# Patient Record
Sex: Female | Born: 1940
Health system: Southern US, Community
[De-identification: ages and names within clinical notes are randomized; demographics above are authoritative.]

## PROBLEM LIST (undated history)

## (undated) DIAGNOSIS — C3490 Malignant neoplasm of unspecified part of unspecified bronchus or lung: Secondary | ICD-10-CM

## (undated) DIAGNOSIS — K219 Gastro-esophageal reflux disease without esophagitis: Secondary | ICD-10-CM

## (undated) DIAGNOSIS — Z7901 Long term (current) use of anticoagulants: Secondary | ICD-10-CM

## (undated) DIAGNOSIS — M1612 Unilateral primary osteoarthritis, left hip: Secondary | ICD-10-CM

## (undated) DIAGNOSIS — G4700 Insomnia, unspecified: Secondary | ICD-10-CM

## (undated) DIAGNOSIS — M5030 Other cervical disc degeneration, unspecified cervical region: Secondary | ICD-10-CM

## (undated) DIAGNOSIS — E785 Hyperlipidemia, unspecified: Secondary | ICD-10-CM

## (undated) DIAGNOSIS — Z9221 Personal history of antineoplastic chemotherapy: Secondary | ICD-10-CM

## (undated) DIAGNOSIS — C189 Malignant neoplasm of colon, unspecified: Secondary | ICD-10-CM

## (undated) DIAGNOSIS — I4891 Unspecified atrial fibrillation: Secondary | ICD-10-CM

## (undated) DIAGNOSIS — E119 Type 2 diabetes mellitus without complications: Secondary | ICD-10-CM

## (undated) DIAGNOSIS — I471 Supraventricular tachycardia, unspecified: Secondary | ICD-10-CM

## (undated) DIAGNOSIS — F331 Major depressive disorder, recurrent, moderate: Secondary | ICD-10-CM

## (undated) DIAGNOSIS — I499 Cardiac arrhythmia, unspecified: Secondary | ICD-10-CM

## (undated) DIAGNOSIS — Z923 Personal history of irradiation: Secondary | ICD-10-CM

## (undated) DIAGNOSIS — C159 Malignant neoplasm of esophagus, unspecified: Secondary | ICD-10-CM

## (undated) DIAGNOSIS — T7840XA Allergy, unspecified, initial encounter: Secondary | ICD-10-CM

## (undated) DIAGNOSIS — Z9289 Personal history of other medical treatment: Secondary | ICD-10-CM

## (undated) DIAGNOSIS — I10 Essential (primary) hypertension: Secondary | ICD-10-CM

## (undated) DIAGNOSIS — C801 Malignant (primary) neoplasm, unspecified: Secondary | ICD-10-CM

## (undated) HISTORY — PX: ABDOMINAL HYSTERECTOMY: SHX81

## (undated) HISTORY — DX: Gastro-esophageal reflux disease without esophagitis: K21.9

## (undated) HISTORY — DX: Hyperlipidemia, unspecified: E78.5

## (undated) HISTORY — PX: LOBECTOMY: SHX5089

## (undated) HISTORY — DX: Essential (primary) hypertension: I10

## (undated) HISTORY — DX: Allergy, unspecified, initial encounter: T78.40XA

## (undated) HISTORY — PX: COLECTOMY: SHX59

## (undated) HISTORY — PX: SINUSOTOMY: SHX291

## (undated) HISTORY — DX: Malignant (primary) neoplasm, unspecified: C80.1

## (undated) HISTORY — PX: ATRIAL FIBRILLATION ABLATION: EP1191

## (undated) HISTORY — PX: PARTIAL HYSTERECTOMY: SHX80

## (undated) HISTORY — PX: OTHER SURGICAL HISTORY: SHX169

## (undated) HISTORY — DX: Major depressive disorder, recurrent, moderate: F33.1

## (undated) HISTORY — DX: Insomnia, unspecified: G47.00

## (undated) HISTORY — PX: BUNIONECTOMY: SHX129

---

## 2003-11-25 ENCOUNTER — Other Ambulatory Visit: Payer: Self-pay

## 2004-06-02 ENCOUNTER — Other Ambulatory Visit: Payer: Self-pay

## 2004-06-03 ENCOUNTER — Other Ambulatory Visit: Payer: Self-pay

## 2005-03-30 ENCOUNTER — Emergency Department: Payer: Self-pay | Admitting: Emergency Medicine

## 2005-03-30 ENCOUNTER — Other Ambulatory Visit: Payer: Self-pay

## 2005-09-20 ENCOUNTER — Ambulatory Visit: Payer: Self-pay | Admitting: Internal Medicine

## 2005-10-18 ENCOUNTER — Other Ambulatory Visit: Payer: Self-pay

## 2005-10-18 ENCOUNTER — Inpatient Hospital Stay: Payer: Self-pay | Admitting: Internal Medicine

## 2006-03-07 ENCOUNTER — Emergency Department: Payer: Self-pay | Admitting: Unknown Physician Specialty

## 2006-04-25 ENCOUNTER — Ambulatory Visit: Payer: Self-pay | Admitting: Gastroenterology

## 2007-02-28 ENCOUNTER — Ambulatory Visit: Payer: Self-pay | Admitting: Family Medicine

## 2007-05-11 ENCOUNTER — Ambulatory Visit: Payer: Self-pay | Admitting: Emergency Medicine

## 2007-05-19 ENCOUNTER — Emergency Department: Payer: Self-pay | Admitting: Emergency Medicine

## 2008-04-24 ENCOUNTER — Ambulatory Visit: Payer: Self-pay | Admitting: Family Medicine

## 2009-07-06 ENCOUNTER — Ambulatory Visit: Payer: Self-pay | Admitting: Family Medicine

## 2009-08-12 ENCOUNTER — Ambulatory Visit: Payer: Self-pay | Admitting: Gastroenterology

## 2010-01-05 ENCOUNTER — Ambulatory Visit: Payer: Self-pay | Admitting: Family Medicine

## 2010-07-15 ENCOUNTER — Ambulatory Visit: Payer: Self-pay | Admitting: Family Medicine

## 2010-07-22 ENCOUNTER — Ambulatory Visit: Payer: Self-pay | Admitting: Family Medicine

## 2011-01-17 ENCOUNTER — Ambulatory Visit: Payer: Self-pay

## 2011-01-19 ENCOUNTER — Ambulatory Visit: Payer: Self-pay | Admitting: Podiatry

## 2011-01-22 ENCOUNTER — Observation Stay: Payer: Self-pay | Admitting: Internal Medicine

## 2011-09-20 ENCOUNTER — Ambulatory Visit: Payer: Self-pay | Admitting: Family Medicine

## 2012-01-11 ENCOUNTER — Inpatient Hospital Stay: Payer: Self-pay | Admitting: Student

## 2012-01-11 ENCOUNTER — Ambulatory Visit: Payer: Self-pay | Admitting: Family Medicine

## 2012-01-11 LAB — CBC
HCT: 41.8 % (ref 35.0–47.0)
HGB: 14.1 g/dL (ref 12.0–16.0)
MCV: 96 fL (ref 80–100)
Platelet: 248 10*3/uL (ref 150–440)
RDW: 13.4 % (ref 11.5–14.5)
WBC: 7.2 10*3/uL (ref 3.6–11.0)

## 2012-01-11 LAB — COMPREHENSIVE METABOLIC PANEL
Albumin: 4 g/dL (ref 3.4–5.0)
Alkaline Phosphatase: 64 U/L (ref 50–136)
Anion Gap: 12 (ref 7–16)
BUN: 20 mg/dL — ABNORMAL HIGH (ref 7–18)
Bilirubin,Total: 0.3 mg/dL (ref 0.2–1.0)
Calcium, Total: 8.7 mg/dL (ref 8.5–10.1)
Creatinine: 0.7 mg/dL (ref 0.60–1.30)
Glucose: 110 mg/dL — ABNORMAL HIGH (ref 65–99)
Osmolality: 286 (ref 275–301)
Potassium: 3.6 mmol/L (ref 3.5–5.1)
SGPT (ALT): 26 U/L
Sodium: 142 mmol/L (ref 136–145)
Total Protein: 8 g/dL (ref 6.4–8.2)

## 2012-01-11 LAB — TROPONIN I: Troponin-I: <0.02 ng/mL

## 2012-01-11 LAB — APTT: Activated PTT: 34.6 secs (ref 23.6–35.9)

## 2012-01-11 LAB — PROTIME-INR: INR: 1

## 2012-01-12 LAB — CBC WITH DIFFERENTIAL/PLATELET
Basophil #: 0 10*3/uL (ref 0.0–0.1)
Basophil %: 0.6 %
Eosinophil #: 0.5 10*3/uL (ref 0.0–0.7)
Lymphocyte %: 36.1 %
MCH: 32.4 pg (ref 26.0–34.0)
MCHC: 33.9 g/dL (ref 32.0–36.0)
Monocyte #: 0.6 10*3/uL (ref 0.0–0.7)
Monocyte %: 10.2 %
Neutrophil %: 44 %
Platelet: 227 10*3/uL (ref 150–440)
RDW: 13.1 % (ref 11.5–14.5)
WBC: 5.9 10*3/uL (ref 3.6–11.0)

## 2012-01-12 LAB — TROPONIN I
Troponin-I: <0.02 ng/mL
Troponin-I: <0.02 ng/mL

## 2012-01-12 LAB — BASIC METABOLIC PANEL
Anion Gap: 11 (ref 7–16)
BUN: 21 mg/dL — ABNORMAL HIGH (ref 7–18)
Chloride: 102 mmol/L (ref 98–107)
Co2: 30 mmol/L (ref 21–32)
Creatinine: 0.82 mg/dL (ref 0.60–1.30)
Potassium: 3.6 mmol/L (ref 3.5–5.1)

## 2012-01-12 LAB — LIPID PANEL: HDL Cholesterol: 63 mg/dL — ABNORMAL HIGH (ref 40–60)

## 2012-09-26 ENCOUNTER — Ambulatory Visit: Payer: Self-pay | Admitting: Family Medicine

## 2012-09-28 ENCOUNTER — Ambulatory Visit: Payer: Self-pay | Admitting: Family Medicine

## 2013-08-06 ENCOUNTER — Ambulatory Visit: Payer: Self-pay | Admitting: Family Medicine

## 2013-08-16 ENCOUNTER — Ambulatory Visit: Payer: Self-pay | Admitting: Family Medicine

## 2013-10-23 ENCOUNTER — Ambulatory Visit: Payer: Self-pay | Admitting: Family Medicine

## 2014-02-25 ENCOUNTER — Ambulatory Visit: Payer: Self-pay | Admitting: Gastroenterology

## 2014-03-04 ENCOUNTER — Ambulatory Visit: Payer: Self-pay | Admitting: Gastroenterology

## 2014-03-05 LAB — PATHOLOGY REPORT

## 2014-03-06 ENCOUNTER — Ambulatory Visit: Payer: Self-pay | Admitting: Oncology

## 2014-03-07 ENCOUNTER — Ambulatory Visit: Payer: Self-pay | Admitting: Oncology

## 2014-03-10 LAB — COMPREHENSIVE METABOLIC PANEL
ALK PHOS: 70 U/L
ALT: 25 U/L (ref 12–78)
Albumin: 4 g/dL (ref 3.4–5.0)
Anion Gap: 3 — ABNORMAL LOW (ref 7–16)
BUN: 16 mg/dL (ref 7–18)
Bilirubin,Total: 0.3 mg/dL (ref 0.2–1.0)
CALCIUM: 9.1 mg/dL (ref 8.5–10.1)
CHLORIDE: 102 mmol/L (ref 98–107)
CREATININE: 0.86 mg/dL (ref 0.60–1.30)
Co2: 32 mmol/L (ref 21–32)
EGFR (African American): >60
EGFR (Non-African Amer.): >60
Glucose: 96 mg/dL (ref 65–99)
Osmolality: 275 (ref 275–301)
Potassium: 3.8 mmol/L (ref 3.5–5.1)
SGOT(AST): 28 U/L (ref 15–37)
SODIUM: 137 mmol/L (ref 136–145)
Total Protein: 8 g/dL (ref 6.4–8.2)

## 2014-03-10 LAB — CBC CANCER CENTER
BASOS ABS: 0.1 x10 3/mm (ref 0.0–0.1)
Basophil %: 1.2 %
Eosinophil #: 0.3 x10 3/mm (ref 0.0–0.7)
Eosinophil %: 4.6 %
HCT: 41.8 % (ref 35.0–47.0)
HGB: 14.1 g/dL (ref 12.0–16.0)
LYMPHS ABS: 2.4 x10 3/mm (ref 1.0–3.6)
Lymphocyte %: 32.1 %
MCH: 32.1 pg (ref 26.0–34.0)
MCHC: 33.7 g/dL (ref 32.0–36.0)
MCV: 95 fL (ref 80–100)
MONO ABS: 0.6 x10 3/mm (ref 0.2–0.9)
MONOS PCT: 7.5 %
NEUTROS ABS: 4.2 x10 3/mm (ref 1.4–6.5)
Neutrophil %: 54.6 %
PLATELETS: 252 x10 3/mm (ref 150–440)
RBC: 4.38 10*6/uL (ref 3.80–5.20)
RDW: 13.2 % (ref 11.5–14.5)
WBC: 7.6 x10 3/mm (ref 3.6–11.0)

## 2014-03-21 ENCOUNTER — Ambulatory Visit: Payer: Self-pay | Admitting: Oncology

## 2014-03-24 ENCOUNTER — Ambulatory Visit: Payer: Self-pay | Admitting: Oncology

## 2014-03-31 ENCOUNTER — Ambulatory Visit: Payer: Self-pay | Admitting: Vascular Surgery

## 2014-04-02 LAB — CBC CANCER CENTER
Basophil #: 0.1 x10 3/mm (ref 0.0–0.1)
Basophil %: 1.3 %
EOS PCT: 8.2 %
Eosinophil #: 0.5 x10 3/mm (ref 0.0–0.7)
HCT: 37.2 % (ref 35.0–47.0)
HGB: 12.5 g/dL (ref 12.0–16.0)
LYMPHS PCT: 27.7 %
Lymphocyte #: 1.7 x10 3/mm (ref 1.0–3.6)
MCH: 31.8 pg (ref 26.0–34.0)
MCHC: 33.6 g/dL (ref 32.0–36.0)
MCV: 95 fL (ref 80–100)
MONOS PCT: 9.5 %
Monocyte #: 0.6 x10 3/mm (ref 0.2–0.9)
Neutrophil #: 3.2 x10 3/mm (ref 1.4–6.5)
Neutrophil %: 53.3 %
Platelet: 241 x10 3/mm (ref 150–440)
RBC: 3.94 10*6/uL (ref 3.80–5.20)
RDW: 13 % (ref 11.5–14.5)
WBC: 6 x10 3/mm (ref 3.6–11.0)

## 2014-04-02 LAB — COMPREHENSIVE METABOLIC PANEL
ALBUMIN: 3.5 g/dL (ref 3.4–5.0)
ALT: 21 U/L (ref 12–78)
ANION GAP: 8 (ref 7–16)
Alkaline Phosphatase: 67 U/L
BILIRUBIN TOTAL: 0.3 mg/dL (ref 0.2–1.0)
BUN: 16 mg/dL (ref 7–18)
CHLORIDE: 104 mmol/L (ref 98–107)
CREATININE: 0.81 mg/dL (ref 0.60–1.30)
Calcium, Total: 8.3 mg/dL — ABNORMAL LOW (ref 8.5–10.1)
Co2: 30 mmol/L (ref 21–32)
EGFR (African American): >60
GLUCOSE: 111 mg/dL — AB (ref 65–99)
Osmolality: 285 (ref 275–301)
POTASSIUM: 3.5 mmol/L (ref 3.5–5.1)
SGOT(AST): 17 U/L (ref 15–37)
SODIUM: 142 mmol/L (ref 136–145)
Total Protein: 7 g/dL (ref 6.4–8.2)

## 2014-04-03 ENCOUNTER — Ambulatory Visit: Payer: Self-pay | Admitting: Internal Medicine

## 2014-04-09 LAB — COMPREHENSIVE METABOLIC PANEL
ALT: 23 U/L (ref 12–78)
ANION GAP: 5 — AB (ref 7–16)
Albumin: 3.4 g/dL (ref 3.4–5.0)
Alkaline Phosphatase: 63 U/L
BUN: 18 mg/dL (ref 7–18)
Bilirubin,Total: 0.4 mg/dL (ref 0.2–1.0)
CREATININE: 0.73 mg/dL (ref 0.60–1.30)
Calcium, Total: 8.8 mg/dL (ref 8.5–10.1)
Chloride: 102 mmol/L (ref 98–107)
Co2: 32 mmol/L (ref 21–32)
EGFR (African American): >60
EGFR (Non-African Amer.): >60
GLUCOSE: 103 mg/dL — AB (ref 65–99)
OSMOLALITY: 280 (ref 275–301)
POTASSIUM: 4 mmol/L (ref 3.5–5.1)
SGOT(AST): 14 U/L — ABNORMAL LOW (ref 15–37)
SODIUM: 139 mmol/L (ref 136–145)
Total Protein: 7 g/dL (ref 6.4–8.2)

## 2014-04-09 LAB — CBC CANCER CENTER
BASOS ABS: 0.1 x10 3/mm (ref 0.0–0.1)
Basophil %: 1.3 %
EOS ABS: 0.5 x10 3/mm (ref 0.0–0.7)
Eosinophil %: 10.7 %
HCT: 36.2 % (ref 35.0–47.0)
HGB: 12.4 g/dL (ref 12.0–16.0)
LYMPHS ABS: 1.5 x10 3/mm (ref 1.0–3.6)
Lymphocyte %: 35.3 %
MCH: 31.9 pg (ref 26.0–34.0)
MCHC: 34.1 g/dL (ref 32.0–36.0)
MCV: 94 fL (ref 80–100)
MONO ABS: 0.2 x10 3/mm (ref 0.2–0.9)
MONOS PCT: 5.8 %
NEUTROS ABS: 2 x10 3/mm (ref 1.4–6.5)
NEUTROS PCT: 46.9 %
PLATELETS: 227 x10 3/mm (ref 150–440)
RBC: 3.88 10*6/uL (ref 3.80–5.20)
RDW: 12.9 % (ref 11.5–14.5)
WBC: 4.3 x10 3/mm (ref 3.6–11.0)

## 2014-04-16 LAB — BASIC METABOLIC PANEL
ANION GAP: 8 (ref 7–16)
BUN: 24 mg/dL — ABNORMAL HIGH (ref 7–18)
Calcium, Total: 9 mg/dL (ref 8.5–10.1)
Chloride: 104 mmol/L (ref 98–107)
Co2: 28 mmol/L (ref 21–32)
Creatinine: 0.75 mg/dL (ref 0.60–1.30)
EGFR (African American): >60
EGFR (Non-African Amer.): >60
GLUCOSE: 110 mg/dL — AB (ref 65–99)
OSMOLALITY: 284 (ref 275–301)
POTASSIUM: 3.4 mmol/L — AB (ref 3.5–5.1)
Sodium: 140 mmol/L (ref 136–145)

## 2014-04-16 LAB — CBC WITH DIFFERENTIAL/PLATELET
BASOS ABS: 0 10*3/uL (ref 0.0–0.1)
Basophil %: 1.4 %
Eosinophil #: 0.3 10*3/uL (ref 0.0–0.7)
Eosinophil %: 9.9 %
HCT: 38 % (ref 35.0–47.0)
HGB: 12.6 g/dL (ref 12.0–16.0)
LYMPHS ABS: 1.2 10*3/uL (ref 1.0–3.6)
Lymphocyte %: 35.4 %
MCH: 31.7 pg (ref 26.0–34.0)
MCHC: 33.2 g/dL (ref 32.0–36.0)
MCV: 96 fL (ref 80–100)
MONOS PCT: 8.5 %
Monocyte #: 0.3 x10 3/mm (ref 0.2–0.9)
NEUTROS ABS: 1.5 10*3/uL (ref 1.4–6.5)
Neutrophil %: 44.8 %
PLATELETS: 229 10*3/uL (ref 150–440)
RBC: 3.98 10*6/uL (ref 3.80–5.20)
RDW: 12.8 % (ref 11.5–14.5)
WBC: 3.4 10*3/uL — ABNORMAL LOW (ref 3.6–11.0)

## 2014-04-21 ENCOUNTER — Ambulatory Visit: Payer: Self-pay | Admitting: Oncology

## 2014-04-23 LAB — CBC CANCER CENTER
Basophil #: 0 x10 3/mm (ref 0.0–0.1)
Basophil %: 0.4 %
Eosinophil #: 0 x10 3/mm (ref 0.0–0.7)
Eosinophil %: 0 %
HCT: 34.9 % — ABNORMAL LOW (ref 35.0–47.0)
HGB: 11.9 g/dL — ABNORMAL LOW (ref 12.0–16.0)
LYMPHS ABS: 0.6 x10 3/mm — AB (ref 1.0–3.6)
Lymphocyte %: 14.9 %
MCH: 32.4 pg (ref 26.0–34.0)
MCHC: 34.1 g/dL (ref 32.0–36.0)
MCV: 95 fL (ref 80–100)
MONO ABS: 0.3 x10 3/mm (ref 0.2–0.9)
Monocyte %: 6.1 %
NEUTROS ABS: 3.4 x10 3/mm (ref 1.4–6.5)
Neutrophil %: 78.6 %
PLATELETS: 221 x10 3/mm (ref 150–440)
RBC: 3.67 10*6/uL — ABNORMAL LOW (ref 3.80–5.20)
RDW: 12.7 % (ref 11.5–14.5)
WBC: 4.3 x10 3/mm (ref 3.6–11.0)

## 2014-04-23 LAB — COMPREHENSIVE METABOLIC PANEL
ALBUMIN: 3.6 g/dL (ref 3.4–5.0)
ANION GAP: 6 — AB (ref 7–16)
Alkaline Phosphatase: 69 U/L
BUN: 20 mg/dL — AB (ref 7–18)
Bilirubin,Total: 0.4 mg/dL (ref 0.2–1.0)
CREATININE: 0.75 mg/dL (ref 0.60–1.30)
Calcium, Total: 9 mg/dL (ref 8.5–10.1)
Chloride: 102 mmol/L (ref 98–107)
Co2: 32 mmol/L (ref 21–32)
EGFR (African American): >60
Glucose: 129 mg/dL — ABNORMAL HIGH (ref 65–99)
Osmolality: 284 (ref 275–301)
POTASSIUM: 3.8 mmol/L (ref 3.5–5.1)
SGOT(AST): 12 U/L — ABNORMAL LOW (ref 15–37)
SGPT (ALT): 22 U/L (ref 12–78)
SODIUM: 140 mmol/L (ref 136–145)
Total Protein: 7.2 g/dL (ref 6.4–8.2)

## 2014-04-30 LAB — COMPREHENSIVE METABOLIC PANEL
ALK PHOS: 59 U/L
AST: 13 U/L — AB (ref 15–37)
Albumin: 3.2 g/dL — ABNORMAL LOW (ref 3.4–5.0)
Anion Gap: 5 — ABNORMAL LOW (ref 7–16)
BILIRUBIN TOTAL: 0.3 mg/dL (ref 0.2–1.0)
BUN: 29 mg/dL — ABNORMAL HIGH (ref 7–18)
Calcium, Total: 8.7 mg/dL (ref 8.5–10.1)
Chloride: 103 mmol/L (ref 98–107)
Co2: 31 mmol/L (ref 21–32)
Creatinine: 0.89 mg/dL (ref 0.60–1.30)
EGFR (African American): >60
EGFR (Non-African Amer.): >60
Glucose: 95 mg/dL (ref 65–99)
OSMOLALITY: 283 (ref 275–301)
Potassium: 3.9 mmol/L (ref 3.5–5.1)
SGPT (ALT): 33 U/L (ref 12–78)
Sodium: 139 mmol/L (ref 136–145)
TOTAL PROTEIN: 6.5 g/dL (ref 6.4–8.2)

## 2014-04-30 LAB — CBC CANCER CENTER
Basophil #: 0 x10 3/mm (ref 0.0–0.1)
Basophil %: 0.1 %
EOS ABS: 0 x10 3/mm (ref 0.0–0.7)
EOS PCT: 1 %
HCT: 35.6 % (ref 35.0–47.0)
HGB: 11.8 g/dL — AB (ref 12.0–16.0)
LYMPHS PCT: 26.6 %
Lymphocyte #: 1.1 x10 3/mm (ref 1.0–3.6)
MCH: 31.9 pg (ref 26.0–34.0)
MCHC: 33.2 g/dL (ref 32.0–36.0)
MCV: 96 fL (ref 80–100)
MONO ABS: 0.9 x10 3/mm (ref 0.2–0.9)
MONOS PCT: 22.4 %
NEUTROS ABS: 2 x10 3/mm (ref 1.4–6.5)
Neutrophil %: 49.9 %
Platelet: 158 x10 3/mm (ref 150–440)
RBC: 3.7 10*6/uL — ABNORMAL LOW (ref 3.80–5.20)
RDW: 13.9 % (ref 11.5–14.5)
WBC: 4.1 x10 3/mm (ref 3.6–11.0)

## 2014-05-07 LAB — COMPREHENSIVE METABOLIC PANEL
ALBUMIN: 3.2 g/dL — AB (ref 3.4–5.0)
ALK PHOS: 63 U/L
Anion Gap: 5 — ABNORMAL LOW (ref 7–16)
BUN: 21 mg/dL — ABNORMAL HIGH (ref 7–18)
Bilirubin,Total: 0.3 mg/dL (ref 0.2–1.0)
Calcium, Total: 8.7 mg/dL (ref 8.5–10.1)
Chloride: 105 mmol/L (ref 98–107)
Co2: 32 mmol/L (ref 21–32)
Creatinine: 0.76 mg/dL (ref 0.60–1.30)
EGFR (African American): >60
EGFR (Non-African Amer.): >60
GLUCOSE: 119 mg/dL — AB (ref 65–99)
Osmolality: 287 (ref 275–301)
POTASSIUM: 3.9 mmol/L (ref 3.5–5.1)
SGOT(AST): 12 U/L — ABNORMAL LOW (ref 15–37)
SGPT (ALT): 25 U/L (ref 12–78)
SODIUM: 142 mmol/L (ref 136–145)
TOTAL PROTEIN: 6.6 g/dL (ref 6.4–8.2)

## 2014-05-07 LAB — CBC CANCER CENTER
BASOS PCT: 1 %
Basophil #: 0 x10 3/mm (ref 0.0–0.1)
EOS ABS: 0.1 x10 3/mm (ref 0.0–0.7)
Eosinophil %: 2.2 %
HCT: 34.7 % — ABNORMAL LOW (ref 35.0–47.0)
HGB: 11.8 g/dL — AB (ref 12.0–16.0)
Lymphocyte #: 0.5 x10 3/mm — ABNORMAL LOW (ref 1.0–3.6)
Lymphocyte %: 16.8 %
MCH: 32.9 pg (ref 26.0–34.0)
MCHC: 34.1 g/dL (ref 32.0–36.0)
MCV: 97 fL (ref 80–100)
Monocyte #: 0.3 x10 3/mm (ref 0.2–0.9)
Monocyte %: 10.4 %
Neutrophil #: 2 x10 3/mm (ref 1.4–6.5)
Neutrophil %: 69.6 %
Platelet: 115 x10 3/mm — ABNORMAL LOW (ref 150–440)
RBC: 3.6 10*6/uL — AB (ref 3.80–5.20)
RDW: 13.9 % (ref 11.5–14.5)
WBC: 2.8 x10 3/mm — AB (ref 3.6–11.0)

## 2014-05-14 LAB — CBC CANCER CENTER
Basophil #: 0 x10 3/mm (ref 0.0–0.1)
Basophil %: 0.6 %
EOS ABS: 0.1 x10 3/mm (ref 0.0–0.7)
Eosinophil %: 2.4 %
HCT: 34.7 % — AB (ref 35.0–47.0)
HGB: 11.9 g/dL — AB (ref 12.0–16.0)
Lymphocyte #: 0.8 x10 3/mm — ABNORMAL LOW (ref 1.0–3.6)
Lymphocyte %: 24.3 %
MCH: 32.6 pg (ref 26.0–34.0)
MCHC: 34.3 g/dL (ref 32.0–36.0)
MCV: 95 fL (ref 80–100)
MONOS PCT: 8.3 %
Monocyte #: 0.3 x10 3/mm (ref 0.2–0.9)
NEUTROS ABS: 2.1 x10 3/mm (ref 1.4–6.5)
NEUTROS PCT: 64.4 %
PLATELETS: 161 x10 3/mm (ref 150–440)
RBC: 3.65 10*6/uL — ABNORMAL LOW (ref 3.80–5.20)
RDW: 14.3 % (ref 11.5–14.5)
WBC: 3.2 x10 3/mm — AB (ref 3.6–11.0)

## 2014-05-14 LAB — COMPREHENSIVE METABOLIC PANEL
ANION GAP: 7 (ref 7–16)
Albumin: 3.4 g/dL (ref 3.4–5.0)
Alkaline Phosphatase: 58 U/L
BILIRUBIN TOTAL: 0.4 mg/dL (ref 0.2–1.0)
BUN: 20 mg/dL — ABNORMAL HIGH (ref 7–18)
CHLORIDE: 101 mmol/L (ref 98–107)
Calcium, Total: 9.1 mg/dL (ref 8.5–10.1)
Co2: 29 mmol/L (ref 21–32)
Creatinine: 0.94 mg/dL (ref 0.60–1.30)
EGFR (African American): >60
EGFR (Non-African Amer.): >60
Glucose: 119 mg/dL — ABNORMAL HIGH (ref 65–99)
Osmolality: 278 (ref 275–301)
POTASSIUM: 3.3 mmol/L — AB (ref 3.5–5.1)
SGOT(AST): 18 U/L (ref 15–37)
SGPT (ALT): 24 U/L (ref 12–78)
Sodium: 137 mmol/L (ref 136–145)
Total Protein: 6.8 g/dL (ref 6.4–8.2)

## 2014-05-21 ENCOUNTER — Ambulatory Visit: Payer: Self-pay | Admitting: Oncology

## 2014-05-21 LAB — COMPREHENSIVE METABOLIC PANEL
ALT: 26 U/L (ref 12–78)
Albumin: 3.5 g/dL (ref 3.4–5.0)
Alkaline Phosphatase: 57 U/L
Anion Gap: 8 (ref 7–16)
BUN: 34 mg/dL — ABNORMAL HIGH (ref 7–18)
Bilirubin,Total: 0.3 mg/dL (ref 0.2–1.0)
Calcium, Total: 9 mg/dL (ref 8.5–10.1)
Chloride: 100 mmol/L (ref 98–107)
Co2: 29 mmol/L (ref 21–32)
Creatinine: 1.18 mg/dL (ref 0.60–1.30)
EGFR (African American): 53 — ABNORMAL LOW
EGFR (Non-African Amer.): 46 — ABNORMAL LOW
Glucose: 108 mg/dL — ABNORMAL HIGH (ref 65–99)
Osmolality: 282 (ref 275–301)
Potassium: 4.1 mmol/L (ref 3.5–5.1)
SGOT(AST): 13 U/L — ABNORMAL LOW (ref 15–37)
Sodium: 137 mmol/L (ref 136–145)
Total Protein: 6.9 g/dL (ref 6.4–8.2)

## 2014-05-21 LAB — CBC CANCER CENTER
Basophil #: 0 x10 3/mm (ref 0.0–0.1)
Basophil %: 0.1 %
Eosinophil #: 0 x10 3/mm (ref 0.0–0.7)
Eosinophil %: 0 %
HCT: 33.5 % — AB (ref 35.0–47.0)
HGB: 11.5 g/dL — ABNORMAL LOW (ref 12.0–16.0)
Lymphocyte #: 0.3 x10 3/mm — ABNORMAL LOW (ref 1.0–3.6)
Lymphocyte %: 6.8 %
MCH: 32.9 pg (ref 26.0–34.0)
MCHC: 34.2 g/dL (ref 32.0–36.0)
MCV: 96 fL (ref 80–100)
Monocyte #: 0.5 x10 3/mm (ref 0.2–0.9)
Monocyte %: 14.4 %
Neutrophil #: 3 x10 3/mm (ref 1.4–6.5)
Neutrophil %: 78.7 %
Platelet: 223 x10 3/mm (ref 150–440)
RBC: 3.48 10*6/uL — ABNORMAL LOW (ref 3.80–5.20)
RDW: 14.8 % — AB (ref 11.5–14.5)
WBC: 3.8 x10 3/mm (ref 3.6–11.0)

## 2014-05-24 ENCOUNTER — Emergency Department: Payer: Self-pay | Admitting: Emergency Medicine

## 2014-05-24 LAB — COMPREHENSIVE METABOLIC PANEL
ALK PHOS: 66 U/L
ANION GAP: 9 (ref 7–16)
Albumin: 3.4 g/dL (ref 3.4–5.0)
BILIRUBIN TOTAL: 0.5 mg/dL (ref 0.2–1.0)
BUN: 30 mg/dL — ABNORMAL HIGH (ref 7–18)
Calcium, Total: 8.5 mg/dL (ref 8.5–10.1)
Chloride: 100 mmol/L (ref 98–107)
Co2: 25 mmol/L (ref 21–32)
Creatinine: 1.1 mg/dL (ref 0.60–1.30)
EGFR (African American): 58 — ABNORMAL LOW
GFR CALC NON AF AMER: 50 — AB
Glucose: 115 mg/dL — ABNORMAL HIGH (ref 65–99)
Osmolality: 275 (ref 275–301)
Potassium: 3.7 mmol/L (ref 3.5–5.1)
SGOT(AST): 26 U/L (ref 15–37)
SGPT (ALT): 30 U/L (ref 12–78)
Sodium: 134 mmol/L — ABNORMAL LOW (ref 136–145)
Total Protein: 7 g/dL (ref 6.4–8.2)

## 2014-05-24 LAB — CBC
HCT: 33.4 % — AB (ref 35.0–47.0)
HGB: 11.4 g/dL — ABNORMAL LOW (ref 12.0–16.0)
MCH: 33.7 pg (ref 26.0–34.0)
MCHC: 34.2 g/dL (ref 32.0–36.0)
MCV: 98 fL (ref 80–100)
PLATELETS: 129 10*3/uL — AB (ref 150–440)
RBC: 3.39 10*6/uL — AB (ref 3.80–5.20)
RDW: 14.8 % — ABNORMAL HIGH (ref 11.5–14.5)
WBC: 4.3 10*3/uL (ref 3.6–11.0)

## 2014-05-24 LAB — TROPONIN I: Troponin-I: <0.02 ng/mL

## 2014-05-28 LAB — COMPREHENSIVE METABOLIC PANEL
Albumin: 3.4 g/dL (ref 3.4–5.0)
Alkaline Phosphatase: 65 U/L
Anion Gap: 7 (ref 7–16)
BILIRUBIN TOTAL: 0.4 mg/dL (ref 0.2–1.0)
BUN: 28 mg/dL — ABNORMAL HIGH (ref 7–18)
CALCIUM: 8.7 mg/dL (ref 8.5–10.1)
CREATININE: 1.29 mg/dL (ref 0.60–1.30)
Chloride: 100 mmol/L (ref 98–107)
Co2: 32 mmol/L (ref 21–32)
EGFR (Non-African Amer.): 41 — ABNORMAL LOW
GFR CALC AF AMER: 47 — AB
Glucose: 119 mg/dL — ABNORMAL HIGH (ref 65–99)
OSMOLALITY: 284 (ref 275–301)
Potassium: 3.5 mmol/L (ref 3.5–5.1)
SGOT(AST): 15 U/L (ref 15–37)
SGPT (ALT): 30 U/L (ref 12–78)
Sodium: 139 mmol/L (ref 136–145)
TOTAL PROTEIN: 6.9 g/dL (ref 6.4–8.2)

## 2014-05-28 LAB — CBC CANCER CENTER
BASOS PCT: 0.1 %
Basophil #: 0 x10 3/mm (ref 0.0–0.1)
EOS ABS: 0 x10 3/mm (ref 0.0–0.7)
EOS PCT: 0.1 %
HCT: 34.3 % — ABNORMAL LOW (ref 35.0–47.0)
HGB: 11.8 g/dL — ABNORMAL LOW (ref 12.0–16.0)
LYMPHS ABS: 0.4 x10 3/mm — AB (ref 1.0–3.6)
Lymphocyte %: 8.4 %
MCH: 33.3 pg (ref 26.0–34.0)
MCHC: 34.3 g/dL (ref 32.0–36.0)
MCV: 97 fL (ref 80–100)
MONOS PCT: 20 %
Monocyte #: 1 x10 3/mm — ABNORMAL HIGH (ref 0.2–0.9)
NEUTROS PCT: 71.4 %
Neutrophil #: 3.6 x10 3/mm (ref 1.4–6.5)
Platelet: 169 x10 3/mm (ref 150–440)
RBC: 3.54 10*6/uL — ABNORMAL LOW (ref 3.80–5.20)
RDW: 15.8 % — ABNORMAL HIGH (ref 11.5–14.5)
WBC: 5.1 x10 3/mm (ref 3.6–11.0)

## 2014-06-04 LAB — CBC CANCER CENTER
Basophil #: 0 x10 3/mm (ref 0.0–0.1)
Basophil %: 0.7 %
EOS ABS: 0 x10 3/mm (ref 0.0–0.7)
Eosinophil %: 0.6 %
HCT: 33.7 % — ABNORMAL LOW (ref 35.0–47.0)
HGB: 11.4 g/dL — AB (ref 12.0–16.0)
LYMPHS ABS: 0.6 x10 3/mm — AB (ref 1.0–3.6)
Lymphocyte %: 9.5 %
MCH: 33.3 pg (ref 26.0–34.0)
MCHC: 33.8 g/dL (ref 32.0–36.0)
MCV: 99 fL (ref 80–100)
MONO ABS: 0.8 x10 3/mm (ref 0.2–0.9)
MONOS PCT: 13.9 %
NEUTROS ABS: 4.6 x10 3/mm (ref 1.4–6.5)
NEUTROS PCT: 75.3 %
PLATELETS: 169 x10 3/mm (ref 150–440)
RBC: 3.42 10*6/uL — ABNORMAL LOW (ref 3.80–5.20)
RDW: 17 % — AB (ref 11.5–14.5)
WBC: 6.1 x10 3/mm (ref 3.6–11.0)

## 2014-06-04 LAB — COMPREHENSIVE METABOLIC PANEL
ANION GAP: 9 (ref 7–16)
Albumin: 2.9 g/dL — ABNORMAL LOW (ref 3.4–5.0)
Alkaline Phosphatase: 63 U/L
BUN: 13 mg/dL (ref 7–18)
Bilirubin,Total: 0.4 mg/dL (ref 0.2–1.0)
CO2: 28 mmol/L (ref 21–32)
Calcium, Total: 8.3 mg/dL — ABNORMAL LOW (ref 8.5–10.1)
Chloride: 107 mmol/L (ref 98–107)
Creatinine: 0.73 mg/dL (ref 0.60–1.30)
EGFR (African American): >60
GLUCOSE: 112 mg/dL — AB (ref 65–99)
Osmolality: 288 (ref 275–301)
POTASSIUM: 3.2 mmol/L — AB (ref 3.5–5.1)
SGOT(AST): 15 U/L (ref 15–37)
SGPT (ALT): 22 U/L (ref 12–78)
Sodium: 144 mmol/L (ref 136–145)
Total Protein: 6.2 g/dL — ABNORMAL LOW (ref 6.4–8.2)

## 2014-06-09 LAB — CBC CANCER CENTER
BASOS ABS: 0 x10 3/mm (ref 0.0–0.1)
BASOS PCT: 0.5 %
EOS PCT: 0.8 %
Eosinophil #: 0 x10 3/mm (ref 0.0–0.7)
HCT: 32.4 % — ABNORMAL LOW (ref 35.0–47.0)
HGB: 10.8 g/dL — ABNORMAL LOW (ref 12.0–16.0)
LYMPHS ABS: 0.7 x10 3/mm — AB (ref 1.0–3.6)
Lymphocyte %: 13 %
MCH: 32.7 pg (ref 26.0–34.0)
MCHC: 33.3 g/dL (ref 32.0–36.0)
MCV: 98 fL (ref 80–100)
MONO ABS: 0.7 x10 3/mm (ref 0.2–0.9)
Monocyte %: 11.8 %
NEUTROS ABS: 4.1 x10 3/mm (ref 1.4–6.5)
Neutrophil %: 73.9 %
PLATELETS: 252 x10 3/mm (ref 150–440)
RBC: 3.3 10*6/uL — ABNORMAL LOW (ref 3.80–5.20)
RDW: 18.2 % — AB (ref 11.5–14.5)
WBC: 5.5 x10 3/mm (ref 3.6–11.0)

## 2014-06-09 LAB — COMPREHENSIVE METABOLIC PANEL
Albumin: 2.9 g/dL — ABNORMAL LOW (ref 3.4–5.0)
Alkaline Phosphatase: 63 U/L
Anion Gap: 5 — ABNORMAL LOW (ref 7–16)
BUN: 9 mg/dL (ref 7–18)
Bilirubin,Total: 0.4 mg/dL (ref 0.2–1.0)
CALCIUM: 8.5 mg/dL (ref 8.5–10.1)
CO2: 32 mmol/L (ref 21–32)
Chloride: 104 mmol/L (ref 98–107)
Creatinine: 0.66 mg/dL (ref 0.60–1.30)
EGFR (African American): >60
Glucose: 112 mg/dL — ABNORMAL HIGH (ref 65–99)
Osmolality: 281 (ref 275–301)
POTASSIUM: 3.4 mmol/L — AB (ref 3.5–5.1)
SGOT(AST): 15 U/L (ref 15–37)
SGPT (ALT): 20 U/L (ref 12–78)
SODIUM: 141 mmol/L (ref 136–145)
TOTAL PROTEIN: 6.3 g/dL — AB (ref 6.4–8.2)

## 2014-06-21 ENCOUNTER — Ambulatory Visit: Payer: Self-pay | Admitting: Oncology

## 2014-06-23 LAB — COMPREHENSIVE METABOLIC PANEL
ALBUMIN: 3.5 g/dL (ref 3.4–5.0)
ANION GAP: 7 (ref 7–16)
AST: 17 U/L (ref 15–37)
Alkaline Phosphatase: 63 U/L
BUN: 15 mg/dL (ref 7–18)
Bilirubin,Total: 0.4 mg/dL (ref 0.2–1.0)
CREATININE: 0.92 mg/dL (ref 0.60–1.30)
Calcium, Total: 9 mg/dL (ref 8.5–10.1)
Chloride: 102 mmol/L (ref 98–107)
Co2: 31 mmol/L (ref 21–32)
EGFR (African American): >60
Glucose: 104 mg/dL — ABNORMAL HIGH (ref 65–99)
OSMOLALITY: 281 (ref 275–301)
POTASSIUM: 3.9 mmol/L (ref 3.5–5.1)
SGPT (ALT): 21 U/L
Sodium: 140 mmol/L (ref 136–145)
TOTAL PROTEIN: 7.1 g/dL (ref 6.4–8.2)

## 2014-06-23 LAB — CBC CANCER CENTER
BASOS ABS: 0 x10 3/mm (ref 0.0–0.1)
BASOS PCT: 1.1 %
EOS ABS: 0.1 x10 3/mm (ref 0.0–0.7)
Eosinophil %: 3 %
HCT: 35.9 % (ref 35.0–47.0)
HGB: 12 g/dL (ref 12.0–16.0)
Lymphocyte #: 1 x10 3/mm (ref 1.0–3.6)
Lymphocyte %: 24 %
MCH: 33.6 pg (ref 26.0–34.0)
MCHC: 33.4 g/dL (ref 32.0–36.0)
MCV: 101 fL — ABNORMAL HIGH (ref 80–100)
MONOS PCT: 13.1 %
Monocyte #: 0.5 x10 3/mm (ref 0.2–0.9)
NEUTROS ABS: 2.4 x10 3/mm (ref 1.4–6.5)
NEUTROS PCT: 58.8 %
Platelet: 256 x10 3/mm (ref 150–440)
RBC: 3.57 10*6/uL — AB (ref 3.80–5.20)
RDW: 17.4 % — ABNORMAL HIGH (ref 11.5–14.5)
WBC: 4.1 x10 3/mm (ref 3.6–11.0)

## 2014-06-25 DIAGNOSIS — C159 Malignant neoplasm of esophagus, unspecified: Secondary | ICD-10-CM | POA: Insufficient documentation

## 2014-07-08 ENCOUNTER — Ambulatory Visit: Payer: Self-pay | Admitting: Gastroenterology

## 2014-07-10 LAB — PATHOLOGY REPORT

## 2014-07-15 LAB — COMPREHENSIVE METABOLIC PANEL
ALK PHOS: 88 U/L
AST: 19 U/L (ref 15–37)
Albumin: 3.3 g/dL — ABNORMAL LOW (ref 3.4–5.0)
Anion Gap: 10 (ref 7–16)
BUN: 9 mg/dL (ref 7–18)
Bilirubin,Total: 0.4 mg/dL (ref 0.2–1.0)
Calcium, Total: 8.7 mg/dL (ref 8.5–10.1)
Chloride: 100 mmol/L (ref 98–107)
Co2: 28 mmol/L (ref 21–32)
Creatinine: 0.84 mg/dL (ref 0.60–1.30)
EGFR (African American): >60
EGFR (Non-African Amer.): >60
GLUCOSE: 94 mg/dL (ref 65–99)
Osmolality: 274 (ref 275–301)
POTASSIUM: 3.6 mmol/L (ref 3.5–5.1)
SGPT (ALT): 21 U/L
SODIUM: 138 mmol/L (ref 136–145)
Total Protein: 7.2 g/dL (ref 6.4–8.2)

## 2014-07-15 LAB — CBC CANCER CENTER
BASOS PCT: 0.8 %
Basophil #: 0 x10 3/mm (ref 0.0–0.1)
EOS PCT: 8.2 %
Eosinophil #: 0.5 x10 3/mm (ref 0.0–0.7)
HCT: 35.6 % (ref 35.0–47.0)
HGB: 11.8 g/dL — ABNORMAL LOW (ref 12.0–16.0)
LYMPHS ABS: 1.7 x10 3/mm (ref 1.0–3.6)
LYMPHS PCT: 26.7 %
MCH: 33.1 pg (ref 26.0–34.0)
MCHC: 33.1 g/dL (ref 32.0–36.0)
MCV: 100 fL (ref 80–100)
MONO ABS: 0.8 x10 3/mm (ref 0.2–0.9)
Monocyte %: 12 %
NEUTROS PCT: 52.3 %
Neutrophil #: 3.3 x10 3/mm (ref 1.4–6.5)
PLATELETS: 329 x10 3/mm (ref 150–440)
RBC: 3.56 10*6/uL — ABNORMAL LOW (ref 3.80–5.20)
RDW: 15.1 % — ABNORMAL HIGH (ref 11.5–14.5)
WBC: 6.3 x10 3/mm (ref 3.6–11.0)

## 2014-07-22 ENCOUNTER — Ambulatory Visit: Payer: Self-pay | Admitting: Oncology

## 2014-08-15 LAB — COMPREHENSIVE METABOLIC PANEL
ALT: 24 U/L
Albumin: 3.6 g/dL (ref 3.4–5.0)
Alkaline Phosphatase: 81 U/L
Anion Gap: 5 — ABNORMAL LOW (ref 7–16)
BILIRUBIN TOTAL: 0.3 mg/dL (ref 0.2–1.0)
BUN: 13 mg/dL (ref 7–18)
CALCIUM: 9 mg/dL (ref 8.5–10.1)
CHLORIDE: 100 mmol/L (ref 98–107)
Co2: 31 mmol/L (ref 21–32)
Creatinine: 0.9 mg/dL (ref 0.60–1.30)
EGFR (Non-African Amer.): >60
Glucose: 92 mg/dL (ref 65–99)
OSMOLALITY: 272 (ref 275–301)
Potassium: 3.9 mmol/L (ref 3.5–5.1)
SGOT(AST): 20 U/L (ref 15–37)
SODIUM: 136 mmol/L (ref 136–145)
TOTAL PROTEIN: 6.9 g/dL (ref 6.4–8.2)

## 2014-08-15 LAB — CBC CANCER CENTER
BASOS ABS: 0 x10 3/mm (ref 0.0–0.1)
Basophil %: 0.8 %
EOS PCT: 7.5 %
Eosinophil #: 0.4 x10 3/mm (ref 0.0–0.7)
HCT: 38.1 % (ref 35.0–47.0)
HGB: 12.5 g/dL (ref 12.0–16.0)
Lymphocyte #: 2.2 x10 3/mm (ref 1.0–3.6)
Lymphocyte %: 39 %
MCH: 32.3 pg (ref 26.0–34.0)
MCHC: 32.8 g/dL (ref 32.0–36.0)
MCV: 99 fL (ref 80–100)
Monocyte #: 0.6 x10 3/mm (ref 0.2–0.9)
Monocyte %: 10.6 %
Neutrophil #: 2.4 x10 3/mm (ref 1.4–6.5)
Neutrophil %: 42.1 %
Platelet: 280 x10 3/mm (ref 150–440)
RBC: 3.87 10*6/uL (ref 3.80–5.20)
RDW: 13.3 % (ref 11.5–14.5)
WBC: 5.6 x10 3/mm (ref 3.6–11.0)

## 2014-08-21 ENCOUNTER — Ambulatory Visit: Payer: Self-pay | Admitting: Oncology

## 2014-09-18 DIAGNOSIS — I4891 Unspecified atrial fibrillation: Secondary | ICD-10-CM | POA: Insufficient documentation

## 2014-09-21 ENCOUNTER — Ambulatory Visit: Payer: Self-pay | Admitting: Oncology

## 2014-09-25 ENCOUNTER — Ambulatory Visit: Payer: Self-pay | Admitting: Family Medicine

## 2014-10-14 ENCOUNTER — Ambulatory Visit: Payer: Self-pay | Admitting: Gastroenterology

## 2014-10-20 ENCOUNTER — Ambulatory Visit: Payer: Self-pay | Admitting: Family Medicine

## 2014-10-21 ENCOUNTER — Ambulatory Visit: Payer: Self-pay | Admitting: Oncology

## 2014-10-30 LAB — CBC CANCER CENTER
BASOS PCT: 1.8 %
Basophil #: 0.1 x10 3/mm (ref 0.0–0.1)
Eosinophil #: 0.3 x10 3/mm (ref 0.0–0.7)
Eosinophil %: 8.2 %
HCT: 38.8 % (ref 35.0–47.0)
HGB: 12.8 g/dL (ref 12.0–16.0)
Lymphocyte #: 1.5 x10 3/mm (ref 1.0–3.6)
Lymphocyte %: 37.2 %
MCH: 30.9 pg (ref 26.0–34.0)
MCHC: 33 g/dL (ref 32.0–36.0)
MCV: 94 fL (ref 80–100)
Monocyte #: 0.5 x10 3/mm (ref 0.2–0.9)
Monocyte %: 11.5 %
NEUTROS ABS: 1.6 x10 3/mm (ref 1.4–6.5)
Neutrophil %: 41.3 %
PLATELETS: 220 x10 3/mm (ref 150–440)
RBC: 4.15 10*6/uL (ref 3.80–5.20)
RDW: 14 % (ref 11.5–14.5)
WBC: 4 x10 3/mm (ref 3.6–11.0)

## 2014-10-30 LAB — COMPREHENSIVE METABOLIC PANEL
ANION GAP: 8 (ref 7–16)
AST: 17 U/L (ref 15–37)
Albumin: 3.6 g/dL (ref 3.4–5.0)
Alkaline Phosphatase: 70 U/L
BUN: 16 mg/dL (ref 7–18)
Bilirubin,Total: 0.4 mg/dL (ref 0.2–1.0)
CHLORIDE: 103 mmol/L (ref 98–107)
CREATININE: 0.95 mg/dL (ref 0.60–1.30)
Calcium, Total: 8.5 mg/dL (ref 8.5–10.1)
Co2: 31 mmol/L (ref 21–32)
Glucose: 100 mg/dL — ABNORMAL HIGH (ref 65–99)
Osmolality: 284 (ref 275–301)
POTASSIUM: 3.4 mmol/L — AB (ref 3.5–5.1)
SGPT (ALT): 22 U/L
Sodium: 142 mmol/L (ref 136–145)
Total Protein: 6.7 g/dL (ref 6.4–8.2)

## 2014-11-03 DIAGNOSIS — M47817 Spondylosis without myelopathy or radiculopathy, lumbosacral region: Secondary | ICD-10-CM | POA: Insufficient documentation

## 2014-11-04 ENCOUNTER — Ambulatory Visit: Payer: Self-pay | Admitting: Family Medicine

## 2014-11-21 ENCOUNTER — Ambulatory Visit: Payer: Self-pay | Admitting: Oncology

## 2015-02-18 ENCOUNTER — Ambulatory Visit
Admit: 2015-02-18 | Disposition: A | Discharge status: Home / Self Care | Payer: Self-pay | Attending: Oncology | Admitting: Oncology

## 2015-02-18 LAB — COMPREHENSIVE METABOLIC PANEL
Albumin: 4.1 g/dL
Alkaline Phosphatase: 70 U/L
Anion Gap: 5 — ABNORMAL LOW (ref 7–16)
BUN: 19 mg/dL
Bilirubin,Total: 0.5 mg/dL
CALCIUM: 9 mg/dL
CHLORIDE: 101 mmol/L
CO2: 31 mmol/L
Creatinine: 0.95 mg/dL
EGFR (African American): >60
GFR CALC NON AF AMER: 59 — AB
Glucose: 91 mg/dL
POTASSIUM: 3.8 mmol/L
SGOT(AST): 23 U/L
SGPT (ALT): 18 U/L
SODIUM: 137 mmol/L
TOTAL PROTEIN: 7 g/dL

## 2015-02-18 LAB — CBC CANCER CENTER
BASOS PCT: 1.2 %
Basophil #: 0.1 x10 3/mm (ref 0.0–0.1)
EOS PCT: 9.4 %
Eosinophil #: 0.4 x10 3/mm (ref 0.0–0.7)
HCT: 38.3 % (ref 35.0–47.0)
HGB: 13 g/dL (ref 12.0–16.0)
Lymphocyte #: 1.4 x10 3/mm (ref 1.0–3.6)
Lymphocyte %: 30.3 %
MCH: 32.7 pg (ref 26.0–34.0)
MCHC: 34.1 g/dL (ref 32.0–36.0)
MCV: 96 fL (ref 80–100)
MONOS PCT: 9.4 %
Monocyte #: 0.4 x10 3/mm (ref 0.2–0.9)
Neutrophil #: 2.3 x10 3/mm (ref 1.4–6.5)
Neutrophil %: 49.7 %
PLATELETS: 231 x10 3/mm (ref 150–440)
RBC: 3.99 10*6/uL (ref 3.80–5.20)
RDW: 13.1 % (ref 11.5–14.5)
WBC: 4.6 x10 3/mm (ref 3.6–11.0)

## 2015-02-20 ENCOUNTER — Ambulatory Visit
Admit: 2015-02-20 | Disposition: A | Discharge status: Home / Self Care | Payer: Self-pay | Attending: Oncology | Admitting: Oncology

## 2015-02-25 ENCOUNTER — Ambulatory Visit
Admit: 2015-02-25 | Disposition: A | Discharge status: Home / Self Care | Payer: Self-pay | Attending: Oncology | Admitting: Oncology
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
## 2015-03-14 NOTE — Op Note (Signed)
PATIENT NAME:  Kelly Krause, Kelly Krause MR#:  [id number] DATE OF BIRTH:  [birth year]  DATE OF PROCEDURE:  03/31/2014  PREOPERATIVE DIAGNOSIS:  Esophageal cancer.  POSTOPERATIVE DIAGNOSIS:  Esophageal cancer.   PROCEDURES:  1.  Ultrasound guidance for vascular access, right internal jugular vein.  2.  Fluoroscopic guidance for placement of catheter.  3.  Placement of CT compatible Port-A-Cath, right internal jugular vein.   SURGEON:  Leotis Pain, MD   ANESTHESIA:  Local with moderate conscious sedation.   FLUOROSCOPY TIME:  Less than 1 minute.   CONTRAST:  Zero.   ESTIMATED BLOOD LOSS:  25 mL.   INDICATION FOR PROCEDURE:  INDICATION FOR PROCEDURE: This is an individual with esophageal cancer needs a Port-A-Cath for chemotherapy and durable venous access.  Risks and benefits were discussed. Informed consent was obtained.   DESCRIPTION OF THE PROCEDURE:  The patient was brought to the vascular and interventional radiology suite. The right neck and chest were sterilely prepped and draped, and a sterile surgical field was created. Ultrasound was used to help visualize a patent right internal jugular vein. This was then accessed under direct ultrasound guidance without difficulty with a Seldinger needle and a permanent image was recorded. A J-wire was placed. After skin nick and dilatation, the peel-away sheath was then placed over the wire. I then anesthetized an area under the clavicle approximately 2 fingerbreadths. A transverse incision was created and an inferior pocket was created with electrocautery and blunt dissection. The port was then brought onto the field, placed into the pocket and secured to the chest wall with 2 Prolene sutures. The catheter was connected to the port and tunneled from the subclavicular incision to the access site. Fluoroscopic guidance was used to cut the catheter to an appropriate length. The catheter was then placed through the peel-away sheath and the peel-away sheath was  removed. The catheter tip was parked in excellent location in the distal superior vena cava. The pocket was then irrigated with antibiotic-impregnated saline and the wound was closed with a running 3-0 Vicryl and a 4-0 Monocryl. The access incision was closed with a single 4-0 Monocryl. The Huber needle was used to withdraw blood and flush the port with heparinized saline. Dermabond was then placed as a dressing. The patient tolerated the procedure well and was taken to the recovery room in stable condition.    ____________________________ Algernon Huxley, MD jsd:ce D: 03/31/2014 13:28:37 ET T: 03/31/2014 14:11:12 ET JOB#: [id number]  cc: Algernon Huxley, MD, <Dictator> Algernon Huxley MD ELECTRONICALLY SIGNED 04/03/2014 15:33

## 2015-03-15 NOTE — H&P (Signed)
PATIENT NAME:  Kelly Krause, Kelly Krause MR#:  [id number] DATE OF BIRTH:  1941/05/08  DATE OF ADMISSION:  01/11/2012  PRIMARY CARE PHYSICIAN: Dr. Otilio Miu   CHIEF COMPLAINT: "My whole left side feels numb."  HISTORY OF PRESENT ILLNESS: 74 year old female with history of hypertension, hyperlipidemia, previous history of transient ischemic attack, she had a history of cardiac arrhythmia status post ablation at [facility] in 2005 on sotalol, history of colon cancer with right lung metastasis status post surgery 98. Today she presented to [facility] because she said she started having numbness in the left side after lunch, around 2:00 p.m. She said the whole left side feels numb. She describes this as the skin feels thick as if she has gone to a dentist and everything is numb. The numbness is more pronounced in the facial and the scalp area and then in the legs and the foot and in the upper extremity. She denies any weakness of the left side. She denies any change in speech. She said she has slight blurred vision on the left side. No difficulty swallowing. She was slightly dizzy but she denies any stumbling or off balance. Her blood pressure when she reached the urgent care was around 207/93. She received 325 mg of aspirin at urgent care and she was referred to ER. Initial CT of the head has been negative for acute bleed or ischemia. She said she had previous transient ischemic attacks multiple times and she had numbness during her previous transient ischemic attacks also it looks like a mini stroke to her. She also has some cervical disk disease and she has some sciatica on the right side. She denies any headache and she does not take any antiplatelet agent, aspirin, Plavix or Aggrenox, at home. She is being admitted for left-sided numbness, suspect transient ischemic attack. She had some right rib pain or right upper quadrant pain for last four days but that has improved.   REVIEW OF SYSTEMS: She denies any  fever, fatigue or weak. She is complaining of some blurred vision on the left side. She denies any hearing loss. No cough. No dyspnea. No chest pain. No dyspnea on exertion. She was slightly nauseous before. No abdominal pain. No GI bleed. No dysuria. No frequency. No thyroid problems. No anemia. No rash. She has some sciatica problem on the right side, right hip area. She is complaining of numbness now. She had history of transient ischemic attack in the past. No anxiety or depression.   PAST MEDICAL HISTORY:  1. Hypertension. 2. Hyperlipidemia. 3. Previous history of transient ischemic attack.  4. History of cardiac arrhythmia. She had cardiac ablation surgery at [facility] because of supraventricular tachycardia currently on sotalol.  5. History of colon cancer with right lung metastasis. She underwent colon resection and right lung segmental resection and she underwent chemotherapy and radiation therapy with no recurrence; this was in 1998.   PAST SURGICAL HISTORY:  1. Colon and lung resection. 2. Hysterectomy.   ALLERGIES: Acetaminophen, codeine, Demerol, tramadol, strawberry, latex.   HOME MEDICATIONS:  1. Ramipril 10 mg daily.  2. Amlodipine 5 mg daily.  3. Hydrochlorothiazide 12.5 mg daily.  4. Simvastatin 40 mg at bedtime.  5. Loratadine 10 mg daily.  6. Sotalol 80 mg b.i.d.  7. Omeprazole 20 mg daily.  8. Bupropion 150 mg daily.   SOCIAL HISTORY: She lives with her husband. No smoking. Occasional alcohol use. No drug use.   FAMILY HISTORY: Father died of stroke at the age of  36. Mother died of stroke at age 15. Also history of hypertension in the family.   PHYSICAL EXAMINATION:  VITAL SIGNS: Temperature 96.7, heart rate 53, respiratory rate 20, blood pressure 157/67, saturating 98% on room air. Her vitals at [facility] include blood pressure 207/93.   GENERAL: This is an elderly Caucasian female, well built, complaining of numbness on the left side.   HEENT: Bilateral  pupils are equal and reactive to light. Extraocular muscles intact. No scleral icterus. No conjunctivitis. Oral mucosa is moist. No pallor.   NECK: No thyroid tenderness, enlargement or nodules. Neck is supple. No masses, nontender. No adenopathy. No JVD. No carotid bruit.   CHEST: Bilateral breath sounds are clear. No wheeze. Normal effort. No respiratory distress.   HEART: Heart sounds are regular. No murmur. Good peripheral pulses. No lower extremity edema.   ABDOMEN: Soft, nontender. Normal bowel sounds. No hepatosplenomegaly. No bruits. No masses.   RECTAL: Deferred.   NEUROLOGIC: She is awake, alert. She is oriented to time, place, and person. Speech is fluent. Comprehension is intact. Cranial nerves III IV, VI, VII and XII are normal. No pronator drift. Power greater than 4/5 in bilateral upper and lower extremities. She has decreased sensation to touch and pressure on the left side involving the left face, left upper extremity and left lower extremity.   EXTREMITIES: No cyanosis. No clubbing.   SKIN: No rash. No lesions.   LABORATORY, DIAGNOSTIC AND RADIOLOGICAL DATA: White count 7.2, hemoglobin 14.1, platelet count 248,000. BMP: Sodium 142, potassium 3.6, BUN 20, creatinine 0.7. LFTs are normal. CK 108, magnesium 1.8. Troponin negative. INR 1. CT of the head is negative. Her EKG shows that she has sinus bradycardia at 51, no acute ischemic changes, unchanged from EKG at [facility].   IMPRESSION: Left-sided numbness, suspect transient ischemic attack, hypertension, hyperlipidemia, history of arrhythmia status post ablation, history of transient ischemic attack and history of colon cancer with right lung metastasis status post resection.   PLAN: A 74 year old female with history of hypertension, hyperlipidemia and transient ischemic attack in the past not on any antiplatelet agent. She presented with left-sided numbness at around 2:00. She says the numbness has been worsening  since then. She denies any motor involvement, any speech involvement. Her initial CT of the head is negative. She received 325 mg of aspirin in the Emergency Room. I will continue her on aspirin because she is aspirin naive. Will get an MRI of the brain, echo and ultrasound carotids. She is already on statin which I will continue. Will check a lipid profile on her. Her EKG sinus bradycardia at this time. Will monitor on telemetry. Will continue ramipril and amlodipine. Will hold her hydrochlorothiazide at this time because I don't want to drop her blood pressure too much, allow some permissive hypertension because of ischemia. Will also place her on neuro checks. Will get a physical therapy consult on her for evaluation.   Plan was discussed with the patient and family in detail.  TIME SPENT WITH ADMISSION AND COORDINATION: Around 45 minutes.   ____________________________ Mena Pauls, MD ag:cms D: 01/11/2012 19:19:14 ET T: 01/12/2012 06:08:32 ET JOB#: [id number]  cc: Mena Pauls, MD, <Dictator> Juline Patch, MD Mena Pauls MD ELECTRONICALLY SIGNED 01/24/2012 12:20

## 2015-03-15 NOTE — Consult Note (Signed)
PATIENT NAME:  Kelly Krause, Kelly Krause MR#:  [id number] DATE OF BIRTH:  20-Sep-1941  DATE OF CONSULTATION:  01/13/2012  REFERRING PHYSICIAN:  Vivien Presto, MD CONSULTING PHYSICIAN:  Rudell Cobb. Loletta Specter, MD  HISTORY: Kelly Krause is a 74 year old right-handed married white patient of Dr. Otilio Miu, a retired Web designer with history of hypertension, hyperlipidemia, colon cancer with metastasis to lung status post right lower lobe and colon resections and chemotherapy, and history of cardiac dysrhythmia, status post 2005 at [facility] ablation procedure. She was admitted 01/11/2012 and is referred for evaluation of numbness of left arm and leg. History comes from the patient and from her hospital chart.   The patient was brought to the emergency room in late afternoon of 01/11/2012 by EMTs because of the patient's complaint of sudden onset of numbness of her left side. She reports being in her usual state of health on 01/11/2012 including no problems with a routine workout which included 30 minutes of treadmill. Approximately 30 minutes after the workout, in mid afternoon, when at the table for lunch, she reports sudden onset of feeling queasy, and then numbness and "thick" quality of left side of mouth, and then left side of the face, and then left upper extremity and left posterior scalp, then left leg. She reports with this she also felt lightheaded. Her blood pressure on arrival to the emergency room 170/49 with heart rate 51. On admission there was subjective decrease of sensation of left arm and leg. Her symptoms cleared while in the emergency room. She reports she had a similar bout of symptoms on 01/09/2012 while with her husband and son, at approximately 10:00 a.m., while seated, with symptoms clearing before suppertime. She reports very frequent bouts of similar symptoms between 2000 and 2003, during a period of great stress. With those episodes she reports an increase of  heart rate and blood pressure. She reports thereafter spells every year or every other year including episode in November 2006 precipitating admission to the hospital. Brain MRI scan showed no acute findings. She reports left more than right posterior neck pain with turning of her head in the past couple of years. She reports frequent bouts of neck discomfort between 2000 and 2003 of 10 to 20 second sharp left side neck pain.   PHYSICAL EXAMINATION: The patient is a well-developed, well-nourished white woman in no apparent distress, normocephalic without evidence of trauma, and with supple neck with mild decrease of cervical range of motion to bilateral rotations and moderate decrease of rotation with bend to each side, these movements associated with left greater than right posterior neck pain. Mental status was normal, although cognitive testing was not performed in detail. She was alert and oriented with clear speech, normal expression, and was lucid and a good historian with normal affect. Cranial nerve examination was symmetric and normal including facial sensation, facial appearance at rest and with conversation, normal eye movements, and full visual field to finger count for each eye; visual acuity not tested. Motor examination of the extremities showed normal tone and bulk throughout, no extremity drift, and symmetric full power throughout. Extremity sensation testing was normal and symmetric including hand and foot tapping and finger-to-nose movements. Extremity sensation was symmetric and normal. Reflexes were 1+ throughout. Her gait was not tested.   IMPRESSION: Bouts of left side numbness associated with ipsilateral cervical muscle tension in the setting of history consistent with cervical arthritis.   RECOMMENDATIONS:  1. Refer to physical therapy for cervical muscle  tension and discomfort.  2. Trial of bolster cervical pillow.  3. She is advised to try discontinuing use of bifocal glasses to  avoid leaning her head back while reading, and get a separate set of reading glasses.   I appreciate being asked to see this pleasant and interesting lady.  ____________________________ Rudell Cobb. Loletta Specter, MD prc:slb D: 02/13/2012 13:34:12 ET T: 02/13/2012 14:13:12 ET JOB#: [id number]  cc: Rudell Cobb. Loletta Specter, MD, <Dictator> Linton Flemings MD ELECTRONICALLY SIGNED 02/15/2012 18:34

## 2015-03-15 NOTE — Discharge Summary (Signed)
PATIENT NAME:  Kelly Krause, Kelly Krause MR#:  [id number] DATE OF BIRTH:  02-28-1941  DATE OF ADMISSION:  01/11/2012 DATE OF DISCHARGE:  01/13/2012  CONSULTANT: Michaela Corner, MD - Neurology.   CHIEF COMPLAINT: Left-sided body numbness, elevated blood pressure.   DISCHARGE DIAGNOSES:  1. Left-sided numbness, likely from neck arthritis/spasms.  2. Malignant hypertension.   DISCHARGE MEDICATIONS:  1. Ramipril 10 mg daily.  2. Simvastatin 40 mg daily.  3. Bupropion 150 mg extended-release 1 tab every 24 hours.  4. Omeprazole 20 mg daily.  5. HCTZ 12.5 mg daily.  6. Loratadine 10 mg daily.  7. Sotalol 80 mg twice a day.  8. Amlodipine 10 mg daily.  9. Aspirin 81 mg daily.   DIET: Low sodium, ADA diet.   ACTIVITY: As tolerated.  DISCHARGE FOLLOWUP:  Please followup with outpatient physical therapy and follow up with your primary care physician within one week for blood pressure check and further evaluation. Please follow up with neurologist, Dr. Loletta Specter, if having worsening or recurrent symptoms.   DISPOSITION: Home.   HISTORY OF PRESENT ILLNESS: Please see the full History and Physical dictated on 01/11/2012, but briefly this is a 74 year old female with history of hypertension, hyperlipidemia, history of transient ischemic attacks, and cardiac arrhythmia status post ablation at [facility] in 2005, on sotalol, who presented with left-sided numbness. On arrival to an urgent care center, on the day of her admission, she was noted to have a blood pressure of 207/93. She was given aspirin 325 mg. She was referred to the emergency department. On arrival here, CT of the head was negative for acute bleed or ischemia and initial thought was perhaps she had a transient ischemic attack or stroke. She was not on antiplatelets and she was started on aspirin. Numbness was on the left side of the body including the scalp and neck.  SIGNIFICANT LABS/IMAGING: Creatinine was normal this hospitalization. LFTs were within  normal limits. Troponins were negative x3. WBC and hemoglobin and hematocrit normal. INR normal.   Echocardiogram showed normal ejection fraction. No thrombus. Right ventricular systolic pressure was slightly elevated at 30 to 40 mmHg.   Ultrasound of the carotids: No significant stenosis.   MRI of the brain: No acute cerebrovascular accident or infarct. Mild chronic microangiopathy. Mild paranasal sinus disease.   HOSPITAL COURSE: The patient was admitted to telemetry. She was started on aspirin and initially permissive hypertension was allowed given the thought that she perhaps has a transient ischemic attack. Neurology was consulted. It appeared that numbness came and went. There was no radiculopathy. She has history of neck arthritis and neck pain and has limited motion in her neck secondary to pain. Per neurology this likely is not a transient ischemic attack or stroke and likely is neck arthritis and spasming causing her symptoms. Of note, the previous episode was very similar. At that time, she also was noted to have high blood pressure. The thought at that time was perhaps she had a transient ischemic attack. Given the negative work-up studies here, including the fact that she has a negative MRI with symptoms ongoing for multiple days, and which would have been positive on the MRI causing ischemia if this truly was a CVA, the patient will be discharged with outpatient follow-up, neck physical therapy, and a baby aspirin. On arrival her blood pressure was elevated and here amlodipine was increased from 5 to 10 mg daily with resumption of her other outpatient medications. Blood pressure has been better controlled. Her LDL was  within goal. She is to be discharged at this point with a cervical pillow and outpatient PT for neck exercises and range of motion.      CODE STATUS: THE PATIENT IS FULL CODE.  TOTAL TIME SPENT: 35 minutes.  ____________________________ Vivien Presto,  MD sa:slb D: 01/13/2012 14:33:00 ET T: 01/14/2012 10:59:52 ET JOB#: [id number]  cc: Vivien Presto, MD, <Dictator> Juline Patch, MD Rudell Cobb. Loletta Specter, MD Vivien Presto MD ELECTRONICALLY SIGNED 01/21/2012 8:57

## 2015-03-16 LAB — SURGICAL PATHOLOGY

## 2015-03-27 DIAGNOSIS — IMO0001 Reserved for inherently not codable concepts without codable children: Secondary | ICD-10-CM | POA: Insufficient documentation

## 2015-03-27 DIAGNOSIS — R519 Headache, unspecified: Secondary | ICD-10-CM | POA: Insufficient documentation

## 2015-03-27 DIAGNOSIS — S8010XA Contusion of unspecified lower leg, initial encounter: Secondary | ICD-10-CM

## 2015-03-27 DIAGNOSIS — R319 Hematuria, unspecified: Secondary | ICD-10-CM | POA: Insufficient documentation

## 2015-03-27 DIAGNOSIS — I10 Essential (primary) hypertension: Secondary | ICD-10-CM | POA: Insufficient documentation

## 2015-03-27 DIAGNOSIS — J309 Allergic rhinitis, unspecified: Secondary | ICD-10-CM | POA: Insufficient documentation

## 2015-03-27 DIAGNOSIS — Z8659 Personal history of other mental and behavioral disorders: Secondary | ICD-10-CM | POA: Insufficient documentation

## 2015-03-27 DIAGNOSIS — K219 Gastro-esophageal reflux disease without esophagitis: Secondary | ICD-10-CM | POA: Insufficient documentation

## 2015-03-27 DIAGNOSIS — M9979 Connective tissue and disc stenosis of intervertebral foramina of abdomen and other regions: Secondary | ICD-10-CM | POA: Insufficient documentation

## 2015-03-27 DIAGNOSIS — Z1231 Encounter for screening mammogram for malignant neoplasm of breast: Secondary | ICD-10-CM | POA: Insufficient documentation

## 2015-03-27 DIAGNOSIS — Z0000 Encounter for general adult medical examination without abnormal findings: Secondary | ICD-10-CM | POA: Insufficient documentation

## 2015-03-27 DIAGNOSIS — M4646 Discitis, unspecified, lumbar region: Secondary | ICD-10-CM | POA: Insufficient documentation

## 2015-03-27 DIAGNOSIS — M5136 Other intervertebral disc degeneration, lumbar region: Secondary | ICD-10-CM | POA: Insufficient documentation

## 2015-03-27 DIAGNOSIS — N3010 Interstitial cystitis (chronic) without hematuria: Secondary | ICD-10-CM | POA: Insufficient documentation

## 2015-03-27 DIAGNOSIS — F329 Major depressive disorder, single episode, unspecified: Secondary | ICD-10-CM | POA: Insufficient documentation

## 2015-03-27 DIAGNOSIS — R350 Frequency of micturition: Secondary | ICD-10-CM | POA: Insufficient documentation

## 2015-03-27 DIAGNOSIS — I499 Cardiac arrhythmia, unspecified: Secondary | ICD-10-CM | POA: Insufficient documentation

## 2015-03-27 DIAGNOSIS — Z8679 Personal history of other diseases of the circulatory system: Secondary | ICD-10-CM | POA: Insufficient documentation

## 2015-03-27 DIAGNOSIS — Z859 Personal history of malignant neoplasm, unspecified: Secondary | ICD-10-CM | POA: Insufficient documentation

## 2015-03-27 DIAGNOSIS — J329 Chronic sinusitis, unspecified: Secondary | ICD-10-CM | POA: Insufficient documentation

## 2015-03-27 DIAGNOSIS — E7849 Other hyperlipidemia: Secondary | ICD-10-CM | POA: Insufficient documentation

## 2015-03-27 DIAGNOSIS — I471 Supraventricular tachycardia: Secondary | ICD-10-CM | POA: Insufficient documentation

## 2015-03-27 DIAGNOSIS — Z780 Asymptomatic menopausal state: Secondary | ICD-10-CM | POA: Insufficient documentation

## 2015-03-27 DIAGNOSIS — R51 Headache: Secondary | ICD-10-CM

## 2015-03-27 DIAGNOSIS — S8000XA Contusion of unspecified knee, initial encounter: Secondary | ICD-10-CM | POA: Insufficient documentation

## 2015-03-27 DIAGNOSIS — F32A Depression, unspecified: Secondary | ICD-10-CM | POA: Insufficient documentation

## 2015-03-27 DIAGNOSIS — Z1331 Encounter for screening for depression: Secondary | ICD-10-CM | POA: Insufficient documentation

## 2015-03-27 DIAGNOSIS — K5900 Constipation, unspecified: Secondary | ICD-10-CM | POA: Insufficient documentation

## 2015-03-27 DIAGNOSIS — M5030 Other cervical disc degeneration, unspecified cervical region: Secondary | ICD-10-CM | POA: Insufficient documentation

## 2015-04-13 ENCOUNTER — Inpatient Hospital Stay: Payer: Medicare Other | Attending: Oncology

## 2015-04-13 DIAGNOSIS — Z452 Encounter for adjustment and management of vascular access device: Secondary | ICD-10-CM | POA: Insufficient documentation

## 2015-04-13 DIAGNOSIS — Z8501 Personal history of malignant neoplasm of esophagus: Secondary | ICD-10-CM | POA: Diagnosis present

## 2015-04-13 DIAGNOSIS — C159 Malignant neoplasm of esophagus, unspecified: Secondary | ICD-10-CM

## 2015-04-13 MED ORDER — SODIUM CHLORIDE 0.9 % IJ SOLN
10.0000 mL | INTRAMUSCULAR | Status: DC | PRN
  Administered 2015-04-13: 10 mL via INTRAVENOUS
  Filled 2015-04-13: qty 10

## 2015-04-13 MED ORDER — HEPARIN SOD (PORK) LOCK FLUSH 100 UNIT/ML IV SOLN
500.0000 [IU] | Freq: Once | INTRAVENOUS | Status: AC
  Administered 2015-04-13: 500 [IU] via INTRAVENOUS
  Filled 2015-04-13: qty 5

## 2015-04-21 ENCOUNTER — Ambulatory Visit: Payer: Medicare Other | Admitting: Anesthesiology

## 2015-04-21 ENCOUNTER — Ambulatory Visit
Admission: RE | Admit: 2015-04-21 | Discharge: 2015-04-21 | Disposition: A | Discharge status: Home / Self Care | Payer: Medicare Other | Source: Ambulatory Visit | Attending: Gastroenterology | Admitting: Gastroenterology

## 2015-04-21 ENCOUNTER — Encounter: Payer: Self-pay | Admitting: Anesthesiology

## 2015-04-21 ENCOUNTER — Encounter
Admission: RE | Disposition: A | Discharge status: Home / Self Care | Payer: Self-pay | Source: Ambulatory Visit | Attending: Gastroenterology

## 2015-04-21 DIAGNOSIS — I4891 Unspecified atrial fibrillation: Secondary | ICD-10-CM | POA: Diagnosis not present

## 2015-04-21 DIAGNOSIS — Z85118 Personal history of other malignant neoplasm of bronchus and lung: Secondary | ICD-10-CM | POA: Insufficient documentation

## 2015-04-21 DIAGNOSIS — Z90710 Acquired absence of both cervix and uterus: Secondary | ICD-10-CM | POA: Insufficient documentation

## 2015-04-21 DIAGNOSIS — Z8501 Personal history of malignant neoplasm of esophagus: Secondary | ICD-10-CM | POA: Diagnosis not present

## 2015-04-21 DIAGNOSIS — R1310 Dysphagia, unspecified: Secondary | ICD-10-CM | POA: Diagnosis present

## 2015-04-21 DIAGNOSIS — K2210 Ulcer of esophagus without bleeding: Secondary | ICD-10-CM | POA: Insufficient documentation

## 2015-04-21 DIAGNOSIS — Z923 Personal history of irradiation: Secondary | ICD-10-CM | POA: Diagnosis not present

## 2015-04-21 DIAGNOSIS — Z91040 Latex allergy status: Secondary | ICD-10-CM | POA: Diagnosis not present

## 2015-04-21 DIAGNOSIS — Z7982 Long term (current) use of aspirin: Secondary | ICD-10-CM | POA: Insufficient documentation

## 2015-04-21 DIAGNOSIS — F329 Major depressive disorder, single episode, unspecified: Secondary | ICD-10-CM | POA: Insufficient documentation

## 2015-04-21 DIAGNOSIS — Z79899 Other long term (current) drug therapy: Secondary | ICD-10-CM | POA: Diagnosis not present

## 2015-04-21 DIAGNOSIS — Z823 Family history of stroke: Secondary | ICD-10-CM | POA: Insufficient documentation

## 2015-04-21 DIAGNOSIS — Z8249 Family history of ischemic heart disease and other diseases of the circulatory system: Secondary | ICD-10-CM | POA: Diagnosis not present

## 2015-04-21 DIAGNOSIS — K317 Polyp of stomach and duodenum: Secondary | ICD-10-CM | POA: Diagnosis not present

## 2015-04-21 DIAGNOSIS — Z881 Allergy status to other antibiotic agents status: Secondary | ICD-10-CM | POA: Insufficient documentation

## 2015-04-21 DIAGNOSIS — Z885 Allergy status to narcotic agent status: Secondary | ICD-10-CM | POA: Insufficient documentation

## 2015-04-21 DIAGNOSIS — Z803 Family history of malignant neoplasm of breast: Secondary | ICD-10-CM | POA: Diagnosis not present

## 2015-04-21 DIAGNOSIS — Z85038 Personal history of other malignant neoplasm of large intestine: Secondary | ICD-10-CM | POA: Diagnosis not present

## 2015-04-21 DIAGNOSIS — Z888 Allergy status to other drugs, medicaments and biological substances status: Secondary | ICD-10-CM | POA: Insufficient documentation

## 2015-04-21 DIAGNOSIS — Z8673 Personal history of transient ischemic attack (TIA), and cerebral infarction without residual deficits: Secondary | ICD-10-CM | POA: Diagnosis not present

## 2015-04-21 DIAGNOSIS — K2970 Gastritis, unspecified, without bleeding: Secondary | ICD-10-CM | POA: Diagnosis not present

## 2015-04-21 HISTORY — PX: ESOPHAGOGASTRODUODENOSCOPY: SHX5428

## 2015-04-21 SURGERY — EGD (ESOPHAGOGASTRODUODENOSCOPY)
Anesthesia: General

## 2015-04-21 MED ORDER — PROPOFOL INFUSION 10 MG/ML OPTIME
INTRAVENOUS | Status: DC | PRN
  Administered 2015-04-21: 100 ug/kg/min via INTRAVENOUS

## 2015-04-21 MED ORDER — SODIUM CHLORIDE 0.9 % IV SOLN: INTRAVENOUS | Status: DC

## 2015-04-21 MED ORDER — PROPOFOL 10 MG/ML IV BOLUS
INTRAVENOUS | Status: DC | PRN
  Administered 2015-04-21: 50 mg via INTRAVENOUS
  Administered 2015-04-21: 20 mg via INTRAVENOUS
  Administered 2015-04-21: 50 mg via INTRAVENOUS

## 2015-04-21 MED ORDER — SODIUM CHLORIDE 0.9 % IV SOLN
INTRAVENOUS | Status: DC
  Administered 2015-04-21: 1000 mL via INTRAVENOUS
  Administered 2015-04-21: 11:00:00 via INTRAVENOUS

## 2015-04-21 NOTE — Anesthesia Postprocedure Evaluation (Signed)
  Anesthesia Post-op Note  Patient: Kelly Krause  Procedure(s) Performed: Procedure(s): ESOPHAGOGASTRODUODENOSCOPY (EGD) (N/A)  Anesthesia type:General  Patient location: PACU  Post pain: Pain level controlled  Post assessment: Post-op Vital signs reviewed, Patient's Cardiovascular Status Stable, Respiratory Function Stable, Patent Airway and No signs of Nausea or vomiting  Post vital signs: Reviewed and stable  Last Vitals:  Filed Vitals:   04/21/15 1158  BP:   Pulse: 57  Temp:   Resp: 16    Level of consciousness: awake, alert  and patient cooperative  Complications: No apparent anesthesia complications

## 2015-04-21 NOTE — Op Note (Signed)
[facility] Patient Name: Kelly Krause Procedure Date: 04/21/2015 10:38 AM MRN: [id number] Account #: [id number] Date of Birth: 12/02/1940 Admit Type: Outpatient Age: 74 Room: [facility] ENDO ROOM 3 Gender: Female Note Status: Finalized Procedure:         Upper GI endoscopy Indications:       Dysphagia, Odynophagia Providers:         Lollie Sails, MD Referring MD:      Juline Patch, MD (Referring MD) Medicines:         Monitored Anesthesia Care Complications:     No immediate complications. Procedure:         Pre-Anesthesia Assessment:                    - ASA Grade Assessment: III - A patient with severe                     systemic disease.                    After obtaining informed consent, the endoscope was passed                     under direct vision. Throughout the procedure, the                     patient's blood pressure, pulse, and oxygen saturations                     were monitored continuously. The Endoscope was introduced                     through the mouth, and advanced to the third part of                     duodenum. The upper GI endoscopy was accomplished without                     difficulty. The patient tolerated the procedure well. Findings:      Post- treatment (XRT) some scarring with surface telangiectasias is       noted in the region of 29-30 cm from the incisors, multiple biopsies       taken with a cold forcep. No evidence of mucosal lesion otherwise, no       evidence of stricture or stenosis, the scope passing easily. biopsies       also taken from 35 cm for histology. The GE junction is about 38 cm,       slight variable z-line, biopsy taken with cold forcep. An area of white       plaque, possible leukoplakia is found at about 23 cm, extending up and       down about a cm. Biopsies taken with a cold forcep.      Patchy minimal inflammation characterized by congestion (edema) and       erythema was  found in the gastric body. Biopsies were taken with a cold       forceps for histology.      A single 7 mm semi-sessile polyp with no bleeding and no stigmata of       recent bleeding was found on the greater curvature of the gastric body.       Biopsies were taken with a cold forceps for histology.      The cardia and  gastric fundus were normal on retroflexion.      The examined duodenum was normal. Impression:        - Gastritis. Biopsied.                    - A single gastric polyp. Biopsied.                    - Normal examined duodenum.                    - evidence of previous XRT in esophagus without stenosis                     or narrowing. Recommendation:    - Await pathology results.                    - Continue present medications.                    - Clear liquid diet today.                    - Full liquid diet for 1 day.                    - Soft diet for 5 days. Procedure Code(s): --- Professional ---                    [phone], Esophagogastroduodenoscopy, flexible, transoral;                     with biopsy, single or multiple Diagnosis Code(s): --- Professional ---                    535.50, Unspecified gastritis and gastroduodenitis,                     without mention of hemorrhage                    211.1, Benign neoplasm of stomach                    787.20, Dysphagia, unspecified CPT copyright 2014 American Medical Association. All rights reserved. The codes documented in this report are preliminary and upon coder review may  be revised to meet current compliance requirements. Lollie Sails, MD 04/21/2015 11:38:44 AM This report has been signed electronically. Number of Addenda: 0 Note Initiated On: 04/21/2015 10:38 AM      [facility]

## 2015-04-21 NOTE — Transfer of Care (Signed)
Immediate Anesthesia Transfer of Care Note  Patient: Kelly Krause  Procedure(s) Performed: Procedure(s): ESOPHAGOGASTRODUODENOSCOPY (EGD) (N/A)  Patient Location: PACU and Endoscopy Unit  Anesthesia Type:General  Level of Consciousness: awake and alert   Airway & Oxygen Therapy: Patient Spontanous Breathing and Patient connected to nasal cannula oxygen  Post-op Assessment: Report given to RN and Post -op Vital signs reviewed and stable  Post vital signs: Reviewed and stable  Last Vitals:  Filed Vitals:   04/21/15 0930  BP: 161/69  Pulse: 52  Temp: 35.6 C  Resp: 16    Complications: No apparent anesthesia complications

## 2015-04-21 NOTE — Anesthesia Preprocedure Evaluation (Addendum)
Anesthesia Evaluation  Patient identified by MRN, date of birth, ID band Patient awake    Reviewed: Allergy & Precautions, NPO status , Patient's Chart, lab work & pertinent test results, reviewed documented beta blocker date and time   Airway Mallampati: II  TM Distance: >3 FB     Dental  (+) Chipped   Pulmonary          Cardiovascular hypertension,     Neuro/Psych  Headaches, PSYCHIATRIC DISORDERS Depression    GI/Hepatic   Endo/Other    Renal/GU      Musculoskeletal  (+) Arthritis -,   Abdominal   Peds  Hematology   Anesthesia Other Findings Had lot of arrhythmias in the past, ok now.Had cardiac ablation 10 yrs ago.  Reproductive/Obstetrics                            Anesthesia Physical Anesthesia Plan  ASA: III  Anesthesia Plan: General   Post-op Pain Management:    Induction: Intravenous  Airway Management Planned: Nasal Cannula  Additional Equipment:   Intra-op Plan:   Post-operative Plan:   Informed Consent: I have reviewed the patients History and Physical, chart, labs and discussed the procedure including the risks, benefits and alternatives for the proposed anesthesia with the patient or authorized representative who has indicated his/her understanding and acceptance.     Plan Discussed with: CRNA  Anesthesia Plan Comments:         Anesthesia Quick Evaluation

## 2015-04-21 NOTE — H&P (Signed)
Outpatient short stay form Pre-procedure 04/21/2015 10:34 AM Lollie Sails MD  Primary Physician: Dr. Oliva Bustard,  Reason for visit:  Esophageal ulcer, dysphagia, or stool history of esophageal cancer.  History of present illness:  Patient is a 74 year old female who presents for follow-up in regards to history of dysphagia in the setting of previous history of esophageal cancer. He states that it has been months since she underwent any kind of treatment. She has had a recent PET scan which was negative. He has a some amount of odynophagia. She does not hang or regurgitate foods.    Current facility-administered medications:  .  0.9 %  sodium chloride infusion, , Intravenous, Continuous, Lollie Sails, MD, Last Rate: 10 mL/hr at 04/21/15 0943, 1,000 mL at 04/21/15 0943 .  0.9 %  sodium chloride infusion, , Intravenous, Continuous, Lollie Sails, MD  Prescriptions prior to admission  Medication Sig Dispense Refill Last Dose  . aspirin 81 MG tablet Take 1 tablet by mouth daily.   04/07/2015  . Biotin 300 MCG TABS Take 1 tablet by mouth daily.   04/20/2015  . buPROPion (WELLBUTRIN SR) 150 MG 12 hr tablet Take 1 tablet by mouth 2 (two) times daily.   04/20/2015  . cetirizine (ZYRTEC) 10 MG tablet Take 1 tablet by mouth 2 (two) times daily.    04/20/2015  . docusate sodium (COLACE) 100 MG capsule Take 200 mg by mouth daily.   04/20/2015  . hydrochlorothiazide (HYDRODIURIL) 25 MG tablet Take 25 mg by mouth daily.   04/20/2015  . Nutritional Supplements (JUICE PLUS FIBRE PO) Take 2 tablets by mouth daily.   04/20/2015  . pantoprazole (PROTONIX) 40 MG tablet Take 1 tablet by mouth 2 (two) times daily.   04/20/2015  . polyethylene glycol powder (GLYCOLAX/MIRALAX) powder Take 17 g by mouth at bedtime as needed for moderate constipation.   04/20/2015  . ramipril (ALTACE) 10 MG capsule Take 1 capsule by mouth daily.   04/20/2015  . sertraline (ZOLOFT) 25 MG tablet Take 25 mg by mouth daily.   04/21/2015  at 06:30   . simvastatin (ZOCOR) 40 MG tablet Take 1 tablet by mouth daily.   04/13/2015  . sotalol (BETAPACE) 80 MG tablet Take 80 mg by mouth 2 (two) times daily.   04/21/2015 at 06:30     Allergies  Allergen Reactions  . Acetaminophen     Pt said she has been taking and does not bother her  . Amoxicillin-Pot Clavulanate Itching and Nausea Only  . Codeine Nausea Only and Other (See Comments)    GI Upset headaches  . Demerol [Meperidine] Itching, Nausea Only and Other (See Comments)    GI Upset, Headaches   . Latex Hives and Itching  . Morphine And Related Itching, Nausea Only and Other (See Comments)    headaches  . Oxycodone-Acetaminophen Other (See Comments)  . Penicillins Diarrhea  . Tramadol Other (See Comments)     GI Upset  . Tapentadol Rash     Past Medical History  Diagnosis Date  . Major depressive disorder, recurrent episode, moderate   . Persistent disorder of initiating or maintaining sleep     Review of systems:      Physical Exam    Heart and lungs: Regular rate and rhythm without rub or gallop, lungs are bilaterally clear    HEENT: Normocephalic atraumatic eyes are anicteric    Other:     Pertinant exam for procedure: Soft mild discomfort to palpation in the epigastric  region bowel sounds are positive normoactive is no apparent organomegaly    Planned proceedures: EGD and indicated procedures. I have discussed the risks benefits and complications of procedures to include not limited to bleeding, infection, perforation and the risk of sedation and the patient wishes to proceed.    Lollie Sails, MD Gastroenterology 04/21/2015  10:34 AM

## 2015-04-23 ENCOUNTER — Encounter: Payer: Self-pay | Admitting: Gastroenterology

## 2015-04-23 LAB — SURGICAL PATHOLOGY

## 2015-05-01 ENCOUNTER — Ambulatory Visit: Payer: Self-pay | Admitting: Radiation Oncology

## 2015-05-12 NOTE — Addendum Note (Signed)
Addendum  created 05/12/15 1552 by Gunnar Bulla, MD   Modules edited: Anesthesia Attestations

## 2015-05-24 DIAGNOSIS — T827XXA Infection and inflammatory reaction due to other cardiac and vascular devices, implants and grafts, initial encounter: Secondary | ICD-10-CM | POA: Insufficient documentation

## 2015-05-24 DIAGNOSIS — L03818 Cellulitis of other sites: Secondary | ICD-10-CM | POA: Insufficient documentation

## 2015-05-24 DIAGNOSIS — Z79899 Other long term (current) drug therapy: Secondary | ICD-10-CM | POA: Diagnosis not present

## 2015-05-24 DIAGNOSIS — R51 Headache: Secondary | ICD-10-CM | POA: Diagnosis not present

## 2015-05-24 DIAGNOSIS — I10 Essential (primary) hypertension: Secondary | ICD-10-CM | POA: Diagnosis not present

## 2015-05-24 DIAGNOSIS — Y838 Other surgical procedures as the cause of abnormal reaction of the patient, or of later complication, without mention of misadventure at the time of the procedure: Secondary | ICD-10-CM | POA: Diagnosis not present

## 2015-05-24 DIAGNOSIS — Z880 Allergy status to penicillin: Secondary | ICD-10-CM | POA: Diagnosis not present

## 2015-05-24 DIAGNOSIS — Z7982 Long term (current) use of aspirin: Secondary | ICD-10-CM | POA: Insufficient documentation

## 2015-05-24 NOTE — ED Notes (Signed)
Pt ambulatory to triage with no difficulty. Pt reports early on Sunday morning she noticed an area over her portacath that felt tender. As the day went on she noticed redness qand the area was getting bigger and more sore. Pt reports last had her port flushed 5 weeks ago.

## 2015-05-25 ENCOUNTER — Emergency Department
Admission: EM | Admit: 2015-05-25 | Discharge: 2015-05-25 | Disposition: A | Discharge status: Home / Self Care | Payer: Medicare Other | Attending: Emergency Medicine | Admitting: Emergency Medicine

## 2015-05-25 DIAGNOSIS — T80219A Unspecified infection due to central venous catheter, initial encounter: Secondary | ICD-10-CM

## 2015-05-25 DIAGNOSIS — L03818 Cellulitis of other sites: Secondary | ICD-10-CM

## 2015-05-25 LAB — CBC
HCT: 39 % (ref 35.0–47.0)
Hemoglobin: 13 g/dL (ref 12.0–16.0)
MCH: 31.8 pg (ref 26.0–34.0)
MCHC: 33.3 g/dL (ref 32.0–36.0)
MCV: 95.7 fL (ref 80.0–100.0)
PLATELETS: 236 10*3/uL (ref 150–440)
RBC: 4.07 MIL/uL (ref 3.80–5.20)
RDW: 13.4 % (ref 11.5–14.5)
WBC: 6 10*3/uL (ref 3.6–11.0)

## 2015-05-25 LAB — BASIC METABOLIC PANEL
Anion gap: 10 (ref 5–15)
BUN: 19 mg/dL (ref 6–20)
CHLORIDE: 101 mmol/L (ref 101–111)
CO2: 30 mmol/L (ref 22–32)
Calcium: 9.6 mg/dL (ref 8.9–10.3)
Creatinine, Ser: 0.89 mg/dL (ref 0.44–1.00)
GFR calc non Af Amer: >60 mL/min (ref >60)
GLUCOSE: 110 mg/dL — AB (ref 65–99)
Potassium: 3.4 mmol/L — ABNORMAL LOW (ref 3.5–5.1)
SODIUM: 141 mmol/L (ref 135–145)

## 2015-05-25 MED ORDER — VANCOMYCIN HCL IN DEXTROSE 1-5 GM/200ML-% IV SOLN
INTRAVENOUS | Status: AC
  Administered 2015-05-25: 1000 mg via INTRAVENOUS
  Filled 2015-05-25: qty 200

## 2015-05-25 MED ORDER — ACETAMINOPHEN 325 MG PO TABS
ORAL_TABLET | ORAL | Status: AC
  Administered 2015-05-25: 650 mg via ORAL
  Filled 2015-05-25: qty 2

## 2015-05-25 MED ORDER — CLINDAMYCIN HCL 300 MG PO CAPS: 300.0000 mg | ORAL_CAPSULE | Freq: Three times a day (TID) | ORAL | Status: AC

## 2015-05-25 MED ORDER — DIPHENHYDRAMINE HCL 50 MG/ML IJ SOLN
25.0000 mg | Freq: Once | INTRAMUSCULAR | Status: AC
  Administered 2015-05-25: 25 mg via INTRAVENOUS

## 2015-05-25 MED ORDER — DIPHENHYDRAMINE HCL 50 MG/ML IJ SOLN
INTRAMUSCULAR | Status: AC
  Administered 2015-05-25: 25 mg via INTRAVENOUS
  Filled 2015-05-25: qty 1

## 2015-05-25 MED ORDER — VANCOMYCIN HCL IN DEXTROSE 1-5 GM/200ML-% IV SOLN
1000.0000 mg | Freq: Once | INTRAVENOUS | Status: AC
  Administered 2015-05-25: 1000 mg via INTRAVENOUS

## 2015-05-25 MED ORDER — ACETAMINOPHEN 325 MG PO TABS
650.0000 mg | ORAL_TABLET | Freq: Once | ORAL | Status: AC
  Administered 2015-05-25: 650 mg via ORAL

## 2015-05-25 NOTE — ED Notes (Addendum)
IV site unremarkable; antibiotic infusing without difficulty; pt c/o headache; MD informed and verbal order for tylenol given as pt requested

## 2015-05-25 NOTE — ED Provider Notes (Signed)
[facility] Emergency Department Provider Note  ____________________________________________  Time seen: Approximately 319 AM  I have reviewed the triage vital signs and the nursing notes.   HISTORY  Chief Complaint Cellulitis    HPI Kelly Krause is a 74 y.o. female with a history of esophageal cancer who comes in with pain in her Port-A-Cath. The patient reports that she's had pain stinging redness and throbbing all day. She reports that she has never had this before. Her Port-A-Cath was placed 2 years ago and last flushed approximately 5 weeks ago. The patient reports that has not been used for chemotherapy for over 10 months. She reports that the redness is above the Port-A-Cath site and is very tender to palpation. She reports that the pain became worse as the day continued on. She's been taking Tylenol for pain but reports that her pain is a 6 out of 10 in intensity and it does come and go. The patient denies any fevers denies feeling lightheaded or dizzy. She has had some headache as well. The patient was unable able to tolerate the pain so she decided to come in for evaluation.   Past Medical History  Diagnosis Date  . Major depressive disorder, recurrent episode, moderate   . Persistent disorder of initiating or maintaining sleep     Patient Active Problem List   Diagnosis Date Noted  . Familial multiple lipoprotein-type hyperlipidemia 03/27/2015  . Clinical depression 03/27/2015  . Acid reflux 03/27/2015  . CN (constipation) 03/27/2015  . Ulcerative cystitis 03/27/2015  . Routine general medical examination at a health care facility 03/27/2015  . Allergic rhinitis 03/27/2015  . H/O malignant neoplasm 03/27/2015  . Contusion of knee and lower leg 03/27/2015  . Narrowing of intervertebral disc space 03/27/2015  . DDD (degenerative disc disease), cervical 03/27/2015  . DDD (degenerative disc disease), lumbar 03/27/2015  . H/O: depression  03/27/2015  . Can't get food down 03/27/2015  . Essential (primary) hypertension 03/27/2015  . Cephalalgia 03/27/2015  . Discitis of lumbar region 03/27/2015  . Blood in the urine 03/27/2015  . H/O: HTN (hypertension) 03/27/2015  . Menopause 03/27/2015  . Screening for depression 03/27/2015  . Sinus infection 03/27/2015  . FOM (frequency of micturition) 03/27/2015  . Arrhythmia, ventricular 03/27/2015  . Encounter for mammogram to establish baseline mammogram 03/27/2015  . Atrial tachycardia 03/27/2015    Past Surgical History  Procedure Laterality Date  . Sinusotomy    . Bunionectomy    . Lobectomy Right   . Partial hysterectomy    . Abdominal hysterectomy    . Esophgeal cancer    . Esophagogastroduodenoscopy N/A 04/21/2015    Procedure: ESOPHAGOGASTRODUODENOSCOPY (EGD);  Surgeon: Lollie Sails, MD;  Location: [facility] ENDOSCOPY;  Service: Endoscopy;  Laterality: N/A;    Current Outpatient Rx  Name  Route  Sig  Dispense  Refill  . aspirin 81 MG tablet   Oral   Take 1 tablet by mouth daily.         . Biotin 300 MCG TABS   Oral   Take 1 tablet by mouth daily.         Marland Kitchen buPROPion (WELLBUTRIN SR) 150 MG 12 hr tablet   Oral   Take 1 tablet by mouth 2 (two) times daily.         . cetirizine (ZYRTEC) 10 MG tablet   Oral   Take 1 tablet by mouth 2 (two) times daily.          Marland Kitchen  clindamycin (CLEOCIN) 300 MG capsule   Oral   Take 1 capsule (300 mg total) by mouth 3 (three) times daily.   30 capsule   0   . docusate sodium (COLACE) 100 MG capsule   Oral   Take 200 mg by mouth daily.         . hydrochlorothiazide (HYDRODIURIL) 25 MG tablet   Oral   Take 25 mg by mouth daily.         . Nutritional Supplements (JUICE PLUS FIBRE PO)   Oral   Take 2 tablets by mouth daily.         . pantoprazole (PROTONIX) 40 MG tablet   Oral   Take 1 tablet by mouth 2 (two) times daily.         . polyethylene glycol powder (GLYCOLAX/MIRALAX) powder   Oral   Take  17 g by mouth at bedtime as needed for moderate constipation.         . ramipril (ALTACE) 10 MG capsule   Oral   Take 1 capsule by mouth daily.         . sertraline (ZOLOFT) 25 MG tablet   Oral   Take 25 mg by mouth daily.         . simvastatin (ZOCOR) 40 MG tablet   Oral   Take 1 tablet by mouth daily.         . sotalol (BETAPACE) 80 MG tablet   Oral   Take 80 mg by mouth 2 (two) times daily.           Allergies Amoxicillin-pot clavulanate; Codeine; Demerol; Latex; Morphine and related; Oxycodone-acetaminophen; Penicillins; Tramadol; and Tapentadol  Family History  Problem Relation Age of Onset  . Stroke Mother   . Depression Mother   . Alcohol abuse Father   . Stroke Father   . Hypertension Father     Social History History  Substance Use Topics  . Smoking status: Not on file  . Smokeless tobacco: Not on file  . Alcohol Use: Not on file    Review of Systems Constitutional: No fever/chills Eyes: No visual changes. ENT: No sore throat. Cardiovascular: chest pain. Respiratory: Denies shortness of breath. Gastrointestinal: No abdominal pain.  No nausea, no vomiting.  No diarrhea.  No constipation. Genitourinary: Negative for dysuria. Musculoskeletal: Negative for back pain. Skin: Erythema above Port-A-Cath site Neurological: Negative for headaches, focal weakness or numbness.  10-point ROS otherwise negative.  ____________________________________________   PHYSICAL EXAM:  VITAL SIGNS: ED Triage Vitals  Enc Vitals Group     BP 05/24/15 2340 186/73 mmHg     Pulse Rate 05/24/15 2340 59     Resp 05/24/15 2340 18     Temp 05/24/15 2340 97.6 F (36.4 C)     Temp Source 05/24/15 2340 Oral     SpO2 05/24/15 2340 97 %     Weight 05/24/15 2340 165 lb (74.844 kg)     Height 05/24/15 2340 5' 7.5" (1.715 m)     Head Cir --      Peak Flow --      Pain Score 05/24/15 2349 7     Pain Loc --      Pain Edu? --      Excl. in [place]? --      Constitutional: Alert and oriented. Well appearing and in mild distress. Eyes: Conjunctivae are normal. PERRL. EOMI. Head: Atraumatic. Nose: No congestion/rhinnorhea. Mouth/Throat: Mucous membranes are moist.  Oropharynx non-erythematous. Cardiovascular: Normal rate, regular  rhythm. Grossly normal heart sounds.  Good peripheral circulation. Respiratory: Normal respiratory effort.  No retractions. Lungs CTAB. Chest: Tenderness to palpation around Port-A-Cath site especially in area of erythema above this site 2.5cm x 3.5cm area of erythema with no fluctuance tender to palpation on right chest wall above Port-A-Cath site Gastrointestinal: Soft and nontender. No distention. Active bowel sounds Genitourinary: Deferred Musculoskeletal: No lower extremity tenderness nor edema.  No joint effusions. Neurologic:  Normal speech and language.  Skin:  Skin is warm, dry and intact. No rash noted. Psychiatric: Mood and affect are normal.   ____________________________________________   LABS (all labs ordered are listed, but only abnormal results are displayed)  Labs Reviewed  BASIC METABOLIC PANEL - Abnormal; Notable for the following:    Potassium 3.4 (*)    Glucose, Bld 110 (*)    All other components within normal limits  CULTURE, BLOOD (ROUTINE X 2)  CULTURE, BLOOD (ROUTINE X 2)  CBC   ____________________________________________  EKG  None ____________________________________________  RADIOLOGY  None ____________________________________________   PROCEDURES  Procedure(s) performed: None  Critical Care performed: No  ____________________________________________   INITIAL IMPRESSION / ASSESSMENT AND PLAN / ED COURSE  Pertinent labs & imaging results that were available during my care of the patient were reviewed by me and considered in my medical decision making (see chart for details).  This is a 74 year old female who comes in with pain around her Port-A-Cath  site. The patient's blood work is unremarkable with no significantly elevated white blood cell count but I am concerned there may be some infection around the Port-A-Cath site. I will give the patient some vancomycin IV and have the patient follow-up outpatient should she have no further reaction.  ----------------------------------------- 7:30 AM on 05/25/2015 -----------------------------------------  The patient received the dose of vancomycin but towards the end developed some itching at the top of her head so the vancomycin was stopped. The patient still has some mild erythema above her Port-A-Cath site and continued discomfort but no worsening symptoms no spreading of the cellulitis. The patient will be discharged to follow-up with her physician as well as the previous surgeon as she may need to have her Port-A-Cath removed due to infection. The erythema does not spread over the injection site it is above the injection site. I explained this to the patient and she understands the plan. At this time she does not want any medication for pain and she will follow-up. ____________________________________________   FINAL CLINICAL IMPRESSION(S) / ED DIAGNOSES  Final diagnoses:  Cellulitis of other specified site  Infection due to port-a-cath, initial encounter      Loney Hering, MD 05/25/15 [phone]

## 2015-05-25 NOTE — ED Notes (Signed)
In to administer tylenol and pt reports now itching on her head; concerned about reaction to vancomycin; medication stopped; MD notified; order to stop vancomycin and give iv benadryl

## 2015-05-25 NOTE — Discharge Instructions (Signed)

## 2015-05-29 ENCOUNTER — Other Ambulatory Visit: Payer: Self-pay | Admitting: *Deleted

## 2015-05-29 DIAGNOSIS — C159 Malignant neoplasm of esophagus, unspecified: Secondary | ICD-10-CM

## 2015-05-30 LAB — CULTURE, BLOOD (ROUTINE X 2)
CULTURE: NO GROWTH
Culture: NO GROWTH

## 2015-06-02 ENCOUNTER — Encounter: Payer: Self-pay | Admitting: Oncology

## 2015-06-02 ENCOUNTER — Inpatient Hospital Stay: Payer: Medicare Other

## 2015-06-02 ENCOUNTER — Other Ambulatory Visit: Payer: Self-pay | Admitting: Family Medicine

## 2015-06-02 ENCOUNTER — Inpatient Hospital Stay: Payer: Medicare Other | Attending: Oncology | Admitting: Oncology

## 2015-06-02 VITALS — BP 154/67 | HR 51 | Temp 96.9°F | Wt 174.6 lb

## 2015-06-02 DIAGNOSIS — Z923 Personal history of irradiation: Secondary | ICD-10-CM | POA: Insufficient documentation

## 2015-06-02 DIAGNOSIS — R5383 Other fatigue: Secondary | ICD-10-CM | POA: Diagnosis not present

## 2015-06-02 DIAGNOSIS — Z87891 Personal history of nicotine dependence: Secondary | ICD-10-CM | POA: Insufficient documentation

## 2015-06-02 DIAGNOSIS — Z7982 Long term (current) use of aspirin: Secondary | ICD-10-CM | POA: Insufficient documentation

## 2015-06-02 DIAGNOSIS — Z8501 Personal history of malignant neoplasm of esophagus: Secondary | ICD-10-CM

## 2015-06-02 DIAGNOSIS — F329 Major depressive disorder, single episode, unspecified: Secondary | ICD-10-CM | POA: Insufficient documentation

## 2015-06-02 DIAGNOSIS — Z85038 Personal history of other malignant neoplasm of large intestine: Secondary | ICD-10-CM | POA: Diagnosis present

## 2015-06-02 DIAGNOSIS — G4700 Insomnia, unspecified: Secondary | ICD-10-CM | POA: Insufficient documentation

## 2015-06-02 DIAGNOSIS — C159 Malignant neoplasm of esophagus, unspecified: Secondary | ICD-10-CM

## 2015-06-02 DIAGNOSIS — Z9221 Personal history of antineoplastic chemotherapy: Secondary | ICD-10-CM | POA: Diagnosis not present

## 2015-06-02 DIAGNOSIS — Z79899 Other long term (current) drug therapy: Secondary | ICD-10-CM

## 2015-06-02 DIAGNOSIS — R531 Weakness: Secondary | ICD-10-CM

## 2015-06-02 DIAGNOSIS — R1310 Dysphagia, unspecified: Secondary | ICD-10-CM

## 2015-06-02 DIAGNOSIS — F32A Depression, unspecified: Secondary | ICD-10-CM

## 2015-06-02 LAB — COMPREHENSIVE METABOLIC PANEL
ALBUMIN: 4.5 g/dL (ref 3.5–5.0)
ALT: 17 U/L (ref 14–54)
AST: 22 U/L (ref 15–41)
Alkaline Phosphatase: 72 U/L (ref 38–126)
Anion gap: 5 (ref 5–15)
BILIRUBIN TOTAL: 0.8 mg/dL (ref 0.3–1.2)
BUN: 24 mg/dL — AB (ref 6–20)
CALCIUM: 8.7 mg/dL — AB (ref 8.9–10.3)
CHLORIDE: 98 mmol/L — AB (ref 101–111)
CO2: 30 mmol/L (ref 22–32)
Creatinine, Ser: 0.91 mg/dL (ref 0.44–1.00)
GFR calc non Af Amer: >60 mL/min (ref >60)
Glucose, Bld: 91 mg/dL (ref 65–99)
Potassium: 4.1 mmol/L (ref 3.5–5.1)
SODIUM: 133 mmol/L — AB (ref 135–145)
Total Protein: 7.7 g/dL (ref 6.5–8.1)

## 2015-06-02 LAB — CBC WITH DIFFERENTIAL/PLATELET
BASOS ABS: 0.1 10*3/uL (ref 0–0.1)
Basophils Relative: 1 %
EOS PCT: 8 %
Eosinophils Absolute: 0.5 10*3/uL (ref 0–0.7)
HCT: 39.8 % (ref 35.0–47.0)
Hemoglobin: 13.2 g/dL (ref 12.0–16.0)
Lymphocytes Relative: 34 %
Lymphs Abs: 2.1 10*3/uL (ref 1.0–3.6)
MCH: 31.7 pg (ref 26.0–34.0)
MCHC: 33.1 g/dL (ref 32.0–36.0)
MCV: 95.8 fL (ref 80.0–100.0)
MONO ABS: 0.6 10*3/uL (ref 0.2–0.9)
MONOS PCT: 9 %
Neutro Abs: 2.9 10*3/uL (ref 1.4–6.5)
Neutrophils Relative %: 48 %
Platelets: 262 10*3/uL (ref 150–440)
RBC: 4.15 MIL/uL (ref 3.80–5.20)
RDW: 13.4 % (ref 11.5–14.5)
WBC: 6.1 10*3/uL (ref 3.6–11.0)

## 2015-06-02 NOTE — Progress Notes (Signed)
Patient does have living will.  Smoked in college.

## 2015-06-02 NOTE — Progress Notes (Signed)
[place] @ [facility] [phone]  [phone]     Kelly Krause OB: 06/24/1941  MR#: [id number]  KJZ#:[id number]  Patient Care Team: Juline Patch, MD as PCP - General (Family Medicine)  CHIEF COMPLAINT:  Chief Complaint  Patient presents with  . 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 PET scan was positive for hypermetabolic area in esophagus  T1bN49m  stage IB tumor.  Started on radiation and chemotherapy(may of 2015) Possibility of surgical evaluation had been discssed and patient has declined patient has finished radiation and chemotherapy in July of 2015 2.  July 2 016 Upper endoscopy reveals high-grade dysplastic changes in esophagus at 23 cm  And mild dysplastic changes at 35 and 30 cm in esophagus area. No evidence of invasive malignancy     Cancer of esophagus   06/25/2014 Initial Diagnosis Cancer of esophagus    On 74year old lady with a previous history of carcinoma of esophagus, squamous cell type patient underwent radiation chemotherapy. Patient continues to have problems with dysphagia INTERVAL HISTORY:  Recently patient had upper endoscopy done which revealed multiple lady of dysplastic changes. I had discussion with Dr. SAnastasio Champion gastroenterologist and patient is now being referred for possibility of [facility]frequency or [facility]  Patient's problem of dysphagia continues but has not difficult and weight. Had a recent PET scanning done which was negative. Here  for further follow-up and treatment consideration  REVIEW OF SYSTEMS:    general status: Patient is feeling weak and tired.  No change in a performance status.  No chills.  No fever. HEENT: .  No evidence of stomatitis Lungs: No cough or shortness of breath Cardiac: No chest pain or paroxysmal nocturnal dyspnea GI: No nausea no vomiting no diarrhea no  abdominal pain Dysphagia.  Multiple lady of dysplastic changes in the esophagus and recent upper endoscopy.  Negative PET scan Skin: No rash Lower extremity no swelling Neurological system: No tingling.  No numbness.  No other focal signs Musculoskeletal system no bony pains  As per HPI. Otherwise, a complete review of systems is negatve.  PAST MEDICAL HISTORY: Past Medical History  Diagnosis Date  . Major depressive disorder, recurrent episode, moderate   . Persistent disorder of initiating or maintaining sleep     PAST SURGICAL HISTORY: Past Surgical History  Procedure Laterality Date  . Sinusotomy    . Bunionectomy    . Lobectomy Right   . Partial hysterectomy    . Abdominal hysterectomy    . Esophgeal cancer    . Esophagogastroduodenoscopy N/A 04/21/2015    Procedure: ESOPHAGOGASTRODUODENOSCOPY (EGD);  Surgeon: MLollie Sails MD;  [facility];  Service: Endoscopy;  Laterality: N/A;    FAMILY HISTORY Family History  Problem Relation Age of Onset  . Stroke Mother   . Depression Mother   . Alcohol abuse Father   . Stroke Father   . Hypertension Father     ADVANCED DIRECTIVES:  No flowsheet data found.  HEALTH MAINTENANCE: History  Substance Use Topics  . Smoking status: Former SResearch scientist (life sciences) . Smokeless tobacco: Not on file  . Alcohol Use: Not on file      Allergies  Allergen Reactions  . Amoxicillin-Pot Clavulanate Itching and Nausea Only  . Codeine Nausea Only and Other (See Comments)    GI Upset headaches  . Demerol [Meperidine] Itching, Nausea Only and Other (See Comments)  GI Upset, Headaches   . Latex Hives and Itching  . Morphine And Related Itching, Nausea Only and Other (See Comments)    headaches  . Oxycodone-Acetaminophen Other (See Comments)  . Penicillins Diarrhea  . Strawberry   . Tramadol Other (See Comments)     GI Upset  . Tapentadol Rash    Current Outpatient Prescriptions  Medication Sig Dispense Refill  . aspirin  81 MG tablet Take 1 tablet by mouth daily.    . Biotin 300 MCG TABS Take 1 tablet by mouth daily.    Marland Kitchen buPROPion (WELLBUTRIN SR) 150 MG 12 hr tablet Take 1 tablet by mouth 2 (two) times daily.    . cetirizine (ZYRTEC) 10 MG tablet Take 1 tablet by mouth 2 (two) times daily.     . clindamycin (CLEOCIN) 300 MG capsule Take 1 capsule (300 mg total) by mouth 3 (three) times daily. 30 capsule 0  . docusate sodium (COLACE) 100 MG capsule Take 200 mg by mouth daily.    . hydrochlorothiazide (HYDRODIURIL) 25 MG tablet Take 25 mg by mouth daily.    . Nutritional Supplements (JUICE PLUS FIBRE PO) Take 2 tablets by mouth daily.    . pantoprazole (PROTONIX) 40 MG tablet Take 1 tablet by mouth 2 (two) times daily.    . polyethylene glycol powder (GLYCOLAX/MIRALAX) powder Take 17 g by mouth at bedtime as needed for moderate constipation.    . ramipril (ALTACE) 10 MG capsule Take 1 capsule by mouth daily.    . simvastatin (ZOCOR) 40 MG tablet Take 1 tablet by mouth daily.    . sotalol (BETAPACE) 80 MG tablet Take 80 mg by mouth 2 (two) times daily.    . sertraline (ZOLOFT) 25 MG tablet TAKE ONE TABLET BY MOUTH ONCE DAILY 90 tablet 0   No current facility-administered medications for this visit.    OBJECTIVE:  Filed Vitals:   06/02/15 1507  BP: 154/67  Pulse: 51  Temp: 96.9 F (36.1 C)     Body mass index is 26.93 kg/(m^2).    ECOG FS:1 - Symptomatic but completely ambulatory  PHYSICAL EXAM: GENERAL:  Well developed, well nourished, sitting comfortably in the exam room in no acute distress. MENTAL STATUS:  Alert and oriented to person, place and time. HEAD:  Normocephalic, atraumatic, face symmetric, no Cushingoid features.  RESPIRATORY:  Clear to auscultation without rales, wheezes or rhonchi. CARDIOVASCULAR:  Regular rate and rhythm without murmur, rub or gallop.  ABDOMEN:  Soft, non-tender, with active bowel sounds, and no hepatosplenomegaly.  No masses. BACK:  No CVA tenderness.  No  tenderness on percussion of the back or rib cage. SKIN:  No rashes, ulcers or lesions. EXTREMITIES: No edema, no skin discoloration or tenderness.  No palpable cords. LYMPH NODES: No palpable cervical, supraclavicular, axillary or inguinal adenopathy  NEUROLOGICAL: Unremarkable. PSYCH:  Appropriate.   LAB RESULTS:  Appointment on 06/02/2015  Component Date Value Ref Range Status  . WBC 06/02/2015 6.1  3.6 - 11.0 K/uL Final  . RBC 06/02/2015 4.15  3.80 - 5.20 MIL/uL Final  . Hemoglobin 06/02/2015 13.2  12.0 - 16.0 g/dL Final  . HCT 06/02/2015 39.8  35.0 - 47.0 % Final  . MCV 06/02/2015 95.8  80.0 - 100.0 fL Final  . MCH 06/02/2015 31.7  26.0 - 34.0 pg Final  . MCHC 06/02/2015 33.1  32.0 - 36.0 g/dL Final  . RDW 06/02/2015 13.4  11.5 - 14.5 % Final  . Platelets 06/02/2015 262  150 -  440 K/uL Final  . Neutrophils Relative % 06/02/2015 48   Final  . Neutro Abs 06/02/2015 2.9  1.4 - 6.5 K/uL Final  . Lymphocytes Relative 06/02/2015 34   Final  . Lymphs Abs 06/02/2015 2.1  1.0 - 3.6 K/uL Final  . Monocytes Relative 06/02/2015 9   Final  . Monocytes Absolute 06/02/2015 0.6  0.2 - 0.9 K/uL Final  . Eosinophils Relative 06/02/2015 8   Final  . Eosinophils Absolute 06/02/2015 0.5  0 - 0.7 K/uL Final  . Basophils Relative 06/02/2015 1   Final  . Basophils Absolute 06/02/2015 0.1  0 - 0.1 K/uL Final  . Sodium 06/02/2015 133* 135 - 145 mmol/L Final  . Potassium 06/02/2015 4.1  3.5 - 5.1 mmol/L Final  . Chloride 06/02/2015 98* 101 - 111 mmol/L Final  . CO2 06/02/2015 30  22 - 32 mmol/L Final  . Glucose, Bld 06/02/2015 91  65 - 99 mg/dL Final  . BUN 06/02/2015 24* 6 - 20 mg/dL Final  . Creatinine, Ser 06/02/2015 0.91  0.44 - 1.00 mg/dL Final  . Calcium 06/02/2015 8.7* 8.9 - 10.3 mg/dL Final  . Total Protein 06/02/2015 7.7  6.5 - 8.1 g/dL Final  . Albumin 06/02/2015 4.5  3.5 - 5.0 g/dL Final  . AST 06/02/2015 22  15 - 41 U/L Final  . ALT 06/02/2015 17  14 - 54 U/L Final  . Alkaline  Phosphatase 06/02/2015 72  38 - 126 U/L Final  . Total Bilirubin 06/02/2015 0.8  0.3 - 1.2 mg/dL Final  . GFR calc non Af Amer 06/02/2015 >60  >60 mL/min Final  . GFR calc Af Amer 06/02/2015 >60  >60 mL/min Final   Comment: (NOTE) The eGFR has been calculated using the CKD EPI equation. This calculation has not been validated in all clinical situations. eGFR's persistently <60 mL/min signify possible Chronic Kidney Disease.   . Anion gap 06/02/2015 5  5 - 15 Final     Surgical Pathology  CASE: [id number]  PATIENT: Moshe Cipro  Surgical Pathology Report      SPECIMEN SUBMITTED:  A. Stomach, antrum and body, cbx  B. GEJ at 38 cm, cbx  C. Stomach polyp, greater curvature, cbx  D. Esophagus at 35 cm, cbx  E. Esophagus at 29 to 30 cm, cbx  F. Esophagus at 23 cm, white plaque material   CLINICAL HISTORY:  None provided   PRE-OPERATIVE DIAGNOSIS:  Dysphagia   POST-OPERATIVE DIAGNOSIS:  None provided      DIAGNOSIS:  A. STOMACH, ANTRUM AND BODY; COLD BIOPSY:  - MILD CHRONIC INACTIVE GASTRITIS.  - NO ATROPHY, INTESTINAL METAPLASIA, DYSPLASIA, OR MALIGNANCY.  - NO HELICOBACTER PYLORI SEEN IN HEMATOXYLIN AND EOSIN SECTIONS.   B. GASTROESOPHAGEAL JUNCTION AT 38 CM; COLD BIOPSY:  - SQUAMOCOLUMNAR MUCOSA WITH MILD CHRONIC ACTIVE INFLAMMATION.  - NEGATIVE FOR GOBLET CELLS, DYSPLASIA, AND MALIGNANCY.   C. STOMACH POLYP, GREATER CURVATURE; COLD BIOPSY:  - FUNDIC GLAND POLYP.  - NEGATIVE FOR DYSPLASIA AND MALIGNANCY.   D. ESOPHAGUS AT 35 CM; COLDBIOPSY:  - STRATIFIED SQUAMOUS EPITHELIUM WITH MILD BASAL ATYPIA SUSPICIOUS FOR  LOW-GRADE DYSPLASIA.   E. ESOPHAGUS AT 29-30 CM; COLD BIOPSY:  - HIGH-GRADE SQUAMOUS DYSPLASIA IN 3 OF 4 SAMPLES.   F. ESOPHAGUS AT 23 CM, WHITE PLAQUE; COLD BIOPSY:  - HIGH-GRADE SQUAMOUS DYSPLASIA, KERATINIZING.             ASSESSMENT: Carcinoma of esophagus squamous cell carcinoma stage TIb N0 M0 tumor status post chemoradiation  therapy Patient has now multiple lady of low-grade and high-grade dysplastic changes at 23, 3035 cm in esophagus.  MEDICAL DECISION MAKING:  I had detailed discussion with these findings with the patient.  PET scan has been reviewed which was done in April of 2016.  Which was negative for any increased metabolic uptake. I discussed situation with gastroenterologist Patient is being referred at [facility] for possibility of radiofrequency or cryoablation Patient will have to get upper endoscopy done very frequently  Patient expressed understanding and was in agreement with this plan. She also understands that She can call clinic at any time with any questions, concerns, or complaints.    No matching staging information was found for the patient.  Forest Gleason, MD   06/02/2015 9:08 PM

## 2015-06-03 ENCOUNTER — Inpatient Hospital Stay: Payer: Medicare Other | Attending: Oncology

## 2015-06-04 ENCOUNTER — Inpatient Hospital Stay: Payer: Medicare Other

## 2015-06-04 DIAGNOSIS — Z85038 Personal history of other malignant neoplasm of large intestine: Secondary | ICD-10-CM | POA: Diagnosis not present

## 2015-06-04 DIAGNOSIS — Z95828 Presence of other vascular implants and grafts: Secondary | ICD-10-CM

## 2015-06-04 MED ORDER — SODIUM CHLORIDE 0.9 % IJ SOLN
10.0000 mL | INTRAMUSCULAR | Status: DC | PRN
  Administered 2015-06-04: 10 mL via INTRAVENOUS
  Filled 2015-06-04: qty 10

## 2015-06-04 MED ORDER — HEPARIN SOD (PORK) LOCK FLUSH 100 UNIT/ML IV SOLN
500.0000 [IU] | Freq: Once | INTRAVENOUS | Status: AC
  Administered 2015-06-04: 500 [IU] via INTRAVENOUS
  Filled 2015-06-04: qty 5

## 2015-06-09 ENCOUNTER — Other Ambulatory Visit: Payer: Self-pay

## 2015-06-10 ENCOUNTER — Other Ambulatory Visit: Payer: Self-pay | Admitting: Family Medicine

## 2015-06-10 DIAGNOSIS — F329 Major depressive disorder, single episode, unspecified: Secondary | ICD-10-CM

## 2015-06-10 DIAGNOSIS — F32A Depression, unspecified: Secondary | ICD-10-CM

## 2015-06-11 ENCOUNTER — Other Ambulatory Visit: Payer: Self-pay

## 2015-06-11 ENCOUNTER — Ambulatory Visit (INDEPENDENT_AMBULATORY_CARE_PROVIDER_SITE_OTHER): Payer: Medicare Other | Admitting: Family Medicine

## 2015-06-11 ENCOUNTER — Encounter: Payer: Self-pay | Admitting: Family Medicine

## 2015-06-11 VITALS — BP 150/92 | HR 61 | Temp 97.5°F | Resp 16 | Wt 175.0 lb

## 2015-06-11 DIAGNOSIS — F329 Major depressive disorder, single episode, unspecified: Secondary | ICD-10-CM

## 2015-06-11 DIAGNOSIS — I10 Essential (primary) hypertension: Secondary | ICD-10-CM

## 2015-06-11 DIAGNOSIS — E784 Other hyperlipidemia: Secondary | ICD-10-CM

## 2015-06-11 DIAGNOSIS — F32A Depression, unspecified: Secondary | ICD-10-CM

## 2015-06-11 DIAGNOSIS — K219 Gastro-esophageal reflux disease without esophagitis: Secondary | ICD-10-CM | POA: Diagnosis not present

## 2015-06-11 DIAGNOSIS — E7849 Other hyperlipidemia: Secondary | ICD-10-CM

## 2015-06-11 MED ORDER — BUPROPION HCL ER (SR) 150 MG PO TB12: 150.0000 mg | ORAL_TABLET | Freq: Two times a day (BID) | ORAL | Status: DC

## 2015-06-11 MED ORDER — SERTRALINE HCL 25 MG PO TABS: 25.0000 mg | ORAL_TABLET | Freq: Every day | ORAL | Status: DC

## 2015-06-11 MED ORDER — HYDROCHLOROTHIAZIDE 25 MG PO TABS: 25.0000 mg | ORAL_TABLET | Freq: Every day | ORAL | Status: DC

## 2015-06-11 MED ORDER — SIMVASTATIN 40 MG PO TABS: 40.0000 mg | ORAL_TABLET | Freq: Every day | ORAL | Status: DC

## 2015-06-11 MED ORDER — RAMIPRIL 10 MG PO CAPS: 10.0000 mg | ORAL_CAPSULE | Freq: Every day | ORAL | Status: DC

## 2015-06-11 NOTE — Progress Notes (Signed)
Name: Kelly Krause   MRN: [id number]    DOB: 1941-08-25   Date:06/11/2015       Progress Note  Subjective  Chief Complaint  Chief Complaint  Patient presents with  . Medication Refill    Hypertension This is a chronic problem. The current episode started more than 1 year ago. The problem has been gradually improving since onset. Pertinent negatives include no anxiety, blurred vision, chest pain, headaches, malaise/fatigue, neck pain, orthopnea, palpitations, peripheral edema, PND, shortness of breath or sweats. There are no associated agents to hypertension. There are no known risk factors for coronary artery disease. Past treatments include nothing. The current treatment provides mild improvement. There is no history of angina, kidney disease, CAD/MI, CVA, heart failure, left ventricular hypertrophy, PVD or retinopathy. There is no history of chronic renal disease.  Hyperlipidemia This is a recurrent problem. The problem is uncontrolled. Recent lipid tests were reviewed and are normal. She has no history of chronic renal disease. There are no known factors aggravating her hyperlipidemia. Pertinent negatives include no chest pain or shortness of breath. She is currently on no antihyperlipidemic treatment. The current treatment provides moderate improvement of lipids. There are no compliance problems.  Risk factors for coronary artery disease include dyslipidemia.  Mental Health Problem The primary symptoms include dysphoric mood. The primary symptoms do not include delusions, hallucinations, bizarre behavior, disorganized speech, negative symptoms or somatic symptoms. The current episode started more than 1 month ago.  The dysphoric mood began more than 2 weeks ago. The mood has been unchanged since its onset. She characterizes the problem as mild. The mood includes feelings of emptiness. Her change in mood was precipitated by a stressful event.  The degree of incapacity that she is experiencing  as a consequence of her illness is mild. Additional symptoms of the illness include anhedonia. Additional symptoms of the illness do not include no insomnia, no hypersomnia, no appetite change, no unexpected weight change, no fatigue, no agitation, no psychomotor retardation, no feelings of worthlessness, no attention impairment, no euphoric mood, no increased goal-directed activity, no flight of ideas, no inflated self-esteem, no decreased need for sleep, not distractible, no poor judgment, no visual change, no headaches, no abdominal pain or no seizures. She does not admit to suicidal ideas. She does not have a plan to commit suicide. She does not contemplate harming herself. She has not already injured self. She does not contemplate injuring another person. She has not already  injured another person.  Anxiety Presents for follow-up visit. Symptoms include feeling of choking. Patient reports no chest pain, compulsions, confusion, decreased concentration, depressed mood, dizziness, dry mouth, excessive worry, hyperventilation, impotence, insomnia, irritability, malaise, muscle tension, nausea, nervous/anxious behavior, obsessions, palpitations, panic, restlessness, shortness of breath or suicidal ideas. The severity of symptoms is mild. Nothing aggravates the symptoms.   There are no known risk factors. There is no history of anemia, anxiety/panic attacks, arrhythmia, asthma, bipolar disorder, CAD, CHF, chronic lung disease, depression, fibromyalgia, hyperthyroidism or suicide attempts. Past treatments include SSRIs and non-SSRI antidepressants.    No problem-specific assessment & plan notes found for this encounter.   Past Medical History  Diagnosis Date  . Major depressive disorder, recurrent episode, moderate   . Persistent disorder of initiating or maintaining sleep     Past Surgical History  Procedure Laterality Date  . Sinusotomy    . Bunionectomy    . Lobectomy Right   . Partial  hysterectomy    . Abdominal hysterectomy    .  Esophgeal cancer    . Esophagogastroduodenoscopy N/A 04/21/2015    Procedure: ESOPHAGOGASTRODUODENOSCOPY (EGD);  Surgeon: Lollie Sails, MD;  Location: [facility];  Service: Endoscopy;  Laterality: N/A;    Family History  Problem Relation Age of Onset  . Stroke Mother   . Depression Mother   . Alcohol abuse Father   . Stroke Father   . Hypertension Father     History   Social History  . Marital Status: Married    Spouse Name: N/A  . Number of Children: N/A  . Years of Education: N/A   Occupational History  . Not on file.   Social History Main Topics  . Smoking status: Former Research scientist (life sciences)  . Smokeless tobacco: Not on file  . Alcohol Use: Not on file  . Drug Use: Not on file  . Sexual Activity: Not on file   Other Topics Concern  . Not on file   Social History Narrative    Allergies  Allergen Reactions  . Amoxicillin-Pot Clavulanate Itching and Nausea Only  . Codeine Nausea Only and Other (See Comments)    GI Upset headaches  . Demerol [Meperidine] Itching, Nausea Only and Other (See Comments)    GI Upset, Headaches   . Latex Hives and Itching  . Morphine And Related Itching, Nausea Only and Other (See Comments)    headaches  . Oxycodone-Acetaminophen Other (See Comments)  . Penicillins Diarrhea  . Strawberry   . Tramadol Other (See Comments)     GI Upset  . Tapentadol Rash     Review of Systems  Constitutional: Negative for fever, chills, weight loss, malaise/fatigue, diaphoresis, appetite change, irritability, fatigue and unexpected weight change.  HENT: Negative for congestion, ear discharge, ear pain, hearing loss, nosebleeds, sore throat and tinnitus.   Eyes: Negative for blurred vision, double vision, photophobia and pain.  Respiratory: Negative for shortness of breath and stridor.   Cardiovascular: Negative for chest pain, palpitations, orthopnea and PND.  Gastrointestinal: Negative for heartburn,  nausea, vomiting and abdominal pain.  Genitourinary: Negative for dysuria, urgency, frequency and impotence.  Musculoskeletal: Negative for neck pain.  Skin: Negative for itching and rash.  Neurological: Negative for dizziness, seizures, weakness and headaches.  Psychiatric/Behavioral: Positive for dysphoric mood. Negative for suicidal ideas, hallucinations, confusion, decreased concentration and agitation. The patient is not nervous/anxious and does not have insomnia.      Objective  Filed Vitals:   06/11/15 1048  BP: 150/92  Pulse: 61  Temp: 97.5 F (36.4 C)  Resp: 16  Weight: 175 lb (79.379 kg)  SpO2: 99%    Physical Exam  Constitutional: She is well-developed, well-nourished, and in no distress. No distress.  HENT:  Head: Normocephalic and atraumatic.  Right Ear: External ear normal.  Left Ear: External ear normal.  Nose: Nose normal.  Mouth/Throat: Oropharynx is clear and moist.  Eyes: Conjunctivae and EOM are normal. Pupils are equal, round, and reactive to light. Right eye exhibits no discharge. Left eye exhibits no discharge.  Neck: Normal range of motion. Neck supple. No JVD present. No thyromegaly present.  Cardiovascular: Normal rate, regular rhythm, normal heart sounds and intact distal pulses.  Exam reveals no gallop and no friction rub.   No murmur heard. Pulmonary/Chest: Effort normal and breath sounds normal. No respiratory distress. She has no wheezes. She has no rales.  Abdominal: Soft. Bowel sounds are normal. She exhibits no mass. There is no tenderness. There is no guarding.  Musculoskeletal: Normal range of motion. She exhibits no  edema.  Lymphadenopathy:    She has no cervical adenopathy.  Neurological: She is alert. She has normal reflexes.  Skin: Skin is warm and dry. She is not diaphoretic. No erythema.  Psychiatric: Mood and affect normal.      Assessment & Plan  Problem List Items Addressed This Visit      Cardiovascular and Mediastinum    Essential (primary) hypertension - Primary   Relevant Medications   hydrochlorothiazide (HYDRODIURIL) 25 MG tablet   ramipril (ALTACE) 10 MG capsule   simvastatin (ZOCOR) 40 MG tablet   Other Relevant Orders   Renal Function Panel     Digestive   Acid reflux     Other   Familial multiple lipoprotein-type hyperlipidemia   Relevant Medications   hydrochlorothiazide (HYDRODIURIL) 25 MG tablet   ramipril (ALTACE) 10 MG capsule   simvastatin (ZOCOR) 40 MG tablet   Other Relevant Orders   Lipid Profile   Clinical depression   Relevant Medications   buPROPion (WELLBUTRIN SR) 150 MG 12 hr tablet   sertraline (ZOLOFT) 25 MG tablet    Other Visit Diagnoses    Depression        Relevant Medications    buPROPion (WELLBUTRIN SR) 150 MG 12 hr tablet    hydrochlorothiazide (HYDRODIURIL) 25 MG tablet    sertraline (ZOLOFT) 25 MG tablet         Dr. Preslyn Warr [facility]  06/11/2015

## 2015-06-12 ENCOUNTER — Other Ambulatory Visit: Payer: Self-pay

## 2015-06-12 LAB — RENAL FUNCTION PANEL
ALBUMIN: 4.4 g/dL (ref 3.5–4.8)
BUN/Creatinine Ratio: 25 (ref 11–26)
BUN: 21 mg/dL (ref 8–27)
CO2: 26 mmol/L (ref 18–29)
Calcium: 9.1 mg/dL (ref 8.7–10.3)
Chloride: 97 mmol/L (ref 97–108)
Creatinine, Ser: 0.84 mg/dL (ref 0.57–1.00)
GFR calc Af Amer: 79 mL/min/{1.73_m2} (ref >59)
GFR, EST NON AFRICAN AMERICAN: 68 mL/min/{1.73_m2} (ref >59)
Glucose: 89 mg/dL (ref 65–99)
POTASSIUM: 4.6 mmol/L (ref 3.5–5.2)
Phosphorus: 3.9 mg/dL (ref 2.5–4.5)
SODIUM: 141 mmol/L (ref 134–144)

## 2015-06-12 LAB — LIPID PANEL
Chol/HDL Ratio: 3 ratio units (ref 0.0–4.4)
Cholesterol, Total: 215 mg/dL — ABNORMAL HIGH (ref 100–199)
HDL: 72 mg/dL (ref >39)
LDL Calculated: 123 mg/dL — ABNORMAL HIGH (ref 0–99)
TRIGLYCERIDES: 100 mg/dL (ref 0–149)
VLDL Cholesterol Cal: 20 mg/dL (ref 5–40)

## 2015-06-16 ENCOUNTER — Other Ambulatory Visit: Payer: Self-pay

## 2015-06-16 DIAGNOSIS — I471 Supraventricular tachycardia: Secondary | ICD-10-CM | POA: Insufficient documentation

## 2015-06-16 DIAGNOSIS — I6782 Cerebral ischemia: Secondary | ICD-10-CM | POA: Insufficient documentation

## 2015-06-16 DIAGNOSIS — G939 Disorder of brain, unspecified: Secondary | ICD-10-CM | POA: Insufficient documentation

## 2015-06-16 DIAGNOSIS — M479 Spondylosis, unspecified: Secondary | ICD-10-CM | POA: Insufficient documentation

## 2015-06-16 DIAGNOSIS — I4891 Unspecified atrial fibrillation: Secondary | ICD-10-CM | POA: Insufficient documentation

## 2015-06-16 DIAGNOSIS — M2040 Other hammer toe(s) (acquired), unspecified foot: Secondary | ICD-10-CM | POA: Insufficient documentation

## 2015-06-16 DIAGNOSIS — C189 Malignant neoplasm of colon, unspecified: Secondary | ICD-10-CM | POA: Insufficient documentation

## 2015-06-16 DIAGNOSIS — C159 Malignant neoplasm of esophagus, unspecified: Secondary | ICD-10-CM | POA: Insufficient documentation

## 2015-06-16 DIAGNOSIS — K5790 Diverticulosis of intestine, part unspecified, without perforation or abscess without bleeding: Secondary | ICD-10-CM | POA: Insufficient documentation

## 2015-06-16 DIAGNOSIS — I10 Essential (primary) hypertension: Secondary | ICD-10-CM | POA: Insufficient documentation

## 2015-06-16 DIAGNOSIS — L859 Epidermal thickening, unspecified: Secondary | ICD-10-CM | POA: Insufficient documentation

## 2015-06-16 DIAGNOSIS — M5116 Intervertebral disc disorders with radiculopathy, lumbar region: Secondary | ICD-10-CM | POA: Insufficient documentation

## 2015-06-16 DIAGNOSIS — C3490 Malignant neoplasm of unspecified part of unspecified bronchus or lung: Secondary | ICD-10-CM | POA: Insufficient documentation

## 2015-06-16 DIAGNOSIS — M2010 Hallux valgus (acquired), unspecified foot: Secondary | ICD-10-CM | POA: Insufficient documentation

## 2015-06-16 DIAGNOSIS — K2210 Ulcer of esophagus without bleeding: Secondary | ICD-10-CM | POA: Insufficient documentation

## 2015-06-16 DIAGNOSIS — Z8619 Personal history of other infectious and parasitic diseases: Secondary | ICD-10-CM | POA: Insufficient documentation

## 2015-07-01 ENCOUNTER — Other Ambulatory Visit: Payer: Self-pay | Admitting: Family Medicine

## 2015-07-01 DIAGNOSIS — I10 Essential (primary) hypertension: Secondary | ICD-10-CM

## 2015-07-15 ENCOUNTER — Inpatient Hospital Stay: Payer: Medicare Other | Attending: Oncology

## 2015-07-15 VITALS — BP 133/67 | HR 49 | Temp 98.3°F | Resp 20

## 2015-07-15 DIAGNOSIS — C159 Malignant neoplasm of esophagus, unspecified: Secondary | ICD-10-CM | POA: Diagnosis present

## 2015-07-15 DIAGNOSIS — E860 Dehydration: Secondary | ICD-10-CM

## 2015-07-15 LAB — COMPREHENSIVE METABOLIC PANEL
ALT: 14 U/L (ref 14–54)
ANION GAP: 10 (ref 5–15)
AST: 18 U/L (ref 15–41)
Albumin: 3.8 g/dL (ref 3.5–5.0)
Alkaline Phosphatase: 65 U/L (ref 38–126)
BUN: 17 mg/dL (ref 6–20)
CALCIUM: 9 mg/dL (ref 8.9–10.3)
CO2: 31 mmol/L (ref 22–32)
Chloride: 99 mmol/L — ABNORMAL LOW (ref 101–111)
Creatinine, Ser: 0.94 mg/dL (ref 0.44–1.00)
GFR calc Af Amer: >60 mL/min (ref >60)
GFR, EST NON AFRICAN AMERICAN: 58 mL/min — AB (ref >60)
GLUCOSE: 116 mg/dL — AB (ref 65–99)
Potassium: 3.4 mmol/L — ABNORMAL LOW (ref 3.5–5.1)
SODIUM: 140 mmol/L (ref 135–145)
TOTAL PROTEIN: 6.9 g/dL (ref 6.5–8.1)
Total Bilirubin: 0.5 mg/dL (ref 0.3–1.2)

## 2015-07-15 MED ORDER — HEPARIN SOD (PORK) LOCK FLUSH 100 UNIT/ML IV SOLN
500.0000 [IU] | Freq: Once | INTRAVENOUS | Status: AC
  Administered 2015-07-15: 500 [IU] via INTRAVENOUS
  Filled 2015-07-15: qty 5

## 2015-07-15 MED ORDER — SODIUM CHLORIDE 0.9 % IV SOLN
Freq: Once | INTRAVENOUS | Status: AC
  Administered 2015-07-15: 12:00:00 via INTRAVENOUS
  Filled 2015-07-15: qty 1000

## 2015-07-15 MED ORDER — SODIUM CHLORIDE 0.9 % IV SOLN
Freq: Once | INTRAVENOUS | Status: AC
  Administered 2015-07-15: 11:00:00 via INTRAVENOUS
  Filled 2015-07-15: qty 1000

## 2015-07-15 MED ORDER — SODIUM CHLORIDE 0.9 % IJ SOLN
10.0000 mL | INTRAMUSCULAR | Status: DC | PRN
  Administered 2015-07-15: 10 mL via INTRAVENOUS
  Filled 2015-07-15: qty 10

## 2015-07-20 ENCOUNTER — Inpatient Hospital Stay: Payer: Medicare Other

## 2015-07-20 ENCOUNTER — Other Ambulatory Visit: Payer: Self-pay | Admitting: Family Medicine

## 2015-07-20 ENCOUNTER — Telehealth: Payer: Self-pay | Admitting: Oncology

## 2015-07-20 VITALS — BP 132/66 | HR 55 | Temp 96.9°F | Resp 18

## 2015-07-20 DIAGNOSIS — C159 Malignant neoplasm of esophagus, unspecified: Secondary | ICD-10-CM | POA: Diagnosis not present

## 2015-07-20 MED ORDER — SODIUM CHLORIDE 0.9 % IV SOLN
8.0000 mg | Freq: Once | INTRAVENOUS | Status: AC
  Administered 2015-07-20: 8 mg via INTRAVENOUS
  Filled 2015-07-20: qty 4

## 2015-07-20 MED ORDER — SODIUM CHLORIDE 0.9 % IV SOLN: Freq: Once | INTRAVENOUS | Status: DC

## 2015-07-20 MED ORDER — DEXAMETHASONE SODIUM PHOSPHATE 10 MG/ML IJ SOLN
10.0000 mg | Freq: Once | INTRAMUSCULAR | Status: AC
  Administered 2015-07-20: 10 mg via INTRAVENOUS
  Filled 2015-07-20: qty 1

## 2015-07-20 MED ORDER — HEPARIN SOD (PORK) LOCK FLUSH 100 UNIT/ML IV SOLN
500.0000 [IU] | Freq: Once | INTRAVENOUS | Status: AC
  Filled 2015-07-20: qty 5

## 2015-07-20 MED ORDER — SODIUM CHLORIDE 0.9 % IV SOLN
INTRAVENOUS | Status: AC
  Administered 2015-07-20: 12:00:00 via INTRAVENOUS
  Filled 2015-07-20: qty 1000

## 2015-07-20 MED ORDER — SODIUM CHLORIDE 0.9 % IJ SOLN
10.0000 mL | INTRAMUSCULAR | Status: AC | PRN
  Administered 2015-07-20: 10 mL via INTRAVENOUS
  Filled 2015-07-20: qty 10

## 2015-07-20 NOTE — Telephone Encounter (Signed)
She wants to come in to [facility] office today for fluids. Please advise. Thanks.

## 2015-07-20 NOTE — Telephone Encounter (Signed)
MD would like for NP to see pt before receives IVF.

## 2015-07-22 ENCOUNTER — Inpatient Hospital Stay: Payer: Medicare Other

## 2015-07-22 ENCOUNTER — Telehealth: Payer: Self-pay | Admitting: Oncology

## 2015-07-22 ENCOUNTER — Other Ambulatory Visit: Payer: Self-pay | Admitting: *Deleted

## 2015-07-22 VITALS — BP 169/61 | HR 50 | Temp 98.7°F | Resp 18

## 2015-07-22 DIAGNOSIS — C159 Malignant neoplasm of esophagus, unspecified: Secondary | ICD-10-CM

## 2015-07-22 MED ORDER — HEPARIN SOD (PORK) LOCK FLUSH 100 UNIT/ML IV SOLN
INTRAVENOUS | Status: AC
  Filled 2015-07-22: qty 5

## 2015-07-22 MED ORDER — HEPARIN SOD (PORK) LOCK FLUSH 100 UNIT/ML IV SOLN
500.0000 [IU] | Freq: Once | INTRAVENOUS | Status: AC
  Administered 2015-07-22: 500 [IU] via INTRAVENOUS

## 2015-07-22 MED ORDER — SODIUM CHLORIDE 0.9 % IJ SOLN
10.0000 mL | INTRAMUSCULAR | Status: DC | PRN
  Administered 2015-07-22: 10 mL via INTRAVENOUS
  Filled 2015-07-22: qty 10

## 2015-07-22 MED ORDER — SODIUM CHLORIDE 0.9 % IV SOLN
Freq: Once | INTRAVENOUS | Status: AC
  Administered 2015-07-22: 14:00:00 via INTRAVENOUS
  Filled 2015-07-22: qty 1000

## 2015-07-22 NOTE — Telephone Encounter (Signed)
She wants fluids today and also hoping for some sort of liquid pain medicine that can be administered w/fluids because she is in terrible agony and did not sleep at all last night. Also had acid reflux really bad last night.

## 2015-07-22 NOTE — Telephone Encounter (Signed)
IVF only today in [place]. Pt can only take liquid tylenol for pain at this time due to multiple allergies. appt to be scheduled next week to see md with labs.

## 2015-07-24 ENCOUNTER — Inpatient Hospital Stay: Payer: Medicare Other | Attending: Oncology

## 2015-07-24 VITALS — BP 139/63 | HR 54 | Temp 97.3°F | Resp 20

## 2015-07-24 DIAGNOSIS — F329 Major depressive disorder, single episode, unspecified: Secondary | ICD-10-CM | POA: Insufficient documentation

## 2015-07-24 DIAGNOSIS — C159 Malignant neoplasm of esophagus, unspecified: Secondary | ICD-10-CM | POA: Insufficient documentation

## 2015-07-24 DIAGNOSIS — Z79899 Other long term (current) drug therapy: Secondary | ICD-10-CM | POA: Diagnosis not present

## 2015-07-24 DIAGNOSIS — R531 Weakness: Secondary | ICD-10-CM | POA: Diagnosis not present

## 2015-07-24 DIAGNOSIS — Z923 Personal history of irradiation: Secondary | ICD-10-CM | POA: Diagnosis not present

## 2015-07-24 DIAGNOSIS — G4700 Insomnia, unspecified: Secondary | ICD-10-CM | POA: Diagnosis not present

## 2015-07-24 DIAGNOSIS — Z87891 Personal history of nicotine dependence: Secondary | ICD-10-CM | POA: Diagnosis not present

## 2015-07-24 DIAGNOSIS — R1310 Dysphagia, unspecified: Secondary | ICD-10-CM | POA: Diagnosis not present

## 2015-07-24 DIAGNOSIS — Z85038 Personal history of other malignant neoplasm of large intestine: Secondary | ICD-10-CM | POA: Insufficient documentation

## 2015-07-24 DIAGNOSIS — Z9221 Personal history of antineoplastic chemotherapy: Secondary | ICD-10-CM | POA: Diagnosis not present

## 2015-07-24 DIAGNOSIS — R5383 Other fatigue: Secondary | ICD-10-CM | POA: Insufficient documentation

## 2015-07-24 DIAGNOSIS — Z7982 Long term (current) use of aspirin: Secondary | ICD-10-CM | POA: Diagnosis not present

## 2015-07-24 MED ORDER — SODIUM CHLORIDE 0.9 % IV SOLN: 10.0000 mg | Freq: Once | INTRAVENOUS | Status: DC

## 2015-07-24 MED ORDER — SODIUM CHLORIDE 0.9 % IV SOLN
Freq: Once | INTRAVENOUS | Status: AC
  Administered 2015-07-24: 11:00:00 via INTRAVENOUS
  Filled 2015-07-24: qty 1000

## 2015-07-24 MED ORDER — HEPARIN SOD (PORK) LOCK FLUSH 100 UNIT/ML IV SOLN
500.0000 [IU] | Freq: Once | INTRAVENOUS | Status: AC
  Administered 2015-07-24: 500 [IU] via INTRAVENOUS

## 2015-07-24 MED ORDER — SODIUM CHLORIDE 0.9 % IV SOLN
Freq: Once | INTRAVENOUS | Status: AC
  Administered 2015-07-24: 11:00:00 via INTRAVENOUS
  Filled 2015-07-24: qty 4

## 2015-07-24 MED ORDER — SODIUM CHLORIDE 0.9 % IJ SOLN
10.0000 mL | INTRAMUSCULAR | Status: DC | PRN
  Administered 2015-07-24: 10 mL via INTRAVENOUS
  Filled 2015-07-24: qty 10

## 2015-07-24 MED ORDER — SODIUM CHLORIDE 0.9 % IV SOLN: Freq: Once | INTRAVENOUS | Status: DC

## 2015-07-24 MED ORDER — HEPARIN SOD (PORK) LOCK FLUSH 100 UNIT/ML IV SOLN
INTRAVENOUS | Status: AC
  Filled 2015-07-24: qty 5

## 2015-07-28 ENCOUNTER — Inpatient Hospital Stay (HOSPITAL_BASED_OUTPATIENT_CLINIC_OR_DEPARTMENT_OTHER): Payer: Medicare Other

## 2015-07-28 ENCOUNTER — Inpatient Hospital Stay (HOSPITAL_BASED_OUTPATIENT_CLINIC_OR_DEPARTMENT_OTHER): Payer: Medicare Other | Admitting: Oncology

## 2015-07-28 VITALS — BP 143/68 | HR 64 | Temp 95.7°F | Wt 168.1 lb

## 2015-07-28 DIAGNOSIS — R5383 Other fatigue: Secondary | ICD-10-CM

## 2015-07-28 DIAGNOSIS — F329 Major depressive disorder, single episode, unspecified: Secondary | ICD-10-CM

## 2015-07-28 DIAGNOSIS — Z923 Personal history of irradiation: Secondary | ICD-10-CM

## 2015-07-28 DIAGNOSIS — Z85038 Personal history of other malignant neoplasm of large intestine: Secondary | ICD-10-CM

## 2015-07-28 DIAGNOSIS — Z87891 Personal history of nicotine dependence: Secondary | ICD-10-CM

## 2015-07-28 DIAGNOSIS — Z7982 Long term (current) use of aspirin: Secondary | ICD-10-CM

## 2015-07-28 DIAGNOSIS — R531 Weakness: Secondary | ICD-10-CM

## 2015-07-28 DIAGNOSIS — C159 Malignant neoplasm of esophagus, unspecified: Secondary | ICD-10-CM | POA: Diagnosis not present

## 2015-07-28 DIAGNOSIS — R1310 Dysphagia, unspecified: Secondary | ICD-10-CM | POA: Diagnosis not present

## 2015-07-28 DIAGNOSIS — G4700 Insomnia, unspecified: Secondary | ICD-10-CM

## 2015-07-28 DIAGNOSIS — Z79899 Other long term (current) drug therapy: Secondary | ICD-10-CM

## 2015-07-28 DIAGNOSIS — Z9221 Personal history of antineoplastic chemotherapy: Secondary | ICD-10-CM

## 2015-07-28 LAB — COMPREHENSIVE METABOLIC PANEL
ALBUMIN: 4.1 g/dL (ref 3.5–5.0)
ALK PHOS: 69 U/L (ref 38–126)
ALT: 16 U/L (ref 14–54)
ANION GAP: 7 (ref 5–15)
AST: 19 U/L (ref 15–41)
BUN: 15 mg/dL (ref 6–20)
CHLORIDE: 100 mmol/L — AB (ref 101–111)
CO2: 31 mmol/L (ref 22–32)
Calcium: 8.7 mg/dL — ABNORMAL LOW (ref 8.9–10.3)
Creatinine, Ser: 0.94 mg/dL (ref 0.44–1.00)
GFR calc Af Amer: >60 mL/min (ref >60)
GFR calc non Af Amer: 58 mL/min — ABNORMAL LOW (ref >60)
GLUCOSE: 115 mg/dL — AB (ref 65–99)
POTASSIUM: 3.6 mmol/L (ref 3.5–5.1)
SODIUM: 138 mmol/L (ref 135–145)
Total Bilirubin: 0.7 mg/dL (ref 0.3–1.2)
Total Protein: 7.1 g/dL (ref 6.5–8.1)

## 2015-07-28 LAB — CBC WITH DIFFERENTIAL/PLATELET
BASOS PCT: 1 %
Basophils Absolute: 0 10*3/uL (ref 0–0.1)
EOS ABS: 0.2 10*3/uL (ref 0–0.7)
EOS PCT: 4 %
HCT: 40.9 % (ref 35.0–47.0)
HEMOGLOBIN: 13.7 g/dL (ref 12.0–16.0)
Lymphocytes Relative: 26 %
Lymphs Abs: 1.4 10*3/uL (ref 1.0–3.6)
MCH: 31.7 pg (ref 26.0–34.0)
MCHC: 33.4 g/dL (ref 32.0–36.0)
MCV: 95 fL (ref 80.0–100.0)
MONOS PCT: 10 %
Monocytes Absolute: 0.5 10*3/uL (ref 0.2–0.9)
NEUTROS PCT: 59 %
Neutro Abs: 3.2 10*3/uL (ref 1.4–6.5)
PLATELETS: 255 10*3/uL (ref 150–440)
RBC: 4.31 MIL/uL (ref 3.80–5.20)
RDW: 13.5 % (ref 11.5–14.5)
WBC: 5.4 10*3/uL (ref 3.6–11.0)

## 2015-07-28 LAB — MAGNESIUM: Magnesium: 1.9 mg/dL (ref 1.7–2.4)

## 2015-07-28 NOTE — Progress Notes (Signed)
Patient does not have living will.  Former smoker.  Patient had varices cauterized 3 weeks ago.  Has had a lot of pain since then.  Was able today to swallow liquid yogurt.

## 2015-07-28 NOTE — Progress Notes (Signed)
If patient requires IVF, per MD order 1075m NS with '8mg'$  decadron.

## 2015-07-31 ENCOUNTER — Telehealth: Payer: Self-pay | Admitting: *Deleted

## 2015-07-31 NOTE — Telephone Encounter (Signed)
Vivien Rota at [facility] returned phone call and stated that need to fax last MD notes, labs, and demographics. Vivien Rota is aware that patient may call to set up appt for IVF.

## 2015-07-31 NOTE — Telephone Encounter (Signed)
Called Fraser Din that works with Dr. Elberta Leatherwood at the [facility]. Left message to call me back about how to coordinate IVF for this patient if needs fluids while out of town. Awaiting callback.

## 2015-08-02 ENCOUNTER — Encounter: Payer: Self-pay | Admitting: Oncology

## 2015-08-02 NOTE — Progress Notes (Signed)
[facility] @ [facility] [phone]  [phone]     Niketa Turner OB: 10/12/1941  MR#: [id number]  BTD#:[id number]  Patient Care Team: Juline Patch, MD as PCP - General (Family Medicine)  CHIEF COMPLAINT:  Chief Complaint  Patient presents with  . 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 PET scan was positive for hypermetabolic area in esophagus  T1bN17m  stage IB tumor.  Started on radiation and chemotherapy(may of 2015) Possibility of surgical evaluation had been discssed and patient has declined patient has finished radiation and chemotherapy in July of 2015 2.  July 2 016 Upper endoscopy reveals high-grade dysplastic changes in esophagus at 23 cm  And mild dysplastic changes at 35 and 30 cm in esophagus area. No evidence of invasive malignancy     Cancer of esophagus   06/25/2014 Initial Diagnosis Cancer of esophagus    On 74year old lady with a previous history of carcinoma of esophagus, squamous cell type patient underwent radiation chemotherapy. Patient continues to have problems with dysphagia INTERVAL HISTORY:  Recently patient had upper endoscopy done which revealed multiple lady of dysplastic changes. I had discussion with Dr. SAnastasio Champion gastroenterologist and patient is now [facility]  Patient's problem of dysphagia continues but has not difficult and weight. Had a recent PET scanning done which was negative. Here  for further follow-up and treatment consideration. September, 2016 Patient had dysplasia of intermediate esophagus for which patient is undergoing radiofrequency ablation.  Following first treatment patient had significant pain and difficulty swallowing requiring intravenous fluid. Patient's pain is better today.  Patient also is allergic to number of  medication particularly pain medications restricting use of any pain pill Here for further follow-up.  Started swallowing little bit better.  Patient is undergoing radiofrequency ablation every 2 months apart for several times  REVIEW OF SYSTEMS:    general status: Patient is feeling weak and tired.  No change in a performance status.  No chills.  No fever. HEENT: .  No evidence of stomatitis Patient had increasing difficulty swallowing after last radiofrequency ablation procedure.  Requiring IV fluid. Lungs: No cough or shortness of breath Cardiac: No chest pain or paroxysmal nocturnal dyspnea GI: No nausea no vomiting no diarrhea no abdominal pain Dysphagia.  Multiple lady of dysplastic changes in the esophagus and recent upper endoscopy.  Negative PET scan Skin: No rash Lower extremity no swelling Neurological system: No tingling.  No numbness.  No other focal signs Musculoskeletal system no bony pains  As per HPI. Otherwise, a complete review of systems is negatve.  PAST MEDICAL HISTORY: Past Medical History  Diagnosis Date  . Major depressive disorder, recurrent episode, moderate   . Persistent disorder of initiating or maintaining sleep     PAST SURGICAL HISTORY: Past Surgical History  Procedure Laterality Date  . Sinusotomy    . Bunionectomy    . Lobectomy Right   . Partial hysterectomy    . Abdominal hysterectomy    . Esophgeal cancer    . Esophagogastroduodenoscopy N/A 04/21/2015    Procedure: ESOPHAGOGASTRODUODENOSCOPY (EGD);  Surgeon: MLollie Sails MD;  [facility];  Service: Endoscopy;  Laterality: N/A;    FAMILY HISTORY Family History  Problem Relation Age of Onset  . Stroke Mother   . Depression Mother   . Alcohol abuse Father   .  Stroke Father   . Hypertension Father     ADVANCED DIRECTIVES:  No flowsheet data found.  HEALTH MAINTENANCE: Social History  Substance Use Topics  . Smoking status: Former Research scientist (life sciences)  . Smokeless tobacco:  None  . Alcohol Use: None      Allergies  Allergen Reactions  . Amoxicillin-Pot Clavulanate Itching and Nausea Only  . Codeine Nausea Only and Other (See Comments)    GI Upset headaches  . Demerol [Meperidine] Itching, Nausea Only and Other (See Comments)    GI Upset, Headaches   . Latex Hives and Itching  . Morphine And Related Itching, Nausea Only and Other (See Comments)    headaches  . Oxycodone-Acetaminophen Other (See Comments)  . Penicillins Diarrhea  . Strawberry   . Tramadol Other (See Comments)     GI Upset  . Buprenorphine Hcl Itching and Nausea Only    Other reaction(s): Other (See Comments) headaches  . Tapentadol Rash    Current Outpatient Prescriptions  Medication Sig Dispense Refill  . amLODipine (NORVASC) 10 MG tablet TAKE ONE TABLET BY MOUTH ONCE DAILY-NEED  APPOINTMENT 90 tablet 0  . aspirin 81 MG tablet Take 1 tablet by mouth daily.    . Biotin 300 MCG TABS Take 1 tablet by mouth daily.    Marland Kitchen buPROPion (WELLBUTRIN SR) 150 MG 12 hr tablet TAKE ONE TABLET BY MOUTH TWICE DAILY 180 tablet 0  . buPROPion (WELLBUTRIN SR) 150 MG 12 hr tablet Take 1 tablet (150 mg total) by mouth 2 (two) times daily. 180 tablet 2  . cetirizine (ZYRTEC) 10 MG tablet Take 1 tablet by mouth 2 (two) times daily.     Marland Kitchen docusate sodium (COLACE) 100 MG capsule Take 200 mg by mouth daily.    Marland Kitchen gabapentin (NEURONTIN) 100 MG capsule Take 2 capsules by mouth 2 (two) times daily. As needed    . hydrochlorothiazide (HYDRODIURIL) 25 MG tablet Take 1 tablet (25 mg total) by mouth daily. 90 tablet 2  . Multiple Vitamin tablet Take 1 tablet by mouth daily.    . Nutritional Supplements (JUICE PLUS FIBRE PO) Take 2 tablets by mouth daily.    . pantoprazole (PROTONIX) 40 MG tablet Take 40 mg by mouth 3 (three) times daily.    . polyethylene glycol powder (GLYCOLAX/MIRALAX) powder Take 17 g by mouth at bedtime as needed for moderate constipation.    . ramipril (ALTACE) 10 MG capsule Take 1 capsule (10  mg total) by mouth daily. 90 capsule 2  . sertraline (ZOLOFT) 25 MG tablet Take 1 tablet (25 mg total) by mouth daily. 90 tablet 2  . simvastatin (ZOCOR) 40 MG tablet Take 1 tablet (40 mg total) by mouth daily. 90 tablet 2  . sotalol (BETAPACE) 80 MG tablet Take 80 mg by mouth 2 (two) times daily.    . sucralfate (CARAFATE) 1 GM/10ML suspension Take by mouth.     No current facility-administered medications for this visit.   Facility-Administered Medications Ordered in Other Visits  Medication Dose Route Frequency Provider Last Rate Last Dose  . 0.9 %  sodium chloride infusion   Intravenous Continuous Evlyn Kanner, NP 999 mL/hr at 07/20/15 1146    . heparin lock flush 100 unit/mL  500 Units Intravenous Once Forest Gleason, MD      . sodium chloride 0.9 % injection 10 mL  10 mL Intravenous PRN Forest Gleason, MD   10 mL at 07/20/15 1147    OBJECTIVE:  Filed Vitals:   07/28/15 1548  BP: 143/68  Pulse: 64  Temp: 95.7 F (35.4 C)     Body mass index is 25.93 kg/(m^2).    ECOG FS:1 - Symptomatic but completely ambulatory  PHYSICAL EXAM: GENERAL:  Well developed, well nourished, sitting comfortably in the exam room in no acute distress. MENTAL STATUS:  Alert and oriented to person, place and time. HEAD:  Normocephalic, atraumatic, face symmetric, no Cushingoid features.   RESPIRATORY:  Clear to auscultation without rales, wheezes or rhonchi. CARDIOVASCULAR:  Regular rate and rhythm without murmur, rub or gallop.  ABDOMEN:  Soft, non-tender, with active bowel sounds, and no hepatosplenomegaly.  No masses. BACK:  No CVA tenderness.  No tenderness on percussion of the back or rib cage. SKIN:  No rashes, ulcers or lesions. EXTREMITIES: No edema, no skin discoloration or tenderness.  No palpable cords. LYMPH NODES: No palpable cervical, supraclavicular, axillary or inguinal adenopathy  NEUROLOGICAL: Unremarkable. PSYCH:  Appropriate.   LAB RESULTS:  Appointment on 07/28/2015    Component Date Value Ref Range Status  . WBC 07/28/2015 5.4  3.6 - 11.0 K/uL Final  . RBC 07/28/2015 4.31  3.80 - 5.20 MIL/uL Final  . Hemoglobin 07/28/2015 13.7  12.0 - 16.0 g/dL Final  . HCT 07/28/2015 40.9  35.0 - 47.0 % Final  . MCV 07/28/2015 95.0  80.0 - 100.0 fL Final  . MCH 07/28/2015 31.7  26.0 - 34.0 pg Final  . MCHC 07/28/2015 33.4  32.0 - 36.0 g/dL Final  . RDW 07/28/2015 13.5  11.5 - 14.5 % Final  . Platelets 07/28/2015 255  150 - 440 K/uL Final  . Neutrophils Relative % 07/28/2015 59   Final  . Neutro Abs 07/28/2015 3.2  1.4 - 6.5 K/uL Final  . Lymphocytes Relative 07/28/2015 26   Final  . Lymphs Abs 07/28/2015 1.4  1.0 - 3.6 K/uL Final  . Monocytes Relative 07/28/2015 10   Final  . Monocytes Absolute 07/28/2015 0.5  0.2 - 0.9 K/uL Final  . Eosinophils Relative 07/28/2015 4   Final  . Eosinophils Absolute 07/28/2015 0.2  0 - 0.7 K/uL Final  . Basophils Relative 07/28/2015 1   Final  . Basophils Absolute 07/28/2015 0.0  0 - 0.1 K/uL Final  . Sodium 07/28/2015 138  135 - 145 mmol/L Final  . Potassium 07/28/2015 3.6  3.5 - 5.1 mmol/L Final  . Chloride 07/28/2015 100* 101 - 111 mmol/L Final  . CO2 07/28/2015 31  22 - 32 mmol/L Final  . Glucose, Bld 07/28/2015 115* 65 - 99 mg/dL Final  . BUN 07/28/2015 15  6 - 20 mg/dL Final  . Creatinine, Ser 07/28/2015 0.94  0.44 - 1.00 mg/dL Final  . Calcium 07/28/2015 8.7* 8.9 - 10.3 mg/dL Final  . Total Protein 07/28/2015 7.1  6.5 - 8.1 g/dL Final  . Albumin 07/28/2015 4.1  3.5 - 5.0 g/dL Final  . AST 07/28/2015 19  15 - 41 U/L Final  . ALT 07/28/2015 16  14 - 54 U/L Final  . Alkaline Phosphatase 07/28/2015 69  38 - 126 U/L Final  . Total Bilirubin 07/28/2015 0.7  0.3 - 1.2 mg/dL Final  . GFR calc non Af Amer 07/28/2015 58* >60 mL/min Final  . GFR calc Af Amer 07/28/2015 >60  >60 mL/min Final   Comment: (NOTE) The eGFR has been calculated using the CKD EPI equation. This calculation has not been validated in all clinical  situations. eGFR's persistently <60 mL/min signify possible Chronic Kidney Disease.   . Anion gap  07/28/2015 7  5 - 15 Final  . Magnesium 07/28/2015 1.9  1.7 - 2.4 mg/dL Final     Surgical Pathology  CASE: [id number]  PATIENT: Moshe Cipro  Surgical Pathology Report      SPECIMEN SUBMITTED:  A. Stomach, antrum and body, cbx  B. GEJ at 38 cm, cbx  C. Stomach polyp, greater curvature, cbx  D. Esophagus at 35 cm, cbx  E. Esophagus at 29 to 30 cm, cbx  F. Esophagus at 23 cm, white plaque material   CLINICAL HISTORY:  None provided   PRE-OPERATIVE DIAGNOSIS:  Dysphagia   POST-OPERATIVE DIAGNOSIS:  None provided      DIAGNOSIS:  A. STOMACH, ANTRUM AND BODY; COLD BIOPSY:  - MILD CHRONIC INACTIVE GASTRITIS.  - NO ATROPHY, INTESTINAL METAPLASIA, DYSPLASIA, OR MALIGNANCY.  - NO HELICOBACTER PYLORI SEEN IN HEMATOXYLIN AND EOSIN SECTIONS.   B. GASTROESOPHAGEAL JUNCTION AT 38 CM; COLD BIOPSY:  - SQUAMOCOLUMNAR MUCOSA WITH MILD CHRONIC ACTIVE INFLAMMATION.  - NEGATIVE FOR GOBLET CELLS, DYSPLASIA, AND MALIGNANCY.   C. STOMACH POLYP, GREATER CURVATURE; COLD BIOPSY:  - FUNDIC GLAND POLYP.  - NEGATIVE FOR DYSPLASIA AND MALIGNANCY.   D. ESOPHAGUS AT 35 CM; COLDBIOPSY:  - STRATIFIED SQUAMOUS EPITHELIUM WITH MILD BASAL ATYPIA SUSPICIOUS FOR  LOW-GRADE DYSPLASIA.   E. ESOPHAGUS AT 29-30 CM; COLD BIOPSY:  - HIGH-GRADE SQUAMOUS DYSPLASIA IN 3 OF 4 SAMPLES.   F. ESOPHAGUS AT 23 CM, WHITE PLAQUE; COLD BIOPSY:  - HIGH-GRADE SQUAMOUS DYSPLASIA, KERATINIZING.             ASSESSMENT: Carcinoma of esophagus squamous cell carcinoma stage TIb N0 M0 tumor status post chemoradiation therapy Patient has now multiplearea  of low-grade and high-grade dysplastic changes at 23, 3035 cm in esophagus. Radiofrequency ablation  MEDICAL DECISION MAKING:  I had detailed discussion with these findings with the patient.  PET scan has been reviewed which was done in April of 2016.   Which was negative for any increased metabolic uptake. I discussed situation with gastroenterologist Patient is being referred at [facility] for possibility of radiofrequency or cryoablation Patient will have to get upper endoscopy done very frequently The first radiofrequency ablation procedure patient had difficulty swallowing and requiring IV fluid.  The patient is feeling better.  So patient was advised to continue present approach.  Was advised to check with gastroenterologist at [facility] to see whether use of steroid permitted after radiofrequency ablation to reduce inflammation and pain Patients on with the gynecologists also wanted to see if Dilaudid or methadone would work for the pain.  Patient has reported to be allergy to number of narcotic medication in the past.  As pain now getting better we may not help to use narcotics.  Patient was instructed to call me if pain continues to bother otherwise advised to take Tylenol at present time on regular basis.  Or nonsteroidal medication if tolerated.. Total duration of visit was 25 minutes.  50% or more time was spent in counseling patient and family regarding prognosis and options of treatment and available resources Patient expressed understanding and was in agreement with this plan. She also understands that She can call clinic at any time with any questions, concerns, or complaints.    No matching staging information was found for the patient.  Forest Gleason, MD   08/02/2015 9:44 AM

## 2015-08-17 ENCOUNTER — Other Ambulatory Visit: Payer: Self-pay

## 2015-08-26 ENCOUNTER — Inpatient Hospital Stay: Payer: Medicare Other | Attending: Oncology

## 2015-08-26 DIAGNOSIS — R5383 Other fatigue: Secondary | ICD-10-CM | POA: Insufficient documentation

## 2015-08-26 DIAGNOSIS — Z9221 Personal history of antineoplastic chemotherapy: Secondary | ICD-10-CM | POA: Insufficient documentation

## 2015-08-26 DIAGNOSIS — Z85038 Personal history of other malignant neoplasm of large intestine: Secondary | ICD-10-CM | POA: Insufficient documentation

## 2015-08-26 DIAGNOSIS — C159 Malignant neoplasm of esophagus, unspecified: Secondary | ICD-10-CM | POA: Insufficient documentation

## 2015-08-26 DIAGNOSIS — Z923 Personal history of irradiation: Secondary | ICD-10-CM | POA: Insufficient documentation

## 2015-08-26 DIAGNOSIS — R531 Weakness: Secondary | ICD-10-CM | POA: Insufficient documentation

## 2015-08-26 DIAGNOSIS — R1310 Dysphagia, unspecified: Secondary | ICD-10-CM | POA: Insufficient documentation

## 2015-08-26 DIAGNOSIS — R634 Abnormal weight loss: Secondary | ICD-10-CM | POA: Insufficient documentation

## 2015-08-26 DIAGNOSIS — F329 Major depressive disorder, single episode, unspecified: Secondary | ICD-10-CM | POA: Insufficient documentation

## 2015-08-26 DIAGNOSIS — Z79899 Other long term (current) drug therapy: Secondary | ICD-10-CM | POA: Insufficient documentation

## 2015-09-14 ENCOUNTER — Inpatient Hospital Stay: Payer: Medicare Other

## 2015-09-14 ENCOUNTER — Inpatient Hospital Stay (HOSPITAL_BASED_OUTPATIENT_CLINIC_OR_DEPARTMENT_OTHER): Payer: Medicare Other | Admitting: Oncology

## 2015-09-14 VITALS — BP 178/70 | HR 48 | Temp 95.9°F | Wt 161.6 lb

## 2015-09-14 DIAGNOSIS — R531 Weakness: Secondary | ICD-10-CM

## 2015-09-14 DIAGNOSIS — C159 Malignant neoplasm of esophagus, unspecified: Secondary | ICD-10-CM

## 2015-09-14 DIAGNOSIS — R5383 Other fatigue: Secondary | ICD-10-CM | POA: Diagnosis not present

## 2015-09-14 DIAGNOSIS — F329 Major depressive disorder, single episode, unspecified: Secondary | ICD-10-CM

## 2015-09-14 DIAGNOSIS — Z923 Personal history of irradiation: Secondary | ICD-10-CM

## 2015-09-14 DIAGNOSIS — Z9221 Personal history of antineoplastic chemotherapy: Secondary | ICD-10-CM

## 2015-09-14 DIAGNOSIS — R1310 Dysphagia, unspecified: Secondary | ICD-10-CM

## 2015-09-14 DIAGNOSIS — Z79899 Other long term (current) drug therapy: Secondary | ICD-10-CM

## 2015-09-14 DIAGNOSIS — R634 Abnormal weight loss: Secondary | ICD-10-CM | POA: Diagnosis not present

## 2015-09-14 DIAGNOSIS — Z85038 Personal history of other malignant neoplasm of large intestine: Secondary | ICD-10-CM

## 2015-09-14 LAB — CBC WITH DIFFERENTIAL/PLATELET
Basophils Absolute: 0 10*3/uL (ref 0–0.1)
Basophils Relative: 1 %
EOS ABS: 0.2 10*3/uL (ref 0–0.7)
EOS PCT: 4 %
HEMATOCRIT: 39.3 % (ref 35.0–47.0)
HEMOGLOBIN: 13.1 g/dL (ref 12.0–16.0)
LYMPHS ABS: 1.8 10*3/uL (ref 1.0–3.6)
Lymphocytes Relative: 34 %
MCH: 31.1 pg (ref 26.0–34.0)
MCHC: 33.3 g/dL (ref 32.0–36.0)
MCV: 93.4 fL (ref 80.0–100.0)
Monocytes Absolute: 0.5 10*3/uL (ref 0.2–0.9)
Monocytes Relative: 10 %
Neutro Abs: 2.7 10*3/uL (ref 1.4–6.5)
Neutrophils Relative %: 51 %
PLATELETS: 253 10*3/uL (ref 150–440)
RBC: 4.21 MIL/uL (ref 3.80–5.20)
RDW: 13 % (ref 11.5–14.5)
WBC: 5.3 10*3/uL (ref 4.0–10.5)

## 2015-09-14 LAB — COMPREHENSIVE METABOLIC PANEL
ALK PHOS: 62 U/L (ref 38–126)
ALT: 14 U/L (ref 14–54)
AST: 20 U/L (ref 15–41)
Albumin: 3.9 g/dL (ref 3.5–5.0)
Anion gap: 6 (ref 5–15)
BILIRUBIN TOTAL: 0.6 mg/dL (ref 0.3–1.2)
BUN: 12 mg/dL (ref 6–20)
CALCIUM: 8.4 mg/dL — AB (ref 8.9–10.3)
CO2: 29 mmol/L (ref 22–32)
CREATININE: 0.77 mg/dL (ref 0.44–1.00)
Chloride: 102 mmol/L (ref 101–111)
Glucose, Bld: 129 mg/dL — ABNORMAL HIGH (ref 65–99)
Potassium: 3.5 mmol/L (ref 3.5–5.1)
Sodium: 137 mmol/L (ref 135–145)
Total Protein: 6.7 g/dL (ref 6.5–8.1)

## 2015-09-14 MED ORDER — HEPARIN SOD (PORK) LOCK FLUSH 100 UNIT/ML IV SOLN
INTRAVENOUS | Status: AC
  Filled 2015-09-14: qty 5

## 2015-09-14 MED ORDER — SODIUM CHLORIDE 0.9 % IJ SOLN
10.0000 mL | INTRAMUSCULAR | Status: DC | PRN
  Administered 2015-09-14: 10 mL
  Filled 2015-09-14: qty 10

## 2015-09-14 MED ORDER — HEPARIN SOD (PORK) LOCK FLUSH 100 UNIT/ML IV SOLN
500.0000 [IU] | Freq: Once | INTRAVENOUS | Status: AC
  Administered 2015-09-14: 500 [IU] via INTRAVENOUS

## 2015-09-14 NOTE — Progress Notes (Signed)
Patient c/o her port hurting and burning.  States it burned when accessed.  States her esophagus burns and she has problems swallowing food, liquids and pills.  Also will be seeing Dr. Candiss Norse this week to have her esophagus stretched.

## 2015-09-14 NOTE — Progress Notes (Signed)
Pt stated is having procedure tomorrow with Dr. Adria Devon and would like PAC to remain accessed at this. PAC was flushed with heparin and Dr. Samuel Jester office will deaccess port after procedure.

## 2015-09-15 ENCOUNTER — Other Ambulatory Visit: Payer: Self-pay

## 2015-09-15 ENCOUNTER — Ambulatory Visit: Payer: Self-pay | Admitting: Oncology

## 2015-09-20 ENCOUNTER — Encounter: Payer: Self-pay | Admitting: Oncology

## 2015-09-20 NOTE — Progress Notes (Signed)
[place] @ [facility] [phone]) [phone]  Fax:(336) [phone]     Kelly Krause OB: 1941/09/26  MR#: [id number]  TLX#:[id number]  Patient Care Team: Juline Patch, MD as PCP - General (Family Medicine)  CHIEF COMPLAINT:  Chief Complaint  Patient presents with  . 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 PET scan was positive for hypermetabolic area in esophagus  [id number]  stage IB tumor.  Started on radiation and chemotherapy(may of 2015) Possibility of surgical evaluation had been discssed and patient has declined patient has finished radiation and chemotherapy in July of 2015 2.  July 2 016 Upper endoscopy reveals high-grade dysplastic changes in esophagus at 23 cm  And mild dysplastic changes at 35 and 30 cm in esophagus area. No evidence of invasive malignancy     Cancer of esophagus ([place]   06/25/2014 Initial Diagnosis Cancer of esophagus    On 74year old lady with a previous history of carcinoma of esophagus, squamous cell type patient underwent radiation chemotherapy. Patient continues to have problems with dysphagia INTERVAL HISTORY:  Recently patient had upper endoscopy done which revealed multiple lady of dysplastic changes. I had discussion with Dr. SAnastasio Champion gastroenterologist and patient is now being [facility]  Patient's problem of dysphagia continues but has not difficult and weight. Had a recent PET scanning done which was negative. Here  for further follow-up and treatment consideration. Patient continues to lose weight continues to have difficulty swallowing as under gone  radiofrequency ablation and dilated . Has lost 7 pounds since last evaluation Biopsies had been planned during the next procedure. Here for further follow-up and treatment consideration  REVIEW OF SYSTEMS:    general status: Patient is feeling weak and tired.  No change in a performance status.  No chills.  No fever. HEENT: .  No evidence of stomatitis Patient had increasing difficulty swallowing after last radiofrequency ablation procedure.  Requiring IV fluid. Lungs: No cough or shortness of breath Cardiac: No chest pain or paroxysmal nocturnal dyspnea GI: No nausea no vomiting no diarrhea no abdominal pain Dysphagia.  Multiple lady of dysplastic changes in the esophagus and recent upper endoscopy.  Negative PET scan Skin: No rash Lower extremity no swelling Neurological system: No tingling.  No numbness.  No other focal signs Musculoskeletal system no bony pains  As per HPI. Otherwise, a complete review of systems is negatve.  PAST MEDICAL HISTORY: Past Medical History  Diagnosis Date  . Major depressive disorder[place] episode, [place]   . Persistent disorder of initiating or maintaining sleep     PAST SURGICAL HISTORY: Past Surgical History  Procedure Laterality Date  . Sinusotomy    . Bunionectomy    . Lobectomy Right   . Partial hysterectomy    . Abdominal hysterectomy    . Esophgeal cancer    . Esophagogastroduodenoscopy N/A 04/21/2015    Procedure: ESOPHAGOGASTRODUODENOSCOPY (EGD);  Surgeon: MLollie Sails MD;  [facility];  Service: Endoscopy;  Laterality: N/A;    FAMILY HISTORY Family History  Problem Relation Age of Onset  . Stroke Mother   . Depression Mother   . Alcohol abuse Father   . Stroke Father   . Hypertension Father     ADVANCED DIRECTIVES:  No flowsheet data found.  HEALTH MAINTENANCE: Social History  Substance Use Topics  . Smoking status: Former SResearch scientist (life sciences) .  Smokeless tobacco: None  . Alcohol Use: None      Allergies  Allergen Reactions  . Amoxicillin-Pot Clavulanate Itching and Nausea Only  . Codeine Nausea Only and Other (See Comments)    GI Upset headaches  . Demerol [Meperidine] Itching, Nausea Only and Other  (See Comments)    GI Upset, Headaches   . Latex Hives and Itching  . Morphine And Related Itching, Nausea Only and Other (See Comments)    headaches  . Oxycodone-Acetaminophen Other (See Comments)  . Penicillins Diarrhea  . Strawberry Extract   . Tramadol Other (See Comments)     GI Upset  . Buprenorphine Hcl Itching and Nausea Only    Other reaction(s): Other (See Comments) headaches  . Tapentadol Rash    Current Outpatient Prescriptions  Medication Sig Dispense Refill  . amLODipine (NORVASC) 10 MG tablet TAKE ONE TABLET BY MOUTH ONCE DAILY-NEED  APPOINTMENT 90 tablet 0  . Biotin 300 MCG TABS Take 1 tablet by mouth daily.    Marland Kitchen buPROPion (WELLBUTRIN SR) 150 MG 12 hr tablet TAKE ONE TABLET BY MOUTH TWICE DAILY 180 tablet 0  . cetirizine (ZYRTEC) 10 MG tablet Take 1 tablet by mouth 2 (two) times daily.     Marland Kitchen docusate sodium (COLACE) 100 MG capsule Take 200 mg by mouth daily.    . hydrochlorothiazide (HYDRODIURIL) 25 MG tablet Take 1 tablet (25 mg total) by mouth daily. 90 tablet 2  . hyoscyamine (LEVSIN, ANASPAZ) 0.125 MG tablet Take 0.125 mg by mouth.    . Multiple Vitamin tablet Take 1 tablet by mouth daily.    . pantoprazole (PROTONIX) 40 MG tablet Take 40 mg by mouth 3 (three) times daily.    . ramipril (ALTACE) 10 MG capsule Take 1 capsule (10 mg total) by mouth daily. 90 capsule 2  . sertraline (ZOLOFT) 25 MG tablet Take 1 tablet (25 mg total) by mouth daily. 90 tablet 2  . simvastatin (ZOCOR) 40 MG tablet Take 1 tablet (40 mg total) by mouth daily. 90 tablet 2  . sotalol (BETAPACE) 80 MG tablet Take 80 mg by mouth 2 (two) times daily.    . sucralfate (CARAFATE) 1 GM/10ML suspension Take by mouth.     No current facility-administered medications for this visit.   Facility-Administered Medications Ordered in Other Visits  Medication Dose Route Frequency Provider Last Rate Last Dose  . 0.9 %  sodium chloride infusion   Intravenous Continuous Evlyn Kanner, NP 999 mL/hr at  07/20/15 1146    . heparin lock flush 100 unit/mL  500 Units Intravenous Once Forest Gleason, MD      . sodium chloride 0.9 % injection 10 mL  10 mL Intravenous PRN Forest Gleason, MD   10 mL at 07/20/15 1147    OBJECTIVE:  Filed Vitals:   09/14/15 1457  BP: 178/70  Pulse: 48  Temp: 95.9 F (35.5 C)     Body mass index is 24.92 kg/(m^2).    ECOG FS:1 - Symptomatic but completely ambulatory  PHYSICAL EXAM: GENERAL:  Well developed, well nourished, sitting comfortably in the exam room in no acute distress. MENTAL STATUS:  Alert and oriented to person, place and time. HEAD:  Normocephalic, atraumatic, face symmetric, no Cushingoid features.   RESPIRATORY:  Clear to auscultation without rales, wheezes or rhonchi. CARDIOVASCULAR:  Regular rate and rhythm without murmur, rub or gallop.  ABDOMEN:  Soft, non-tender, with active bowel sounds, and no hepatosplenomegaly.  No masses. BACK:  No CVA tenderness.  No tenderness on percussion of the back or rib cage. SKIN:  No rashes, ulcers or lesions. EXTREMITIES: No edema, no skin discoloration or tenderness.  No palpable cords. LYMPH NODES: No palpable cervical, supraclavicular, axillary or inguinal adenopathy  NEUROLOGICAL: Unremarkable. PSYCH:  Appropriate.   LAB RESULTS:  Infusion on 09/14/2015  Component Date Value Ref Range Status  . WBC 09/14/2015 5.3  4.0 - 10.5 K/uL Final   A-LINE DRAW  . RBC 09/14/2015 4.21  3.80 - 5.20 MIL/uL Final  . Hemoglobin 09/14/2015 13.1  12.0 - 16.0 g/dL Final  . HCT 09/14/2015 39.3  35.0 - 47.0 % Final  . MCV 09/14/2015 93.4  80.0 - 100.0 fL Final  . MCH 09/14/2015 31.1  26.0 - 34.0 pg Final  . MCHC 09/14/2015 33.3  32.0 - 36.0 g/dL Final  . RDW 09/14/2015 13.0  11.5 - 14.5 % Final  . Platelets 09/14/2015 253  150 - 440 K/uL Final  . Neutrophils Relative % 09/14/2015 51   Final  . Neutro Abs 09/14/2015 2.7  1.4 - 6.5 K/uL Final  . Lymphocytes Relative 09/14/2015 34   Final  . Lymphs Abs 09/14/2015  1.8  1.0 - 3.6 K/uL Final  . Monocytes Relative 09/14/2015 10   Final  . Monocytes Absolute 09/14/2015 0.5  0.2 - 0.9 K/uL Final  . Eosinophils Relative 09/14/2015 4   Final  . Eosinophils Absolute 09/14/2015 0.2  0 - 0.7 K/uL Final  . Basophils Relative 09/14/2015 1   Final  . Basophils Absolute 09/14/2015 0.0  0 - 0.1 K/uL Final  . Sodium 09/14/2015 137  135 - 145 mmol/L Final  . Potassium 09/14/2015 3.5  3.5 - 5.1 mmol/L Final  . Chloride 09/14/2015 102  101 - 111 mmol/L Final  . CO2 09/14/2015 29  22 - 32 mmol/L Final  . Glucose, Bld 09/14/2015 129* 65 - 99 mg/dL Final  . BUN 09/14/2015 12  6 - 20 mg/dL Final  . Creatinine, Ser 09/14/2015 0.77  0.44 - 1.00 mg/dL Final  . Calcium 09/14/2015 8.4* 8.9 - 10.3 mg/dL Final  . Total Protein 09/14/2015 6.7  6.5 - 8.1 g/dL Final  . Albumin 09/14/2015 3.9  3.5 - 5.0 g/dL Final  . AST 09/14/2015 20  15 - 41 U/L Final  . ALT 09/14/2015 14  14 - 54 U/L Final  . Alkaline Phosphatase 09/14/2015 62  38 - 126 U/L Final  . Total Bilirubin 09/14/2015 0.6  0.3 - 1.2 mg/dL Final  . GFR calc non Af Amer 09/14/2015 >60  >60 mL/min Final  . GFR calc Af Amer 09/14/2015 >60  >60 mL/min Final   Comment: (NOTE) The eGFR has been calculated using the CKD EPI equation. This calculation has not been validated in all clinical situations. eGFR's persistently <60 mL/min signify possible Chronic Kidney Disease.   . Anion gap 09/14/2015 6  5 - 15 Final     Surgical Pathology  CASE: [id number]  PATIENT: Kelly Krause  Surgical Pathology Report      SPECIMEN SUBMITTED:  A. Stomach, antrum and body, cbx  B. GEJ at 38 cm, cbx  C. Stomach polyp, greater curvature, cbx  D. Esophagus at 35 cm, cbx  E. Esophagus at 29 to 30 cm, cbx  F. Esophagus at 23 cm, white plaque material   CLINICAL HISTORY:  None provided   PRE-OPERATIVE DIAGNOSIS:  Dysphagia   POST-OPERATIVE DIAGNOSIS:  None provided      DIAGNOSIS:  A. STOMACH, ANTRUM AND BODY; COLD  BIOPSY:  - MILD CHRONIC INACTIVE GASTRITIS.  - NO ATROPHY, INTESTINAL METAPLASIA, DYSPLASIA, OR MALIGNANCY.  - NO HELICOBACTER PYLORI SEEN IN HEMATOXYLIN AND EOSIN SECTIONS.   B. GASTROESOPHAGEAL JUNCTION AT 38 CM; COLD BIOPSY:  - SQUAMOCOLUMNAR MUCOSA WITH MILD CHRONIC ACTIVE INFLAMMATION.  - NEGATIVE FOR GOBLET CELLS, DYSPLASIA, AND MALIGNANCY.   C. STOMACH POLYP, GREATER CURVATURE; COLD BIOPSY:  - FUNDIC GLAND POLYP.  - NEGATIVE FOR DYSPLASIA AND MALIGNANCY.   D. ESOPHAGUS AT 35 CM; COLDBIOPSY:  - STRATIFIED SQUAMOUS EPITHELIUM WITH MILD BASAL ATYPIA SUSPICIOUS FOR  LOW-GRADE DYSPLASIA.   E. ESOPHAGUS AT 29-30 CM; COLD BIOPSY:  - HIGH-GRADE SQUAMOUS DYSPLASIA IN 3 OF 4 SAMPLES.   F. ESOPHAGUS AT 23 CM, WHITE PLAQUE; COLD BIOPSY:  - HIGH-GRADE SQUAMOUS DYSPLASIA, KERATINIZING.             ASSESSMENT: Carcinoma of esophagus squamous cell carcinoma stage TIb N0 M0 tumor status post chemoradiation therapy Patient has now multiplearea  of low-grade and high-grade dysplastic changes at 23, 3035 cm in esophagus. Radiofrequency ablation  MEDICAL DECISION MAKING:  History of carcinoma of esophagus or muscle carcinoma stage IB status post chemoradiation therapy Patient is having multiple dysplastic areas undergoing radiofrequency ablation losing weight Further therapy and biopsies is being planned I discussed possibility of bacterial placement Patient desires to hold off that.  We also discussed possibility of pain control with longer acting pain medication Patient desires to hold off that can wait till next procedure is done Patient was advised to give me a call after the procedure to no general condition and biopsy result Patient expressed understanding and was in agreement with this plan. She also understands that She can call clinic at any time with any questions, concerns, or complaints.   Upper endoscopy report from the [facility] dated 11th October has been  reviewed Another biopsy and endoscopy has been planned in 2 weeks since then. No matching staging information was found for the patient.  Forest Gleason, MD   09/20/2015 11:31 AM

## 2015-09-30 ENCOUNTER — Other Ambulatory Visit: Payer: Self-pay

## 2015-10-01 ENCOUNTER — Encounter: Payer: Self-pay | Admitting: Family Medicine

## 2015-10-01 ENCOUNTER — Ambulatory Visit (INDEPENDENT_AMBULATORY_CARE_PROVIDER_SITE_OTHER): Payer: Medicare Other | Admitting: Family Medicine

## 2015-10-01 VITALS — BP 120/60 | HR 80 | Ht 67.0 in | Wt 166.0 lb

## 2015-10-01 DIAGNOSIS — J301 Allergic rhinitis due to pollen: Secondary | ICD-10-CM | POA: Diagnosis not present

## 2015-10-01 DIAGNOSIS — H109 Unspecified conjunctivitis: Secondary | ICD-10-CM | POA: Diagnosis not present

## 2015-10-01 DIAGNOSIS — J0100 Acute maxillary sinusitis, unspecified: Secondary | ICD-10-CM

## 2015-10-01 MED ORDER — DOXYCYCLINE HYCLATE 100 MG PO TABS: 100.0000 mg | ORAL_TABLET | Freq: Two times a day (BID) | ORAL | Status: DC

## 2015-10-01 NOTE — Progress Notes (Signed)
Name: Kelly Krause   MRN: [id number]    DOB: [birth year]   Date:10/01/2015       Progress Note  Subjective  Chief Complaint  Chief Complaint  Patient presents with  . Sinusitis    cough and cong    Sinusitis This is a new problem. The current episode started more than 1 year ago. The problem has been waxing and waning since onset. There has been no fever. The pain is mild. Associated symptoms include congestion, a hoarse voice, sinus pressure, sneezing, a sore throat and swollen glands. Pertinent negatives include no chills, coughing, diaphoresis, ear pain, headaches, neck pain or shortness of breath. Past treatments include acetaminophen and oral decongestants. The treatment provided mild relief.  Conjunctivitis  The current episode started 2 days ago. The problem has been unchanged. Associated symptoms include congestion, sore throat, swollen glands, eye discharge and eye redness. Pertinent negatives include no fever, no decreased vision, no double vision, no eye itching, no abdominal pain, no constipation, no diarrhea, no nausea, no ear discharge, no ear pain, no headaches, no neck pain, no cough, no wheezing and no rash.    No problem-specific assessment & plan notes found for this encounter.   Past Medical History  Diagnosis Date  . Major depressive disorder, recurrent episode, moderate ([place])   . Persistent disorder of initiating or maintaining sleep     Past Surgical History  Procedure Laterality Date  . Sinusotomy    . Bunionectomy    . Lobectomy Right   . Partial hysterectomy    . Abdominal hysterectomy    . Esophgeal cancer    . Esophagogastroduodenoscopy N/A 04/21/2015    Procedure: ESOPHAGOGASTRODUODENOSCOPY (EGD);  Surgeon: Lollie Sails, MD;  Location: [facility];  Service: Endoscopy;  Laterality: N/A;    Family History  Problem Relation Age of Onset  . Stroke Mother   . Depression Mother   . Alcohol abuse Father   . Stroke Father   . Hypertension  Father     Social History   Social History  . Marital Status: Married    Spouse Name: N/A  . Number of Children: N/A  . Years of Education: N/A   Occupational History  . Not on file.   Social History Main Topics  . Smoking status: Former Research scientist (life sciences)  . Smokeless tobacco: Not on file  . Alcohol Use: Not on file  . Drug Use: Not on file  . Sexual Activity: Not Currently   Other Topics Concern  . Not on file   Social History Narrative    Allergies  Allergen Reactions  . Amoxicillin-Pot Clavulanate Itching and Nausea Only  . Codeine Nausea Only and Other (See Comments)    GI Upset headaches  . Demerol [Meperidine] Itching, Nausea Only and Other (See Comments)    GI Upset, Headaches   . Latex Hives and Itching  . Morphine And Related Itching, Nausea Only and Other (See Comments)    headaches  . Oxycodone-Acetaminophen Other (See Comments)  . Penicillins Diarrhea  . Strawberry Extract   . Tramadol Other (See Comments)     GI Upset  . Buprenorphine Hcl Itching and Nausea Only    Other reaction(s): Other (See Comments) headaches  . Tapentadol Rash     Review of Systems  Constitutional: Negative for fever, chills, weight loss, malaise/fatigue and diaphoresis.  HENT: Positive for congestion, hoarse voice, sinus pressure, sneezing and sore throat. Negative for ear discharge and ear pain.   Eyes: Positive for discharge and  redness. Negative for blurred vision, double vision and itching.  Respiratory: Negative for cough, sputum production, shortness of breath and wheezing.   Cardiovascular: Negative for chest pain, palpitations and leg swelling.  Gastrointestinal: Negative for heartburn, nausea, abdominal pain, diarrhea, constipation, blood in stool and melena.  Genitourinary: Negative for dysuria, urgency, frequency and hematuria.  Musculoskeletal: Negative for myalgias, back pain, joint pain and neck pain.  Skin: Negative for rash.  Neurological: Negative for dizziness,  tingling, sensory change, focal weakness and headaches.  Endo/Heme/Allergies: Negative for environmental allergies and polydipsia. Does not bruise/bleed easily.  Psychiatric/Behavioral: Negative for depression and suicidal ideas. The patient is not nervous/anxious and does not have insomnia.      Objective  Filed Vitals:   10/01/15 0824  BP: 120/60  Pulse: 80  Height: '5\' 7"'$  (1.702 m)  Weight: 166 lb (75.297 kg)    Physical Exam    Assessment & Plan  Problem List Items Addressed This Visit      Respiratory   Allergic rhinitis   Sinus infection - Primary   Relevant Medications   doxycycline (VIBRA-TABS) 100 MG tablet    Other Visit Diagnoses    Bilateral conjunctivitis        sodium sulamyd         Dr. Macon [facility]  10/01/2015

## 2015-10-07 ENCOUNTER — Inpatient Hospital Stay: Payer: Medicare Other | Attending: Oncology

## 2015-10-13 ENCOUNTER — Encounter: Payer: Self-pay | Admitting: Family Medicine

## 2015-10-13 ENCOUNTER — Ambulatory Visit (INDEPENDENT_AMBULATORY_CARE_PROVIDER_SITE_OTHER): Payer: Medicare Other | Admitting: Family Medicine

## 2015-10-13 VITALS — BP 112/60 | HR 64 | Ht 67.0 in | Wt 158.0 lb

## 2015-10-13 DIAGNOSIS — E784 Other hyperlipidemia: Secondary | ICD-10-CM

## 2015-10-13 DIAGNOSIS — C153 Malignant neoplasm of upper third of esophagus: Secondary | ICD-10-CM | POA: Diagnosis not present

## 2015-10-13 DIAGNOSIS — I499 Cardiac arrhythmia, unspecified: Secondary | ICD-10-CM

## 2015-10-13 DIAGNOSIS — I10 Essential (primary) hypertension: Secondary | ICD-10-CM

## 2015-10-13 DIAGNOSIS — F329 Major depressive disorder, single episode, unspecified: Secondary | ICD-10-CM | POA: Diagnosis not present

## 2015-10-13 DIAGNOSIS — J301 Allergic rhinitis due to pollen: Secondary | ICD-10-CM | POA: Diagnosis not present

## 2015-10-13 DIAGNOSIS — Z0000 Encounter for general adult medical examination without abnormal findings: Secondary | ICD-10-CM | POA: Diagnosis not present

## 2015-10-13 DIAGNOSIS — F32A Depression, unspecified: Secondary | ICD-10-CM

## 2015-10-13 DIAGNOSIS — E7849 Other hyperlipidemia: Secondary | ICD-10-CM

## 2015-10-13 MED ORDER — BUPROPION HCL ER (SR) 150 MG PO TB12: 150.0000 mg | ORAL_TABLET | Freq: Two times a day (BID) | ORAL | Status: DC

## 2015-10-13 MED ORDER — SIMVASTATIN 40 MG PO TABS: 40.0000 mg | ORAL_TABLET | Freq: Every day | ORAL | Status: DC

## 2015-10-13 MED ORDER — AMLODIPINE BESYLATE 10 MG PO TABS: ORAL_TABLET | ORAL | Status: DC

## 2015-10-13 MED ORDER — SOTALOL HCL 80 MG PO TABS: 80.0000 mg | ORAL_TABLET | Freq: Two times a day (BID) | ORAL | Status: DC

## 2015-10-13 MED ORDER — SERTRALINE HCL 25 MG PO TABS: 25.0000 mg | ORAL_TABLET | Freq: Every day | ORAL | Status: DC

## 2015-10-13 MED ORDER — HYDROCHLOROTHIAZIDE 25 MG PO TABS: 25.0000 mg | ORAL_TABLET | Freq: Every day | ORAL | Status: DC

## 2015-10-13 MED ORDER — FLUTICASONE PROPIONATE 50 MCG/ACT NA SUSP: 1.0000 | Freq: Every day | NASAL | Status: DC

## 2015-10-13 NOTE — Progress Notes (Signed)
Name: Kelly Krause   MRN: [id number]    DOB: 1941-07-27   Date:10/13/2015       Progress Note  Subjective  Chief Complaint  Chief Complaint  Patient presents with  . Hyperlipidemia  . Hypertension  . Depression  . Allergic Rhinitis     nasal spray- gets Zyrtec otc    Hyperlipidemia This is a recurrent problem. The current episode started more than 1 year ago. The problem is controlled. She has no history of chronic renal disease, diabetes, hypothyroidism, liver disease or obesity. Pertinent negatives include no chest pain, focal sensory loss, focal weakness, leg pain or shortness of breath. She is currently on no antihyperlipidemic treatment. The current treatment provides moderate improvement of lipids. There are no compliance problems.   Hypertension This is a chronic problem. The current episode started more than 1 year ago. The problem has been waxing and waning since onset. The problem is controlled. Pertinent negatives include no anxiety, blurred vision, chest pain, headaches, malaise/fatigue, neck pain, orthopnea, palpitations, peripheral edema, PND, shortness of breath or sweats. There are no associated agents to hypertension. Past treatments include ACE inhibitors and calcium channel blockers. The current treatment provides moderate improvement. Hypertensive end-organ damage includes renovascular disease. There is no history of angina, kidney disease, CAD/MI, CVA, heart failure, left ventricular hypertrophy, PVD or retinopathy. There is no history of chronic renal disease or a hypertension causing med.  Depression        This is a chronic problem.  The current episode started more than 1 year ago.   The problem occurs intermittently.  The problem has been waxing and waning since onset.  Associated symptoms include no decreased concentration, no fatigue, no helplessness, no hopelessness, does not have insomnia, not irritable, no restlessness, no decreased interest, no appetite change,  no body aches and no headaches.     Exacerbated by: personal illness.  Past treatments include SSRIs - Selective serotonin reuptake inhibitors.  Compliance with treatment is good.  Previous treatment provided mild relief.  Past medical history includes eating disorder.     Pertinent negatives include no hypothyroidism and no anxiety.   No problem-specific assessment & plan notes found for this encounter.   Past Medical History  Diagnosis Date  . Major depressive disorder, recurrent episode, moderate ([place])   . Persistent disorder of initiating or maintaining sleep   . GERD (gastroesophageal reflux disease)   . Hyperlipidemia   . Hypertension   . Allergy   . Cancer [facility])     Past Surgical History  Procedure Laterality Date  . Sinusotomy    . Bunionectomy    . Lobectomy Right   . Partial hysterectomy    . Abdominal hysterectomy    . Esophgeal cancer    . Esophagogastroduodenoscopy N/A 04/21/2015    Procedure: ESOPHAGOGASTRODUODENOSCOPY (EGD);  Surgeon: Lollie Sails, MD;  Location: [facility] ENDOSCOPY;  Service: Endoscopy;  Laterality: N/A;    Family History  Problem Relation Age of Onset  . Stroke Mother   . Depression Mother   . Alcohol abuse Father   . Stroke Father   . Hypertension Father     Social History   Social History  . Marital Status: Married    Spouse Name: N/A  . Number of Children: N/A  . Years of Education: N/A   Occupational History  . Not on file.   Social History Main Topics  . Smoking status: Former Research scientist (life sciences)  . Smokeless tobacco: Not on file  . Alcohol Use:  Not on file  . Drug Use: Not on file  . Sexual Activity: Not Currently   Other Topics Concern  . Not on file   Social History Narrative    Allergies  Allergen Reactions  . Amoxicillin-Pot Clavulanate Itching and Nausea Only  . Codeine Nausea Only and Other (See Comments)    GI Upset headaches  . Demerol [Meperidine] Itching, Nausea Only and Other (See Comments)    GI Upset,  Headaches   . Latex Hives and Itching  . Morphine And Related Itching, Nausea Only and Other (See Comments)    headaches  . Oxycodone-Acetaminophen Other (See Comments)  . Penicillins Diarrhea  . Strawberry Extract   . Tramadol Other (See Comments)     GI Upset  . Buprenorphine Hcl Itching and Nausea Only    Other reaction(s): Other (See Comments) headaches  . Tapentadol Rash     Review of Systems  Constitutional: Negative for malaise/fatigue, appetite change and fatigue.  Eyes: Negative for blurred vision.  Respiratory: Negative for shortness of breath.   Cardiovascular: Negative for chest pain, palpitations, orthopnea and PND.  Musculoskeletal: Negative for neck pain.  Neurological: Negative for focal weakness and headaches.  Psychiatric/Behavioral: Positive for depression. Negative for decreased concentration. The patient does not have insomnia.      Objective  Filed Vitals:   10/13/15 1005  BP: 112/60  Pulse: 64  Height: '5\' 7"'$  (1.702 m)  Weight: 158 lb (71.668 kg)    Physical Exam  Constitutional: She is well-developed, well-nourished, and in no distress. She is not irritable. No distress.  HENT:  Head: Normocephalic and atraumatic.  Right Ear: External ear normal.  Left Ear: External ear normal.  Nose: Nose normal.  Mouth/Throat: Oropharynx is clear and moist.  Eyes: Conjunctivae and EOM are normal. Pupils are equal, round, and reactive to light. Right eye exhibits no discharge. Left eye exhibits no discharge.  Neck: Normal range of motion. Neck supple. No JVD present. No thyromegaly present.  Cardiovascular: Normal rate, regular rhythm, normal heart sounds and intact distal pulses.  Exam reveals no gallop and no friction rub.   No murmur heard. Pulmonary/Chest: Effort normal and breath sounds normal. No respiratory distress. She has no wheezes. She has no rales.  Abdominal: Soft. Bowel sounds are normal. She exhibits no mass. There is no tenderness. There is  no guarding.  Musculoskeletal: Normal range of motion. She exhibits no edema or tenderness.  Lymphadenopathy:    She has no cervical adenopathy.  Neurological: She is alert.  Skin: Skin is warm and dry. She is not diaphoretic.  Psychiatric: Mood and affect normal.      Assessment & Plan  Problem List Items Addressed This Visit      Cardiovascular and Mediastinum   Essential (primary) hypertension - Primary   Relevant Medications   amLODipine (NORVASC) 10 MG tablet   hydrochlorothiazide (HYDRODIURIL) 25 MG tablet   simvastatin (ZOCOR) 40 MG tablet   sotalol (BETAPACE) 80 MG tablet   Other Relevant Orders   Renal Function Panel   Arrhythmia, ventricular   Relevant Medications   amLODipine (NORVASC) 10 MG tablet   hydrochlorothiazide (HYDRODIURIL) 25 MG tablet   simvastatin (ZOCOR) 40 MG tablet   sotalol (BETAPACE) 80 MG tablet   BP (high blood pressure)   Relevant Medications   amLODipine (NORVASC) 10 MG tablet   hydrochlorothiazide (HYDRODIURIL) 25 MG tablet   simvastatin (ZOCOR) 40 MG tablet   sotalol (BETAPACE) 80 MG tablet     Respiratory  Allergic rhinitis   Relevant Medications   fluticasone (FLONASE) 50 MCG/ACT nasal spray     Digestive   Cancer of esophagus (HCC)     Other   Familial multiple lipoprotein-type hyperlipidemia   Relevant Medications   amLODipine (NORVASC) 10 MG tablet   hydrochlorothiazide (HYDRODIURIL) 25 MG tablet   simvastatin (ZOCOR) 40 MG tablet   sotalol (BETAPACE) 80 MG tablet   Other Relevant Orders   Lipid Profile   Routine general medical examination at a health care facility    Other Visit Diagnoses    Depression        Relevant Medications    buPROPion (WELLBUTRIN SR) 150 MG 12 hr tablet    hydrochlorothiazide (HYDRODIURIL) 25 MG tablet    sertraline (ZOLOFT) 25 MG tablet         Dr. Deanna Jones [facility]  10/13/2015

## 2015-10-14 LAB — LIPID PANEL
Chol/HDL Ratio: 3 ratio units (ref 0.0–4.4)
Cholesterol, Total: 199 mg/dL (ref 100–199)
HDL: 67 mg/dL (ref >39)
LDL CALC: 112 mg/dL — AB (ref 0–99)
Triglycerides: 98 mg/dL (ref 0–149)
VLDL CHOLESTEROL CAL: 20 mg/dL (ref 5–40)

## 2015-10-14 LAB — RENAL FUNCTION PANEL
ALBUMIN: 4.2 g/dL (ref 3.5–4.8)
BUN/Creatinine Ratio: 17 (ref 11–26)
BUN: 15 mg/dL (ref 8–27)
CALCIUM: 9.2 mg/dL (ref 8.7–10.3)
CHLORIDE: 96 mmol/L — AB (ref 97–106)
CO2: 30 mmol/L — AB (ref 18–29)
Creatinine, Ser: 0.86 mg/dL (ref 0.57–1.00)
GFR calc Af Amer: 76 mL/min/{1.73_m2} (ref >59)
GFR calc non Af Amer: 66 mL/min/{1.73_m2} (ref >59)
GLUCOSE: 83 mg/dL (ref 65–99)
PHOSPHORUS: 3.9 mg/dL (ref 2.5–4.5)
POTASSIUM: 3.9 mmol/L (ref 3.5–5.2)
SODIUM: 142 mmol/L (ref 136–144)

## 2015-10-26 ENCOUNTER — Other Ambulatory Visit: Payer: Self-pay

## 2015-10-26 ENCOUNTER — Ambulatory Visit: Payer: Self-pay | Admitting: Oncology

## 2015-11-04 ENCOUNTER — Inpatient Hospital Stay: Payer: Medicare Other | Attending: Oncology

## 2015-11-04 ENCOUNTER — Encounter: Payer: Self-pay | Admitting: Oncology

## 2015-11-04 ENCOUNTER — Inpatient Hospital Stay (HOSPITAL_BASED_OUTPATIENT_CLINIC_OR_DEPARTMENT_OTHER): Payer: Medicare Other | Admitting: Oncology

## 2015-11-04 ENCOUNTER — Inpatient Hospital Stay: Payer: Medicare Other

## 2015-11-04 VITALS — BP 136/78 | HR 69 | Temp 95.9°F | Resp 18 | Wt 157.0 lb

## 2015-11-04 DIAGNOSIS — Z79899 Other long term (current) drug therapy: Secondary | ICD-10-CM

## 2015-11-04 DIAGNOSIS — R1310 Dysphagia, unspecified: Secondary | ICD-10-CM | POA: Insufficient documentation

## 2015-11-04 DIAGNOSIS — Z9221 Personal history of antineoplastic chemotherapy: Secondary | ICD-10-CM | POA: Diagnosis not present

## 2015-11-04 DIAGNOSIS — K2100 Gastro-esophageal reflux disease with esophagitis, without bleeding: Secondary | ICD-10-CM

## 2015-11-04 DIAGNOSIS — Z87891 Personal history of nicotine dependence: Secondary | ICD-10-CM

## 2015-11-04 DIAGNOSIS — Z923 Personal history of irradiation: Secondary | ICD-10-CM

## 2015-11-04 DIAGNOSIS — R5383 Other fatigue: Secondary | ICD-10-CM | POA: Diagnosis not present

## 2015-11-04 DIAGNOSIS — C159 Malignant neoplasm of esophagus, unspecified: Secondary | ICD-10-CM

## 2015-11-04 DIAGNOSIS — R531 Weakness: Secondary | ICD-10-CM | POA: Diagnosis not present

## 2015-11-04 DIAGNOSIS — F329 Major depressive disorder, single episode, unspecified: Secondary | ICD-10-CM | POA: Insufficient documentation

## 2015-11-04 DIAGNOSIS — Z8501 Personal history of malignant neoplasm of esophagus: Secondary | ICD-10-CM

## 2015-11-04 DIAGNOSIS — G4700 Insomnia, unspecified: Secondary | ICD-10-CM | POA: Insufficient documentation

## 2015-11-04 DIAGNOSIS — E785 Hyperlipidemia, unspecified: Secondary | ICD-10-CM | POA: Insufficient documentation

## 2015-11-04 DIAGNOSIS — K219 Gastro-esophageal reflux disease without esophagitis: Secondary | ICD-10-CM

## 2015-11-04 DIAGNOSIS — I10 Essential (primary) hypertension: Secondary | ICD-10-CM | POA: Insufficient documentation

## 2015-11-04 LAB — COMPREHENSIVE METABOLIC PANEL
ALT: 16 U/L (ref 14–54)
AST: 21 U/L (ref 15–41)
Albumin: 4.1 g/dL (ref 3.5–5.0)
Alkaline Phosphatase: 72 U/L (ref 38–126)
Anion gap: 8 (ref 5–15)
BILIRUBIN TOTAL: 0.6 mg/dL (ref 0.3–1.2)
BUN: 26 mg/dL — AB (ref 6–20)
CHLORIDE: 99 mmol/L — AB (ref 101–111)
CO2: 29 mmol/L (ref 22–32)
CREATININE: 0.88 mg/dL (ref 0.44–1.00)
Calcium: 9.2 mg/dL (ref 8.9–10.3)
GFR calc Af Amer: >60 mL/min (ref >60)
Glucose, Bld: 107 mg/dL — ABNORMAL HIGH (ref 65–99)
Potassium: 3.5 mmol/L (ref 3.5–5.1)
Sodium: 136 mmol/L (ref 135–145)
TOTAL PROTEIN: 7.3 g/dL (ref 6.5–8.1)

## 2015-11-04 LAB — CBC WITH DIFFERENTIAL/PLATELET
BASOS ABS: 0 10*3/uL (ref 0–0.1)
Basophils Relative: 0 %
Eosinophils Absolute: 0.2 10*3/uL (ref 0–0.7)
Eosinophils Relative: 3 %
HEMATOCRIT: 39.4 % (ref 35.0–47.0)
Hemoglobin: 13.1 g/dL (ref 12.0–16.0)
LYMPHS PCT: 30 %
Lymphs Abs: 2 10*3/uL (ref 1.0–3.6)
MCH: 30.5 pg (ref 26.0–34.0)
MCHC: 33.3 g/dL (ref 32.0–36.0)
MCV: 91.8 fL (ref 80.0–100.0)
Monocytes Absolute: 0.6 10*3/uL (ref 0.2–0.9)
Monocytes Relative: 9 %
NEUTROS ABS: 4 10*3/uL (ref 1.4–6.5)
Neutrophils Relative %: 58 %
Platelets: 264 10*3/uL (ref 150–440)
RBC: 4.29 MIL/uL (ref 3.80–5.20)
RDW: 13.2 % (ref 11.5–14.5)
WBC: 6.8 10*3/uL (ref 3.6–11.0)

## 2015-11-04 MED ORDER — SUCRALFATE 1 GM/10ML PO SUSP: 1.0000 g | Freq: Three times a day (TID) | ORAL | Status: DC

## 2015-11-04 MED ORDER — DEXLANSOPRAZOLE 60 MG PO CPDR: 60.0000 mg | DELAYED_RELEASE_CAPSULE | Freq: Every day | ORAL | Status: DC

## 2015-11-04 MED ORDER — HEPARIN SOD (PORK) LOCK FLUSH 100 UNIT/ML IV SOLN
500.0000 [IU] | Freq: Once | INTRAVENOUS | Status: AC
  Administered 2015-11-04: 500 [IU] via INTRAVENOUS
  Filled 2015-11-04: qty 5

## 2015-11-04 MED ORDER — SODIUM CHLORIDE 0.9 % IJ SOLN
10.0000 mL | Freq: Once | INTRAMUSCULAR | Status: AC
  Administered 2015-11-04: 10 mL via INTRAVENOUS
  Filled 2015-11-04: qty 10

## 2015-11-04 NOTE — Progress Notes (Signed)
Patient is going to [place] for 4 months and would like refill for Carafate Liquid.

## 2015-11-04 NOTE — Progress Notes (Signed)
Kelly Krause @ [facility] Telephone:(336) [phone]  Fax:(336) [phone]     Kelly Krause OB: March 14, 1941  MR#: [id number]  XHF#:[id number]  Patient Care Team: Juline Patch, MD as PCP - General (Family Medicine)  CHIEF COMPLAINT:  Chief Complaint  Patient presents with  . 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 PET scan was positive for hypermetabolic area in esophagus  T1bN32m  stage IB tumor.  Started on radiation and chemotherapy(may of 2015) Possibility of surgical evaluation had been discssed and patient has declined patient has finished radiation and chemotherapy in July of 2015 2.  July 2 016 Upper endoscopy reveals high-grade dysplastic changes in esophagus at 23 cm  And mild dysplastic changes at 35 and 30 cm in esophagus area. No evidence of invasive malignancy     Cancer of esophagus ([place]   06/25/2014 Initial Diagnosis Cancer of esophagus    On 74year old lady with a previous history of carcinoma of esophagus, squamous cell type patient underwent radiation chemotherapy. Patient continues to have problems with dysphagia INTERVAL HISTORY:  Recently patient had upper endoscopy done which revealed multiple lady of dysplastic changes. I patient underwent esophageal dilated Patient 4.  And radiofrequency ablation.  I do not have all the reports available but according to her sick continues to have acid reflux.  Carafate is helping patient somewhat however problem with acid reflux continues. Patient was advised surgical intervention [place]  REVIEW OF SYSTEMS:    general status: Patient is feeling weak and tired.  No change in a performance status.  No chills.  No fever. HEENT: .  No evidence of stomatitis Patient had increasing difficulty swallowing after last radiofrequency ablation procedure.   Requiring IV fluid. Lungs: No cough or shortness of breath Cardiac: No chest pain or paroxysmal nocturnal dyspnea GI: No nausea no vomiting no diarrhea no abdominal pain Dysphagia.  Multiple lady of dysplastic changes in the esophagus and recent upper endoscopy.  Negative PET scan Skin: No rash Lower extremity no swelling Neurological system: No tingling.  No numbness.  No other focal signs Musculoskeletal system no bony pains  As per HPI. Otherwise, a complete review of systems is negatve.  PAST MEDICAL HISTORY: Past Medical History  Diagnosis Date  . Major depressive disorder, recurrent episode, moderate ([place]   . Persistent disorder of initiating or maintaining sleep   . GERD (gastroesophageal reflux disease)   . Hyperlipidemia   . Hypertension   . Allergy   . Cancer ([facility]     PAST SURGICAL HISTORY: Past Surgical History  Procedure Laterality Date  . Sinusotomy    . Bunionectomy    . Lobectomy Right   . Partial hysterectomy    . Abdominal hysterectomy    . Esophgeal cancer    . Esophagogastroduodenoscopy N/A 04/21/2015    Procedure: ESOPHAGOGASTRODUODENOSCOPY (EGD);  Surgeon: MLollie Sails MD;  [facility];  Service: Endoscopy;  Laterality: N/A;    FAMILY HISTORY Family History  Problem Relation Age of Onset  . Stroke Mother   . Depression Mother   . Alcohol abuse Father   . Stroke Father   . Hypertension Father     ADVANCED DIRECTIVES:  No flowsheet data found.  HEALTH MAINTENANCE: Social History  Substance Use Topics  . Smoking status: Former SResearch scientist (life sciences) . Smokeless tobacco: None  . Alcohol  Use: None      Allergies  Allergen Reactions  . Amoxicillin-Pot Clavulanate Itching and Nausea Only  . Codeine Nausea Only and Other (See Comments)    GI Upset headaches  . Demerol [Meperidine] Itching, Nausea Only and Other (See Comments)    GI Upset, Headaches   . Latex Hives and Itching  . Morphine And Related Itching, Nausea Only and Other  (See Comments)    headaches  . Oxycodone-Acetaminophen Other (See Comments)  . Penicillins Diarrhea  . Strawberry Extract   . Tramadol Other (See Comments)     GI Upset  . Buprenorphine Hcl Itching and Nausea Only    Other reaction(s): Other (See Comments) headaches  . Fentanyl Itching and Nausea And Vomiting  . Tapentadol Rash    Current Outpatient Prescriptions  Medication Sig Dispense Refill  . amLODipine (NORVASC) 10 MG tablet TAKE ONE TABLET BY MOUTH ONCE DAILY-NEED  APPOINTMENT 90 tablet 2  . Biotin 300 MCG TABS Take 1 tablet by mouth daily.    Marland Kitchen buPROPion (WELLBUTRIN SR) 150 MG 12 hr tablet Take 1 tablet (150 mg total) by mouth 2 (two) times daily. 180 tablet 2  . cetirizine (ZYRTEC) 10 MG tablet Take 1 tablet by mouth 2 (two) times daily. otc    . Cholecalciferol (VITAMIN D-3) 1000 UNITS CAPS Take 1 capsule by mouth daily.    Marland Kitchen docusate sodium (COLACE) 100 MG capsule Take 200 mg by mouth daily.    . fluticasone (FLONASE) 50 MCG/ACT nasal spray Place 1 spray into both nostrils daily. 1 g 11  . hydrochlorothiazide (HYDRODIURIL) 25 MG tablet Take 1 tablet (25 mg total) by mouth daily. 90 tablet 2  . hyoscyamine (LEVSIN, ANASPAZ) 0.125 MG tablet Take 0.125 mg by mouth 3 (three) times daily. Dr Oliva Bustard    . pantoprazole (PROTONIX) 40 MG tablet Take 40 mg by mouth 3 (three) times daily. Dr Gustavo Lah    . ramipril (ALTACE) 10 MG capsule Take 1 capsule (10 mg total) by mouth daily. 90 capsule 2  . sertraline (ZOLOFT) 25 MG tablet Take 1 tablet (25 mg total) by mouth daily. 90 tablet 2  . simvastatin (ZOCOR) 40 MG tablet Take 1 tablet (40 mg total) by mouth daily. 90 tablet 2  . sotalol (BETAPACE) 80 MG tablet Take 1 tablet (80 mg total) by mouth 2 (two) times daily. 180 tablet 2  . sucralfate (CARAFATE) 1 GM/10ML suspension Take by mouth.    . dexlansoprazole (DEXILANT) 60 MG capsule Take 1 capsule (60 mg total) by mouth daily. 30 capsule 3  . sucralfate (CARAFATE) 1 GM/10ML suspension  Take 10 mLs (1 g total) by mouth 4 (four) times daily -  with meals and at bedtime. 420 mL 3   No current facility-administered medications for this visit.   Facility-Administered Medications Ordered in Other Visits  Medication Dose Route Frequency Provider Last Rate Last Dose  . 0.9 %  sodium chloride infusion   Intravenous Continuous Evlyn Kanner, NP 999 mL/hr at 07/20/15 1146    . heparin lock flush 100 unit/mL  500 Units Intravenous Once Forest Gleason, MD      . sodium chloride 0.9 % injection 10 mL  10 mL Intravenous PRN Forest Gleason, MD   10 mL at 07/20/15 1147    OBJECTIVE:  Filed Vitals:   11/04/15 1454  BP: 136/78  Pulse: 69  Temp: 95.9 F (35.5 C)  Resp: 18     Body mass index is 24.58 kg/(m^2).  ECOG FS:1 - Symptomatic but completely ambulatory  PHYSICAL EXAM: GENERAL:  Well developed, well nourished, sitting comfortably in the exam room in no acute distress. MENTAL STATUS:  Alert and oriented to person, place and time. HEAD:  Normocephalic, atraumatic, face symmetric, no Cushingoid features.   RESPIRATORY:  Clear to auscultation without rales, wheezes or rhonchi. CARDIOVASCULAR:  Regular rate and rhythm without murmur, rub or gallop.  ABDOMEN:  Soft, non-tender, with active bowel sounds, and no hepatosplenomegaly.  No masses. BACK:  No CVA tenderness.  No tenderness on percussion of the back or rib cage. SKIN:  No rashes, ulcers or lesions. EXTREMITIES: No edema, no skin discoloration or tenderness.  No palpable cords. LYMPH NODES: No palpable cervical, supraclavicular, axillary or inguinal adenopathy  NEUROLOGICAL: Unremarkable. PSYCH:  Appropriate.   LAB RESULTS:  Infusion on 11/04/2015  Component Date Value Ref Range Status  . WBC 11/04/2015 6.8  3.6 - 11.0 K/uL Final  . RBC 11/04/2015 4.29  3.80 - 5.20 MIL/uL Final  . Hemoglobin 11/04/2015 13.1  12.0 - 16.0 g/dL Final  . HCT 11/04/2015 39.4  35.0 - 47.0 % Final  . MCV 11/04/2015 91.8  80.0 - 100.0  fL Final  . MCH 11/04/2015 30.5  26.0 - 34.0 pg Final  . MCHC 11/04/2015 33.3  32.0 - 36.0 g/dL Final  . RDW 11/04/2015 13.2  11.5 - 14.5 % Final  . Platelets 11/04/2015 264  150 - 440 K/uL Final  . Neutrophils Relative % 11/04/2015 58   Final  . Neutro Abs 11/04/2015 4.0  1.4 - 6.5 K/uL Final  . Lymphocytes Relative 11/04/2015 30   Final  . Lymphs Abs 11/04/2015 2.0  1.0 - 3.6 K/uL Final  . Monocytes Relative 11/04/2015 9   Final  . Monocytes Absolute 11/04/2015 0.6  0.2 - 0.9 K/uL Final  . Eosinophils Relative 11/04/2015 3   Final  . Eosinophils Absolute 11/04/2015 0.2  0 - 0.7 K/uL Final  . Basophils Relative 11/04/2015 0   Final  . Basophils Absolute 11/04/2015 0.0  0 - 0.1 K/uL Final  . Sodium 11/04/2015 136  135 - 145 mmol/L Final  . Potassium 11/04/2015 3.5  3.5 - 5.1 mmol/L Final  . Chloride 11/04/2015 99* 101 - 111 mmol/L Final  . CO2 11/04/2015 29  22 - 32 mmol/L Final  . Glucose, Bld 11/04/2015 107* 65 - 99 mg/dL Final  . BUN 11/04/2015 26* 6 - 20 mg/dL Final  . Creatinine, Ser 11/04/2015 0.88  0.44 - 1.00 mg/dL Final  . Calcium 11/04/2015 9.2  8.9 - 10.3 mg/dL Final  . Total Protein 11/04/2015 7.3  6.5 - 8.1 g/dL Final  . Albumin 11/04/2015 4.1  3.5 - 5.0 g/dL Final  . AST 11/04/2015 21  15 - 41 U/L Final  . ALT 11/04/2015 16  14 - 54 U/L Final  . Alkaline Phosphatase 11/04/2015 72  38 - 126 U/L Final  . Total Bilirubin 11/04/2015 0.6  0.3 - 1.2 mg/dL Final  . GFR calc non Af Amer 11/04/2015 >60  >60 mL/min Final  . GFR calc Af Amer 11/04/2015 >60  >60 mL/min Final   Comment: (NOTE) The eGFR has been calculated using the CKD EPI equation. This calculation has not been validated in all clinical situations. eGFR's persistently <60 mL/min signify possible Chronic Kidney Disease.   . Anion gap 11/04/2015 8  5 - 15 Final     Surgical Pathology  CASE: [id number]  PATIENT: Moshe Cipro  Surgical Pathology Report  SPECIMEN SUBMITTED:  A. Stomach, antrum  and body, cbx  B. GEJ at 38 cm, cbx  C. Stomach polyp, greater curvature, cbx  D. Esophagus at 35 cm, cbx  E. Esophagus at 29 to 30 cm, cbx  F. Esophagus at 23 cm, white plaque material   CLINICAL HISTORY:  None provided   PRE-OPERATIVE DIAGNOSIS:  Dysphagia   POST-OPERATIVE DIAGNOSIS:  None provided      DIAGNOSIS:  A. STOMACH, ANTRUM AND BODY; COLD BIOPSY:  - MILD CHRONIC INACTIVE GASTRITIS.  - NO ATROPHY, INTESTINAL METAPLASIA, DYSPLASIA, OR MALIGNANCY.  - NO HELICOBACTER PYLORI SEEN IN HEMATOXYLIN AND EOSIN SECTIONS.   B. GASTROESOPHAGEAL JUNCTION AT 38 CM; COLD BIOPSY:  - SQUAMOCOLUMNAR MUCOSA WITH MILD CHRONIC ACTIVE INFLAMMATION.  - NEGATIVE FOR GOBLET CELLS, DYSPLASIA, AND MALIGNANCY.   C. STOMACH POLYP, GREATER CURVATURE; COLD BIOPSY:  - FUNDIC GLAND POLYP.  - NEGATIVE FOR DYSPLASIA AND MALIGNANCY.   D. ESOPHAGUS AT 35 CM; COLDBIOPSY:  - STRATIFIED SQUAMOUS EPITHELIUM WITH MILD BASAL ATYPIA SUSPICIOUS FOR  LOW-GRADE DYSPLASIA.   E. ESOPHAGUS AT 29-30 CM; COLD BIOPSY:  - HIGH-GRADE SQUAMOUS DYSPLASIA IN 3 OF 4 SAMPLES.   F. ESOPHAGUS AT 23 CM, WHITE PLAQUE; COLD BIOPSY:  - HIGH-GRADE SQUAMOUS DYSPLASIA, KERATINIZING.             ASSESSMENT: Carcinoma of esophagus squamous cell carcinoma stage TIb N0 M0 tumor status post chemoradiation therapy Status post multiple dilated Patient of esophagus and radiofrequency ablation for dysphagia  Continues to have problem with gastroesophageal reflux disease  MEDICAL DECISION MAKING:  At this point in time patient will be started on Dexilant (if insurance approves) There is no evidence of recurrent disease. All lab data has been reviewed We will reevaluate patient in 4 months As patient is traveling to [place] mammogram can be scheduled there or after 6 comes back \Patient has lost 2 pounds of weight If patient continues to lose weight then dietitian consult would be ordered  Forest Gleason, MD   11/04/2015  3:35 PM

## 2015-11-05 ENCOUNTER — Ambulatory Visit: Payer: Self-pay | Admitting: Oncology

## 2015-11-05 ENCOUNTER — Other Ambulatory Visit: Payer: Self-pay

## 2015-11-05 ENCOUNTER — Telehealth: Payer: Self-pay | Admitting: *Deleted

## 2015-11-05 NOTE — Telephone Encounter (Signed)
Patient seen in clinic yesterday and Dexilant prescription was given.  Patient states it is too expensive and wants to know if MD will call something less expensive.

## 2015-11-05 NOTE — Telephone Encounter (Signed)
Called patient and left message that there is no option available to patient unless she continues carafate and protonix or picks up the [place] that was prescribed yesterday.

## 2015-11-05 NOTE — Telephone Encounter (Signed)
There is no other medication available for patient. Needs to continue protonix and carafate unless wants to buy dexilant per MD.

## 2015-11-18 ENCOUNTER — Inpatient Hospital Stay: Payer: Medicare Other

## 2015-12-09 ENCOUNTER — Telehealth: Payer: Self-pay | Admitting: *Deleted

## 2015-12-09 NOTE — Telephone Encounter (Signed)
[employer] and spoke with Coriann. Carafate has been approved until 11-20-16.  Called patient and left message to let her know medication has been approved through 11-20-16.

## 2015-12-09 NOTE — Telephone Encounter (Signed)
Here is the letter we discussed earlier.   Sent from Mail for [place] [facility]

## 2015-12-09 NOTE — Telephone Encounter (Signed)
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 Needs done ASAP, she is almost out of med

## 2015-12-10 NOTE — Telephone Encounter (Signed)
Entered in error

## 2015-12-11 ENCOUNTER — Other Ambulatory Visit: Payer: Self-pay | Admitting: Family Medicine

## 2015-12-20 DIAGNOSIS — E860 Dehydration: Secondary | ICD-10-CM | POA: Diagnosis not present

## 2015-12-20 DIAGNOSIS — I10 Essential (primary) hypertension: Secondary | ICD-10-CM | POA: Diagnosis not present

## 2015-12-22 DIAGNOSIS — K209 Esophagitis, unspecified: Secondary | ICD-10-CM | POA: Diagnosis not present

## 2015-12-22 DIAGNOSIS — Z8501 Personal history of malignant neoplasm of esophagus: Secondary | ICD-10-CM | POA: Diagnosis not present

## 2015-12-22 DIAGNOSIS — K222 Esophageal obstruction: Secondary | ICD-10-CM | POA: Diagnosis not present

## 2016-01-19 DIAGNOSIS — K222 Esophageal obstruction: Secondary | ICD-10-CM | POA: Diagnosis not present

## 2016-02-18 ENCOUNTER — Other Ambulatory Visit: Payer: Self-pay | Admitting: Family Medicine

## 2016-03-16 ENCOUNTER — Inpatient Hospital Stay: Payer: Medicare HMO | Attending: Oncology

## 2016-03-16 ENCOUNTER — Encounter: Payer: Self-pay | Admitting: Oncology

## 2016-03-16 ENCOUNTER — Inpatient Hospital Stay (HOSPITAL_BASED_OUTPATIENT_CLINIC_OR_DEPARTMENT_OTHER): Payer: Medicare HMO | Admitting: Oncology

## 2016-03-16 VITALS — BP 149/76 | HR 54 | Temp 96.4°F | Resp 18 | Wt 164.0 lb

## 2016-03-16 DIAGNOSIS — I10 Essential (primary) hypertension: Secondary | ICD-10-CM

## 2016-03-16 DIAGNOSIS — Z8501 Personal history of malignant neoplasm of esophagus: Secondary | ICD-10-CM | POA: Insufficient documentation

## 2016-03-16 DIAGNOSIS — E785 Hyperlipidemia, unspecified: Secondary | ICD-10-CM | POA: Diagnosis not present

## 2016-03-16 DIAGNOSIS — K219 Gastro-esophageal reflux disease without esophagitis: Secondary | ICD-10-CM | POA: Insufficient documentation

## 2016-03-16 DIAGNOSIS — Z85038 Personal history of other malignant neoplasm of large intestine: Secondary | ICD-10-CM | POA: Insufficient documentation

## 2016-03-16 DIAGNOSIS — R1310 Dysphagia, unspecified: Secondary | ICD-10-CM | POA: Insufficient documentation

## 2016-03-16 DIAGNOSIS — F329 Major depressive disorder, single episode, unspecified: Secondary | ICD-10-CM | POA: Insufficient documentation

## 2016-03-16 DIAGNOSIS — G4700 Insomnia, unspecified: Secondary | ICD-10-CM | POA: Diagnosis not present

## 2016-03-16 DIAGNOSIS — Z923 Personal history of irradiation: Secondary | ICD-10-CM | POA: Insufficient documentation

## 2016-03-16 DIAGNOSIS — K2100 Gastro-esophageal reflux disease with esophagitis, without bleeding: Secondary | ICD-10-CM

## 2016-03-16 DIAGNOSIS — Z87891 Personal history of nicotine dependence: Secondary | ICD-10-CM

## 2016-03-16 DIAGNOSIS — Z9221 Personal history of antineoplastic chemotherapy: Secondary | ICD-10-CM | POA: Diagnosis not present

## 2016-03-16 DIAGNOSIS — Z79899 Other long term (current) drug therapy: Secondary | ICD-10-CM | POA: Insufficient documentation

## 2016-03-16 DIAGNOSIS — R69 Illness, unspecified: Secondary | ICD-10-CM | POA: Diagnosis not present

## 2016-03-16 DIAGNOSIS — C155 Malignant neoplasm of lower third of esophagus: Secondary | ICD-10-CM

## 2016-03-16 LAB — CBC WITH DIFFERENTIAL/PLATELET
BASOS ABS: 0.1 10*3/uL (ref 0–0.1)
Basophils Relative: 1 %
EOS ABS: 0.4 10*3/uL (ref 0–0.7)
Eosinophils Relative: 6 %
HCT: 35.7 % (ref 35.0–47.0)
HEMOGLOBIN: 12.1 g/dL (ref 12.0–16.0)
LYMPHS ABS: 2.4 10*3/uL (ref 1.0–3.6)
LYMPHS PCT: 39 %
MCH: 31.1 pg (ref 26.0–34.0)
MCHC: 33.9 g/dL (ref 32.0–36.0)
MCV: 91.8 fL (ref 80.0–100.0)
Monocytes Absolute: 0.5 10*3/uL (ref 0.2–0.9)
Monocytes Relative: 9 %
NEUTROS PCT: 45 %
Neutro Abs: 2.8 10*3/uL (ref 1.4–6.5)
Platelets: 254 10*3/uL (ref 150–440)
RBC: 3.89 MIL/uL (ref 3.80–5.20)
RDW: 13.9 % (ref 11.5–14.5)
WBC: 6.1 10*3/uL (ref 3.6–11.0)

## 2016-03-16 LAB — COMPREHENSIVE METABOLIC PANEL
ALK PHOS: 63 U/L (ref 38–126)
ALT: 15 U/L (ref 14–54)
AST: 21 U/L (ref 15–41)
Albumin: 4.1 g/dL (ref 3.5–5.0)
Anion gap: 9 (ref 5–15)
BUN: 19 mg/dL (ref 6–20)
CALCIUM: 9.1 mg/dL (ref 8.9–10.3)
CO2: 28 mmol/L (ref 22–32)
CREATININE: 1.03 mg/dL — AB (ref 0.44–1.00)
Chloride: 102 mmol/L (ref 101–111)
GFR calc non Af Amer: 52 mL/min — ABNORMAL LOW (ref >60)
Glucose, Bld: 100 mg/dL — ABNORMAL HIGH (ref 65–99)
Potassium: 4 mmol/L (ref 3.5–5.1)
SODIUM: 139 mmol/L (ref 135–145)
Total Bilirubin: 0.6 mg/dL (ref 0.3–1.2)
Total Protein: 6.9 g/dL (ref 6.5–8.1)

## 2016-03-17 ENCOUNTER — Encounter: Payer: Self-pay | Admitting: Oncology

## 2016-03-17 NOTE — Progress Notes (Signed)
[name] @ [facility] [phone]  [phone]     Kelly Krause OB: [birth year]  MR#: [id number]  BPZ#:[id number]  Patient Care Team: Juline Patch, MD as PCP - General (Family Medicine)  CHIEF COMPLAINT:  Chief Complaint  Patient presents with  . 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 PET scan was positive for hypermetabolic area in esophagus  [id number]  stage IB tumor.  Started on radiation and chemotherapy(may of 2015) Possibility of surgical evaluation had been discssed and patient has declined patient has finished radiation and chemotherapy in July of 2015 2.  July 2 016 Upper endoscopy reveals high-grade dysplastic changes in esophagus at 23 cm  And mild dysplastic changes at 35 and 30 cm in esophagus area. No evidence of invasive malignancy     Cancer of esophagus ([place]   06/25/2014 Initial Diagnosis Cancer of esophagus    On 75year old lady with a previous history of carcinoma of esophagus, squamous cell type patient underwent radiation chemotherapy. Patient continues to have problems with dysphagia INTERVAL HISTORY:  Recently patient had upper endoscopy done which revealed multiple lady of dysplastic changes. I Patient is here for further follow-up regarding esophageal cancer.  According to her swallowing is improved.  Patient [place]  [place]etite is improved.  Has gained weight. The [facility] and biopsy. Getting regular mammograms done.  Appetite has improved.  Here for further follow-up patient is also due for next PET scan for restaging because of persistent issues related to esophageal cancer  REVIEW OF SYSTEMS:    general status: Feeling  somewhat stronger.  Swallowing is improved.  Appetite is improving.  Has gained weight.  HEENT: .  No evidence of stomatitis Patient had increasing difficulty swallowing after last radiofrequency ablation procedure.  Requiring IV fluid. Lungs: No cough or shortness of breath Cardiac: No chest pain or paroxysmal nocturnal dyspnea GI: No nausea no vomiting no diarrhea no abdominal pain Dysphagia has improved.  Patient had esophageal   Dilatation  2 Skin: No rash Lower extremity no swelling Neurological system: No tingling.  No numbness.  No other focal signs Musculoskeletal system no bony pains  As per HPI. Otherwise, a complete review of systems is negatve.  PAST MEDICAL HISTORY: Past Medical History  Diagnosis Date  . Major depressive disorder, recurrent episode, moderate ([place]   . Persistent disorder of initiating or maintaining sleep   . GERD (gastroesophageal reflux disease)   . Hyperlipidemia   . Hypertension   . Allergy   . Cancer ([facility]     PAST SURGICAL HISTORY: Past Surgical History  Procedure Laterality Date  . Sinusotomy    . Bunionectomy    . Lobectomy Right   . Partial hysterectomy    . Abdominal hysterectomy    . Esophgeal cancer    . Esophagogastroduodenoscopy N/A 04/21/2015    Procedure: ESOPHAGOGASTRODUODENOSCOPY (EGD);  Surgeon: MLollie Sails MD;  [facility];  Service: Endoscopy;  Laterality: N/A;    FAMILY HISTORY Family History  Problem Relation Age of Onset  . Stroke Mother   . Depression Mother   . Alcohol abuse Father   . Stroke Father   . Hypertension Father     ADVANCED DIRECTIVES:  No flowsheet data found.  HEALTH MAINTENANCE:  Social History  Substance Use Topics  . Smoking status: Former Research scientist (life sciences)  . Smokeless tobacco: None  . Alcohol Use: None      Allergies  Allergen Reactions  . Amoxicillin-Pot Clavulanate Itching and Nausea Only  . Codeine Nausea Only and Other (See Comments)    GI Upset headaches  . Demerol  [Meperidine] Itching, Nausea Only and Other (See Comments)    GI Upset, Headaches   . Latex Hives and Itching  . Morphine And Related Itching, Nausea Only and Other (See Comments)    headaches  . Oxycodone-Acetaminophen Other (See Comments)  . Penicillins Diarrhea  . Strawberry Extract   . Tramadol Other (See Comments)     GI Upset  . Buprenorphine Hcl Itching and Nausea Only    Other reaction(s): Other (See Comments) headaches  . Fentanyl Itching and Nausea And Vomiting  . Tapentadol Rash    Current Outpatient Prescriptions  Medication Sig Dispense Refill  . amLODipine (NORVASC) 10 MG tablet TAKE ONE TABLET BY MOUTH ONCE DAILY-NEED  APPOINTMENT 90 tablet 2  . Biotin 300 MCG TABS Take 1 tablet by mouth daily.    Marland Kitchen buPROPion (WELLBUTRIN SR) 150 MG 12 hr tablet Take 1 tablet (150 mg total) by mouth 2 (two) times daily. 180 tablet 2  . cetirizine (ZYRTEC) 10 MG tablet Take 1 tablet by mouth 2 (two) times daily. otc    . Cholecalciferol (VITAMIN D-3) 1000 UNITS CAPS Take 1 capsule by mouth daily.    Marland Kitchen dexlansoprazole (DEXILANT) 60 MG capsule Take 1 capsule (60 mg total) by mouth daily. 30 capsule 3  . docusate sodium (COLACE) 100 MG capsule Take 200 mg by mouth daily.    . fluticasone (FLONASE) 50 MCG/ACT nasal spray USE ONE SPRAY(S) IN EACH NOSTRIL ONCE DAILY 16 g 0  . hydrochlorothiazide (HYDRODIURIL) 25 MG tablet Take 1 tablet (25 mg total) by mouth daily. 90 tablet 2  . hyoscyamine (LEVSIN, ANASPAZ) 0.125 MG tablet Take 0.125 mg by mouth 3 (three) times daily. Dr Oliva Bustard    . pantoprazole (PROTONIX) 40 MG tablet TAKE ONE TABLET BY MOUTH TWICE DAILY 180 tablet 0  . ramipril (ALTACE) 10 MG capsule TAKE ONE CAPSULE BY MOUTH ONCE DAILY-MUST SCHEDULE AN APPOINTMENT FOR NOVEMBER 90 capsule 0  . sertraline (ZOLOFT) 25 MG tablet Take 1 tablet (25 mg total) by mouth daily. 90 tablet 2  . simvastatin (ZOCOR) 40 MG tablet Take 1 tablet (40 mg total) by mouth daily. 90 tablet 2  . sotalol  (BETAPACE) 80 MG tablet Take 1 tablet (80 mg total) by mouth 2 (two) times daily. 180 tablet 2  . sucralfate (CARAFATE) 1 GM/10ML suspension Take by mouth.    . sucralfate (CARAFATE) 1 GM/10ML suspension Take 10 mLs (1 g total) by mouth 4 (four) times daily -  with meals and at bedtime. 420 mL 3   No current facility-administered medications for this visit.   Facility-Administered Medications Ordered in Other Visits  Medication Dose Route Frequency Provider Last Rate Last Dose  . 0.9 %  sodium chloride infusion   Intravenous Continuous Evlyn Kanner, NP 999 mL/hr at 07/20/15 1146    . heparin lock flush 100 unit/mL  500 Units Intravenous Once Forest Gleason, MD      . sodium chloride 0.9 % injection 10 mL  10 mL Intravenous PRN Forest Gleason, MD   10 mL at 07/20/15 1147    OBJECTIVE:  Filed Vitals:   03/16/16 1516  BP: 149/76  Pulse: 54  Temp: 96.4 F (35.8 C)  Resp: 18     Body mass index is 25.68 kg/(m^2).    ECOG FS:1 - Symptomatic but completely ambulatory  PHYSICAL EXAM: GENERAL:  Well developed, well nourished, sitting comfortably in the exam room in no acute distress. MENTAL STATUS:  Alert and oriented to person, place and time. HEAD:  Normocephalic, atraumatic, face symmetric, no Cushingoid features.   RESPIRATORY:  Clear to auscultation without rales, wheezes or rhonchi. CARDIOVASCULAR:  Regular rate and rhythm without murmur, rub or gallop.  ABDOMEN:  Soft, non-tender, with active bowel sounds, and no hepatosplenomegaly.  No masses. BACK:  No CVA tenderness.  No tenderness on percussion of the back or rib cage. SKIN:  No rashes, ulcers or lesions. EXTREMITIES: No edema, no skin discoloration or tenderness.  No palpable cords. LYMPH NODES: No palpable cervical, supraclavicular, axillary or inguinal adenopathy  NEUROLOGICAL: Unremarkable. PSYCH:  Appropriate.   LAB RESULTS:  Appointment on 03/16/2016  Component Date Value Ref Range Status  . WBC 03/16/2016 6.1   3.6 - 11.0 K/uL Final  . RBC 03/16/2016 3.89  3.80 - 5.20 MIL/uL Final  . Hemoglobin 03/16/2016 12.1  12.0 - 16.0 g/dL Final  . HCT 03/16/2016 35.7  35.0 - 47.0 % Final  . MCV 03/16/2016 91.8  80.0 - 100.0 fL Final  . MCH 03/16/2016 31.1  26.0 - 34.0 pg Final  . MCHC 03/16/2016 33.9  32.0 - 36.0 g/dL Final  . RDW 03/16/2016 13.9  11.5 - 14.5 % Final  . Platelets 03/16/2016 254  150 - 440 K/uL Final  . Neutrophils Relative % 03/16/2016 45   Final  . Neutro Abs 03/16/2016 2.8  1.4 - 6.5 K/uL Final  . Lymphocytes Relative 03/16/2016 39   Final  . Lymphs Abs 03/16/2016 2.4  1.0 - 3.6 K/uL Final  . Monocytes Relative 03/16/2016 9   Final  . Monocytes Absolute 03/16/2016 0.5  0.2 - 0.9 K/uL Final  . Eosinophils Relative 03/16/2016 6   Final  . Eosinophils Absolute 03/16/2016 0.4  0 - 0.7 K/uL Final  . Basophils Relative 03/16/2016 1   Final  . Basophils Absolute 03/16/2016 0.1  0 - 0.1 K/uL Final  . Sodium 03/16/2016 139  135 - 145 mmol/L Final  . Potassium 03/16/2016 4.0  3.5 - 5.1 mmol/L Final  . Chloride 03/16/2016 102  101 - 111 mmol/L Final  . CO2 03/16/2016 28  22 - 32 mmol/L Final  . Glucose, Bld 03/16/2016 100* 65 - 99 mg/dL Final  . BUN 03/16/2016 19  6 - 20 mg/dL Final  . Creatinine, Ser 03/16/2016 1.03* 0.44 - 1.00 mg/dL Final  . Calcium 03/16/2016 9.1  8.9 - 10.3 mg/dL Final  . Total Protein 03/16/2016 6.9  6.5 - 8.1 g/dL Final  . Albumin 03/16/2016 4.1  3.5 - 5.0 g/dL Final  . AST 03/16/2016 21  15 - 41 U/L Final  . ALT 03/16/2016 15  14 - 54 U/L Final  . Alkaline Phosphatase 03/16/2016 63  38 - 126 U/L Final  . Total Bilirubin 03/16/2016 0.6  0.3 - 1.2 mg/dL Final  . GFR calc non Af Amer 03/16/2016 52* >60 mL/min Final  . GFR calc Af Amer 03/16/2016 >60  >60 mL/min Final   Comment: (NOTE) The eGFR has been calculated using the CKD EPI equation. This calculation has not been validated in all clinical situations. eGFR's persistently <60 mL/min signify possible Chronic  Kidney Disease.   . Anion gap 03/16/2016 9  5 - 15 Final     Surgical Pathology  CASE: [id number]  PATIENT: Kelly Krause  Surgical Pathology Report      SPECIMEN SUBMITTED:  A. Stomach, antrum and body, cbx  B. GEJ at 38 cm, cbx  C. Stomach polyp, greater curvature, cbx  D. Esophagus at 35 cm, cbx  E. Esophagus at 29 to 30 cm, cbx  F. Esophagus at 23 cm, white plaque material   CLINICAL HISTORY:  None provided   PRE-OPERATIVE DIAGNOSIS:  Dysphagia   POST-OPERATIVE DIAGNOSIS:  None provided      DIAGNOSIS:  A. STOMACH, ANTRUM AND BODY; COLD BIOPSY:  - MILD CHRONIC INACTIVE GASTRITIS.  - NO ATROPHY, INTESTINAL METAPLASIA, DYSPLASIA, OR MALIGNANCY.  - NO HELICOBACTER PYLORI SEEN IN HEMATOXYLIN AND EOSIN SECTIONS.   B. GASTROESOPHAGEAL JUNCTION AT 38 CM; COLD BIOPSY:  - SQUAMOCOLUMNAR MUCOSA WITH MILD CHRONIC ACTIVE INFLAMMATION.  - NEGATIVE FOR GOBLET CELLS, DYSPLASIA, AND MALIGNANCY.   C. STOMACH POLYP, GREATER CURVATURE; COLD BIOPSY:  - FUNDIC GLAND POLYP.  - NEGATIVE FOR DYSPLASIA AND MALIGNANCY.   D. ESOPHAGUS AT 35 CM; COLDBIOPSY:  - STRATIFIED SQUAMOUS EPITHELIUM WITH MILD BASAL ATYPIA SUSPICIOUS FOR  LOW-GRADE DYSPLASIA.   E. ESOPHAGUS AT 29-30 CM; COLD BIOPSY:  - HIGH-GRADE SQUAMOUS DYSPLASIA IN 3 OF 4 SAMPLES.   F. ESOPHAGUS AT 23 CM, WHITE PLAQUE; COLD BIOPSY:  - HIGH-GRADE SQUAMOUS DYSPLASIA, KERATINIZING.             ASSESSMENT: Carcinoma of esophagus squamous cell carcinoma stage TIb N0 M0 tumor status post chemoradiation therapy Status post multiple dilated Patient of esophagus and radiofrequency ablation for dysphagia  Continues to have problem with gastroesophageal reflux disease  Patient started gaining weight.  Swallowing is improved.  On clinical examination there is no evidence of recurrent or progressing disease.  MEDICAL DECISION MAKING:  This and was encouraged to make an appointment to see you and see  gastroenterologist for further endoscopy and biopsy  PET scan has been scheduled for two-year assessment.  If insurance does not approve PET scan then a CT scan can be ordered.  I will review PET or CT scan many just done  I also had prolonged discussion with patient (more than 15 minutes, more than 50% of total visit time 30 minutes) regarding getting regular mammograms regular endoscopy done and being followed by oncologist.  In my planned retirement patient will be followed by my associate   Forest Gleason, MD   03/17/2016 8:35 AM

## 2016-03-18 ENCOUNTER — Encounter: Payer: Self-pay | Admitting: Family Medicine

## 2016-03-18 ENCOUNTER — Ambulatory Visit (INDEPENDENT_AMBULATORY_CARE_PROVIDER_SITE_OTHER): Payer: Medicare HMO | Admitting: Family Medicine

## 2016-03-18 ENCOUNTER — Other Ambulatory Visit: Payer: Self-pay

## 2016-03-18 VITALS — BP 130/80 | HR 64 | Ht 67.0 in | Wt 162.0 lb

## 2016-03-18 DIAGNOSIS — R69 Illness, unspecified: Secondary | ICD-10-CM | POA: Diagnosis not present

## 2016-03-18 DIAGNOSIS — F329 Major depressive disorder, single episode, unspecified: Secondary | ICD-10-CM

## 2016-03-18 DIAGNOSIS — Z1239 Encounter for other screening for malignant neoplasm of breast: Secondary | ICD-10-CM

## 2016-03-18 DIAGNOSIS — I10 Essential (primary) hypertension: Secondary | ICD-10-CM | POA: Diagnosis not present

## 2016-03-18 DIAGNOSIS — K219 Gastro-esophageal reflux disease without esophagitis: Secondary | ICD-10-CM

## 2016-03-18 DIAGNOSIS — F32A Depression, unspecified: Secondary | ICD-10-CM

## 2016-03-18 DIAGNOSIS — M9979 Connective tissue and disc stenosis of intervertebral foramina of abdomen and other regions: Secondary | ICD-10-CM

## 2016-03-18 DIAGNOSIS — E7849 Other hyperlipidemia: Secondary | ICD-10-CM

## 2016-03-18 DIAGNOSIS — E784 Other hyperlipidemia: Secondary | ICD-10-CM

## 2016-03-18 MED ORDER — PANTOPRAZOLE SODIUM 40 MG PO TBEC: 40.0000 mg | DELAYED_RELEASE_TABLET | Freq: Two times a day (BID) | ORAL | Status: DC

## 2016-03-18 MED ORDER — AMLODIPINE BESYLATE 10 MG PO TABS: ORAL_TABLET | ORAL | Status: DC

## 2016-03-18 MED ORDER — RAMIPRIL 10 MG PO CAPS: ORAL_CAPSULE | ORAL | Status: DC

## 2016-03-18 MED ORDER — BUPROPION HCL ER (SR) 150 MG PO TB12: 150.0000 mg | ORAL_TABLET | Freq: Two times a day (BID) | ORAL | Status: DC

## 2016-03-18 MED ORDER — HYDROCHLOROTHIAZIDE 25 MG PO TABS: 25.0000 mg | ORAL_TABLET | Freq: Every day | ORAL | Status: DC

## 2016-03-18 MED ORDER — SIMVASTATIN 40 MG PO TABS: 40.0000 mg | ORAL_TABLET | Freq: Every day | ORAL | Status: DC

## 2016-03-18 MED ORDER — SERTRALINE HCL 25 MG PO TABS: 25.0000 mg | ORAL_TABLET | Freq: Every day | ORAL | Status: DC

## 2016-03-18 NOTE — Progress Notes (Signed)
Name: Kelly Krause   MRN: [id number]    DOB: [birth year]   Date:03/18/2016       Progress Note  Subjective  Chief Complaint  Chief Complaint  Patient presents with  . Gastroesophageal Reflux  . Depression  . Hypertension  . Hyperlipidemia    Gastroesophageal Reflux She complains of dysphagia. She reports no abdominal pain, no belching, no chest pain, no choking, no coughing, no early satiety, no globus sensation, no heartburn, no hoarse voice, no nausea, no sore throat, no stridor, no tooth decay, no water brash or no wheezing. This is a chronic problem. The current episode started 1 to 4 weeks ago. The problem occurs frequently. The problem has been gradually worsening. The symptoms are aggravated by certain foods. Pertinent negatives include no anemia, fatigue, melena, muscle weakness, orthopnea or weight loss. The treatment provided mild relief. Past procedures include an EGD.  Depression      The patient presents with depression.  This is a chronic problem.  The onset quality is gradual.   Associated symptoms include no decreased concentration, no fatigue, no helplessness, no hopelessness, does not have insomnia, not irritable, no restlessness, no decreased interest, no appetite change, no body aches, no myalgias, no headaches, no indigestion, not sad and no suicidal ideas.     The symptoms are aggravated by nothing.  Past treatments include SSRIs - Selective serotonin reuptake inhibitors.  Compliance with treatment is good.  Previous treatment provided mild relief.  Past medical history includes chronic illness, terminal illness and depression.     Pertinent negatives include no chronic pain, no fibromyalgia, no hypothyroidism, no anxiety, no eating disorder and no suicide attempts. Hypertension This is a chronic problem. The current episode started more than 1 year ago. The problem has been gradually improving since onset. The problem is controlled. Pertinent negatives include no anxiety,  blurred vision, chest pain, headaches, malaise/fatigue, neck pain, orthopnea, palpitations, peripheral edema, PND, shortness of breath or sweats. There are no associated agents to hypertension. Past treatments include ACE inhibitors, calcium channel blockers and diuretics. Compliance problems include diet.  There is no history of angina, kidney disease, CAD/MI, CVA, heart failure, left ventricular hypertrophy, PVD, renovascular disease or retinopathy. There is no history of chronic renal disease or a hypertension causing med.  Hyperlipidemia This is a chronic problem. The current episode started more than 1 year ago. The problem is controlled. Recent lipid tests were reviewed and are normal. She has no history of chronic renal disease or hypothyroidism. Factors aggravating her hyperlipidemia include thiazides. Pertinent negatives include no chest pain, focal weakness, myalgias or shortness of breath. She is currently on no antihyperlipidemic treatment. The current treatment provides moderate improvement of lipids. There are no compliance problems.     No problem-specific assessment & plan notes found for this encounter.   Past Medical History  Diagnosis Date  . Major depressive disorder, recurrent episode, moderate ([place])   . Persistent disorder of initiating or maintaining sleep   . GERD (gastroesophageal reflux disease)   . Hyperlipidemia   . Hypertension   . Allergy   . [facility]     Past Surgical History  Procedure Laterality Date  . Sinusotomy    . Bunionectomy    . Lobectomy Right   . Partial hysterectomy    . Abdominal hysterectomy    . Esophgeal cancer    . Esophagogastroduodenoscopy N/A 04/21/2015    Procedure: ESOPHAGOGASTRODUODENOSCOPY (EGD);  Surgeon: Lollie Sails, MD;  Location: [facility] ENDOSCOPY;  Service:  Endoscopy;  Laterality: N/A;    Family History  Problem Relation Age of Onset  . Stroke Mother   . Depression Mother   . Alcohol abuse Father   . Stroke Father    . Hypertension Father     Social History   Social History  . Marital Status: Married    Spouse Name: N/A  . Number of Children: N/A  . Years of Education: N/A   Occupational History  . Not on file.   Social History Main Topics  . Smoking status: Former Research scientist (life sciences)  . Smokeless tobacco: Not on file  . Alcohol Use: Not on file  . Drug Use: Not on file  . Sexual Activity: Not Currently   Other Topics Concern  . Not on file   Social History Narrative    Allergies  Allergen Reactions  . Amoxicillin-Pot Clavulanate Itching and Nausea Only  . Codeine Nausea Only and Other (See Comments)    GI Upset headaches  . Demerol [Meperidine] Itching, Nausea Only and Other (See Comments)    GI Upset, Headaches   . Latex Hives and Itching  . Morphine And Related Itching, Nausea Only and Other (See Comments)    headaches  . Oxycodone-Acetaminophen Other (See Comments)  . Penicillins Diarrhea  . Strawberry Extract   . Tramadol Other (See Comments)     GI Upset  . Buprenorphine Hcl Itching and Nausea Only    Other reaction(s): Other (See Comments) headaches  . Fentanyl Itching and Nausea And Vomiting  . Tapentadol Rash     Review of Systems  Constitutional: Negative for fever, chills, weight loss, malaise/fatigue, appetite change and fatigue.  HENT: Negative for ear discharge, ear pain, hoarse voice and sore throat.   Eyes: Negative for blurred vision.  Respiratory: Negative for cough, sputum production, choking, shortness of breath and wheezing.   Cardiovascular: Negative for chest pain, palpitations, orthopnea, leg swelling and PND.  Gastrointestinal: Positive for dysphagia. Negative for heartburn, nausea, abdominal pain, diarrhea, constipation, blood in stool and melena.  Genitourinary: Negative for dysuria, urgency, frequency and hematuria.  Musculoskeletal: Negative for myalgias, back pain, joint pain, muscle weakness and neck pain.  Skin: Negative for rash.  Neurological:  Negative for dizziness, tingling, sensory change, focal weakness and headaches.  Endo/Heme/Allergies: Negative for environmental allergies and polydipsia. Does not bruise/bleed easily.  Psychiatric/Behavioral: Positive for depression. Negative for suicidal ideas and decreased concentration. The patient is not nervous/anxious and does not have insomnia.      Objective  Filed Vitals:   03/18/16 1409  BP: 130/80  Pulse: 64  Height: '5\' 7"'$  (1.702 m)  Weight: 162 lb (73.483 kg)    Physical Exam  Constitutional: She is well-developed, well-nourished, and in no distress. She is not irritable. No distress.  HENT:  Head: Normocephalic and atraumatic.  Right Ear: External ear normal.  Left Ear: External ear normal.  Nose: Nose normal.  Mouth/Throat: Oropharynx is clear and moist.  Eyes: Conjunctivae and EOM are normal. Pupils are equal, round, and reactive to light. Right eye exhibits no discharge. Left eye exhibits no discharge.  Neck: Normal range of motion. Neck supple. No JVD present. No thyromegaly present.  Cardiovascular: Normal rate, regular rhythm, normal heart sounds and intact distal pulses.  Exam reveals no gallop and no friction rub.   No murmur heard. Pulmonary/Chest: Effort normal and breath sounds normal.  Abdominal: Soft. Bowel sounds are normal. She exhibits no mass. There is no tenderness. There is no guarding.  Musculoskeletal: Normal range  of motion. She exhibits no edema.  Lymphadenopathy:    She has no cervical adenopathy.  Neurological: She is alert. She has normal reflexes.  Skin: Skin is warm and dry. She is not diaphoretic.  Psychiatric: Mood and affect normal.  Nursing note and vitals reviewed.     Assessment & Plan  Problem List Items Addressed This Visit      Cardiovascular and Mediastinum   Essential (primary) hypertension - Primary   Relevant Medications   hydrochlorothiazide (HYDRODIURIL) 25 MG tablet   amLODipine (NORVASC) 10 MG tablet    simvastatin (ZOCOR) 40 MG tablet   ramipril (ALTACE) 10 MG capsule   BP (high blood pressure)   Relevant Medications   hydrochlorothiazide (HYDRODIURIL) 25 MG tablet   amLODipine (NORVASC) 10 MG tablet   simvastatin (ZOCOR) 40 MG tablet   ramipril (ALTACE) 10 MG capsule     Digestive   Acid reflux   Relevant Medications   pantoprazole (PROTONIX) 40 MG tablet     Musculoskeletal and Integument   Narrowing of intervertebral disc space     Other   Familial multiple lipoprotein-type hyperlipidemia   Relevant Medications   hydrochlorothiazide (HYDRODIURIL) 25 MG tablet   amLODipine (NORVASC) 10 MG tablet   simvastatin (ZOCOR) 40 MG tablet   ramipril (ALTACE) 10 MG capsule   Other Relevant Orders   Lipid Profile   Clinical depression   Relevant Medications   buPROPion (WELLBUTRIN SR) 150 MG 12 hr tablet   sertraline (ZOLOFT) 25 MG tablet    Other Visit Diagnoses    Depression        Relevant Medications    buPROPion (WELLBUTRIN SR) 150 MG 12 hr tablet    sertraline (ZOLOFT) 25 MG tablet    hydrochlorothiazide (HYDRODIURIL) 25 MG tablet    Breast cancer screening        Relevant Orders    MM Digital Screening    Taking medication for chronic disease        Relevant Orders    Hepatic function panel         Dr. Deanna Jones [facility]  03/18/2016

## 2016-03-19 LAB — HEPATIC FUNCTION PANEL
ALBUMIN: 4.3 g/dL (ref 3.5–4.8)
ALK PHOS: 69 IU/L (ref 39–117)
ALT: 16 IU/L (ref 0–32)
AST: 21 IU/L (ref 0–40)
BILIRUBIN TOTAL: 0.4 mg/dL (ref 0.0–1.2)
BILIRUBIN, DIRECT: 0.14 mg/dL (ref 0.00–0.40)
Total Protein: 6.9 g/dL (ref 6.0–8.5)

## 2016-03-19 LAB — LIPID PANEL
CHOLESTEROL TOTAL: 195 mg/dL (ref 100–199)
Chol/HDL Ratio: 2.6 ratio units (ref 0.0–4.4)
HDL: 74 mg/dL (ref >39)
LDL Calculated: 97 mg/dL (ref 0–99)
Triglycerides: 118 mg/dL (ref 0–149)
VLDL Cholesterol Cal: 24 mg/dL (ref 5–40)

## 2016-03-31 ENCOUNTER — Other Ambulatory Visit: Payer: Self-pay | Admitting: *Deleted

## 2016-03-31 ENCOUNTER — Other Ambulatory Visit: Payer: Self-pay

## 2016-03-31 DIAGNOSIS — C159 Malignant neoplasm of esophagus, unspecified: Secondary | ICD-10-CM

## 2016-03-31 DIAGNOSIS — H16223 Keratoconjunctivitis sicca, not specified as Sjogren's, bilateral: Secondary | ICD-10-CM | POA: Diagnosis not present

## 2016-04-01 ENCOUNTER — Ambulatory Visit: Payer: Medicare HMO

## 2016-04-07 ENCOUNTER — Ambulatory Visit
Admission: RE | Admit: 2016-04-07 | Discharge: 2016-04-07 | Disposition: A | Discharge status: Home / Self Care | Payer: Medicare HMO | Source: Ambulatory Visit | Attending: Family Medicine | Admitting: Family Medicine

## 2016-04-07 ENCOUNTER — Ambulatory Visit: Payer: Medicare HMO

## 2016-04-07 DIAGNOSIS — Z1231 Encounter for screening mammogram for malignant neoplasm of breast: Secondary | ICD-10-CM | POA: Insufficient documentation

## 2016-04-07 DIAGNOSIS — Z1239 Encounter for other screening for malignant neoplasm of breast: Secondary | ICD-10-CM

## 2016-04-07 HISTORY — DX: Malignant neoplasm of colon, unspecified: C18.9

## 2016-04-08 ENCOUNTER — Ambulatory Visit
Admission: RE | Admit: 2016-04-08 | Discharge: 2016-04-08 | Disposition: A | Discharge status: Home / Self Care | Payer: Medicare HMO | Source: Ambulatory Visit | Attending: Oncology | Admitting: Oncology

## 2016-04-08 DIAGNOSIS — R918 Other nonspecific abnormal finding of lung field: Secondary | ICD-10-CM | POA: Diagnosis not present

## 2016-04-08 DIAGNOSIS — C159 Malignant neoplasm of esophagus, unspecified: Secondary | ICD-10-CM | POA: Diagnosis not present

## 2016-04-08 DIAGNOSIS — I2510 Atherosclerotic heart disease of native coronary artery without angina pectoris: Secondary | ICD-10-CM | POA: Diagnosis not present

## 2016-04-08 DIAGNOSIS — C189 Malignant neoplasm of colon, unspecified: Secondary | ICD-10-CM | POA: Diagnosis not present

## 2016-04-08 MED ORDER — IOPAMIDOL (ISOVUE-300) INJECTION 61%
125.0000 mL | Freq: Once | INTRAVENOUS | Status: AC | PRN
  Administered 2016-04-08: 125 mL via INTRAVENOUS

## 2016-04-13 ENCOUNTER — Telehealth: Payer: Self-pay | Admitting: *Deleted

## 2016-04-13 NOTE — Telephone Encounter (Signed)
Per Dr. Oliva Bustard, pt has normal CT scan and can be scheduled for [facility] removal with Dr. Lucky Cowboy.

## 2016-04-13 NOTE — Telephone Encounter (Signed)
Called patient and left message that CT was normal.  MD will make arrangements with Dr. Lucky Cowboy to remove PAC.

## 2016-04-19 ENCOUNTER — Other Ambulatory Visit: Payer: Self-pay | Admitting: Family Medicine

## 2016-04-20 DIAGNOSIS — C159 Malignant neoplasm of esophagus, unspecified: Secondary | ICD-10-CM | POA: Diagnosis not present

## 2016-04-21 ENCOUNTER — Encounter: Payer: Self-pay | Admitting: *Deleted

## 2016-04-21 NOTE — Progress Notes (Signed)
Received notification from [facility] that they have made 3 attempts to contact patient to schedule an appointment for [facility] removal but pt was not able to be reached. If pt wishes to get an appt scheduled for [facility] removal then she will need to contact [facility] at [phone].

## 2016-04-25 ENCOUNTER — Ambulatory Visit (INDEPENDENT_AMBULATORY_CARE_PROVIDER_SITE_OTHER): Payer: Medicare HMO | Admitting: Family Medicine

## 2016-04-25 ENCOUNTER — Ambulatory Visit
Admission: RE | Admit: 2016-04-25 | Discharge: 2016-04-25 | Disposition: A | Discharge status: Home / Self Care | Payer: Medicare HMO | Source: Ambulatory Visit | Attending: Family Medicine | Admitting: Family Medicine

## 2016-04-25 ENCOUNTER — Encounter: Payer: Self-pay | Admitting: Family Medicine

## 2016-04-25 VITALS — BP 120/80 | HR 60 | Ht 67.0 in | Wt 161.0 lb

## 2016-04-25 DIAGNOSIS — M79642 Pain in left hand: Secondary | ICD-10-CM | POA: Diagnosis not present

## 2016-04-25 DIAGNOSIS — M7989 Other specified soft tissue disorders: Secondary | ICD-10-CM | POA: Diagnosis not present

## 2016-04-25 DIAGNOSIS — M1990 Unspecified osteoarthritis, unspecified site: Secondary | ICD-10-CM

## 2016-04-25 NOTE — Progress Notes (Signed)
Name: Kelly Krause   MRN: [id number]    DOB: 1941-08-10   Date:04/25/2016       Progress Note  Subjective  Chief Complaint  Chief Complaint  Patient presents with  . Hand Pain    pain especially when making a fist or using it to grip something.- came up about 6 weeks ago- hasn't got any better or worse    Hand Pain  The incident occurred more than 1 week ago. The incident occurred in the yard. The pain is present in the left hand. The quality of the pain is described as aching. The pain is at a severity of 5/10. The pain is mild. The pain has been fluctuating since the incident. Pertinent negatives include no chest pain or tingling. The symptoms are aggravated by a foreign body. She has tried NSAIDs for the symptoms. The treatment provided mild relief.    No problem-specific assessment & plan notes found for this encounter.   Past Medical History  Diagnosis Date  . Major depressive disorder, recurrent episode, moderate ([place])   . Persistent disorder of initiating or maintaining sleep   . GERD (gastroesophageal reflux disease)   . Hyperlipidemia   . Hypertension   . Allergy   . Cancer ([place])     esophagus/chemo and rad  . Colon cancer ([place])     chemo/rad 15 yrs    Past Surgical History  Procedure Laterality Date  . Sinusotomy    . Bunionectomy    . Lobectomy Right   . Partial hysterectomy    . Abdominal hysterectomy    . Esophgeal cancer    . Esophagogastroduodenoscopy N/A 04/21/2015    Procedure: ESOPHAGOGASTRODUODENOSCOPY (EGD);  Surgeon: Lollie Sails, MD;  Location: [facility] END[facility];  Service: Endoscopy;  Laterality: N/A;    Family History  Problem Relation Age of Onset  . Stroke Mother   . Depression Mother   . Breast cancer Mother 26  . Alcohol abuse Father   . Stroke Father   . Hypertension Father   . Breast cancer Maternal Aunt     Social History   Social History  . Marital Status: Married    Spouse Name: N/A  . Number of Children: N/A  . Years of  Education: N/A   Occupational History  . Not on file.   Social History Main Topics  . Smoking status: Former Research scientist (life sciences)  . Smokeless tobacco: Not on file  . Alcohol Use: Not on file  . Drug Use: Not on file  . Sexual Activity: Not Currently   Other Topics Concern  . Not on file   Social History Narrative    Allergies  Allergen Reactions  . Amoxicillin-Pot Clavulanate Itching and Nausea Only  . Codeine Nausea Only and Other (See Comments)    GI Upset headaches  . Demerol [Meperidine] Itching, Nausea Only and Other (See Comments)    GI Upset, Headaches   . Latex Hives and Itching  . Morphine And Related Itching, Nausea Only and Other (See Comments)    headaches  . Oxycodone-Acetaminophen Other (See Comments)  . Penicillins Diarrhea  . Strawberry Extract   . Tramadol Other (See Comments)     GI Upset  . Buprenorphine Hcl Itching and Nausea Only    Other reaction(s): Other (See Comments) headaches  . Fentanyl Itching and Nausea And Vomiting  . Tapentadol Rash     Review of Systems  Constitutional: Negative for fever, chills, weight loss and malaise/fatigue.  HENT: Negative for ear discharge, ear  pain and sore throat.   Eyes: Negative for blurred vision.  Respiratory: Negative for cough, sputum production, shortness of breath and wheezing.   Cardiovascular: Negative for chest pain, palpitations and leg swelling.  Gastrointestinal: Negative for heartburn, nausea, abdominal pain, diarrhea, constipation, blood in stool and melena.  Genitourinary: Negative for dysuria, urgency, frequency and hematuria.  Musculoskeletal: Positive for joint pain. Negative for myalgias, back pain and neck pain.  Skin: Negative for rash.  Neurological: Negative for dizziness, tingling, sensory change, focal weakness and headaches.  Endo/Heme/Allergies: Negative for environmental allergies and polydipsia. Does not bruise/bleed easily.  Psychiatric/Behavioral: Negative for depression and suicidal  ideas. The patient is not nervous/anxious and does not have insomnia.      Objective  Filed Vitals:   04/25/16 1104  BP: 120/80  Pulse: 60  Height: _0  (1.702 m)  Weight: 161 lb (73.029 kg)    Physical Exam  Constitutional: She is well-developed, well-nourished, and in no distress. No distress.  HENT:  Head: Normocephalic and atraumatic.  Right Ear: External ear normal.  Left Ear: External ear normal.  Nose: Nose normal.  Mouth/Throat: Oropharynx is clear and moist.  Eyes: Conjunctivae and EOM are normal. Pupils are equal, round, and reactive to light. Right eye exhibits no discharge. Left eye exhibits no discharge.  Neck: Normal range of motion. Neck supple. No JVD present. No thyromegaly present.  Cardiovascular: Normal rate, regular rhythm, normal heart sounds and intact distal pulses.  Exam reveals no gallop and no friction rub.   No murmur heard. Pulmonary/Chest: Effort normal and breath sounds normal.  Abdominal: Soft. Bowel sounds are normal. She exhibits no mass. There is no tenderness. There is no guarding.  Musculoskeletal: Normal range of motion. She exhibits no edema.       Left hand: She exhibits tenderness, bony tenderness and swelling. She exhibits normal range of motion.       Hands: Lymphadenopathy:    She has no cervical adenopathy.  Neurological: She is alert. She has normal reflexes.  Skin: Skin is warm and dry. She is not diaphoretic.  Psychiatric: Mood and affect normal.  Nursing note and vitals reviewed.     Assessment & Plan  Problem List Items Addressed This Visit    None    Visit Diagnoses    Arthritis    -  Primary    monoarticular    Relevant Orders    Sed Rate (ESR)    Rheumatoid Factor    DG Hand Complete Left         Dr. Otilio Miu [facility]  04/25/2016

## 2016-04-26 LAB — SEDIMENTATION RATE: Sed Rate: 3 mm/hr (ref 0–40)

## 2016-04-26 LAB — RHEUMATOID FACTOR: Rhuematoid fact SerPl-aCnc: <10 IU/mL (ref 0.0–13.9)

## 2016-05-16 ENCOUNTER — Other Ambulatory Visit: Payer: Self-pay

## 2016-05-17 DIAGNOSIS — K219 Gastro-esophageal reflux disease without esophagitis: Secondary | ICD-10-CM | POA: Diagnosis not present

## 2016-05-17 DIAGNOSIS — I10 Essential (primary) hypertension: Secondary | ICD-10-CM | POA: Diagnosis not present

## 2016-05-17 DIAGNOSIS — Z85038 Personal history of other malignant neoplasm of large intestine: Secondary | ICD-10-CM | POA: Diagnosis not present

## 2016-05-17 DIAGNOSIS — R69 Illness, unspecified: Secondary | ICD-10-CM | POA: Diagnosis not present

## 2016-05-17 DIAGNOSIS — Z87891 Personal history of nicotine dependence: Secondary | ICD-10-CM | POA: Diagnosis not present

## 2016-05-17 DIAGNOSIS — Z885 Allergy status to narcotic agent status: Secondary | ICD-10-CM | POA: Diagnosis not present

## 2016-05-17 DIAGNOSIS — Z8673 Personal history of transient ischemic attack (TIA), and cerebral infarction without residual deficits: Secondary | ICD-10-CM | POA: Diagnosis not present

## 2016-05-17 DIAGNOSIS — K228 Other specified diseases of esophagus: Secondary | ICD-10-CM | POA: Diagnosis not present

## 2016-05-17 DIAGNOSIS — D490 Neoplasm of unspecified behavior of digestive system: Secondary | ICD-10-CM | POA: Diagnosis not present

## 2016-05-17 DIAGNOSIS — K22711 Barrett's esophagus with high grade dysplasia: Secondary | ICD-10-CM | POA: Diagnosis not present

## 2016-05-17 DIAGNOSIS — K222 Esophageal obstruction: Secondary | ICD-10-CM | POA: Diagnosis not present

## 2016-05-17 DIAGNOSIS — Z8501 Personal history of malignant neoplasm of esophagus: Secondary | ICD-10-CM | POA: Diagnosis not present

## 2016-05-17 DIAGNOSIS — K2210 Ulcer of esophagus without bleeding: Secondary | ICD-10-CM | POA: Diagnosis not present

## 2016-05-27 ENCOUNTER — Other Ambulatory Visit: Payer: Self-pay | Admitting: Family Medicine

## 2016-06-14 DIAGNOSIS — R220 Localized swelling, mass and lump, head: Secondary | ICD-10-CM | POA: Diagnosis not present

## 2016-06-14 DIAGNOSIS — T7840XA Allergy, unspecified, initial encounter: Secondary | ICD-10-CM | POA: Diagnosis not present

## 2016-06-14 DIAGNOSIS — X58XXXA Exposure to other specified factors, initial encounter: Secondary | ICD-10-CM | POA: Diagnosis not present

## 2016-07-19 DIAGNOSIS — Z885 Allergy status to narcotic agent status: Secondary | ICD-10-CM | POA: Diagnosis not present

## 2016-07-19 DIAGNOSIS — R69 Illness, unspecified: Secondary | ICD-10-CM | POA: Diagnosis not present

## 2016-07-19 DIAGNOSIS — K219 Gastro-esophageal reflux disease without esophagitis: Secondary | ICD-10-CM | POA: Diagnosis not present

## 2016-07-19 DIAGNOSIS — C159 Malignant neoplasm of esophagus, unspecified: Secondary | ICD-10-CM | POA: Diagnosis not present

## 2016-07-19 DIAGNOSIS — K449 Diaphragmatic hernia without obstruction or gangrene: Secondary | ICD-10-CM | POA: Diagnosis not present

## 2016-07-19 DIAGNOSIS — I10 Essential (primary) hypertension: Secondary | ICD-10-CM | POA: Diagnosis not present

## 2016-07-19 DIAGNOSIS — Z85038 Personal history of other malignant neoplasm of large intestine: Secondary | ICD-10-CM | POA: Diagnosis not present

## 2016-07-19 DIAGNOSIS — K222 Esophageal obstruction: Secondary | ICD-10-CM | POA: Diagnosis not present

## 2016-07-19 DIAGNOSIS — Z8673 Personal history of transient ischemic attack (TIA), and cerebral infarction without residual deficits: Secondary | ICD-10-CM | POA: Diagnosis not present

## 2016-07-19 DIAGNOSIS — K2210 Ulcer of esophagus without bleeding: Secondary | ICD-10-CM | POA: Diagnosis not present

## 2016-07-19 DIAGNOSIS — K200 Eosinophilic esophagitis: Secondary | ICD-10-CM | POA: Diagnosis not present

## 2016-08-16 DIAGNOSIS — Z8501 Personal history of malignant neoplasm of esophagus: Secondary | ICD-10-CM | POA: Diagnosis not present

## 2016-08-16 DIAGNOSIS — R1310 Dysphagia, unspecified: Secondary | ICD-10-CM | POA: Diagnosis not present

## 2016-08-22 ENCOUNTER — Other Ambulatory Visit: Payer: Self-pay | Admitting: Family Medicine

## 2016-08-22 DIAGNOSIS — I499 Cardiac arrhythmia, unspecified: Secondary | ICD-10-CM

## 2016-08-23 DIAGNOSIS — R69 Illness, unspecified: Secondary | ICD-10-CM | POA: Diagnosis not present

## 2016-08-24 ENCOUNTER — Other Ambulatory Visit: Payer: Self-pay

## 2016-09-15 ENCOUNTER — Other Ambulatory Visit: Payer: Self-pay

## 2016-09-15 ENCOUNTER — Ambulatory Visit: Payer: Self-pay | Admitting: Hematology and Oncology

## 2016-09-20 ENCOUNTER — Other Ambulatory Visit: Payer: Self-pay | Admitting: *Deleted

## 2016-09-20 DIAGNOSIS — C159 Malignant neoplasm of esophagus, unspecified: Secondary | ICD-10-CM

## 2016-09-21 ENCOUNTER — Other Ambulatory Visit: Payer: Self-pay | Admitting: *Deleted

## 2016-09-21 ENCOUNTER — Inpatient Hospital Stay: Payer: Medicare HMO | Attending: Hematology and Oncology

## 2016-09-21 ENCOUNTER — Inpatient Hospital Stay (HOSPITAL_BASED_OUTPATIENT_CLINIC_OR_DEPARTMENT_OTHER): Payer: Medicare HMO | Admitting: Hematology and Oncology

## 2016-09-21 VITALS — BP 157/66 | HR 54 | Temp 97.0°F | Ht 68.0 in | Wt 166.2 lb

## 2016-09-21 DIAGNOSIS — Z87891 Personal history of nicotine dependence: Secondary | ICD-10-CM | POA: Insufficient documentation

## 2016-09-21 DIAGNOSIS — R1310 Dysphagia, unspecified: Secondary | ICD-10-CM | POA: Insufficient documentation

## 2016-09-21 DIAGNOSIS — Z79899 Other long term (current) drug therapy: Secondary | ICD-10-CM

## 2016-09-21 DIAGNOSIS — I10 Essential (primary) hypertension: Secondary | ICD-10-CM | POA: Diagnosis not present

## 2016-09-21 DIAGNOSIS — K219 Gastro-esophageal reflux disease without esophagitis: Secondary | ICD-10-CM | POA: Insufficient documentation

## 2016-09-21 DIAGNOSIS — K224 Dyskinesia of esophagus: Secondary | ICD-10-CM | POA: Diagnosis not present

## 2016-09-21 DIAGNOSIS — K222 Esophageal obstruction: Secondary | ICD-10-CM | POA: Insufficient documentation

## 2016-09-21 DIAGNOSIS — C159 Malignant neoplasm of esophagus, unspecified: Secondary | ICD-10-CM | POA: Diagnosis not present

## 2016-09-21 DIAGNOSIS — E785 Hyperlipidemia, unspecified: Secondary | ICD-10-CM | POA: Insufficient documentation

## 2016-09-21 DIAGNOSIS — F329 Major depressive disorder, single episode, unspecified: Secondary | ICD-10-CM | POA: Insufficient documentation

## 2016-09-21 DIAGNOSIS — Z9223 Personal history of estrogen therapy: Secondary | ICD-10-CM | POA: Diagnosis not present

## 2016-09-21 DIAGNOSIS — Z902 Acquired absence of lung [part of]: Secondary | ICD-10-CM | POA: Diagnosis not present

## 2016-09-21 DIAGNOSIS — Z86010 Personal history of colonic polyps: Secondary | ICD-10-CM | POA: Diagnosis not present

## 2016-09-21 DIAGNOSIS — R69 Illness, unspecified: Secondary | ICD-10-CM | POA: Diagnosis not present

## 2016-09-21 DIAGNOSIS — Z85038 Personal history of other malignant neoplasm of large intestine: Secondary | ICD-10-CM

## 2016-09-21 DIAGNOSIS — G4700 Insomnia, unspecified: Secondary | ICD-10-CM | POA: Diagnosis not present

## 2016-09-21 DIAGNOSIS — Z803 Family history of malignant neoplasm of breast: Secondary | ICD-10-CM | POA: Diagnosis not present

## 2016-09-21 DIAGNOSIS — Z923 Personal history of irradiation: Secondary | ICD-10-CM | POA: Insufficient documentation

## 2016-09-21 LAB — COMPREHENSIVE METABOLIC PANEL
ALT: 14 U/L (ref 14–54)
AST: 20 U/L (ref 15–41)
Albumin: 4 g/dL (ref 3.5–5.0)
Alkaline Phosphatase: 63 U/L (ref 38–126)
Anion gap: 7 (ref 5–15)
BUN: 21 mg/dL — ABNORMAL HIGH (ref 6–20)
CO2: 30 mmol/L (ref 22–32)
Calcium: 8.8 mg/dL — ABNORMAL LOW (ref 8.9–10.3)
Chloride: 99 mmol/L — ABNORMAL LOW (ref 101–111)
Creatinine, Ser: 0.94 mg/dL (ref 0.44–1.00)
GFR calc Af Amer: >60 mL/min (ref >60)
GFR calc non Af Amer: 57 mL/min — ABNORMAL LOW (ref >60)
Glucose, Bld: 96 mg/dL (ref 65–99)
Potassium: 3.7 mmol/L (ref 3.5–5.1)
Sodium: 136 mmol/L (ref 135–145)
Total Bilirubin: 0.7 mg/dL (ref 0.3–1.2)
Total Protein: 7.1 g/dL (ref 6.5–8.1)

## 2016-09-21 LAB — CBC WITH DIFFERENTIAL/PLATELET
Basophils Absolute: 0.1 10*3/uL (ref 0–0.1)
Basophils Relative: 1 %
Eosinophils Absolute: 0.4 10*3/uL (ref 0–0.7)
Eosinophils Relative: 6 %
HCT: 36.2 % (ref 35.0–47.0)
Hemoglobin: 11.9 g/dL — ABNORMAL LOW (ref 12.0–16.0)
Lymphocytes Relative: 36 %
Lymphs Abs: 2.2 10*3/uL (ref 1.0–3.6)
MCH: 30.2 pg (ref 26.0–34.0)
MCHC: 32.9 g/dL (ref 32.0–36.0)
MCV: 92 fL (ref 80.0–100.0)
Monocytes Absolute: 0.5 10*3/uL (ref 0.2–0.9)
Monocytes Relative: 9 %
Neutro Abs: 2.9 10*3/uL (ref 1.4–6.5)
Neutrophils Relative %: 48 %
Platelets: 262 10*3/uL (ref 150–440)
RBC: 3.93 MIL/uL (ref 3.80–5.20)
RDW: 13.9 % (ref 11.5–14.5)
WBC: 6.2 10*3/uL (ref 3.6–11.0)

## 2016-09-21 NOTE — Progress Notes (Signed)
[facility] day:  09/21/2016  Chief Complaint: Kelly Krause is a 75 y.o. female with a history of stage IV colon cancer (1999) and stage IB esophageal cancer (2015) who is seen for reassessment.  HPI:  The patient notes a history of metastatic colon cancer 18 years ago.   She states that she had a CXR which revealed something in her right chest.  She underwent right lobectomy in [place], [place].  Pathology suggested metastatic colon cancer.  She underwent colonoscopy which revealed a lesion in the descending colon.  She describes "a year of weekly chemotherapy".  She has had no evidence of recurrence.  Her last colonoscopy was 4 years ago.  She denies any family history of colon cancer.  She presented with dysphagia in 2015.  EGD by Dr. Gustavo Lah on 03/04/2014 revealed squamous cell carcinoma at 29-30 cm.  PET scan on 03/24/2014 revealed focal hypermetabolism in the distal esophagus, compatible with the patient's known esophageal neoplasm.  There was no evidence for hypermetabolic metastatic disease.  She was diagnosed with early stage (T1bN0M0) squamous cell carcinoma of the esophagus.    She received concurrent radiation and chemotherapy x 5 weeks beginning 03/2014.  Treatment completed 05/2014.  She declined surgical evaluation.    EGD on 04/21/2015 by Dr. Gustavo Lah revealed gastritis and a single gastric polyp.  There was evidence of previous XRT in the esophagus without stenosis or narrowing.  Esophageal pathology revealed high grade dysplasia at 23 cm, high grade dysplasia at 29-30 cm, and low grade dysplasia at 35 cm.  Patient was last seen by Dr. Oliva Bustard on 03/16/2016.  At that time, she was noted to have ongoing issues with reflux.  PET scan was scheduled for 2 year assessment.  Upper endoscopy by Dr. Renford Dills at [facility] on 05/17/2016 revealed esophageal stenosis (dilated), a few non-bleeding erosions in the middle third of the esophagus  (biopsied).  Z-line irregular, 35 cm from the incisors.  Chromoscopy was performed in the entire esophagus.  Destruction of remaining portion of lesion in the middle third of the esophagus with argon plasma coagulation (APC) was performed.  Upper endoscopy by Dr. Renford Dills at [facility] on 07/19/2016 revealed benign-appearing esophageal stenosis.  There were a few non-bleeding erosions in the middle third of the esophagus.  There was nodular heterogeneously stained areas in the esophagus (biopsied) and treated with argon plasma coagulation (APC).  Z-line regular, 37 cm from the incisors.  She states that she undergoes endoscopy every 5-6 weeks.  She has had 8 esophageal dilatations by Dr. Adria Devon at [facility].  She notes still having "problems with swallowing'.  She notes esophageal spasms.  She has "horrible reflux" and is on Protonix BID.   Past Medical History:  Diagnosis Date  . Allergy   . Cancer ([place])    esophagus/chemo and rad  . Colon cancer ([place])    chemo/rad 15 yrs  . GERD (gastroesophageal reflux disease)   . Hyperlipidemia   . Hypertension   . Major depressive disorder, recurrent episode, moderate ([place])   . Persistent disorder of initiating or maintaining sleep     Past Surgical History:  Procedure Laterality Date  . ABDOMINAL HYSTERECTOMY    . BUNIONECTOMY    . ESOPHAGOGASTRODUODENOSCOPY N/A 04/21/2015   Procedure: ESOPHAGOGASTRODUODENOSCOPY (EGD);  Surgeon: Lollie Sails, MD;  Location: [facility] ENDOSCOPY;  Service: Endoscopy;  Laterality: N/A;  . Esophgeal cancer    . LOBECTOMY Right   . PARTIAL HYSTERECTOMY    .  SINUSOTOMY      Family History  Problem Relation Age of Onset  . Stroke Mother   . Depression Mother   . Breast cancer Mother 36  . Alcohol abuse Father   . Stroke Father   . Hypertension Father   . Breast cancer Maternal Aunt     Social History:  reports that she has quit smoking. She does not have any smokeless tobacco history on file. Her alcohol and  drug histories are not on file.  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  She goes to [place], [place] from December to April.  She lives in [place].  The patient is alone today.  Allergies:  Allergies  Allergen Reactions  . Amoxicillin-Pot Clavulanate Itching and Nausea Only  . Codeine Nausea Only and Other (See Comments)    GI Upset headaches  . Demerol [Meperidine] Itching, Nausea Only and Other (See Comments)    GI Upset, Headaches   . Latex Hives and Itching  . Morphine And Related Itching, Nausea Only and Other (See Comments)    headaches  . Oxycodone-Acetaminophen Other (See Comments)  . Penicillins Diarrhea  . Strawberry Extract   . Tramadol Other (See Comments)     GI Upset  . Buprenorphine Hcl Itching and Nausea Only    Other reaction(s): Other (See Comments) headaches  . Fentanyl Itching and Nausea And Vomiting  . Tapentadol Rash    Current Medications: Current Outpatient Prescriptions  Medication Sig Dispense Refill  . amLODipine (NORVASC) 10 MG tablet TAKE ONE TABLET BY MOUTH ONCE DAILY-NEED  APPOINTMENT 90 tablet 2  . Biotin 300 MCG TABS Take 1 tablet by mouth daily.    Marland Kitchen buPROPion (WELLBUTRIN SR) 150 MG 12 hr tablet Take 1 tablet (150 mg total) by mouth 2 (two) times daily. 180 tablet 2  . cetirizine (ZYRTEC) 10 MG tablet Take 1 tablet by mouth 2 (two) times daily. otc    . Cholecalciferol (VITAMIN D-3) 1000 UNITS CAPS Take 1 capsule by mouth daily.    . diphenhydrAMINE (BENADRYL) 25 mg capsule Take 25 mg by mouth.    . docusate sodium (COLACE) 100 MG capsule Take 200 mg by mouth daily.    . fluticasone (FLONASE) 50 MCG/ACT nasal spray USE ONE SPRAY(S) IN EACH NOSTRIL ONCE DAILY 16 g 0  . hydrochlorothiazide (HYDRODIURIL) 25 MG tablet Take 1 tablet (25 mg total) by mouth daily. 90 tablet 2  . pantoprazole (PROTONIX) 40 MG tablet Take 1 tablet (40 mg total) by mouth 2 (two) times daily.  180 tablet 2  . sertraline (ZOLOFT) 25 MG tablet Take 1 tablet (25 mg total) by mouth daily. 90 tablet 2  . simvastatin (ZOCOR) 40 MG tablet Take 1 tablet (40 mg total) by mouth daily. 90 tablet 2  . sotalol (BETAPACE) 80 MG tablet TAKE ONE TABLET BY MOUTH TWICE DAILY 60 tablet 0  . sucralfate (CARAFATE) 1 GM/10ML suspension Take by mouth.    . hyoscyamine (LEVSIN, ANASPAZ) 0.125 MG tablet Take 0.125 mg by mouth 3 (three) times daily. Dr Oliva Bustard    . ramipril (ALTACE) 10 MG capsule TAKE ONE CAPSULE BY MOUTH ONCE DAILY. MUST SCHEDULE APPOINTMENT FOR NOVEMBER 30 capsule 0   No current facility-administered medications for this visit.    Facility-Administered Medications Ordered in Other Visits  Medication Dose Route Frequency Provider Last Rate Last Dose  . 0.9 %  sodium chloride infusion  Intravenous Continuous Evlyn Kanner, NP 999 mL/hr at 07/20/15 1146    . heparin lock flush 100 unit/mL  500 Units Intravenous Once Forest Gleason, MD      . sodium chloride 0.9 % injection 10 mL  10 mL Intravenous PRN Forest Gleason, MD   10 mL at 07/20/15 1147    Review of Systems:  GENERAL:  Feels good.  No fevers or sweats.  Weight up 5 pounds in past year. PERFORMANCE STATUS (ECOG):  1 HEENT:  No visual changes, runny nose, sore throat, mouth sores or tenderness. Lungs: No shortness of breath or cough.  No hemoptysis. Cardiac:  No chest pain, palpitations, orthopnea, or PND. GI:  Dysphagia.  Esophageal spasms.  Reflux.  No nausea, vomiting, diarrhea, constipation, melena or hematochezia. GU:  No urgency, frequency, dysuria, or hematuria. Musculoskeletal:  Decreased ability to turn head without pain secondary to "deterioration of bones in neck".  No joint pain.  No muscle tenderness. Extremities:  No pain or swelling. Skin:  No rashes or skin changes. Neuro:  No headache, numbness or weakness, balance or coordination issues. Endocrine:  No diabetes, thyroid issues, hot flashes or night sweats. Psych:   No mood changes, depression or anxiety. Pain:  No focal pain. Review of systems:  All other systems reviewed and found to be negative.  Physical Exam: Blood pressure (!) 157/66, pulse (!) 54, temperature 97 F (36.1 C), temperature source Tympanic, height 5' 8"  (1.727 m), weight 166 lb 3.6 oz (75.4 kg). GENERAL:  Well developed, well nourished, woman sitting comfortably in the exam room in no acute distress. MENTAL STATUS:  Alert and oriented to person, place and time. HEAD:  Short styled hair.  Normocephalic, atraumatic, face symmetric, no Cushingoid features. EYES:  Glasses.  Blue eyes.  Pupils equal round and reactive to light and accomodation.  No conjunctivitis or scleral icterus. ENT:  Oropharynx clear without lesion.  Tongue normal. Mucous membranes moist.  RESPIRATORY:  Clear to auscultation without rales, wheezes or rhonchi. CARDIOVASCULAR:  Regular rate and rhythm without murmur, rub or gallop. ABDOMEN:  Soft, non-tender, with active bowel sounds, and no hepatosplenomegaly.  No masses. SKIN:  No rashes, ulcers or lesions. EXTREMITIES: No edema, no skin discoloration or tenderness.  No palpable cords. LYMPH NODES: No palpable cervical, supraclavicular, axillary or inguinal adenopathy  NEUROLOGICAL: Unremarkable. PSYCH:  Appropriate.   Appointment on 09/21/2016  Component Date Value Ref Range Status  . WBC 09/21/2016 6.2  3.6 - 11.0 K/uL Final  . RBC 09/21/2016 3.93  3.80 - 5.20 MIL/uL Final  . Hemoglobin 09/21/2016 11.9* 12.0 - 16.0 g/dL Final  . HCT 09/21/2016 36.2  35.0 - 47.0 % Final  . MCV 09/21/2016 92.0  80.0 - 100.0 fL Final  . MCH 09/21/2016 30.2  26.0 - 34.0 pg Final  . MCHC 09/21/2016 32.9  32.0 - 36.0 g/dL Final  . RDW 09/21/2016 13.9  11.5 - 14.5 % Final  . Platelets 09/21/2016 262  150 - 440 K/uL Final  . Neutrophils Relative % 09/21/2016 48  % Final  . Neutro Abs 09/21/2016 2.9  1.4 - 6.5 K/uL Final  . Lymphocytes Relative 09/21/2016 36  % Final  . Lymphs  Abs 09/21/2016 2.2  1.0 - 3.6 K/uL Final  . Monocytes Relative 09/21/2016 9  % Final  . Monocytes Absolute 09/21/2016 0.5  0.2 - 0.9 K/uL Final  . Eosinophils Relative 09/21/2016 6  % Final  . Eosinophils Absolute 09/21/2016 0.4  0 - 0.7 K/uL Final  .  Basophils Relative 09/21/2016 1  % Final  . Basophils Absolute 09/21/2016 0.1  0 - 0.1 K/uL Final  . Sodium 09/21/2016 136  135 - 145 mmol/L Final  . Potassium 09/21/2016 3.7  3.5 - 5.1 mmol/L Final  . Chloride 09/21/2016 99* 101 - 111 mmol/L Final  . CO2 09/21/2016 30  22 - 32 mmol/L Final  . Glucose, Bld 09/21/2016 96  65 - 99 mg/dL Final  . BUN 09/21/2016 21* 6 - 20 mg/dL Final  . Creatinine, Ser 09/21/2016 0.94  0.44 - 1.00 mg/dL Final  . Calcium 09/21/2016 8.8* 8.9 - 10.3 mg/dL Final  . Total Protein 09/21/2016 7.1  6.5 - 8.1 g/dL Final  . Albumin 09/21/2016 4.0  3.5 - 5.0 g/dL Final  . AST 09/21/2016 20  15 - 41 U/L Final  . ALT 09/21/2016 14  14 - 54 U/L Final  . Alkaline Phosphatase 09/21/2016 63  38 - 126 U/L Final  . Total Bilirubin 09/21/2016 0.7  0.3 - 1.2 mg/dL Final  . GFR calc non Af Amer 09/21/2016 57* >60 mL/min Final  . GFR calc Af Amer 09/21/2016 >60  >60 mL/min Final   Comment: (NOTE) The eGFR has been calculated using the CKD EPI equation. This calculation has not been validated in all clinical situations. eGFR's persistently <60 mL/min signify possible Chronic Kidney Disease.   . Anion gap 09/21/2016 7  5 - 15 Final  . CEA 09/22/2016 2.4  0.0 - 4.7 ng/mL Final   Comment: (NOTE)       Roche ECLIA methodology       Nonsmokers  <3.9                                     Smokers     <5.6 Performed At: [facility] [address] [place], [place] [id number] Lindon Romp MD DV:[id number]     Assessment:  Kelly Krause is a 75 y.o. female with a history of stage IV colon cancer (1999) and stage IB esophageal cancer (2015).  She has a history of metastatic colon cancer 18 years ago.   She underwent  right lobectomy in [place], [place].  Pathology revealed metastatic colon cancer (no records available).  She underwent colonoscopy  which revealed a lesion in the descending colon.  She describes "a year of weekly chemotherapy".  She has had no evidence of recurrence.  Last colonoscopy was 4 years ago.   She was diagnosed with T1bN0M0 squamous cell carcinoma of the esophagus in 2015.  She presented with dysphagia.  EGD on 03/04/2014 revealed squamous cell carcinoma of the esophagus at 29-30 cm.  PET scan on 03/24/2014 revealed focal hypermetabolism in the distal esophagus, compatible with the patient's known esophageal neoplasm.  There was no evidence for hypermetabolic metastatic disease.   She received concurrent radiation and chemotherapy x 5 weeks beginning 03/2014.  Treatment completed 05/2014.  She declined surgical evaluation.    EGD on 04/21/2015 by Dr. Gustavo Lah at [facility] revealed evidence of previous XRT in the esophagus without stenosis or narrowing.  Esophageal pathology revealed high grade dysplasia at 23 cm, high grade dysplasia at 29-30 cm, and low grade dysplasia at 35 cm.    Upper endoscopy by Dr. Renford Dills at [facility] on 05/17/2016 revealed esophageal stenosis (dilated), a few non-bleeding erosions in the middle third of the esophagus (biopsied).  Z-line irregular, 35 cm from the incisors.  Chromoscopy was  performed in the entire esophagus.  Destruction of remaining portion of lesion in the middle third of the esophagus with argon plasma coagulation (APC) was performed.  Upper endoscopy by Dr. Renford Dills at [facility] on 07/19/2016 revealed benign-appearing esophageal stenosis.  There were a few non-bleeding erosions in the middle third of the esophagus.  There was nodular heterogeneously stained areas in the esophagus (biopsied) and treated with APC.  Symptomatically, she notes reflux, esophageal spasms, and dysphagia.  Exam is unremarkable.  Plan: 1.  Discuss entire medical history,  diagnosis and management of colon cancer and esophageal cancer.  Discuss recent endoscopies with dysplasia (high and low grade) s/p ablation. 2.  Labs today:  CBC with diff, CMP, CEA. 3.  Chest, abdomen, and pelvic CT scan on 10/10/2016. 4.  Schedule f/u endoscopy with Dr. Renford Dills [facility])  5.  RTC in 6 months for MD assessment and labs (CBC with diff, CMP).   Lequita Asal, MD  09/21/2016

## 2016-09-22 LAB — CEA: CEA: 2.4 ng/mL (ref 0.0–4.7)

## 2016-09-29 ENCOUNTER — Other Ambulatory Visit: Payer: Self-pay | Admitting: Family Medicine

## 2016-09-29 DIAGNOSIS — I10 Essential (primary) hypertension: Secondary | ICD-10-CM

## 2016-10-06 ENCOUNTER — Encounter: Payer: Self-pay | Admitting: Hematology and Oncology

## 2016-10-10 ENCOUNTER — Ambulatory Visit
Admission: RE | Admit: 2016-10-10 | Discharge: 2016-10-10 | Disposition: A | Discharge status: Home / Self Care | Payer: Medicare HMO | Source: Ambulatory Visit | Attending: Hematology and Oncology | Admitting: Hematology and Oncology

## 2016-10-10 DIAGNOSIS — R918 Other nonspecific abnormal finding of lung field: Secondary | ICD-10-CM | POA: Insufficient documentation

## 2016-10-10 DIAGNOSIS — C159 Malignant neoplasm of esophagus, unspecified: Secondary | ICD-10-CM

## 2016-10-10 DIAGNOSIS — I2510 Atherosclerotic heart disease of native coronary artery without angina pectoris: Secondary | ICD-10-CM | POA: Diagnosis not present

## 2016-10-10 DIAGNOSIS — I700 Atherosclerosis of aorta: Secondary | ICD-10-CM | POA: Diagnosis not present

## 2016-10-10 MED ORDER — IOPAMIDOL (ISOVUE-370) INJECTION 76%
125.0000 mL | Freq: Once | INTRAVENOUS | Status: AC | PRN
  Administered 2016-10-10: 125 mL via INTRAVENOUS

## 2016-10-11 ENCOUNTER — Other Ambulatory Visit: Payer: Self-pay | Admitting: Family Medicine

## 2016-10-11 DIAGNOSIS — I10 Essential (primary) hypertension: Secondary | ICD-10-CM | POA: Diagnosis not present

## 2016-10-11 DIAGNOSIS — K209 Esophagitis, unspecified: Secondary | ICD-10-CM | POA: Diagnosis not present

## 2016-10-11 DIAGNOSIS — I4891 Unspecified atrial fibrillation: Secondary | ICD-10-CM | POA: Diagnosis not present

## 2016-10-11 DIAGNOSIS — K31811 Angiodysplasia of stomach and duodenum with bleeding: Secondary | ICD-10-CM | POA: Diagnosis not present

## 2016-10-11 DIAGNOSIS — E785 Hyperlipidemia, unspecified: Secondary | ICD-10-CM | POA: Diagnosis not present

## 2016-10-11 DIAGNOSIS — Z85038 Personal history of other malignant neoplasm of large intestine: Secondary | ICD-10-CM | POA: Diagnosis not present

## 2016-10-11 DIAGNOSIS — Z8673 Personal history of transient ischemic attack (TIA), and cerebral infarction without residual deficits: Secondary | ICD-10-CM | POA: Diagnosis not present

## 2016-10-11 DIAGNOSIS — C159 Malignant neoplasm of esophagus, unspecified: Secondary | ICD-10-CM | POA: Diagnosis not present

## 2016-10-11 DIAGNOSIS — I499 Cardiac arrhythmia, unspecified: Secondary | ICD-10-CM

## 2016-10-11 DIAGNOSIS — K222 Esophageal obstruction: Secondary | ICD-10-CM | POA: Diagnosis not present

## 2016-10-11 DIAGNOSIS — K449 Diaphragmatic hernia without obstruction or gangrene: Secondary | ICD-10-CM | POA: Diagnosis not present

## 2016-10-11 DIAGNOSIS — K2210 Ulcer of esophagus without bleeding: Secondary | ICD-10-CM | POA: Diagnosis not present

## 2016-10-18 DIAGNOSIS — L814 Other melanin hyperpigmentation: Secondary | ICD-10-CM | POA: Diagnosis not present

## 2016-10-18 DIAGNOSIS — L821 Other seborrheic keratosis: Secondary | ICD-10-CM | POA: Diagnosis not present

## 2016-11-01 ENCOUNTER — Encounter: Payer: Self-pay | Admitting: Family Medicine

## 2016-11-01 ENCOUNTER — Ambulatory Visit (INDEPENDENT_AMBULATORY_CARE_PROVIDER_SITE_OTHER): Payer: Medicare HMO | Admitting: Family Medicine

## 2016-11-01 VITALS — BP 124/62 | HR 62 | Ht 68.0 in | Wt 165.0 lb

## 2016-11-01 DIAGNOSIS — F329 Major depressive disorder, single episode, unspecified: Secondary | ICD-10-CM | POA: Diagnosis not present

## 2016-11-01 DIAGNOSIS — K2100 Gastro-esophageal reflux disease with esophagitis, without bleeding: Secondary | ICD-10-CM

## 2016-11-01 DIAGNOSIS — I10 Essential (primary) hypertension: Secondary | ICD-10-CM

## 2016-11-01 DIAGNOSIS — J301 Allergic rhinitis due to pollen: Secondary | ICD-10-CM

## 2016-11-01 DIAGNOSIS — E784 Other hyperlipidemia: Secondary | ICD-10-CM | POA: Diagnosis not present

## 2016-11-01 DIAGNOSIS — F3341 Major depressive disorder, recurrent, in partial remission: Secondary | ICD-10-CM

## 2016-11-01 DIAGNOSIS — E7849 Other hyperlipidemia: Secondary | ICD-10-CM

## 2016-11-01 DIAGNOSIS — K219 Gastro-esophageal reflux disease without esophagitis: Secondary | ICD-10-CM | POA: Diagnosis not present

## 2016-11-01 DIAGNOSIS — R69 Illness, unspecified: Secondary | ICD-10-CM | POA: Diagnosis not present

## 2016-11-01 MED ORDER — SIMVASTATIN 40 MG PO TABS: 40.0000 mg | ORAL_TABLET | Freq: Every day | ORAL | 2 refills | Status: DC

## 2016-11-01 MED ORDER — FLUTICASONE PROPIONATE 50 MCG/ACT NA SUSP: NASAL | 11 refills | Status: AC

## 2016-11-01 MED ORDER — PANTOPRAZOLE SODIUM 40 MG PO TBEC: 40.0000 mg | DELAYED_RELEASE_TABLET | Freq: Two times a day (BID) | ORAL | 2 refills | Status: DC

## 2016-11-01 MED ORDER — BUPROPION HCL ER (SR) 150 MG PO TB12: 150.0000 mg | ORAL_TABLET | Freq: Two times a day (BID) | ORAL | 2 refills | Status: DC

## 2016-11-01 MED ORDER — SERTRALINE HCL 25 MG PO TABS: 25.0000 mg | ORAL_TABLET | Freq: Every day | ORAL | 2 refills | Status: DC

## 2016-11-01 MED ORDER — HYDROCHLOROTHIAZIDE 25 MG PO TABS: 25.0000 mg | ORAL_TABLET | Freq: Every day | ORAL | 2 refills | Status: DC

## 2016-11-01 MED ORDER — CETIRIZINE HCL 10 MG PO TABS: 10.0000 mg | ORAL_TABLET | Freq: Two times a day (BID) | ORAL | 3 refills | Status: DC

## 2016-11-01 MED ORDER — AMLODIPINE BESYLATE 10 MG PO TABS: ORAL_TABLET | ORAL | 2 refills | Status: DC

## 2016-11-01 MED ORDER — RAMIPRIL 10 MG PO CAPS: ORAL_CAPSULE | ORAL | 2 refills | Status: DC

## 2016-11-01 NOTE — Progress Notes (Signed)
Name: Kelly Krause   MRN: [id number]    DOB: [birth year]   Date:11/01/2016       Progress Note  Subjective  Chief Complaint  Chief Complaint  Patient presents with  . Allergic Rhinitis   . Gastroesophageal Reflux  . Depression  . Hypertension  . Hyperlipidemia    Gastroesophageal Reflux  She complains of dysphagia. She reports no abdominal pain, no belching, no chest pain, no choking, no coughing, no early satiety, no globus sensation, no heartburn, no hoarse voice, no nausea, no sore throat, no stridor, no tooth decay, no water brash or no wheezing. This is a chronic problem. The current episode started more than 1 year ago. The problem occurs occasionally. The problem has been waxing and waning. The symptoms are aggravated by certain foods. Pertinent negatives include no anemia, fatigue, melena, muscle weakness, orthopnea or weight loss. There are no known risk factors. She has tried a PPI for the symptoms. The treatment provided moderate relief. Past procedures do not include an abdominal ultrasound, an EGD, esophageal manometry, esophageal pH monitoring, H. pylori antibody titer or a UGI.  Depression         The patient presents with no depression.  This is a chronic problem.  The current episode started more than 1 year ago.   The onset quality is gradual.   The problem occurs daily.  The problem has been waxing and waning since onset.  Associated symptoms include no decreased concentration, no fatigue, no helplessness, no hopelessness, does not have insomnia, not irritable, no restlessness, no decreased interest, no appetite change, no body aches, no myalgias, no headaches, no indigestion, not sad and no suicidal ideas.     The symptoms are aggravated by nothing.  Past treatments include SSRIs - Selective serotonin reuptake inhibitors.  Compliance with treatment is good.  Previous treatment provided mild relief.  Past medical history includes chronic illness.     Pertinent negatives include  no chronic fatigue syndrome, no chronic pain, no fibromyalgia, no hypothyroidism, no physical disability, no terminal illness, no recent psychiatric admission, no anxiety, no eating disorder, no depression, no mental health disorder, no obsessive-compulsive disorder, no post-traumatic stress disorder and no schizophrenia. Hypertension  This is a chronic problem. The current episode started more than 1 year ago. The problem has been waxing and waning since onset. The problem is controlled. Pertinent negatives include no anxiety, blurred vision, chest pain, headaches, malaise/fatigue, neck pain, orthopnea, palpitations, peripheral edema, PND, shortness of breath or sweats. There are no associated agents to hypertension. Risk factors for coronary artery disease include dyslipidemia and post-menopausal state. Past treatments include ACE inhibitors, beta blockers and diuretics. There is no history of angina, kidney disease, CAD/MI, CVA, heart failure, left ventricular hypertrophy, PVD, renovascular disease or retinopathy. There is no history of chronic renal disease or a hypertension causing med.  Hyperlipidemia  This is a chronic problem. The problem is controlled. Recent lipid tests were reviewed and are normal. She has no history of chronic renal disease, diabetes, hypothyroidism, liver disease, obesity or nephrotic syndrome. Pertinent negatives include no chest pain, focal weakness, myalgias or shortness of breath. She is currently on no antihyperlipidemic treatment.    No problem-specific Assessment & Plan notes found for this encounter.   Past Medical History:  Diagnosis Date  . Allergy   . Cancer ([place])    esophagus/chemo and rad  . Colon cancer ([place])    chemo/rad 15 yrs  . GERD (gastroesophageal reflux disease)   . Hyperlipidemia   .  Hypertension   . Major depressive disorder, recurrent episode, moderate ([place])   . Persistent disorder of initiating or maintaining sleep     Past Surgical  History:  Procedure Laterality Date  . ABDOMINAL HYSTERECTOMY    . BUNIONECTOMY    . ESOPHAGOGASTRODUODENOSCOPY N/A 04/21/2015   Procedure: ESOPHAGOGASTRODUODENOSCOPY (EGD);  Surgeon: Lollie Sails, MD;  Location: [facility] ENDOSCOPY;  Service: Endoscopy;  Laterality: N/A;  . Esophgeal cancer    . LOBECTOMY Right   . PARTIAL HYSTERECTOMY    . SINUSOTOMY      Family History  Problem Relation Age of Onset  . Stroke Mother   . Depression Mother   . Breast cancer Mother 34  . Breast cancer Maternal Aunt   . Alcohol abuse Father   . Stroke Father   . Hypertension Father     Social History   Social History  . Marital status: Married    Spouse name: N/A  . Number of children: N/A  . Years of education: N/A   Occupational History  . Not on file.   Social History Main Topics  . Smoking status: Former Research scientist (life sciences)  . Smokeless tobacco: Never Used  . Alcohol use Not on file  . Drug use: Unknown  . Sexual activity: Not Currently   Other Topics Concern  . Not on file   Social History Narrative  . No narrative on file    Allergies  Allergen Reactions  . Amoxicillin-Pot Clavulanate Itching and Nausea Only  . Codeine Nausea Only and Other (See Comments)    GI Upset headaches  . Demerol [Meperidine] Itching, Nausea Only and Other (See Comments)    GI Upset, Headaches   . Latex Hives and Itching  . Morphine And Related Itching, Nausea Only and Other (See Comments)    headaches  . Oxycodone-Acetaminophen Other (See Comments)  . Penicillins Diarrhea  . Strawberry Extract   . Tramadol Other (See Comments)     GI Upset  . Buprenorphine Hcl Itching and Nausea Only    Other reaction(s): Other (See Comments) headaches  . Fentanyl Itching and Nausea And Vomiting  . Tapentadol Rash     Review of Systems  Constitutional: Negative for appetite change, chills, fatigue, fever, malaise/fatigue and weight loss.  HENT: Negative for ear discharge, ear pain, hoarse voice and sore  throat.   Eyes: Negative for blurred vision.  Respiratory: Negative for cough, sputum production, choking, shortness of breath and wheezing.   Cardiovascular: Negative for chest pain, palpitations, orthopnea, leg swelling and PND.  Gastrointestinal: Positive for dysphagia. Negative for abdominal pain, blood in stool, constipation, diarrhea, heartburn, melena and nausea.  Genitourinary: Negative for dysuria, frequency, hematuria and urgency.  Musculoskeletal: Negative for back pain, joint pain, myalgias, muscle weakness and neck pain.  Skin: Negative for rash.  Neurological: Negative for dizziness, tingling, sensory change, focal weakness and headaches.  Endo/Heme/Allergies: Negative for environmental allergies and polydipsia. Does not bruise/bleed easily.  Psychiatric/Behavioral: Positive for depression. Negative for decreased concentration and suicidal ideas. The patient is not nervous/anxious and does not have insomnia.      Objective  Vitals:   11/01/16 1013  BP: 124/62  Pulse: 62  Weight: 165 lb (74.8 kg)  Height: '5\' 8"'$  (1.727 m)    Physical Exam  Constitutional: She is well-developed, well-nourished, and in no distress. She is not irritable. No distress.  HENT:  Head: Normocephalic and atraumatic.  Right Ear: External ear normal.  Left Ear: External ear normal.  Nose: Nose normal.  Mouth/Throat: Oropharynx is clear and moist.  Eyes: Conjunctivae and EOM are normal. Pupils are equal, round, and reactive to light. Right eye exhibits no discharge. Left eye exhibits no discharge.  Neck: Normal range of motion. Neck supple. No JVD present. No thyromegaly present.  Cardiovascular: Normal rate, regular rhythm, normal heart sounds and intact distal pulses.  Exam reveals no gallop and no friction rub.   No murmur heard. Pulmonary/Chest: Effort normal and breath sounds normal. She has no wheezes. She has no rales.  Abdominal: Soft. Bowel sounds are normal. She exhibits no mass. There  is no tenderness. There is no guarding.  Musculoskeletal: Normal range of motion. She exhibits no edema.  Lymphadenopathy:    She has no cervical adenopathy.  Neurological: She is alert.  Skin: Skin is warm and dry. She is not diaphoretic.  Psychiatric: Mood and affect normal.  Nursing note and vitals reviewed.     Assessment & Plan  Problem List Items Addressed This Visit      Cardiovascular and Mediastinum   Essential (primary) hypertension - Primary   Relevant Medications   hydrochlorothiazide (HYDRODIURIL) 25 MG tablet   ramipril (ALTACE) 10 MG capsule   amLODipine (NORVASC) 10 MG tablet   simvastatin (ZOCOR) 40 MG tablet   BP (high blood pressure)   Relevant Medications   hydrochlorothiazide (HYDRODIURIL) 25 MG tablet   ramipril (ALTACE) 10 MG capsule   amLODipine (NORVASC) 10 MG tablet   simvastatin (ZOCOR) 40 MG tablet     Respiratory   Allergic rhinitis   Relevant Medications   cetirizine (ZYRTEC) 10 MG tablet   fluticasone (FLONASE) 50 MCG/ACT nasal spray     Digestive   Acid reflux   Relevant Medications   pantoprazole (PROTONIX) 40 MG tablet     Other   Familial multiple lipoprotein-type hyperlipidemia   Relevant Medications   hydrochlorothiazide (HYDRODIURIL) 25 MG tablet   ramipril (ALTACE) 10 MG capsule   amLODipine (NORVASC) 10 MG tablet   simvastatin (ZOCOR) 40 MG tablet   Clinical depression   Relevant Medications   buPROPion (WELLBUTRIN SR) 150 MG 12 hr tablet   sertraline (ZOLOFT) 25 MG tablet   hydrochlorothiazide (HYDRODIURIL) 25 MG tablet        Dr. [facility]  11/01/16

## 2016-11-10 ENCOUNTER — Other Ambulatory Visit: Payer: Self-pay

## 2016-11-22 ENCOUNTER — Other Ambulatory Visit: Payer: Self-pay

## 2016-11-22 DIAGNOSIS — I499 Cardiac arrhythmia, unspecified: Secondary | ICD-10-CM

## 2016-11-22 MED ORDER — SOTALOL HCL 80 MG PO TABS: 80.0000 mg | ORAL_TABLET | Freq: Every day | ORAL | 5 refills | Status: DC

## 2017-02-04 ENCOUNTER — Other Ambulatory Visit: Payer: Self-pay | Admitting: Family Medicine

## 2017-02-23 ENCOUNTER — Ambulatory Visit: Payer: Self-pay | Admitting: Family Medicine

## 2017-03-02 ENCOUNTER — Other Ambulatory Visit: Payer: Self-pay | Admitting: Family Medicine

## 2017-03-02 DIAGNOSIS — Z1231 Encounter for screening mammogram for malignant neoplasm of breast: Secondary | ICD-10-CM

## 2017-03-09 DIAGNOSIS — H1013 Acute atopic conjunctivitis, bilateral: Secondary | ICD-10-CM | POA: Diagnosis not present

## 2017-03-13 DIAGNOSIS — M79672 Pain in left foot: Secondary | ICD-10-CM | POA: Diagnosis not present

## 2017-03-13 DIAGNOSIS — M2042 Other hammer toe(s) (acquired), left foot: Secondary | ICD-10-CM | POA: Diagnosis not present

## 2017-03-13 DIAGNOSIS — M2012 Hallux valgus (acquired), left foot: Secondary | ICD-10-CM | POA: Diagnosis not present

## 2017-03-20 DIAGNOSIS — Z7689 Persons encountering health services in other specified circumstances: Secondary | ICD-10-CM | POA: Diagnosis not present

## 2017-03-20 DIAGNOSIS — M542 Cervicalgia: Secondary | ICD-10-CM | POA: Diagnosis not present

## 2017-03-20 DIAGNOSIS — M5432 Sciatica, left side: Secondary | ICD-10-CM | POA: Diagnosis not present

## 2017-03-20 DIAGNOSIS — M47816 Spondylosis without myelopathy or radiculopathy, lumbar region: Secondary | ICD-10-CM | POA: Diagnosis not present

## 2017-03-22 ENCOUNTER — Inpatient Hospital Stay (HOSPITAL_BASED_OUTPATIENT_CLINIC_OR_DEPARTMENT_OTHER): Payer: Medicare HMO | Admitting: Hematology and Oncology

## 2017-03-22 ENCOUNTER — Encounter: Payer: Self-pay | Admitting: Hematology and Oncology

## 2017-03-22 ENCOUNTER — Inpatient Hospital Stay: Payer: Medicare HMO | Attending: Hematology and Oncology

## 2017-03-22 ENCOUNTER — Inpatient Hospital Stay: Payer: Medicare HMO

## 2017-03-22 VITALS — BP 123/64 | HR 60 | Temp 96.7°F | Resp 18 | Wt 168.0 lb

## 2017-03-22 DIAGNOSIS — E785 Hyperlipidemia, unspecified: Secondary | ICD-10-CM | POA: Insufficient documentation

## 2017-03-22 DIAGNOSIS — T502X5A Adverse effect of carbonic-anhydrase inhibitors, benzothiadiazides and other diuretics, initial encounter: Secondary | ICD-10-CM | POA: Diagnosis not present

## 2017-03-22 DIAGNOSIS — F329 Major depressive disorder, single episode, unspecified: Secondary | ICD-10-CM | POA: Insufficient documentation

## 2017-03-22 DIAGNOSIS — Z923 Personal history of irradiation: Secondary | ICD-10-CM | POA: Insufficient documentation

## 2017-03-22 DIAGNOSIS — Z85038 Personal history of other malignant neoplasm of large intestine: Secondary | ICD-10-CM

## 2017-03-22 DIAGNOSIS — Z87891 Personal history of nicotine dependence: Secondary | ICD-10-CM

## 2017-03-22 DIAGNOSIS — K449 Diaphragmatic hernia without obstruction or gangrene: Secondary | ICD-10-CM | POA: Diagnosis not present

## 2017-03-22 DIAGNOSIS — Z79899 Other long term (current) drug therapy: Secondary | ICD-10-CM

## 2017-03-22 DIAGNOSIS — K31811 Angiodysplasia of stomach and duodenum with bleeding: Secondary | ICD-10-CM | POA: Insufficient documentation

## 2017-03-22 DIAGNOSIS — C159 Malignant neoplasm of esophagus, unspecified: Secondary | ICD-10-CM

## 2017-03-22 DIAGNOSIS — Z95828 Presence of other vascular implants and grafts: Secondary | ICD-10-CM

## 2017-03-22 DIAGNOSIS — Z452 Encounter for adjustment and management of vascular access device: Secondary | ICD-10-CM | POA: Diagnosis not present

## 2017-03-22 DIAGNOSIS — E876 Hypokalemia: Secondary | ICD-10-CM | POA: Diagnosis not present

## 2017-03-22 DIAGNOSIS — K219 Gastro-esophageal reflux disease without esophagitis: Secondary | ICD-10-CM | POA: Insufficient documentation

## 2017-03-22 DIAGNOSIS — I10 Essential (primary) hypertension: Secondary | ICD-10-CM

## 2017-03-22 DIAGNOSIS — Z9221 Personal history of antineoplastic chemotherapy: Secondary | ICD-10-CM

## 2017-03-22 DIAGNOSIS — Z8501 Personal history of malignant neoplasm of esophagus: Secondary | ICD-10-CM | POA: Diagnosis not present

## 2017-03-22 DIAGNOSIS — C186 Malignant neoplasm of descending colon: Secondary | ICD-10-CM

## 2017-03-22 DIAGNOSIS — R69 Illness, unspecified: Secondary | ICD-10-CM | POA: Diagnosis not present

## 2017-03-22 LAB — CBC WITH DIFFERENTIAL/PLATELET
Basophils Absolute: 0.1 10*3/uL (ref 0–0.1)
Basophils Relative: 1 %
Eosinophils Absolute: 0.4 10*3/uL (ref 0–0.7)
Eosinophils Relative: 5 %
HCT: 35.8 % (ref 35.0–47.0)
Hemoglobin: 11.7 g/dL — ABNORMAL LOW (ref 12.0–16.0)
Lymphocytes Relative: 29 %
Lymphs Abs: 2.7 10*3/uL (ref 1.0–3.6)
MCH: 28.9 pg (ref 26.0–34.0)
MCHC: 32.8 g/dL (ref 32.0–36.0)
MCV: 88.3 fL (ref 80.0–100.0)
Monocytes Absolute: 0.8 10*3/uL (ref 0.2–0.9)
Monocytes Relative: 8 %
Neutro Abs: 5.2 10*3/uL (ref 1.4–6.5)
Neutrophils Relative %: 57 %
Platelets: 306 10*3/uL (ref 150–440)
RBC: 4.05 MIL/uL (ref 3.80–5.20)
RDW: 14.5 % (ref 11.5–14.5)
WBC: 9.2 10*3/uL (ref 3.6–11.0)

## 2017-03-22 LAB — COMPREHENSIVE METABOLIC PANEL
ALT: 16 U/L (ref 14–54)
AST: 24 U/L (ref 15–41)
Albumin: 4.5 g/dL (ref 3.5–5.0)
Alkaline Phosphatase: 71 U/L (ref 38–126)
Anion gap: 7 (ref 5–15)
BUN: 32 mg/dL — ABNORMAL HIGH (ref 6–20)
CO2: 30 mmol/L (ref 22–32)
Calcium: 9.1 mg/dL (ref 8.9–10.3)
Chloride: 99 mmol/L — ABNORMAL LOW (ref 101–111)
Creatinine, Ser: 1.27 mg/dL — ABNORMAL HIGH (ref 0.44–1.00)
GFR calc Af Amer: 46 mL/min — ABNORMAL LOW (ref >60)
GFR calc non Af Amer: 40 mL/min — ABNORMAL LOW (ref >60)
Glucose, Bld: 139 mg/dL — ABNORMAL HIGH (ref 65–99)
Potassium: 3.2 mmol/L — ABNORMAL LOW (ref 3.5–5.1)
Sodium: 136 mmol/L (ref 135–145)
Total Bilirubin: 0.5 mg/dL (ref 0.3–1.2)
Total Protein: 7.7 g/dL (ref 6.5–8.1)

## 2017-03-22 MED ORDER — POTASSIUM CHLORIDE ER 10 MEQ PO TBCR: 10.0000 meq | EXTENDED_RELEASE_TABLET | Freq: Two times a day (BID) | ORAL | 0 refills | Status: DC

## 2017-03-22 MED ORDER — SODIUM CHLORIDE 0.9% FLUSH
10.0000 mL | INTRAVENOUS | Status: AC | PRN
  Administered 2017-03-22: 10 mL via INTRAVENOUS
  Filled 2017-03-22: qty 10

## 2017-03-22 MED ORDER — HEPARIN SOD (PORK) LOCK FLUSH 100 UNIT/ML IV SOLN
500.0000 [IU] | Freq: Once | INTRAVENOUS | Status: AC
  Administered 2017-03-22: 500 [IU] via INTRAVENOUS

## 2017-03-22 NOTE — Progress Notes (Signed)
Patient states she is having problems swallowing (solids and liquids).  Having problems with sciatic nerve pain as well as pain in her neck.

## 2017-03-22 NOTE — Addendum Note (Signed)
Addended by: Levada Schilling on: 03/22/2017 12:45 PM   Modules accepted: Orders, SmartSet

## 2017-03-22 NOTE — Progress Notes (Signed)
[facility] day:  03/22/2017   Chief Complaint: Kelly Krause is a 76 y.o. female with a history of stage IV colon cancer (1999) and stage IB esophageal cancer (2015) who is seen for 6 month assessment.  HPI:  The patient was last seen in the medical oncology clinic on 09/21/2016.  At that time, she was seen for initial assessment by me.  She noted reflux, esophageal spasms, and dysphagia.  Exam was unremarkable.  Hematocrit was 36.2 with a hemoglobin of 11.9.  CEA was normal.  Chest, abdomen, and pelvic CT scan on 10/10/2016 revealed no evidence of metastatic disease.  There was stable partially calcified right sided pleural thickening.  She underwent upper endoscopy on 10/11/2016 by Dr. Renford Dills.  She had benign appearing esophageal stenosis. There were a few nonbleeding erosions in the middle third of the esophagus. Chromoscopy of the entire esophagus revealed heterogeneously stained areas.  There was a 3 cm hiatal hernia. There was a single bleeding angiodysplastic lesion in the duodenum treated by argon plasma coagulation.  She was started on Carafate 10 ml by by mouth 4 times a day for 2 weeks. She was to continue her pantoprazole.  During the interim, she has felt "pretty good". For about a week she has been uncomfortable swallowing. She has not had a upper endoscopy since 09/2016. Previously she had upper endoscopies every 5-6 weeks.  She also notes neck problems due to disc disease. She has sciatica.   Past Medical History:  Diagnosis Date  . Allergy   . Cancer ([place])    esophagus/chemo and rad  . Colon cancer ([place])    chemo/rad 15 yrs  . GERD (gastroesophageal reflux disease)   . Hyperlipidemia   . Hypertension   . Major depressive disorder, recurrent episode, moderate ([place])   . Persistent disorder of initiating or maintaining sleep     Past Surgical History:  Procedure Laterality Date  . ABDOMINAL HYSTERECTOMY    .  BUNIONECTOMY    . ESOPHAGOGASTRODUODENOSCOPY N/A 04/21/2015   Procedure: ESOPHAGOGASTRODUODENOSCOPY (EGD);  Surgeon: Lollie Sails, MD;  Location: [facility] ENDOSCOPY;  Service: Endoscopy;  Laterality: N/A;  . Esophgeal cancer    . LOBECTOMY Right   . PARTIAL HYSTERECTOMY    . SINUSOTOMY      Family History  Problem Relation Age of Onset  . Stroke Mother   . Depression Mother   . Breast cancer Mother 44  . Breast cancer Maternal Aunt   . Alcohol abuse Father   . Stroke Father   . Hypertension Father     Social History:  reports that she has quit smoking. She has never used smokeless tobacco. Her alcohol and drug histories are not on file.  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  She goes to [facility] from mid December to April 1st.  She lives in [place].  The patient is alone today.  Allergies:  Allergies  Allergen Reactions  . Amoxicillin-Pot Clavulanate Itching and Nausea Only  . Codeine Nausea Only and Other (See Comments)    GI Upset headaches  . Demerol [Meperidine] Itching, Nausea Only and Other (See Comments)    GI Upset, Headaches   . Latex Hives and Itching  . Morphine And Related Itching, Nausea Only and Other (See Comments)    headaches  . Oxycodone-Acetaminophen Other (See Comments)  . Penicillins Diarrhea  .  Strawberry Extract   . Tramadol Other (See Comments)     GI Upset  . Buprenorphine Hcl Itching and Nausea Only    Other reaction(s): Other (See Comments) headaches  . Fentanyl Itching and Nausea And Vomiting  . Tapentadol Rash    Current Medications: Current Outpatient Prescriptions  Medication Sig Dispense Refill  . amLODipine (NORVASC) 10 MG tablet TAKE ONE TABLET BY MOUTH ONCE DAILY-NEED  APPOINTMENT 90 tablet 2  . Biotin 300 MCG TABS Take 1 tablet by mouth daily.    Marland Kitchen buPROPion (WELLBUTRIN SR) 150 MG 12 hr tablet Take 1 tablet (150 mg total) by mouth 2 (two)  times daily. 180 tablet 2  . cetirizine (ZYRTEC) 10 MG tablet Take 1 tablet (10 mg total) by mouth 2 (two) times daily. otc 90 tablet 3  . Cholecalciferol (VITAMIN D-3) 1000 UNITS CAPS Take 1 capsule by mouth daily.    . diphenhydrAMINE (BENADRYL) 25 mg capsule Take 25 mg by mouth.    . docusate sodium (COLACE) 100 MG capsule Take 200 mg by mouth daily.    . fluticasone (FLONASE) 50 MCG/ACT nasal spray USE ONE SPRAY(S) IN EACH NOSTRIL ONCE DAILY 16 g 11  . fluticasone (FLONASE) 50 MCG/ACT nasal spray USE ONE SPRAY(S) IN EACH NOSTRIL ONCE DAILY 16 g 1  . hydrochlorothiazide (HYDRODIURIL) 25 MG tablet Take 1 tablet (25 mg total) by mouth daily. 90 tablet 2  . pantoprazole (PROTONIX) 40 MG tablet Take 1 tablet (40 mg total) by mouth 2 (two) times daily. 180 tablet 2  . predniSONE (DELTASONE) 10 MG tablet Taper:  6 tablets today, 5 tablets tomorrow, 4 tablets next day, 3 tablets next day, 2 tablets next day, 1 tablet on last day    . ramipril (ALTACE) 10 MG capsule TAKE ONE CAPSULE BY MOUTH ONCE DAILY. MUST SCHEDULE APPOINTMENT FOR NOVEMBER 90 capsule 2  . sertraline (ZOLOFT) 25 MG tablet Take 1 tablet (25 mg total) by mouth daily. 90 tablet 2  . simvastatin (ZOCOR) 40 MG tablet Take 1 tablet (40 mg total) by mouth daily. 90 tablet 2  . sotalol (BETAPACE) 80 MG tablet Take 1 tablet (80 mg total) by mouth daily. 60 tablet 5  . vitamin B-12 (CYANOCOBALAMIN) 1000 MCG tablet Take 1,000 mcg by mouth daily. otc    . sucralfate (CARAFATE) 1 GM/10ML suspension Take by mouth. Cancer Doc     No current facility-administered medications for this visit.    Facility-Administered Medications Ordered in Other Visits  Medication Dose Route Frequency Provider Last Rate Last Dose  . 0.9 %  sodium chloride infusion   Intravenous Continuous Evlyn Kanner, NP 999 mL/hr at 07/20/15 1146    . heparin lock flush 100 unit/mL  500 Units Intravenous Once Forest Gleason, MD      . sodium chloride 0.9 % injection 10 mL  10  mL Intravenous PRN Forest Gleason, MD   10 mL at 07/20/15 1147    Review of Systems:  GENERAL:  Feels "pretty good".  No fevers or sweats.  Weight down 1 pound since last visit. PERFORMANCE STATUS (ECOG):  1 HEENT:  No visual changes, runny nose, sore throat, mouth sores or tenderness. Lungs: No shortness of breath or cough.  No hemoptysis. Cardiac:  No chest pain, palpitations, orthopnea, or PND. GI:  Uncomfortable swallowing.  Reflux.  No nausea, vomiting, diarrhea, constipation, melena or hematochezia. GU:  No urgency, frequency, dysuria, or hematuria. Musculoskeletal:  Neck problems (chronic).  No joint pain.  No muscle  tenderness. Extremities:  No pain or swelling. Skin:  No rashes or skin changes. Neuro:  Sciatic nerve issues.  No headache, numbness or weakness, balance or coordination issues. Endocrine:  No diabetes, thyroid issues, hot flashes or night sweats. Psych:  No mood changes, depression or anxiety. Pain:  No focal pain. Review of systems:  All other systems reviewed and found to be negative.  Physical Exam: Blood pressure 123/64, pulse 60, temperature (!) 96.7 F (35.9 C), temperature source Tympanic, resp. rate 18, weight 167 lb 15.9 oz (76.2 kg). GENERAL:  Well developed, well nourished, woman sitting comfortably in the exam room in no acute distress. MENTAL STATUS:  Alert and oriented to person, place and time. HEAD:  Short styled brown hair.  Normocephalic, atraumatic, face symmetric, no Cushingoid features. EYES:  Green rimmed glasses.  Blue eyes.  Pupils equal round and reactive to light and accomodation.  No conjunctivitis or scleral icterus. ENT:  Oropharynx clear without lesion.  Tongue normal. Mucous membranes moist.  RESPIRATORY:  Clear to auscultation without rales, wheezes or rhonchi. CARDIOVASCULAR:  Regular rate and rhythm without murmur, rub or gallop. ABDOMEN:  Soft, non-tender, with active bowel sounds, and no hepatosplenomegaly.  No masses. SKIN:  No  rashes, ulcers or lesions. EXTREMITIES: Trace lower extremity edema.  No skin discoloration or tenderness.  No palpable cords. LYMPH NODES: No palpable cervical, supraclavicular, axillary or inguinal adenopathy  NEUROLOGICAL: Unremarkable. PSYCH:  Appropriate.   Appointment on 03/22/2017  Component Date Value Ref Range Status  . WBC 03/22/2017 9.2  3.6 - 11.0 K/uL Final  . RBC 03/22/2017 4.05  3.80 - 5.20 MIL/uL Final  . Hemoglobin 03/22/2017 11.7* 12.0 - 16.0 g/dL Final  . HCT 03/22/2017 35.8  35.0 - 47.0 % Final  . MCV 03/22/2017 88.3  80.0 - 100.0 fL Final  . MCH 03/22/2017 28.9  26.0 - 34.0 pg Final  . MCHC 03/22/2017 32.8  32.0 - 36.0 g/dL Final  . RDW 03/22/2017 14.5  11.5 - 14.5 % Final  . Platelets 03/22/2017 306  150 - 440 K/uL Final  . Neutrophils Relative % 03/22/2017 57  % Final  . Neutro Abs 03/22/2017 5.2  1.4 - 6.5 K/uL Final  . Lymphocytes Relative 03/22/2017 29  % Final  . Lymphs Abs 03/22/2017 2.7  1.0 - 3.6 K/uL Final  . Monocytes Relative 03/22/2017 8  % Final  . Monocytes Absolute 03/22/2017 0.8  0.2 - 0.9 K/uL Final  . Eosinophils Relative 03/22/2017 5  % Final  . Eosinophils Absolute 03/22/2017 0.4  0 - 0.7 K/uL Final  . Basophils Relative 03/22/2017 1  % Final  . Basophils Absolute 03/22/2017 0.1  0 - 0.1 K/uL Final  . Sodium 03/22/2017 136  135 - 145 mmol/L Final  . Potassium 03/22/2017 3.2* 3.5 - 5.1 mmol/L Final  . Chloride 03/22/2017 99* 101 - 111 mmol/L Final  . CO2 03/22/2017 30  22 - 32 mmol/L Final  . Glucose, Bld 03/22/2017 139* 65 - 99 mg/dL Final  . BUN 03/22/2017 32* 6 - 20 mg/dL Final  . Creatinine, Ser 03/22/2017 1.27* 0.44 - 1.00 mg/dL Final  . Calcium 03/22/2017 9.1  8.9 - 10.3 mg/dL Final  . Total Protein 03/22/2017 7.7  6.5 - 8.1 g/dL Final  . Albumin 03/22/2017 4.5  3.5 - 5.0 g/dL Final  . AST 03/22/2017 24  15 - 41 U/L Final  . ALT 03/22/2017 16  14 - 54 U/L Final  . Alkaline Phosphatase 03/22/2017 71  38 -  126 U/L Final  . Total  Bilirubin 03/22/2017 0.5  0.3 - 1.2 mg/dL Final  . GFR calc non Af Amer 03/22/2017 40* >60 mL/min Final  . GFR calc Af Amer 03/22/2017 46* >60 mL/min Final   Comment: (NOTE) The eGFR has been calculated using the CKD EPI equation. This calculation has not been validated in all clinical situations. eGFR's persistently <60 mL/min signify possible Chronic Kidney Disease.   . Anion gap 03/22/2017 7  5 - 15 Final    Assessment:  Kelly Krause is a 76 y.o. female with a history of stage IV colon cancer (1999) and stage IB esophageal cancer (2015).  She has a history of metastatic colon cancer 19 years ago.   She underwent right lobectomy in [place], [place].  Pathology revealed metastatic colon cancer (no records available).  She underwent colonoscopy  which revealed a lesion in the descending colon.  She describes "a year of weekly chemotherapy".  She has had no evidence of recurrence.  Last colonoscopy was 5 years ago.   CEA has been followed:  2.4 on 09/21/2016.  She was diagnosed with [id number] squamous cell carcinoma of the esophagus in 2015.  She presented with dysphagia.  EGD on 03/04/2014 revealed squamous cell carcinoma of the esophagus at 29-30 cm.  PET scan on 03/24/2014 revealed focal hypermetabolism in the distal esophagus, compatible with the patient's known esophageal neoplasm.  There was no evidence for hypermetabolic metastatic disease.   She received concurrent radiation and chemotherapy x 5 weeks beginning 03/2014.  Treatment completed 05/2014.  She declined surgical evaluation.    EGD on 04/21/2015 by Dr. Gustavo Lah at [facility] revealed evidence of previous XRT in the esophagus without stenosis or narrowing.  Esophageal pathology revealed high grade dysplasia at 23 cm, high grade dysplasia at 29-30 cm, and low grade dysplasia at 35 cm.    Upper endoscopy on 05/17/2016 revealed esophageal stenosis (dilated), a few non-bleeding erosions in the middle third of the esophagus (biopsied).   Z-line irregular, 35 cm from the incisors.  Chromoscopy was performed in the entire esophagus.  Destruction of remaining portion of lesion in the middle third of the esophagus with argon plasma coagulation (APC) was performed.  Upper endoscopy on 07/19/2016 revealed benign-appearing esophageal stenosis.  There were a few non-bleeding erosions in the middle third of the esophagus.  There was nodular heterogeneously stained areas in the esophagus (biopsied) and treated with APC.  Upper endoscopy on 10/11/2016 revealed benign appearing esophageal stenosis. There were a few nonbleeding erosions in the middle third of the esophagus. Chromoscopy of the entire esophagus revealed heterogeneously stained areas.  There was a 3 cm hiatal hernia. There was a single bleeding angiodysplastic lesion in the duodenum treated by argon plasma coagulation.  Symptomatically, she has recurrent esophageal symptoms.  Exam is stable.  She has hypokalemia secondary to HCTZ.  Plan: 1.  Labs today:  CBC with diff, CMP, CEA. 2.  Review interval upper endoscopy.  Discuss need to repeat EGD.  Patient would like to see Dr. Gustavo Lah (last seen 07/19/2016). 3.  Review interval scans.  No evidence of metastatic disease.  Discuss imaging based on symptoms.  As she has recurrent upper GI tract symptoms, discuss consideration of yearly endoscopy.  Patient in agreement. 4.  Discuss potassium supplementation.  Patient counseled re: pills or liquid.  She would like to try pills.  Nursing reviewed with patient. 5.  Potassium chloride 10 meq po BID x 3 days. 6.  Schedule follow-up with Dr. Gustavo Lah  for EGD. 7.  Chest, abdomen, and pelvic CT scan on 10/10/2017. 8.  Port flush every 6-8 weeks. 9.  RTC in 6 months for MD assessment and labs (CBC with diff, CMP).   Lequita Asal, MD  03/22/2017, 11:51 AM

## 2017-03-22 NOTE — Addendum Note (Signed)
Addended by: Mallie Snooks I on: 03/22/2017 12:48 PM   Modules accepted: Orders

## 2017-03-23 ENCOUNTER — Other Ambulatory Visit: Payer: Self-pay | Admitting: Orthopedic Surgery

## 2017-03-23 DIAGNOSIS — M48061 Spinal stenosis, lumbar region without neurogenic claudication: Secondary | ICD-10-CM | POA: Diagnosis not present

## 2017-03-23 DIAGNOSIS — M47892 Other spondylosis, cervical region: Secondary | ICD-10-CM

## 2017-03-23 DIAGNOSIS — M5416 Radiculopathy, lumbar region: Secondary | ICD-10-CM | POA: Diagnosis not present

## 2017-03-23 DIAGNOSIS — M5412 Radiculopathy, cervical region: Secondary | ICD-10-CM | POA: Diagnosis not present

## 2017-03-23 DIAGNOSIS — M4316 Spondylolisthesis, lumbar region: Secondary | ICD-10-CM | POA: Diagnosis not present

## 2017-03-23 LAB — CEA: CEA: 2.7 ng/mL (ref 0.0–4.7)

## 2017-03-27 ENCOUNTER — Other Ambulatory Visit: Payer: Self-pay | Admitting: Orthopedic Surgery

## 2017-03-27 DIAGNOSIS — M5416 Radiculopathy, lumbar region: Secondary | ICD-10-CM

## 2017-03-27 DIAGNOSIS — M4316 Spondylolisthesis, lumbar region: Secondary | ICD-10-CM

## 2017-03-28 ENCOUNTER — Ambulatory Visit
Admission: RE | Admit: 2017-03-28 | Discharge: 2017-03-28 | Disposition: A | Discharge status: Home / Self Care | Payer: Medicare HMO | Source: Ambulatory Visit | Attending: Orthopedic Surgery | Admitting: Orthopedic Surgery

## 2017-03-28 DIAGNOSIS — M5416 Radiculopathy, lumbar region: Secondary | ICD-10-CM

## 2017-03-28 DIAGNOSIS — M4316 Spondylolisthesis, lumbar region: Secondary | ICD-10-CM

## 2017-03-28 DIAGNOSIS — M4726 Other spondylosis with radiculopathy, lumbar region: Secondary | ICD-10-CM | POA: Diagnosis not present

## 2017-03-28 MED ORDER — METHYLPREDNISOLONE ACETATE 40 MG/ML INJ SUSP (RADIOLOG
120.0000 mg | Freq: Once | INTRAMUSCULAR | Status: AC
  Administered 2017-03-28: 120 mg via EPIDURAL

## 2017-03-28 MED ORDER — IOPAMIDOL (ISOVUE-M 200) INJECTION 41%
1.0000 mL | Freq: Once | INTRAMUSCULAR | Status: AC
  Administered 2017-03-28: 1 mL via EPIDURAL

## 2017-03-28 NOTE — Discharge Instructions (Signed)

## 2017-03-29 ENCOUNTER — Other Ambulatory Visit: Payer: Self-pay

## 2017-04-12 ENCOUNTER — Other Ambulatory Visit: Payer: Self-pay | Admitting: Podiatry

## 2017-04-12 DIAGNOSIS — M2012 Hallux valgus (acquired), left foot: Secondary | ICD-10-CM | POA: Diagnosis not present

## 2017-04-12 DIAGNOSIS — M2042 Other hammer toe(s) (acquired), left foot: Secondary | ICD-10-CM | POA: Diagnosis not present

## 2017-04-13 ENCOUNTER — Ambulatory Visit
Admission: RE | Admit: 2017-04-13 | Discharge: 2017-04-13 | Disposition: A | Discharge status: Home / Self Care | Payer: Medicare HMO | Source: Ambulatory Visit | Attending: Family Medicine | Admitting: Family Medicine

## 2017-04-13 DIAGNOSIS — Z1231 Encounter for screening mammogram for malignant neoplasm of breast: Secondary | ICD-10-CM | POA: Diagnosis not present

## 2017-04-13 HISTORY — DX: Personal history of irradiation: Z92.3

## 2017-04-13 HISTORY — DX: Malignant neoplasm of unspecified part of unspecified bronchus or lung: C34.90

## 2017-04-13 HISTORY — DX: Personal history of antineoplastic chemotherapy: Z92.21

## 2017-04-14 DIAGNOSIS — K210 Gastro-esophageal reflux disease with esophagitis: Secondary | ICD-10-CM | POA: Diagnosis not present

## 2017-04-14 DIAGNOSIS — Z8501 Personal history of malignant neoplasm of esophagus: Secondary | ICD-10-CM | POA: Diagnosis not present

## 2017-04-14 DIAGNOSIS — I10 Essential (primary) hypertension: Secondary | ICD-10-CM | POA: Diagnosis not present

## 2017-04-14 DIAGNOSIS — E7800 Pure hypercholesterolemia, unspecified: Secondary | ICD-10-CM | POA: Diagnosis not present

## 2017-04-14 DIAGNOSIS — R69 Illness, unspecified: Secondary | ICD-10-CM | POA: Diagnosis not present

## 2017-04-14 DIAGNOSIS — H9313 Tinnitus, bilateral: Secondary | ICD-10-CM | POA: Diagnosis not present

## 2017-04-14 DIAGNOSIS — Z0000 Encounter for general adult medical examination without abnormal findings: Secondary | ICD-10-CM | POA: Diagnosis not present

## 2017-04-14 DIAGNOSIS — Z79899 Other long term (current) drug therapy: Secondary | ICD-10-CM | POA: Diagnosis not present

## 2017-04-14 DIAGNOSIS — Z923 Personal history of irradiation: Secondary | ICD-10-CM | POA: Diagnosis not present

## 2017-04-14 DIAGNOSIS — I4891 Unspecified atrial fibrillation: Secondary | ICD-10-CM | POA: Diagnosis not present

## 2017-04-14 DIAGNOSIS — Z87891 Personal history of nicotine dependence: Secondary | ICD-10-CM | POA: Diagnosis not present

## 2017-04-14 DIAGNOSIS — K5900 Constipation, unspecified: Secondary | ICD-10-CM | POA: Diagnosis not present

## 2017-04-14 DIAGNOSIS — Z6827 Body mass index (BMI) 27.0-27.9, adult: Secondary | ICD-10-CM | POA: Diagnosis not present

## 2017-04-14 DIAGNOSIS — J309 Allergic rhinitis, unspecified: Secondary | ICD-10-CM | POA: Diagnosis not present

## 2017-04-18 NOTE — Discharge Instructions (Signed)
[facility] °[facility] ° °POST OPERATIVE INSTRUCTIONS FOR DR. TROXLER AND DR. FOWLER °[facility] ° ° °1. Take your medication as prescribed.  Pain medication should be taken only as needed. ° °2. Keep the dressing clean, dry and intact. ° °3. Keep your foot elevated above the heart level for the first 48 hours. ° °4. Walking to the bathroom and brief periods of walking are acceptable, unless we have instructed you to be non-weight bearing. ° °5. Always wear your post-op shoe when walking.  Always use your crutches if you are to be non-weight bearing. ° °6. Do not take a shower. Baths are permissible as long as the foot is kept out of the water.  ° °7. Every hour you are awake:  °- Bend your knee 15 times. °- Flex foot 15 times °- Massage calf 15 times ° °8. Call [facility] ([phone]) if any of the following problems occur: °- You develop a temperature or fever. °- The bandage becomes saturated with blood. °- Medication does not stop your pain. °- Injury of the foot occurs. °- Any symptoms of infection including redness, odor, or red streaks running from wound. ° ° °General Anesthesia, Adult, Care After °These instructions provide you with information about caring for yourself after your procedure. Your health care provider may also give you more specific instructions. Your treatment has been planned according to current medical practices, but problems sometimes occur. Call your health care provider if you have any problems or questions after your procedure. °What can I expect after the procedure? °After the procedure, it is common to have: °· Vomiting. °· A sore throat. °· Mental slowness. °It is common to feel: °· Nauseous. °· Cold or shivery. °· Sleepy. °· Tired. °· Sore or achy, even in parts of your body where you did not have surgery. °Follow these instructions at home: °For at least 24 hours after the procedure: °· Do not: °¨ Participate in  activities where you could fall or become injured. °¨ Drive. °¨ Use heavy machinery. °¨ Drink alcohol. °¨ Take sleeping pills or medicines that cause drowsiness. °¨ Make important decisions or sign legal documents. °¨ Take care of children on your own. °· Rest. °Eating and drinking °· If you vomit, drink water, juice, or soup when you can drink without vomiting. °· Drink enough fluid to keep your urine clear or pale yellow. °· Make sure you have little or no nausea before eating solid foods. °· Follow the diet recommended by your health care provider. °General instructions °· Have a responsible adult stay with you until you are awake and alert. °· Return to your normal activities as told by your health care provider. Ask your health care provider what activities are safe for you. °· Take over-the-counter and prescription medicines only as told by your health care provider. °· If you smoke, do not smoke without supervision. °· Keep all follow-up visits as told by your health care provider. This is important. °Contact a health care provider if: °· You continue to have nausea or vomiting at home, and medicines are not helpful. °· You cannot drink fluids or start eating again. °· You cannot urinate after 8-12 hours. °· You develop a skin rash. °· You have fever. °· You have increasing redness at the site of your procedure. °Get help right away if: °· You have difficulty breathing. °· You have chest pain. °· You have unexpected bleeding. °· You feel that you are having   a life-threatening or urgent problem. °This information is not intended to replace advice given to you by your health care provider. Make sure you discuss any questions you have with your health care provider. °Document Released: 02/13/2001 Document Revised: 04/11/2016 Document Reviewed: 10/22/2015 °Elsevier Interactive Patient Education © 2017 [employer] ° °

## 2017-04-19 ENCOUNTER — Ambulatory Visit: Payer: Medicare HMO

## 2017-04-19 ENCOUNTER — Ambulatory Visit: Payer: Medicare HMO | Admitting: Anesthesiology

## 2017-04-19 ENCOUNTER — Encounter
Admission: RE | Disposition: A | Discharge status: Home / Self Care | Payer: Self-pay | Source: Ambulatory Visit | Attending: Podiatry

## 2017-04-19 ENCOUNTER — Ambulatory Visit
Admission: RE | Admit: 2017-04-19 | Discharge: 2017-04-19 | Disposition: A | Discharge status: Home / Self Care | Payer: Medicare HMO | Source: Ambulatory Visit | Attending: Podiatry | Admitting: Podiatry

## 2017-04-19 DIAGNOSIS — F329 Major depressive disorder, single episode, unspecified: Secondary | ICD-10-CM | POA: Insufficient documentation

## 2017-04-19 DIAGNOSIS — M24575 Contracture, left foot: Secondary | ICD-10-CM | POA: Diagnosis not present

## 2017-04-19 DIAGNOSIS — M2012 Hallux valgus (acquired), left foot: Secondary | ICD-10-CM | POA: Diagnosis not present

## 2017-04-19 DIAGNOSIS — Z79899 Other long term (current) drug therapy: Secondary | ICD-10-CM | POA: Insufficient documentation

## 2017-04-19 DIAGNOSIS — Z91040 Latex allergy status: Secondary | ICD-10-CM | POA: Insufficient documentation

## 2017-04-19 DIAGNOSIS — Z87891 Personal history of nicotine dependence: Secondary | ICD-10-CM | POA: Diagnosis not present

## 2017-04-19 DIAGNOSIS — K219 Gastro-esophageal reflux disease without esophagitis: Secondary | ICD-10-CM | POA: Insufficient documentation

## 2017-04-19 DIAGNOSIS — M25572 Pain in left ankle and joints of left foot: Secondary | ICD-10-CM | POA: Diagnosis not present

## 2017-04-19 DIAGNOSIS — M2042 Other hammer toe(s) (acquired), left foot: Secondary | ICD-10-CM | POA: Diagnosis not present

## 2017-04-19 DIAGNOSIS — Z885 Allergy status to narcotic agent status: Secondary | ICD-10-CM | POA: Diagnosis not present

## 2017-04-19 DIAGNOSIS — Z8501 Personal history of malignant neoplasm of esophagus: Secondary | ICD-10-CM | POA: Diagnosis not present

## 2017-04-19 DIAGNOSIS — I10 Essential (primary) hypertension: Secondary | ICD-10-CM | POA: Diagnosis not present

## 2017-04-19 DIAGNOSIS — G8918 Other acute postprocedural pain: Secondary | ICD-10-CM | POA: Diagnosis not present

## 2017-04-19 DIAGNOSIS — Z7982 Long term (current) use of aspirin: Secondary | ICD-10-CM | POA: Insufficient documentation

## 2017-04-19 DIAGNOSIS — Z881 Allergy status to other antibiotic agents status: Secondary | ICD-10-CM | POA: Insufficient documentation

## 2017-04-19 DIAGNOSIS — Z7952 Long term (current) use of systemic steroids: Secondary | ICD-10-CM | POA: Diagnosis not present

## 2017-04-19 DIAGNOSIS — I4891 Unspecified atrial fibrillation: Secondary | ICD-10-CM | POA: Insufficient documentation

## 2017-04-19 DIAGNOSIS — M205X2 Other deformities of toe(s) (acquired), left foot: Secondary | ICD-10-CM | POA: Insufficient documentation

## 2017-04-19 DIAGNOSIS — R69 Illness, unspecified: Secondary | ICD-10-CM | POA: Diagnosis not present

## 2017-04-19 HISTORY — PX: METATARSAL OSTEOTOMY: SHX1641

## 2017-04-19 HISTORY — PX: CAPSULOTOMY METATARSOPHALANGEAL: SHX6614

## 2017-04-19 HISTORY — PX: HAMMER TOE SURGERY: SHX385

## 2017-04-19 SURGERY — OSTEOTOMY, METATARSAL BONE
Anesthesia: General | Laterality: Left | Wound class: Clean

## 2017-04-19 MED ORDER — POVIDONE-IODINE 7.5 % EX SOLN: Freq: Once | CUTANEOUS | Status: DC

## 2017-04-19 MED ORDER — ONDANSETRON HCL 4 MG/2ML IJ SOLN
INTRAMUSCULAR | Status: DC | PRN
  Administered 2017-04-19: 4 mg via INTRAVENOUS

## 2017-04-19 MED ORDER — DEXAMETHASONE SODIUM PHOSPHATE 4 MG/ML IJ SOLN
INTRAMUSCULAR | Status: DC | PRN
  Administered 2017-04-19: 4 mg via PERINEURAL

## 2017-04-19 MED ORDER — FENTANYL CITRATE (PF) 100 MCG/2ML IJ SOLN
INTRAMUSCULAR | Status: DC | PRN
  Administered 2017-04-19: 50 ug via INTRAVENOUS

## 2017-04-19 MED ORDER — BUPIVACAINE HCL (PF) 0.25 % IJ SOLN
INTRAMUSCULAR | Status: DC | PRN
  Administered 2017-04-19: 14 mL

## 2017-04-19 MED ORDER — LACTATED RINGERS IV SOLN
INTRAVENOUS | Status: DC
  Administered 2017-04-19: 11:00:00 via INTRAVENOUS

## 2017-04-19 MED ORDER — MIDAZOLAM HCL 5 MG/5ML IJ SOLN
INTRAMUSCULAR | Status: DC | PRN
  Administered 2017-04-19: 1 mg via INTRAVENOUS

## 2017-04-19 MED ORDER — ROPIVACAINE HCL 5 MG/ML IJ SOLN
INTRAMUSCULAR | Status: DC | PRN
  Administered 2017-04-19: 35 mL via PERINEURAL

## 2017-04-19 MED ORDER — CLINDAMYCIN PHOSPHATE 900 MG/50ML IV SOLN
900.0000 mg | INTRAVENOUS | Status: AC
  Administered 2017-04-19: 900 mg via INTRAVENOUS

## 2017-04-19 MED ORDER — PROPOFOL 10 MG/ML IV BOLUS
INTRAVENOUS | Status: DC | PRN
  Administered 2017-04-19: 200 mg via INTRAVENOUS

## 2017-04-19 MED ORDER — LIDOCAINE HCL (CARDIAC) 20 MG/ML IV SOLN
INTRAVENOUS | Status: DC | PRN
  Administered 2017-04-19: 50 mg via INTRATRACHEAL

## 2017-04-19 SURGICAL SUPPLY — 48 items
BANDAGE ELASTIC 4 VELCRO NS (GAUZE/BANDAGES/DRESSINGS) ×2 IMPLANT
BENZOIN TINCTURE PRP APPL 2/3 (GAUZE/BANDAGES/DRESSINGS) ×2 IMPLANT
BLADE MED AGGRESSIVE (BLADE) ×2 IMPLANT
BLADE OSC/SAGITTAL MD 5.5X18 (BLADE) IMPLANT
BLADE SURG 15 STRL LF DISP TIS (BLADE) ×1 IMPLANT
BLADE SURG 15 STRL SS (BLADE) ×1
BNDG COHESIVE 4X5 TAN STRL (GAUZE/BANDAGES/DRESSINGS) ×2 IMPLANT
BNDG ESMARK 4X12 TAN STRL LF (GAUZE/BANDAGES/DRESSINGS) ×2 IMPLANT
BNDG GAUZE 4.5X4.1 6PLY STRL (MISCELLANEOUS) ×2 IMPLANT
BNDG STRETCH 4X75 STRL LF (GAUZE/BANDAGES/DRESSINGS) ×2 IMPLANT
CANISTER SUCT 1200ML W/VALVE (MISCELLANEOUS) ×2 IMPLANT
COVER LIGHT HANDLE UNIVERSAL (MISCELLANEOUS) ×4 IMPLANT
CUFF TOURN SGL QUICK 18 (TOURNIQUET CUFF) ×2 IMPLANT
DRAPE FLUOR MINI C-ARM 54X84 (DRAPES) ×2 IMPLANT
DURAPREP 26ML APPLICATOR (WOUND CARE) ×2 IMPLANT
FIXATION HAMMERTOE ANGLD 15MM (Toe) ×2 IMPLANT
GAUZE PETRO XEROFOAM 1X8 (MISCELLANEOUS) ×2 IMPLANT
GAUZE SPONGE 4X4 12PLY STRL (GAUZE/BANDAGES/DRESSINGS) ×2 IMPLANT
GLOVE BIO SURGEON STRL SZ7.5 (GLOVE) ×2 IMPLANT
GLOVE INDICATOR 8.0 STRL GRN (GLOVE) ×2 IMPLANT
GOWN STRL REUS W/ TWL LRG LVL3 (GOWN DISPOSABLE) ×2 IMPLANT
GOWN STRL REUS W/TWL LRG LVL3 (GOWN DISPOSABLE) ×2
HAMMERTOE ANGLED 15MM 5MM (Toe) ×4 IMPLANT
K-WIRE DBL END TROCAR 6X.045 (WIRE) ×2
K-WIRE DBL END TROCAR 6X.062 (WIRE)
KIT ROOM TURNOVER OR (KITS) ×2 IMPLANT
KWIRE DBL END TROCAR 6X.045 (WIRE) ×1 IMPLANT
KWIRE DBL END TROCAR 6X.062 (WIRE) IMPLANT
NS IRRIG 500ML POUR BTL (IV SOLUTION) ×2 IMPLANT
PACK EXTREMITY ARMC (MISCELLANEOUS) ×2 IMPLANT
PAD GROUND ADULT SPLIT (MISCELLANEOUS) ×2 IMPLANT
PIN BALLS 3/8 F/.045 WIRE (MISCELLANEOUS) ×2 IMPLANT
RASP SM TEAR CROSS CUT (RASP) ×2 IMPLANT
STAPLE SPR MET 9X9X9 1.5 (Staple) ×2 IMPLANT
STOCKINETTE IMPERVIOUS LG (DRAPES) ×2 IMPLANT
STRAP BODY AND KNEE 60X3 (MISCELLANEOUS) ×2 IMPLANT
STRIP CLOSURE SKIN 1/4X4 (GAUZE/BANDAGES/DRESSINGS) ×2 IMPLANT
SUT ETHILON 4-0 (SUTURE)
SUT ETHILON 4-0 FS2 18XMFL BLK (SUTURE)
SUT ETHILON 5-0 FS-2 18 BLK (SUTURE) ×2 IMPLANT
SUT MNCRL 5-0+ PC-1 (SUTURE) ×1 IMPLANT
SUT MONOCRYL 5-0 (SUTURE) ×1
SUT VIC AB 2-0 SH 27 (SUTURE)
SUT VIC AB 2-0 SH 27XBRD (SUTURE) IMPLANT
SUT VIC AB 3-0 SH 27 (SUTURE)
SUT VIC AB 3-0 SH 27X BRD (SUTURE) IMPLANT
SUT VIC AB 4-0 FS2 27 (SUTURE) ×2 IMPLANT
SUTURE ETHLN 4-0 FS2 18XMF BLK (SUTURE) IMPLANT

## 2017-04-19 NOTE — Transfer of Care (Signed)
Immediate Anesthesia Transfer of Care Note  Patient: Kelly Krause  Procedure(s) Performed: Procedure(s): PHALANX OSTEOTOMY-AKIN - LEFT (Left) HAMMER TOE CORRECTION-3RD & 4TH (Left) CAPSULOTOMY METATARSOPHALANGEAL (Left)  Patient Location: PACU  Anesthesia Type: General  Level of Consciousness: awake, alert  and patient cooperative  Airway and Oxygen Therapy: Patient Spontanous Breathing and Patient connected to supplemental oxygen  Post-op Assessment: Post-op Vital signs reviewed, Patient's Cardiovascular Status Stable, Respiratory Function Stable, Patent Airway and No signs of Nausea or vomiting  Post-op Vital Signs: Reviewed and stable  Complications: No apparent anesthesia complications

## 2017-04-19 NOTE — H&P (Signed)
HISTORY AND PHYSICAL INTERVAL NOTE:  04/19/2017  12:03 PM  Kelly Krause  has presented today for surgery, with the diagnosis of Hammertoe Left - M20.42 Hallux Valgus Left - M20.12.  The various methods of treatment have been discussed with the patient.  No guarantees were given.  After consideration of risks, benefits and other options for treatment, the patient has consented to surgery.  I have reviewed the patients' chart and labs.    Patient Vitals for the past 24 hrs:  BP Temp Temp src Pulse Resp SpO2 Height Weight  04/19/17 1135 (!) 158/64 - - (!) 51 12 100 % - -  04/19/17 1130 (!) 161/68 - - (!) 54 13 100 % - -  04/19/17 1125 (!) 162/68 - - (!) 57 15 99 % - -  04/19/17 1104 (!) 143/60 99 F (37.2 C) Tympanic (!) 55 16 99 % 5\' 7"  (1.702 m) 76.7 kg (169 lb)    A history and physical examination was performed in my office.  The patient was reexamined.  There have been no changes to this history and physical examination.  Samara Deist A

## 2017-04-19 NOTE — Anesthesia Procedure Notes (Signed)
Anesthesia Regional Block: Popliteal block   Pre-Anesthetic Checklist: ,, timeout performed, Correct Patient, Correct Site, Correct Laterality, Correct Procedure, Correct Position, site marked, Risks and benefits discussed,  Surgical consent,  Pre-op evaluation,  At surgeon's request and post-op pain management  Laterality: Left  Prep: chloraprep       Needles:  Injection technique: Single-shot  Needle Type: Echogenic Needle     Needle Length: 9cm  Needle Gauge: 21     Additional Needles:   Procedures: ultrasound guided,,,,,,,,  Narrative:  Start time: 04/19/2017 11:25 AM End time: 04/19/2017 11:35 AM Injection made incrementally with aspirations every 5 mL.  Performed by: Personally  Anesthesiologist: Ronelle Nigh  Additional Notes: Functioning IV was confirmed and monitors applied. Ultrasound guidance: relevant anatomy identified, needle position confirmed, local anesthetic spread visualized around nerve(s)., vascular puncture avoided.  Image printed for medical record.  Negative aspiration and no paresthesias; incremental administration of local anesthetic. The patient tolerated the procedure well. Vitals signes recorded in RN notes.  Polpiteal and Femoral (Adductor approach).  25 ml to Pop, 10 ml to [facility].

## 2017-04-19 NOTE — Anesthesia Procedure Notes (Signed)
Procedure Name: LMA Insertion Date/Time: 04/19/2017 12:20 PM Performed by: Londell Moh Pre-anesthesia Checklist: Patient identified, Emergency Drugs available, Suction available, Timeout performed and Patient being monitored Patient Re-evaluated:Patient Re-evaluated prior to inductionOxygen Delivery Method: Circle system utilized Preoxygenation: Pre-oxygenation with 100% oxygen Intubation Type: IV induction LMA: LMA inserted LMA Size: 4.0 Number of attempts: 1 Placement Confirmation: positive ETCO2 and breath sounds checked- equal and bilateral Tube secured with: Tape

## 2017-04-19 NOTE — Op Note (Signed)
Operative note   Surgeon:Abby Stines Lawyer: None    Preop diagnosis: 1. Hallux valgus deformity left foot 2. Contracture left second metatarsophalangeal joint 3. Hammertoe contracture left third toe.. Hammertoe contracture left fourth toe    Postop diagnosis: Same    Procedure:1. Akin hallux valgus correction left foot 2. Release of metatarsophalangeal joint left second MTPJ 3. Z-plasty skin lengthening dorsal left foot 4. Hammertoe repair left third toe with hammerlock implant 5. Hammertoe repair left fifth toe with hammerlock implant    EBL: Minimal    Anesthesia:local and regional    Hemostasis: Ankle tourniquet inflated to 200 mmHg for approximate 75 minutes    Specimen: None    Complications: None    Operative indications:Kelly Krause is an 76 y.o. that presents today for surgical intervention.  The risks/benefits/alternatives/complications have been discussed and consent has been given.    Procedure:  Patient was brought into the OR and placed on the operating table in thesupine position. After anesthesia was obtained theleft lower extremity was prepped and draped in usual sterile fashion.  Attention was directed to the dorsomedial left great toe where a longitudinal incision was made overlying the proximal phalanx from the metatarsophalangeal joint to the interphalangeal joint. Sharp and blunt dissection carried down to the capsule. A longitudinal capsulotomy was performed as well as subperiosteal dissection of the proximal phalanx. Next a 0.045 K wire was driven on the lateral aspect of the proximal phalanx to uses an apical access guide. Next a small wedge the osteotomy was created. The gap was then closed in this was stabilized with a 9 x 9 mm compression staple. Good realignment of the great toe was noted. A small capsular correction was performed medially at the metatarsophalangeal joint. Wound was flushed with copious varus or irrigation and layered closure was  performed with a 4-0 Vicryl for the subcutaneous and capsular tissue and a 5-0 Monocryl for skin.  Attention was then directed to the second toe at the MTPJ where a noted dorsal contracture was found. Tautness of the skin was noted as well. A longitudinal incision was made in line with the extensor tendon over the metatarsophalangeal joint. A Z-lengthening of the long extensor tendon was then created. The metatarsophalangeal joint was noted and released at the dorsal medial lateral capsular level. Good realignment of the second toe was noted at this time. The wound was flushed with the SMAS irrigation. The subcutaneous tissue was closed with a 4-0 Vicryl. A Z-plasty skin lengthening was created to allow less tension of the dorsal skin. This was closed with a 5-0 nylon.  Attention was then directed to the third and fourth toes were longitudinal incision was made overlying the PIPJ's. Sharp and blunt dissection carried down to the extensor tendon. The head of the proximal phalanx and base of the middle phalanx were then exposed. These were then resected with a power saw. Using standard technique for implantation of the hammerlock implant these were then placed. Medium angled implants were then used in the PIPJ of the third and fourth toes.  Closure of the third and fourth toes was accomplished with 5-0 nylon suture. All areas were then infiltrated with 0.25% Marcaine.  Patient was placed in a large bulky sterile dressing.    Patient tolerated the procedure and anesthesia well.  Was transported from the OR to the PACU with all vital signs stable and vascular status intact. To be discharged per routine protocol.  Will follow up in approximately 1 week  in the outpatient clinic.

## 2017-04-19 NOTE — Anesthesia Postprocedure Evaluation (Signed)
Anesthesia Post Note  Patient: Moshe Cipro  Procedure(s) Performed: Procedure(s) (LRB): PHALANX OSTEOTOMY-AKIN - LEFT (Left) HAMMER TOE CORRECTION-3RD & 4TH (Left) CAPSULOTOMY METATARSOPHALANGEAL (Left)  Patient location during evaluation: PACU Anesthesia Type: General Level of consciousness: awake and alert and oriented Pain management: satisfactory to patient Vital Signs Assessment: post-procedure vital signs reviewed and stable Respiratory status: spontaneous breathing, nonlabored ventilation and respiratory function stable Cardiovascular status: blood pressure returned to baseline and stable Postop Assessment: Adequate PO intake and No signs of nausea or vomiting Anesthetic complications: no    Raliegh Ip

## 2017-04-19 NOTE — Progress Notes (Signed)
Assisted Clance Boll ANMD with left, ultrasound guided, popliteal/saphenous block. Side rails up, monitors on throughout procedure. See vital signs in flow sheet. Tolerated Procedure well.

## 2017-04-19 NOTE — Anesthesia Preprocedure Evaluation (Signed)
Anesthesia Evaluation  Patient identified by MRN, date of birth, ID band Patient awake    Reviewed: Allergy & Precautions, H&P , NPO status , Patient's Chart, lab work & pertinent test results  Airway Mallampati: II  TM Distance: >3 FB Neck ROM: full    Dental no notable dental hx.    Pulmonary former smoker,    Pulmonary exam normal        Cardiovascular hypertension, Normal cardiovascular exam+ dysrhythmias Atrial Fibrillation      Neuro/Psych PSYCHIATRIC DISORDERS    GI/Hepatic GERD  ,  Endo/Other    Renal/GU      Musculoskeletal   Abdominal   Peds  Hematology   Anesthesia Other Findings   Reproductive/Obstetrics                             Anesthesia Physical Anesthesia Plan  ASA: III  Anesthesia Plan: General   Post-op Pain Management:  Regional for Post-op pain and GA combined w/ Regional for post-op pain   Induction:   Airway Management Planned:   Additional Equipment:   Intra-op Plan:   Post-operative Plan:   Informed Consent: I have reviewed the patients History and Physical, chart, labs and discussed the procedure including the risks, benefits and alternatives for the proposed anesthesia with the patient or authorized representative who has indicated his/her understanding and acceptance.     Plan Discussed with:   Anesthesia Plan Comments:         Anesthesia Quick Evaluation

## 2017-04-20 ENCOUNTER — Encounter: Payer: Self-pay | Admitting: Podiatry

## 2017-04-24 DIAGNOSIS — M2040 Other hammer toe(s) (acquired), unspecified foot: Secondary | ICD-10-CM | POA: Diagnosis not present

## 2017-05-03 ENCOUNTER — Inpatient Hospital Stay: Payer: Medicare HMO | Attending: Hematology and Oncology

## 2017-05-11 DIAGNOSIS — R1314 Dysphagia, pharyngoesophageal phase: Secondary | ICD-10-CM | POA: Diagnosis not present

## 2017-05-11 DIAGNOSIS — Z85038 Personal history of other malignant neoplasm of large intestine: Secondary | ICD-10-CM | POA: Diagnosis not present

## 2017-05-11 DIAGNOSIS — Z8501 Personal history of malignant neoplasm of esophagus: Secondary | ICD-10-CM | POA: Diagnosis not present

## 2017-05-29 DIAGNOSIS — M2042 Other hammer toe(s) (acquired), left foot: Secondary | ICD-10-CM | POA: Diagnosis not present

## 2017-06-14 ENCOUNTER — Inpatient Hospital Stay: Payer: Medicare HMO | Attending: Hematology and Oncology

## 2017-06-14 DIAGNOSIS — Z79899 Other long term (current) drug therapy: Secondary | ICD-10-CM | POA: Diagnosis not present

## 2017-06-14 DIAGNOSIS — M5432 Sciatica, left side: Secondary | ICD-10-CM | POA: Diagnosis not present

## 2017-06-14 DIAGNOSIS — R69 Illness, unspecified: Secondary | ICD-10-CM | POA: Diagnosis not present

## 2017-06-14 DIAGNOSIS — I10 Essential (primary) hypertension: Secondary | ICD-10-CM | POA: Diagnosis not present

## 2017-06-14 DIAGNOSIS — M542 Cervicalgia: Secondary | ICD-10-CM | POA: Diagnosis not present

## 2017-06-14 DIAGNOSIS — E782 Mixed hyperlipidemia: Secondary | ICD-10-CM | POA: Diagnosis not present

## 2017-06-14 MED ORDER — HEPARIN SOD (PORK) LOCK FLUSH 100 UNIT/ML IV SOLN: 500.0000 [IU] | Freq: Once | INTRAVENOUS | Status: AC

## 2017-06-14 MED ORDER — SODIUM CHLORIDE 0.9% FLUSH
10.0000 mL | INTRAVENOUS | Status: AC | PRN
  Filled 2017-06-14: qty 10

## 2017-06-30 ENCOUNTER — Other Ambulatory Visit: Payer: Self-pay | Admitting: Family Medicine

## 2017-07-10 DIAGNOSIS — M2042 Other hammer toe(s) (acquired), left foot: Secondary | ICD-10-CM | POA: Diagnosis not present

## 2017-07-12 DIAGNOSIS — M47812 Spondylosis without myelopathy or radiculopathy, cervical region: Secondary | ICD-10-CM | POA: Diagnosis not present

## 2017-07-12 DIAGNOSIS — M5136 Other intervertebral disc degeneration, lumbar region: Secondary | ICD-10-CM | POA: Diagnosis not present

## 2017-07-12 DIAGNOSIS — M5416 Radiculopathy, lumbar region: Secondary | ICD-10-CM | POA: Diagnosis not present

## 2017-07-12 DIAGNOSIS — M5030 Other cervical disc degeneration, unspecified cervical region: Secondary | ICD-10-CM | POA: Diagnosis not present

## 2017-07-26 ENCOUNTER — Inpatient Hospital Stay: Payer: Medicare HMO | Attending: Hematology and Oncology

## 2017-08-08 DIAGNOSIS — K449 Diaphragmatic hernia without obstruction or gangrene: Secondary | ICD-10-CM | POA: Diagnosis not present

## 2017-08-08 DIAGNOSIS — Z8673 Personal history of transient ischemic attack (TIA), and cerebral infarction without residual deficits: Secondary | ICD-10-CM | POA: Diagnosis not present

## 2017-08-08 DIAGNOSIS — I10 Essential (primary) hypertension: Secondary | ICD-10-CM | POA: Diagnosis not present

## 2017-08-08 DIAGNOSIS — K222 Esophageal obstruction: Secondary | ICD-10-CM | POA: Diagnosis not present

## 2017-08-08 DIAGNOSIS — I471 Supraventricular tachycardia: Secondary | ICD-10-CM | POA: Diagnosis not present

## 2017-08-08 DIAGNOSIS — I4891 Unspecified atrial fibrillation: Secondary | ICD-10-CM | POA: Diagnosis not present

## 2017-08-08 DIAGNOSIS — K219 Gastro-esophageal reflux disease without esophagitis: Secondary | ICD-10-CM | POA: Diagnosis not present

## 2017-08-08 DIAGNOSIS — Z85118 Personal history of other malignant neoplasm of bronchus and lung: Secondary | ICD-10-CM | POA: Diagnosis not present

## 2017-08-08 DIAGNOSIS — C159 Malignant neoplasm of esophagus, unspecified: Secondary | ICD-10-CM | POA: Diagnosis not present

## 2017-08-08 DIAGNOSIS — Z85038 Personal history of other malignant neoplasm of large intestine: Secondary | ICD-10-CM | POA: Diagnosis not present

## 2017-08-17 DIAGNOSIS — M47812 Spondylosis without myelopathy or radiculopathy, cervical region: Secondary | ICD-10-CM | POA: Diagnosis not present

## 2017-08-31 DIAGNOSIS — M5416 Radiculopathy, lumbar region: Secondary | ICD-10-CM | POA: Diagnosis not present

## 2017-08-31 DIAGNOSIS — M5136 Other intervertebral disc degeneration, lumbar region: Secondary | ICD-10-CM | POA: Diagnosis not present

## 2017-09-06 ENCOUNTER — Inpatient Hospital Stay: Payer: Medicare HMO | Attending: Hematology and Oncology

## 2017-09-22 IMAGING — CT CT CHEST W/ CM
2 of 5 series · 15 of 36 positions shown, 18 images · IV contrast (iopamidol)
Comparison: 02/25/2015 PET.

CLINICAL DATA: History of esophageal cancer. Restaging. Colon
cancer. Partial right pneumonectomy. Short and radiation therapy and

EXAM:
CT CHEST AND ABDOMEN WITH CONTRAST
TECHNIQUE: Multidetector CT imaging of the chest and abdomen was performed
following the standard protocol during bolus administration of
intravenous contrast.
CONTRAST:  125mL 15GR2X-J77 IOPAMIDOL (15GR2X-J77) INJECTION 61%

[Series 6: lung · axial · 0.70mm/px · z∈[-438,-202]mm · 12 of 138 slices shown, 15 images]
[im 10/138  mediastinal]
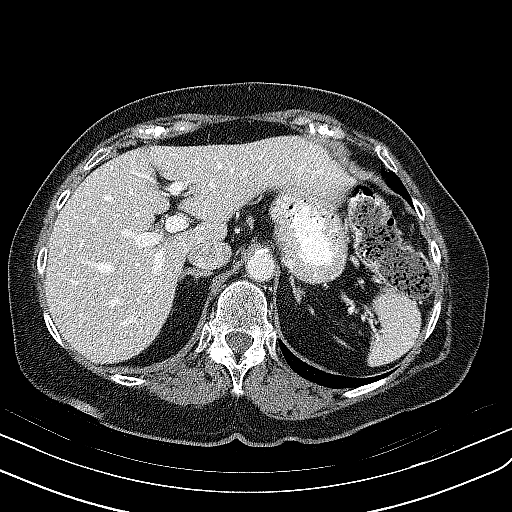
[im 10/138  lung]
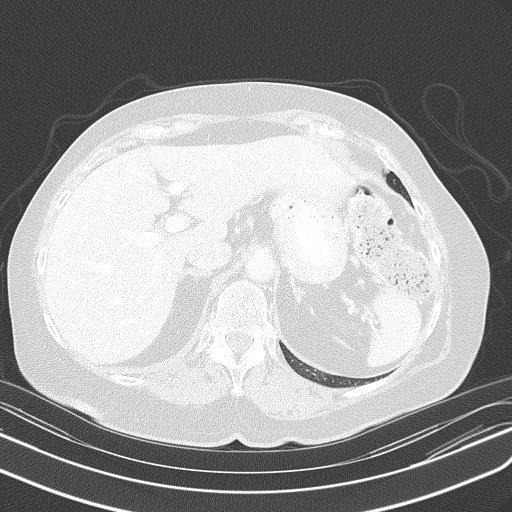
[im 19/138  lung]
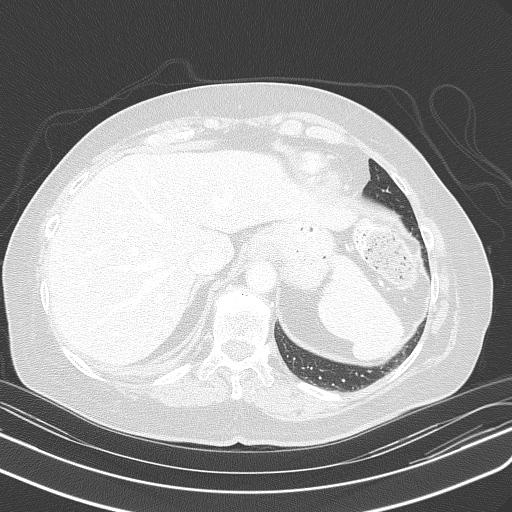
[im 28/138  lung]
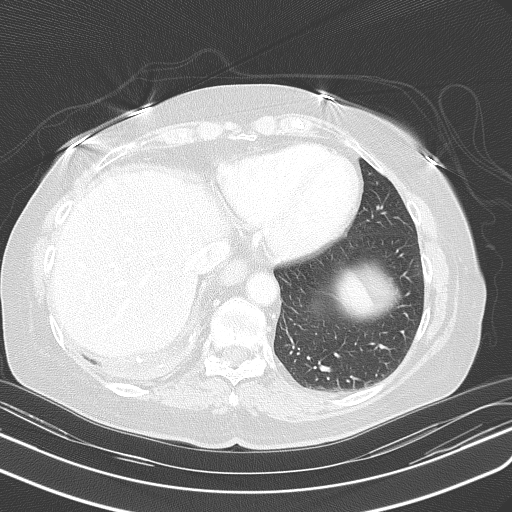
[im 46/138  lung]
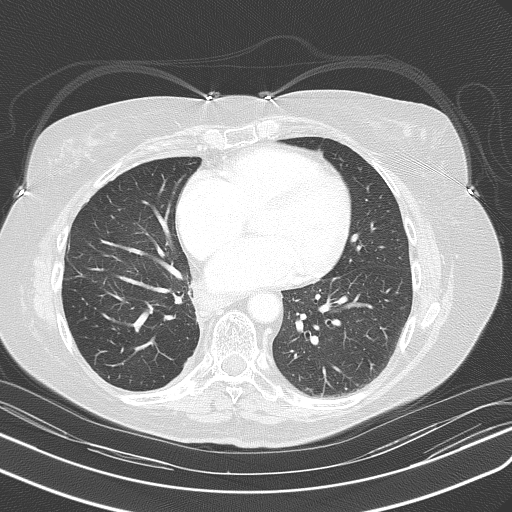
[im 55/138  mediastinal]
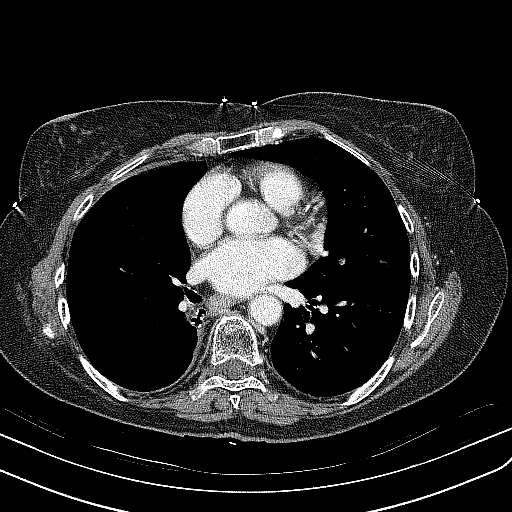
[im 55/138  lung]
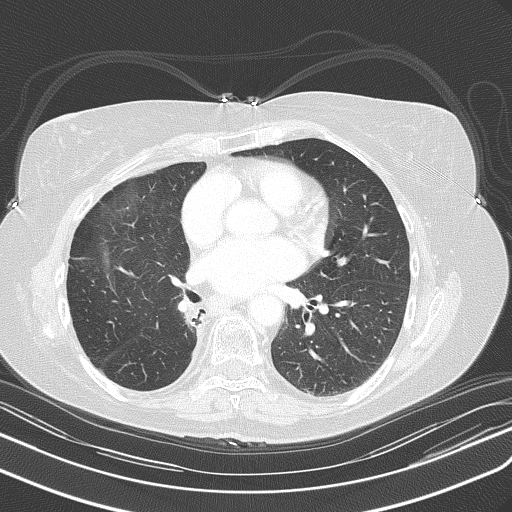
[im 64/138  lung]
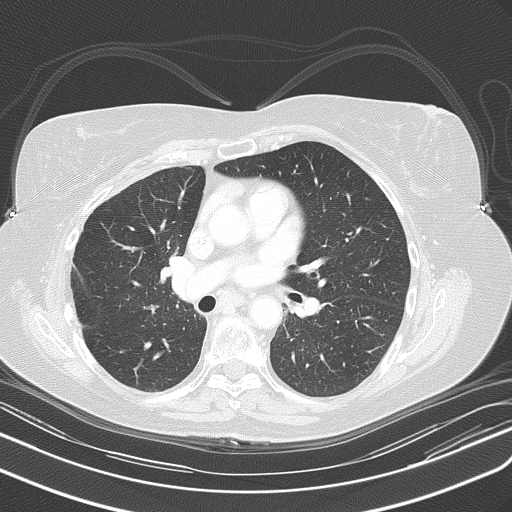
[im 74/138  lung]
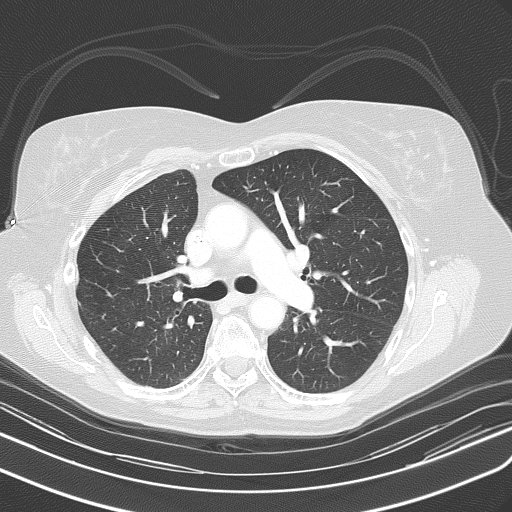
[im 83/138  lung]
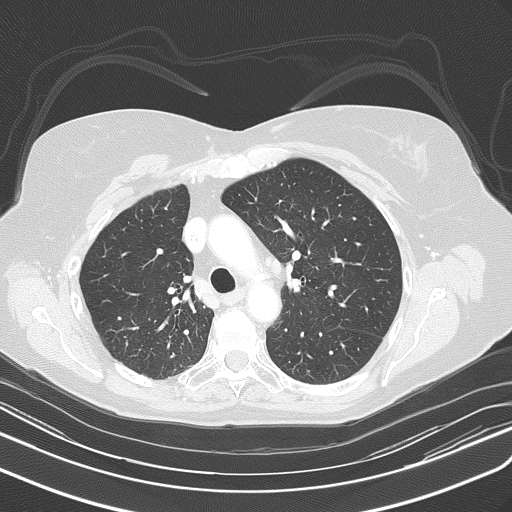
[im 92/138  mediastinal]
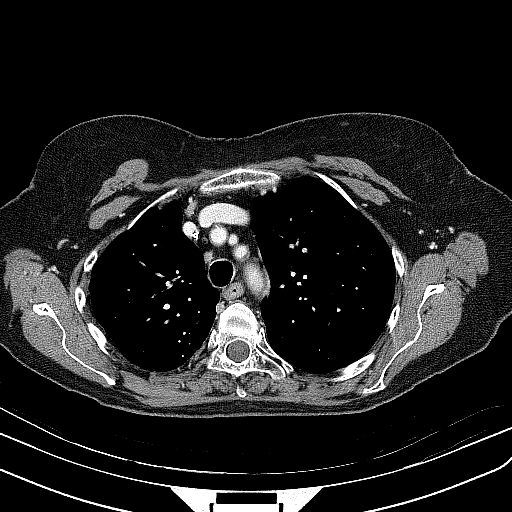
[im 92/138  lung]
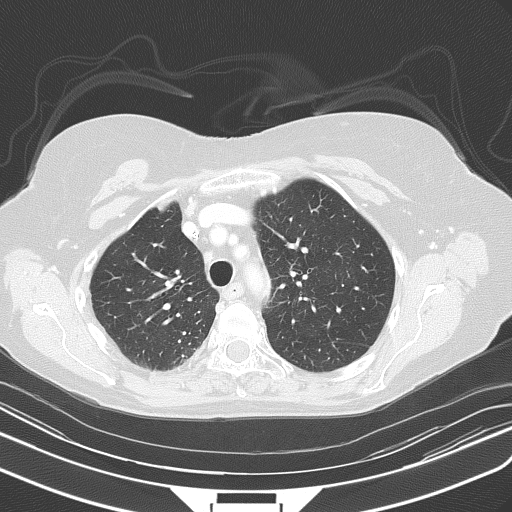
[im 110/138  lung]
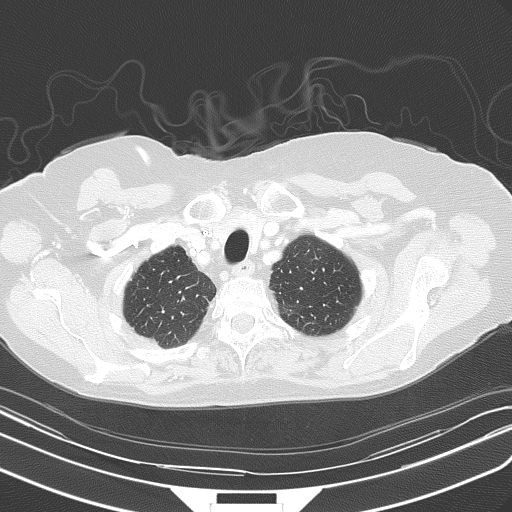
[im 119/138  lung]
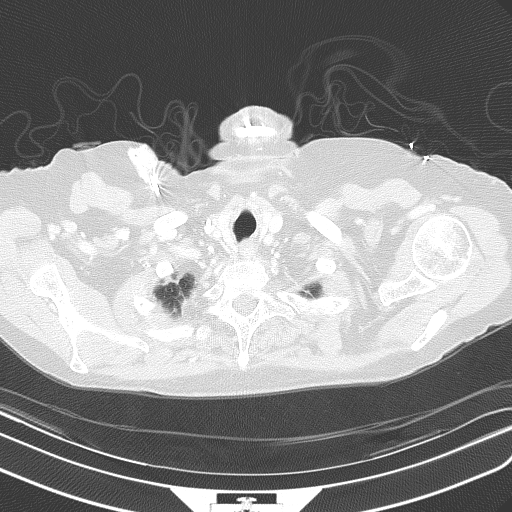
[im 128/138  lung]
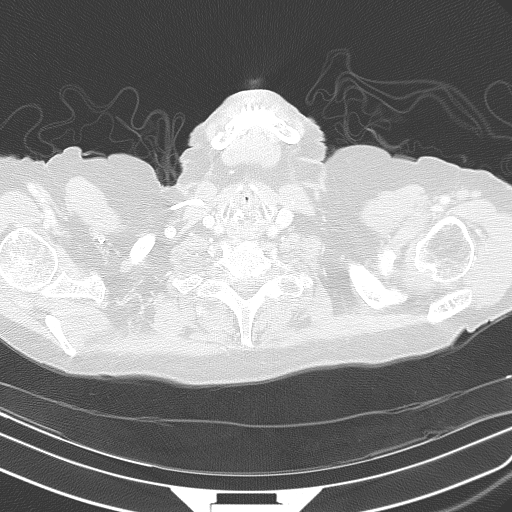

[Series 602: coronal · coronal · 0.82mm/px · 3 of 92 slices shown]
[im 19/92  lung]
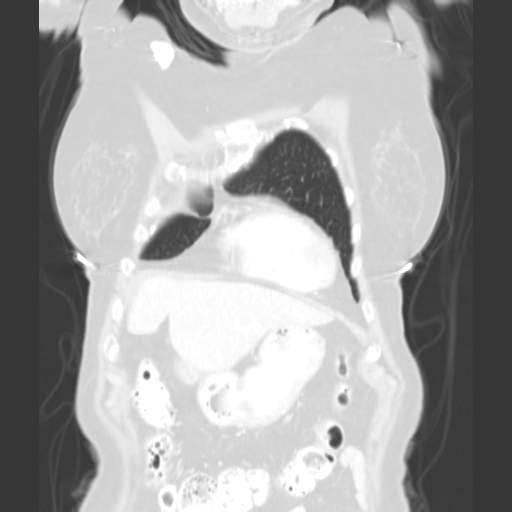
[im 37/92  lung]
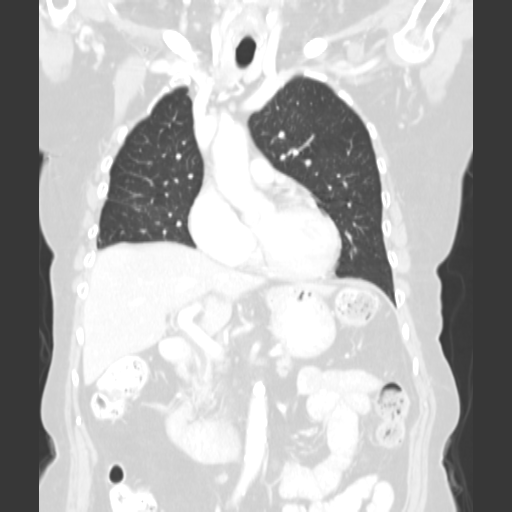
[im 55/92  lung]
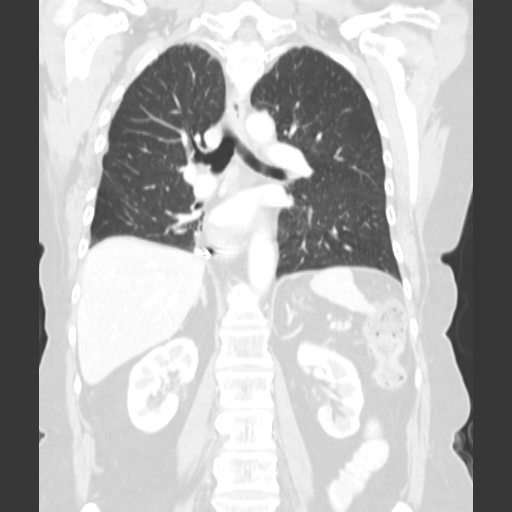

[15 of 36 positions shown; findings below may reference images not displayed]

FINDINGS: CT CHEST WITH CONTRAST

Mediastinum/Lymph Nodes: No supraclavicular adenopathy. A right
Port-A-Cath terminates at the low SVC. Aortic and branch vessel
atherosclerosis. Borderline cardiomegaly. No pericardial effusion.
Multivessel coronary artery atherosclerosis. No central pulmonary
embolism, on this non-dedicated study. No mediastinal or hilar
adenopathy. No dominant esophageal mass or esophageal obstruction.

Lungs/Pleura: Right-sided pleural thickening and minimal
calcification. Similar. No pleural fluid. At least partial right
lower lobectomy. Volume loss in the inferior right hemi thorax.

Musculoskeletal: Right rib defects are likely post operative.

CT ABDOMEN WITH CONTRAST

Hepatobiliary: Normal liver. Normal gallbladder, without biliary
ductal dilatation.

Pancreas: Moderate pancreatic atrophy, without duct dilatation or
mass.

Spleen: Normal in size, without focal abnormality.

Adrenals/Urinary Tract: Normal adrenal glands. Normal kidneys,
without hydronephrosis.

Stomach/Bowel: Normal stomach, without wall thickening. Normal
abdominal large and small bowel loops.

Vascular/Lymphatic: Aortic and branch vessel atherosclerosis. No
retroperitoneal or retrocrural adenopathy.

Other: No ascites.  No evidence of omental or peritoneal disease.

Musculoskeletal: No acute osseous abnormality.
IMPRESSION: 1. No acute process or evidence of metastatic disease within the
chest/abdomen.
2. Similar right-sided pleural thickening.
3.  Atherosclerosis, including within the coronary arteries.

## 2017-09-25 ENCOUNTER — Encounter: Payer: Self-pay | Admitting: *Deleted

## 2017-09-25 ENCOUNTER — Other Ambulatory Visit: Payer: Self-pay | Admitting: Family Medicine

## 2017-09-25 DIAGNOSIS — F3341 Major depressive disorder, recurrent, in partial remission: Secondary | ICD-10-CM

## 2017-09-25 DIAGNOSIS — I10 Essential (primary) hypertension: Secondary | ICD-10-CM

## 2017-09-26 ENCOUNTER — Encounter
Admission: RE | Disposition: A | Discharge status: Home / Self Care | Payer: Self-pay | Source: Ambulatory Visit | Attending: Gastroenterology

## 2017-09-26 ENCOUNTER — Ambulatory Visit: Payer: Medicare HMO | Admitting: Anesthesiology

## 2017-09-26 ENCOUNTER — Ambulatory Visit
Admission: RE | Admit: 2017-09-26 | Discharge: 2017-09-26 | Disposition: A | Discharge status: Home / Self Care | Payer: Medicare HMO | Source: Ambulatory Visit | Attending: Gastroenterology | Admitting: Gastroenterology

## 2017-09-26 DIAGNOSIS — I252 Old myocardial infarction: Secondary | ICD-10-CM | POA: Diagnosis not present

## 2017-09-26 DIAGNOSIS — C3490 Malignant neoplasm of unspecified part of unspecified bronchus or lung: Secondary | ICD-10-CM | POA: Insufficient documentation

## 2017-09-26 DIAGNOSIS — Z888 Allergy status to other drugs, medicaments and biological substances status: Secondary | ICD-10-CM | POA: Insufficient documentation

## 2017-09-26 DIAGNOSIS — D122 Benign neoplasm of ascending colon: Secondary | ICD-10-CM | POA: Insufficient documentation

## 2017-09-26 DIAGNOSIS — Z87891 Personal history of nicotine dependence: Secondary | ICD-10-CM | POA: Insufficient documentation

## 2017-09-26 DIAGNOSIS — E785 Hyperlipidemia, unspecified: Secondary | ICD-10-CM | POA: Diagnosis not present

## 2017-09-26 DIAGNOSIS — R69 Illness, unspecified: Secondary | ICD-10-CM | POA: Diagnosis not present

## 2017-09-26 DIAGNOSIS — Z880 Allergy status to penicillin: Secondary | ICD-10-CM | POA: Diagnosis not present

## 2017-09-26 DIAGNOSIS — K219 Gastro-esophageal reflux disease without esophagitis: Secondary | ICD-10-CM | POA: Diagnosis not present

## 2017-09-26 DIAGNOSIS — I4891 Unspecified atrial fibrillation: Secondary | ICD-10-CM | POA: Diagnosis not present

## 2017-09-26 DIAGNOSIS — F329 Major depressive disorder, single episode, unspecified: Secondary | ICD-10-CM | POA: Diagnosis not present

## 2017-09-26 DIAGNOSIS — D123 Benign neoplasm of transverse colon: Secondary | ICD-10-CM | POA: Insufficient documentation

## 2017-09-26 DIAGNOSIS — K635 Polyp of colon: Secondary | ICD-10-CM | POA: Insufficient documentation

## 2017-09-26 DIAGNOSIS — K5730 Diverticulosis of large intestine without perforation or abscess without bleeding: Secondary | ICD-10-CM | POA: Insufficient documentation

## 2017-09-26 DIAGNOSIS — K6389 Other specified diseases of intestine: Secondary | ICD-10-CM | POA: Diagnosis not present

## 2017-09-26 DIAGNOSIS — D124 Benign neoplasm of descending colon: Secondary | ICD-10-CM | POA: Insufficient documentation

## 2017-09-26 DIAGNOSIS — I10 Essential (primary) hypertension: Secondary | ICD-10-CM | POA: Diagnosis not present

## 2017-09-26 DIAGNOSIS — C159 Malignant neoplasm of esophagus, unspecified: Secondary | ICD-10-CM | POA: Diagnosis not present

## 2017-09-26 DIAGNOSIS — Z79899 Other long term (current) drug therapy: Secondary | ICD-10-CM | POA: Insufficient documentation

## 2017-09-26 DIAGNOSIS — K639 Disease of intestine, unspecified: Secondary | ICD-10-CM | POA: Diagnosis not present

## 2017-09-26 DIAGNOSIS — C180 Malignant neoplasm of cecum: Secondary | ICD-10-CM | POA: Insufficient documentation

## 2017-09-26 DIAGNOSIS — Z91040 Latex allergy status: Secondary | ICD-10-CM | POA: Diagnosis not present

## 2017-09-26 DIAGNOSIS — D126 Benign neoplasm of colon, unspecified: Secondary | ICD-10-CM | POA: Diagnosis not present

## 2017-09-26 DIAGNOSIS — Z85038 Personal history of other malignant neoplasm of large intestine: Secondary | ICD-10-CM | POA: Diagnosis not present

## 2017-09-26 DIAGNOSIS — Z08 Encounter for follow-up examination after completed treatment for malignant neoplasm: Secondary | ICD-10-CM | POA: Diagnosis present

## 2017-09-26 DIAGNOSIS — Z86010 Personal history of colonic polyps: Secondary | ICD-10-CM | POA: Diagnosis not present

## 2017-09-26 DIAGNOSIS — C189 Malignant neoplasm of colon, unspecified: Secondary | ICD-10-CM | POA: Diagnosis not present

## 2017-09-26 DIAGNOSIS — Z8719 Personal history of other diseases of the digestive system: Secondary | ICD-10-CM | POA: Diagnosis not present

## 2017-09-26 DIAGNOSIS — K5790 Diverticulosis of intestine, part unspecified, without perforation or abscess without bleeding: Secondary | ICD-10-CM | POA: Diagnosis not present

## 2017-09-26 HISTORY — PX: COLONOSCOPY WITH PROPOFOL: SHX5780

## 2017-09-26 HISTORY — DX: Unspecified atrial fibrillation: I48.91

## 2017-09-26 HISTORY — DX: Supraventricular tachycardia, unspecified: I47.10

## 2017-09-26 HISTORY — DX: Other cervical disc degeneration, unspecified cervical region: M50.30

## 2017-09-26 HISTORY — DX: Cardiac arrhythmia, unspecified: I49.9

## 2017-09-26 HISTORY — DX: Supraventricular tachycardia: I47.1

## 2017-09-26 HISTORY — DX: Hyperlipidemia, unspecified: E78.5

## 2017-09-26 SURGERY — COLONOSCOPY WITH PROPOFOL
Anesthesia: General

## 2017-09-26 MED ORDER — FENTANYL CITRATE (PF) 100 MCG/2ML IJ SOLN
INTRAMUSCULAR | Status: DC | PRN
  Administered 2017-09-26 (×2): 50 ug via INTRAVENOUS

## 2017-09-26 MED ORDER — SODIUM CHLORIDE 0.9 % IV SOLN
INTRAVENOUS | Status: DC
Start: 2017-09-26 — End: 2017-09-26

## 2017-09-26 MED ORDER — PROPOFOL 10 MG/ML IV BOLUS
INTRAVENOUS | Status: DC | PRN
  Administered 2017-09-26: 20 mg via INTRAVENOUS
  Administered 2017-09-26 (×2): 30 mg via INTRAVENOUS
  Administered 2017-09-26 (×2): 20 mg via INTRAVENOUS
  Administered 2017-09-26: 30 mg via INTRAVENOUS
  Administered 2017-09-26: 20 mg via INTRAVENOUS

## 2017-09-26 MED ORDER — SODIUM CHLORIDE 0.9 % IV SOLN
INTRAVENOUS | Status: DC
Start: 2017-09-26 — End: 2017-09-26
  Administered 2017-09-26: 11:00:00 via INTRAVENOUS

## 2017-09-26 MED ORDER — EPHEDRINE SULFATE 50 MG/ML IJ SOLN
INTRAMUSCULAR | Status: DC | PRN
  Administered 2017-09-26: 10 mg via INTRAVENOUS

## 2017-09-26 NOTE — Anesthesia Preprocedure Evaluation (Signed)
Anesthesia Evaluation  Patient identified by MRN, date of birth, ID band Patient awake    Reviewed: Allergy & Precautions, NPO status , Patient's Chart, lab work & pertinent test results  Airway Mallampati: II       Dental  (+) Teeth Intact   Pulmonary former smoker,    breath sounds clear to auscultation       Cardiovascular Exercise Tolerance: Good hypertension, Pt. on medications + Past MI  + dysrhythmias Atrial Fibrillation and Supra Ventricular Tachycardia  Rhythm:Regular     Neuro/Psych  Headaches, Depression    GI/Hepatic Neg liver ROS, PUD, GERD  Medicated,  Endo/Other  negative endocrine ROS  Renal/GU negative Renal ROS     Musculoskeletal   Abdominal Normal abdominal exam  (+)   Peds  Hematology negative hematology ROS (+)   Anesthesia Other Findings   Reproductive/Obstetrics                             Anesthesia Physical Anesthesia Plan  ASA: III  Anesthesia Plan: General   Post-op Pain Management:    Induction: Intravenous  PONV Risk Score and Plan: 3 and 0  Airway Management Planned: Natural Airway and Nasal Cannula  Additional Equipment:   Intra-op Plan:   Post-operative Plan:   Informed Consent: I have reviewed the patients History and Physical, chart, labs and discussed the procedure including the risks, benefits and alternatives for the proposed anesthesia with the patient or authorized representative who has indicated his/her understanding and acceptance.     Plan Discussed with: Surgeon  Anesthesia Plan Comments:         Anesthesia Quick Evaluation

## 2017-09-26 NOTE — H&P (Signed)
Outpatient short stay form Pre-procedure 09/26/2017 11:05 AM Kelly Sails MD  Primary Physician: [name]  Reason for visit:  Colonoscopy  History of present illness:  Kelly Krause is a 76 year old female presenting today as above. She has personal history of adenomatous colon polyps. She tolerated her prep well. She takes no aspirin or blood thinning agents.    Current Facility-Administered Medications:  .  0.9 %  sodium chloride infusion, , Intravenous, Continuous, Kelly Sails, MD .  0.9 %  sodium chloride infusion, , Intravenous, Continuous, Kelly Sails, MD  Facility-Administered Medications Ordered in Other Encounters:  .  0.9 %  sodium chloride infusion, , Intravenous, Continuous, Herring, Orville Govern, NP, Last Rate: 999 mL/hr at 07/20/15 1146 .  heparin lock flush 100 unit/mL, 500 Units, Intravenous, Once, Choksi, Janak, MD .  heparin lock flush 100 unit/mL, 500 Units, Intravenous, Once, Corcoran, Melissa C, MD .  sodium chloride 0.9 % injection 10 mL, 10 mL, Intravenous, PRN, Choksi, Janak, MD, 10 mL at 07/20/15 1147 .  sodium chloride flush (NS) 0.9 % injection 10 mL, 10 mL, Intravenous, PRN, Mike Gip, Melissa C, MD, 10 mL at 03/22/17 1235 .  sodium chloride flush (NS) 0.9 % injection 10 mL, 10 mL, Intravenous, PRN, Mike Gip, Melissa C, MD  Medications Prior to Admission  Medication Sig Dispense Refill Last Dose  . amLODipine (NORVASC) 10 MG tablet TAKE ONE TABLET BY MOUTH ONCE DAILY-NEED  APPOINTMENT 90 tablet 2 09/25/2017 at Unknown time  . Biotin 300 MCG TABS Take 1 tablet by mouth daily.   Past Month at Unknown time  . buPROPion (WELLBUTRIN SR) 150 MG 12 hr tablet Take 1 tablet (150 mg total) by mouth 2 (two) times daily. 180 tablet 2 09/25/2017 at Unknown time  . cetirizine (ZYRTEC) 10 MG tablet Take 1 tablet (10 mg total) by mouth 2 (two) times daily. otc 90 tablet 3 09/25/2017 at Unknown time  . diphenhydrAMINE (BENADRYL) 25 mg capsule Take 25 mg by mouth.    Past Week at Unknown time  . docusate sodium (COLACE) 100 MG capsule Take 200 mg by mouth daily.   09/25/2017 at Unknown time  . fluticasone (FLONASE) 50 MCG/ACT nasal spray USE ONE SPRAY(S) IN EACH NOSTRIL ONCE DAILY 16 g 11 09/25/2017 at Unknown time  . hydrochlorothiazide (HYDRODIURIL) 25 MG tablet TAKE ONE TABLET BY MOUTH ONCE DAILY 30 tablet 0 09/25/2017 at Unknown time  . pantoprazole (PROTONIX) 40 MG tablet Take 1 tablet (40 mg total) by mouth 2 (two) times daily. 180 tablet 2 09/25/2017 at Unknown time  . ramipril (ALTACE) 10 MG capsule TAKE ONE CAPSULE BY MOUTH ONCE DAILY. MUST SCHEDULE APPOINTMENT FOR NOVEMBER 90 capsule 2 09/25/2017 at Unknown time  . sertraline (ZOLOFT) 25 MG tablet Take 1 tablet (25 mg total) by mouth daily. 90 tablet 2 09/25/2017 at Unknown time  . simvastatin (ZOCOR) 40 MG tablet Take 1 tablet (40 mg total) by mouth daily. 90 tablet 2 09/25/2017 at Unknown time  . sotalol (BETAPACE) 80 MG tablet Take 1 tablet (80 mg total) by mouth daily. 60 tablet 5 09/25/2017 at Unknown time  . Cholecalciferol (VITAMIN D-3) 1000 UNITS CAPS Take 1 capsule by mouth daily.   09/20/2017  . fluticasone (FLONASE) 50 MCG/ACT nasal spray USE ONE SPRAY(S) IN EACH NOSTRIL ONCE DAILY 16 g 1 04/18/2017 at Unknown time  . pantoprazole (PROTONIX) 40 MG tablet TAKE ONE TABLET BY MOUTH TWICE DAILY 180 tablet 0   . potassium chloride (K-DUR) 10 MEQ  tablet Take 1 tablet (10 mEq total) by mouth 2 (two) times daily. (Kelly Krause not taking: Reported on 09/26/2017) 6 tablet 0 Not Taking at Unknown time  . predniSONE (DELTASONE) 10 MG tablet Taper:  6 tablets today, 5 tablets tomorrow, 4 tablets next day, 3 tablets next day, 2 tablets next day, 1 tablet on last day   Completed Course at Unknown time  . sucralfate (CARAFATE) 1 GM/10ML suspension Take by mouth. Cancer Doc   Taking  . vitamin B-12 (CYANOCOBALAMIN) 1000 MCG tablet Take 1,000 mcg by mouth daily. otc   09/20/2017     Allergies  Allergen Reactions  .  Amoxicillin-Pot Clavulanate Itching and Nausea Only  . Codeine Nausea Only and Other (See Comments)    GI Upset headaches  . Demerol [Meperidine] Itching, Nausea Only and Other (See Comments)    GI Upset, Headaches   . Latex Hives and Itching  . Morphine And Related Itching, Nausea Only and Other (See Comments)    headaches  . Oxycodone-Acetaminophen Other (See Comments)  . Strawberry Extract   . Tramadol Other (See Comments)     GI Upset  . Buprenorphine Hcl Itching and Nausea Only    Other reaction(s): Other (See Comments) headaches  . Fentanyl Itching and Nausea And Vomiting  . Tapentadol Rash     Past Medical History:  Diagnosis Date  . Allergy   . Atrial fibrillation ([place])   . Cancer ([place])    esophagus/chemo and rad  . Colon cancer ([place])    chemo/rad 15 yrs  . Colon cancer ([place])   . DDD (degenerative disc disease), cervical   . Dysrhythmia   . Elevated lipids   . GERD (gastroesophageal reflux disease)   . Hyperlipidemia   . Hypertension   . Lung cancer ([place])    chemo  . Lung cancer ([place])   . Major depressive disorder, recurrent episode, moderate ([place])   . Persistent disorder of initiating or maintaining sleep   . Personal history of chemotherapy   . Personal history of radiation therapy   . SVT (supraventricular tachycardia) ([facility])     Review of systems:      Physical Exam    Heart and lungs: Regular rate and rhythm without rub or gallop, lungs are bilaterally clear.    HEENT: Normocephalic atraumatic eyes are anicteric    Other:     Pertinant exam for procedure: Soft nontender nondistended bowel sounds positive normoactive.    Planned proceedures: Colonoscopy and indicated procedures. I have discussed the risks benefits and complications of procedures to include not limited to bleeding, infection, perforation and the risk of sedation and the Kelly Krause wishes to proceed.    Kelly Sails, MD Gastroenterology 09/26/2017  11:05 AM

## 2017-09-26 NOTE — Anesthesia Postprocedure Evaluation (Signed)
Anesthesia Post Note  Patient: Kelly Krause  Procedure(s) Performed: COLONOSCOPY WITH PROPOFOL (N/A )  Patient location during evaluation: PACU Anesthesia Type: General Level of consciousness: awake Pain management: pain level controlled Vital Signs Assessment: post-procedure vital signs reviewed and stable Respiratory status: spontaneous breathing Cardiovascular status: stable Anesthetic complications: no     Last Vitals:  Vitals:   09/26/17 1200  BP: 132/65  Pulse: (!) 57  Resp: 15  Temp: (!) 35.8 C  SpO2: 100%    Last Pain:  Vitals:   09/26/17 1200  TempSrc: Tympanic                 VAN STAVEREN,Gwenna Fuston

## 2017-09-26 NOTE — Anesthesia Post-op Follow-up Note (Signed)
Anesthesia QCDR form completed.        

## 2017-09-26 NOTE — Transfer of Care (Signed)
Immediate Anesthesia Transfer of Care Note  Patient: Kelly Krause  Procedure(s) Performed: COLONOSCOPY WITH PROPOFOL (N/A )  Patient Location: PACU  Anesthesia Type:General  Level of Consciousness: awake and alert   Airway & Oxygen Therapy: Patient Spontanous Breathing and Patient connected to nasal cannula oxygen  Post-op Assessment: Report given to RN and Post -op Vital signs reviewed and stable  Post vital signs: Reviewed  Last Vitals: There were no vitals filed for this visit.  Last Pain: There were no vitals filed for this visit.       Complications: No apparent anesthesia complications

## 2017-09-26 NOTE — Op Note (Signed)
[facility] Patient Name: Kelly Krause Procedure Date: 09/26/2017 11:07 AM MRN: [id number] Account #: [id number] Date of Birth: November 25, 1940 Admit Type: Outpatient Age: 76 Room: [facility] ENDO ROOM 3 Gender: Female Note Status: Finalized Procedure:            Colonoscopy Indications:          Personal history of malignant neoplasm of the colon,                        Personal history of colonic polyps Providers:            Lollie Sails, MD Referring MD:         Juluis Rainier (Referring MD) Medicines:            Monitored Anesthesia Care Complications:        No immediate complications. Procedure:            Pre-Anesthesia Assessment:                       - ASA Grade Assessment: III - A patient with severe                        systemic disease.                       After obtaining informed consent, the colonoscope was                        passed under direct vision. Throughout the procedure,                        the patient's blood pressure, pulse, and oxygen                        saturations were monitored continuously. The                        Colonoscope was introduced through the anus and                        advanced to the the cecum, identified by appendiceal                        orifice and ileocecal valve. The colonoscopy was                        performed without difficulty. The patient tolerated the                        procedure well. The quality of the bowel preparation                        was fair. Findings:      Multiple small-mouthed diverticula were found in the sigmoid colon and       descending colon.      A 6 mm polyp was found in the distal transverse colon. The polyp was       sessile. The polyp was removed with a cold snare. Resection and       retrieval were complete.      Three sessile polyps were found in the  proximal ascending colon. The       polyps were 2 to 4 mm in size. These polyps were  removed with a cold       biopsy forceps. Resection and retrieval were complete.      A fungating non-obstructing medium-sized mass was found in the cecum.       The mass was partially circumferential (involving one-third of the lumen       circumference) and about 3 cm longitudinally. No bleeding was present.       Biopsies were taken with a cold forceps for histology.      The retroflexed view of the distal rectum and anal verge was normal and       showed no anal or rectal abnormalities.      Two sessile polyps were found in the descending colon. The polyps were 2       to 3 mm in size. These polyps were removed with a cold biopsy forceps.       Resection and retrieval were complete.      A diffuse area of moderate melanosis was found in the entire colon. Impression:           - Preparation of the colon was fair.                       - Diverticulosis in the sigmoid colon and in the                        descending colon.                       - One 6 mm polyp in the distal transverse colon,                        removed with a cold snare. Resected and retrieved.                       - Three 2 to 4 mm polyps in the proximal ascending                        colon, removed with a cold biopsy forceps. Resected and                        retrieved.                       - Rule out malignancy, tumor in the cecum. Biopsied.                       - The distal rectum and anal verge are normal on                        retroflexion view.                       - Two 2 to 3 mm polyps in the descending colon, removed                        with a cold biopsy forceps. Resected and retrieved. Recommendation:       - Discharge patient to home.                       -  Await pathology results.                       - If the pathology report is malignant, then refer to a                        surgeon. Procedure Code(s):    --- Professional ---                       [phone], Colonoscopy, flexible;  with removal of tumor(s),                        polyp(s), or other lesion(s) by snare technique                       [id number], 63, Colonoscopy, flexible; with biopsy, single                        or multiple Diagnosis Code(s):    --- Professional ---                       D12.3, Benign neoplasm of transverse colon (hepatic                        flexure or splenic flexure)                       D12.2, Benign neoplasm of ascending colon                       D12.4, Benign neoplasm of descending colon                       D49.0, Neoplasm of unspecified behavior of digestive                        system                       Z85.038, Personal history of other malignant neoplasm                        of large intestine                       Z86.010, Personal history of colonic polyps                       K57.30, Diverticulosis of large intestine without                        perforation or abscess without bleeding CPT copyright 2016 American Medical Association. All rights reserved. The codes documented in this report are preliminary and upon coder review may  be revised to meet current compliance requirements. Lollie Sails, MD 09/26/2017 12:04:22 PM This report has been signed electronically. Number of Addenda: 0 Note Initiated On: 09/26/2017 11:07 AM Scope Withdrawal Time: 0 hours 17 minutes 35 seconds  Total Procedure Duration: 0 hours 40 minutes 0 seconds       [facility]

## 2017-09-27 ENCOUNTER — Encounter: Payer: Self-pay | Admitting: Gastroenterology

## 2017-09-27 ENCOUNTER — Other Ambulatory Visit: Payer: Self-pay | Admitting: Pathology

## 2017-09-27 DIAGNOSIS — R69 Illness, unspecified: Secondary | ICD-10-CM | POA: Diagnosis not present

## 2017-09-27 LAB — SURGICAL PATHOLOGY

## 2017-09-28 ENCOUNTER — Other Ambulatory Visit: Payer: Self-pay

## 2017-09-28 ENCOUNTER — Telehealth: Payer: Self-pay | Admitting: Hematology and Oncology

## 2017-09-28 NOTE — Telephone Encounter (Signed)
Left pt updated appt details per request from [place].

## 2017-09-29 ENCOUNTER — Ambulatory Visit: Payer: Self-pay | Admitting: General Surgery

## 2017-09-29 DIAGNOSIS — C182 Malignant neoplasm of ascending colon: Secondary | ICD-10-CM | POA: Diagnosis not present

## 2017-09-29 DIAGNOSIS — I10 Essential (primary) hypertension: Secondary | ICD-10-CM | POA: Diagnosis not present

## 2017-09-29 DIAGNOSIS — Z8501 Personal history of malignant neoplasm of esophagus: Secondary | ICD-10-CM | POA: Diagnosis not present

## 2017-09-29 DIAGNOSIS — Z902 Acquired absence of lung [part of]: Secondary | ICD-10-CM | POA: Diagnosis not present

## 2017-09-29 DIAGNOSIS — I4891 Unspecified atrial fibrillation: Secondary | ICD-10-CM | POA: Diagnosis not present

## 2017-09-29 DIAGNOSIS — Z85038 Personal history of other malignant neoplasm of large intestine: Secondary | ICD-10-CM | POA: Diagnosis not present

## 2017-09-29 NOTE — H&P (Signed)
PATIENT PROFILE: Kelly Krause is a 76 y.o. female who presents to the Clinic for consultation at the request of Dr. Gustavo Lah for evaluation of malignancy of the ascending large intestine.  PCP:  Sallee Lange, NP  HISTORY OF PRESENT ILLNESS: Kelly Krause comes to the clinic with the chief complain of newly diagnosed colon cancer. The patient refers that she had a surveillance colonoscopy as usual and it was found with a cecum mass that the pathology showed Invasive Colon Adenocarcinoma. The patient denies rectal bleeding. Patient do complain of some abdominal pain but it was associated when she is severely constipated. Denies abdominal distention. Patient refers needs daily stool softener to be able to go to the bathroom. Previous colonoscopy ~3 years ago showed no mass on the cecum. Patient has had adequate follow up with Oncologist and GI. From the Stage IV colon cancer diagnosed on 1999 she recovered properly from lung and colon surgery and no sign of recurrence was found with normal CEA levels done every year, and serial colonoscopies done (previous one 3 years ago). Regarding the Esophageal SCC diagnosed on 2015 and receiving chemo and radio therapy patient has been diease free since completion of the therapy with serial Upper endoscopy showing benign stricture most likely from radiotherapy. Patient has been followed due to the dysphagia which usually resolve with dilation. At the moment of my evaluation patient denies any symptom of dysphagia and has been eating well this days. Patient denies shortness of breath, chest pain or palpitations.    PROBLEM LIST:         Problem List  Date Reviewed: 08/31/2017         Noted   GERD (gastroesophageal reflux disease) Unknown   Overview    Severe sx s/p XRT.  Has required multiple dilations 2/2 dysphagia & still w/ recurrent sx.      Hyperlipidemia, unspecified Unknown   Depression, unspecified Unknown   Allergic rhinitis  Unknown   Cervical spondylosis 03/20/2017   Overview    Severe mid to lower cervical degenerative spodylosis most pronounced at C5-6 & C6-7.      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  s/p chemo & XRT.  Declined surgery.      Colon carcinoma metastatic to lung (CMS-HCC) 11/21/1997   Overview    stage IV colon cancer.  s/p chemo.  Rt lobectomy, but no colon surgery.         GENERAL REVIEW OF SYSTEMS:   General ROS: negative for - chills, fatigue, fever, weight gain or weight loss Allergy and Immunology ROS: negative for - hives  Hematological and Lymphatic ROS: negative for - bleeding problems or bruising, negative for palpable nodes Endocrine ROS: negative for - heat or cold intolerance, hair changes Respiratory ROS: negative for - cough, shortness of breath or wheezing Cardiovascular ROS: no chest pain or palpitations GI ROS: negative for nausea, vomiting, abdominal pain, diarrhea, constipation Musculoskeletal ROS: negative for - joint swelling or muscle pain Neurological ROS: negative for - confusion, syncope Dermatological ROS: negative for pruritus and rash  MEDICATIONS: CurrentMedications        Current Outpatient Medications  Medication Sig Dispense Refill  .  amLODIPine (NORVASC) 10 MG tablet Take 1 tablet (10 mg total) by mouth once daily. 90 tablet 1  . aspirin 81 MG EC tablet Take 81 mg by mouth once daily.      . biotin 300 mcg Tab Take 1 tablet by mouth once daily.      Marland Kitchen buPROPion (WELLBUTRIN SR) 150 MG SR tablet Take 150 mg by mouth 2 (two)  times daily.      . fluticasone (FLONASE) 50 mcg/actuation nasal spray     . gabapentin (NEURONTIN) 100 MG capsule Take 1 capsule (100 mg total) by mouth nightly. 30 capsule 1  . hydroCHLOROthiazide (HYDRODIURIL) 12.5 MG tablet Take 1 tablet (12.5 mg total) by mouth once daily. 90 tablet 1  . pantoprazole (PROTONIX) 40 MG DR tablet Take 1 tablet (40 mg total) by mouth 2 (two) times daily. 180 tablet 1  . polyethylene glycol (MIRALAX) powder As directed for colonic prep. 255 g 0  . potassium chloride (KLOR-CON) 10 MEQ ER tablet Take 10 mEq by mouth once daily.      . ramipril (ALTACE) 10 MG capsule Take 10 mg by mouth once daily.    . sertraline (ZOLOFT) 25 MG tablet Take 1 tablet (25 mg total) by mouth once daily. 90 tablet 1  . simvastatin (ZOCOR) 40 MG tablet Take 1 tablet (40 mg total) by mouth nightly. 90 tablet 1  . sotalol (BETAPACE) 80 MG tablet Take 1 tablet (80 mg total) by mouth 2 (two) times daily. 180 tablet 1  . tiZANidine (ZANAFLEX) 2 MG tablet 1-2 po bid prn 90 tablet 2   No current facility-administered medications for this visit.       ALLERGIES: Codeine; Latex; Meperidine; Tramadol; Augmentin [amoxicillin-pot clavulanate]; Buprenorphine hcl; Fentanyl; Morphine; Nucynta [tapentadol]; Penicillin; Percocet [oxycodone-acetaminophen]; and Strawberry extract  PAST MEDICAL HISTORY:     Past Medical History:  Diagnosis Date  . Allergic rhinitis   . Atrial fibrillation , unspecified (CMS-HCC)    vs paroxsymal afib.  Controlled on sotalol.  No anticoagulation due to low CHADS score.  Followed by Dr. Ubaldo Glassing.  . Cervical spondylosis 03/20/2017   Severe mid to lower cervical degenerative spodylosis most pronounced at C5-6 & C6-7.  Marland Kitchen Colon carcinoma metastatic to lung (CMS-HCC) 1999   stage IV colon cancer.  s/p chemo.  Rt lobectomy.  . Colon polyp 10/14/14   Tubular adenoma  . DDD (degenerative disc disease), lumbar 11/05/2014  . Depression, unspecified    . Diverticulosis 10/14/14  . Esophageal ulceration 07/08/14  . Gastric polyp 04/21/2015  . Gastritis 04/21/2015  . GERD (gastroesophageal reflux disease)    Severe sx s/p XRT.  Has required multiple dilations 2/2 dysphagia & still w/ recurrent sx.  . Hallux valgus (acquired)   . History of chicken pox   . History of TIA (transient ischemic attack)   . HTN (hypertension)   . Hyperkeratosis 07/08/14  . Hyperlipidemia, unspecified   . Lumbar radiculitis 11/05/2014  . Other hammer toe (acquired)   . Spondylosis of lumbar region without myelopathy or radiculopathy 11/03/2014  . Squamous cell carcinoma of esophagus , unspecified (CMS-HCC) 02/2014   stage IB esophageal cancer.  s/p chemo & XRT.  Declined surgery.  . SVT (supraventricular tachycardia) (CMS-HCC)     PAST SURGICAL HISTORY:      Past Surgical History:  Procedure Laterality Date  . Bunionectomy    . COLON SURGERY  1998  . COLONOSCOPY  04/25/06; 07/22/09; 07/07/14  . COLONOSCOPY  10/14/14   +  Tubular Adenoma/ Repeat 3 years/ [facility]  . EGD  03/04/14; 07/08/14   no repeat per mus  . EGD  04/21/2015   Gastric polyp/Gastritis/Repeat to be decided at Office visit in one year (scheduled for 04/20/2016 at 8:30am)/MUS  . EGD  05/17/2016   esophageal stenosis (dilated), a few non-bleeding erosions in the middle third of the esophagus (biopsied). Z-line irregular, 35 cm from the incisors. Chromoscopy was performed in the entire esophagus. Destruction of remaining portion of lesion in the middle third of the esophagus with argon plasma coagulation (APC) was performed.  . EGD  07/19/2016   benign-appearing esophageal stenosis. There were a few non-bleeding erosions in the middle third of the esophagus. There was nodular heterogeneously stained areas in the esophagus (biopsied) and treated with argon plasma coagulation (APC). Z-line regular, 37 cm from the incisors.  . EGD  08/08/2017  . FUNCTIONAL ENDOSCOPIC SINUS  SURGERY  1995  . HALLUX VALGUS CORRECTION    . HYSTERECTOMY  1990   Transvaginal  . PUBOVAGINAL SLING SYNTHETIC  2002  . THORACOSCOPY WITH WEDGE RESECTION LUNG  1998   Rt upper lobe     FAMILY HISTORY:      Family History  Problem Relation Age of Onset  . Stroke Mother   . Breast cancer Mother   . High blood pressure (Hypertension) Mother   . Stroke Father   . Coronary Artery Disease (Blocked arteries around heart) Father   . High blood pressure (Hypertension) Father   . High blood pressure (Hypertension) Sister      SOCIAL HISTORY: Social History        Socioeconomic History  . Marital status: Married    Spouse name: [name]  . Number of children: 3  . Years of education: None  . Highest education level: None  Social Needs  . Financial resource strain: None  . Food insecurity - worry: None  . Food insecurity - inability: None  . Transportation needs - medical: None  . Transportation needs - non-medical: None  Occupational History  . None  Tobacco Use  . Smoking status: Never Smoker  . Smokeless tobacco: Never Used  Substance and Sexual Activity  . Alcohol use: Yes    Alcohol/week: 3.0 oz    Types: 5 Glasses of wine per week  . Drug use: No  . Sexual activity: None  Other Topics Concern  . None  Social History Narrative   3 sons:        Marta Lamas, MD.  Is OB/GYN in [place] are other 2 sons.  1 is a Manufacturing engineer in [place], [place].  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      Spends December thru April renting house in [place].    PHYSICAL EXAM:    Vitals:   09/29/17 0847  BP: 123/61  Pulse: 61  Temp: 36.6 C (97.9 F)   Body mass index is 26.78 kg/m. Weight: 77.6 kg (171 lb)   General Appearance:    Alert, cooperative, no distress, appears stated age  Head:     Atraumatic, normocephalic  Eyes:   Anciteric, no erythema, no  secretions  Neck:   Supple, symmetrical, no JVD, no palpable lymph nodes  Mouth:   Lips, mucosa, and tongue normal;   Lungs:     Clear to auscultation bilaterally, respirations unlabored   Heart:  Regular rate and rhythm, S1 and S2 normal, no murmur, rub   or gallop  Abdomen:     Soft and depressible, non-tender, bowel sounds active all four quadrants, no masses, no organomegaly. Well healed midline scar.   Extremities:  Extremities normal, atraumatic, no cyanosis, pitting edema +1  Skin:   Skin color, texture, turgor normal, no rashes or lesions   Neurologic:   Grossly intact.    REVIEW OF DATA: I have reviewed the following data today:      No visits with results within 3 Month(s) from this visit.  Latest known visit with results is:  Office Visit on 03/03/2015  Component Date Value  . Vent Rate (bpm) 03/03/2015 56   . PR Interval (msec) 03/03/2015 178   . QRS Interval (msec) 03/03/2015 98   . QT Interval (msec) 03/03/2015 484   . QTc (msec) 03/03/2015 467     Colonoscopy report and images were reviewed showing the mass on the cecum close to the ileocecal valve. Other polyps removed  Pathology resolved from the colonoscopy biopsy was reviewed showing multiple tubular adenomas from the descending, transverse and ascending large intestine. The mass shows Invasive Colon Adenocarcinoma.   ASSESSMENT: Kelly Krause is a 76 y.o. female presenting for consultation for malignant neoplasm of the cecum. The patient with a complex oncologic history including Stage IV colon cancer (metastasis to lung) which was treated with partial resection of the colon and lobectomy with adjuvant chemotherapy (1999), which recovered properly, later (2015) developing Esophageal SCC early stage treated with chemo and radiation therapy and has responded properly with serial Upper endoscopies that has not shown recurrence of the disease. Now patient has a new primary malignant neoplasm of the cecum. Proper  staging workup has been ordered. The case was presented on the tumor board and I personally discussed the case with the Oncologist, who agree that will follow the staging workup and if patient has no sign of distant metastasis, will proceed with partial colectomy with ileocolostomy.   The patient was oriented about the Colonoscopy results and the pathology was discussed in details. I explained the implication of having a new primary malignant neoplasm of the cecum and the staging plan with the CT scan of the chest and abdomen that was already ordered by Oncologist. CEA has been under normal levels previously. The patient was also oriented about the surgical alternatives such as Laparoscopic partial colectomy vs open technique. Benefits and risk of both techniques were discussed. The patient was oriented about the complications of small bowel injury, injury to the ureters, anastomosis leak and/or small bowel obstruction. Patient was oriented about the pre operative and post operative goals.    PLAN: 1. Perform chest and abdominopelvic CT scan as ordered 2. Follow up with Oncologist  3. CBC, CMP, PT/PTT/INR, EKG  4. Internal Medicine clearance. 5. If negative for distant disease, will coordinate for Laparoscopic partial colectomy of the ascending colon. [id number]             -Take bowel preparation as ordered             -Follow pre admission instructions for coordination.  Patient and her husband verbalized understanding, all questions were answered, and were agreeable with the plan outlined above.   Herbert Pun, MD  Electronically signed by Herbert Pun, MD

## 2017-10-02 DIAGNOSIS — E782 Mixed hyperlipidemia: Secondary | ICD-10-CM | POA: Diagnosis not present

## 2017-10-02 DIAGNOSIS — I10 Essential (primary) hypertension: Secondary | ICD-10-CM | POA: Diagnosis not present

## 2017-10-02 DIAGNOSIS — Z01818 Encounter for other preprocedural examination: Secondary | ICD-10-CM | POA: Diagnosis not present

## 2017-10-02 DIAGNOSIS — Z79899 Other long term (current) drug therapy: Secondary | ICD-10-CM | POA: Diagnosis not present

## 2017-10-03 ENCOUNTER — Ambulatory Visit: Admission: RE | Admit: 2017-10-03 | Payer: Medicare HMO | Source: Ambulatory Visit

## 2017-10-03 ENCOUNTER — Inpatient Hospital Stay: Payer: Medicare HMO | Attending: Hematology and Oncology

## 2017-10-03 ENCOUNTER — Ambulatory Visit
Admission: RE | Admit: 2017-10-03 | Discharge: 2017-10-03 | Disposition: A | Discharge status: Home / Self Care | Payer: Medicare HMO | Source: Ambulatory Visit | Attending: Hematology and Oncology | Admitting: Hematology and Oncology

## 2017-10-03 ENCOUNTER — Telehealth: Payer: Self-pay | Admitting: Radiology

## 2017-10-03 ENCOUNTER — Inpatient Hospital Stay: Payer: Medicare HMO

## 2017-10-03 DIAGNOSIS — Z8501 Personal history of malignant neoplasm of esophagus: Secondary | ICD-10-CM | POA: Diagnosis not present

## 2017-10-03 DIAGNOSIS — C159 Malignant neoplasm of esophagus, unspecified: Secondary | ICD-10-CM | POA: Diagnosis not present

## 2017-10-03 DIAGNOSIS — K222 Esophageal obstruction: Secondary | ICD-10-CM | POA: Diagnosis not present

## 2017-10-03 DIAGNOSIS — M5030 Other cervical disc degeneration, unspecified cervical region: Secondary | ICD-10-CM | POA: Insufficient documentation

## 2017-10-03 DIAGNOSIS — E876 Hypokalemia: Secondary | ICD-10-CM | POA: Insufficient documentation

## 2017-10-03 DIAGNOSIS — K449 Diaphragmatic hernia without obstruction or gangrene: Secondary | ICD-10-CM | POA: Insufficient documentation

## 2017-10-03 DIAGNOSIS — C186 Malignant neoplasm of descending colon: Secondary | ICD-10-CM | POA: Diagnosis not present

## 2017-10-03 DIAGNOSIS — Z87891 Personal history of nicotine dependence: Secondary | ICD-10-CM | POA: Diagnosis not present

## 2017-10-03 DIAGNOSIS — Z86010 Personal history of colonic polyps: Secondary | ICD-10-CM | POA: Diagnosis not present

## 2017-10-03 DIAGNOSIS — D649 Anemia, unspecified: Secondary | ICD-10-CM | POA: Diagnosis not present

## 2017-10-03 DIAGNOSIS — I2510 Atherosclerotic heart disease of native coronary artery without angina pectoris: Secondary | ICD-10-CM | POA: Insufficient documentation

## 2017-10-03 DIAGNOSIS — R5383 Other fatigue: Secondary | ICD-10-CM | POA: Diagnosis not present

## 2017-10-03 DIAGNOSIS — C189 Malignant neoplasm of colon, unspecified: Secondary | ICD-10-CM | POA: Diagnosis not present

## 2017-10-03 DIAGNOSIS — Z79899 Other long term (current) drug therapy: Secondary | ICD-10-CM | POA: Diagnosis not present

## 2017-10-03 DIAGNOSIS — I700 Atherosclerosis of aorta: Secondary | ICD-10-CM | POA: Diagnosis not present

## 2017-10-03 DIAGNOSIS — Z923 Personal history of irradiation: Secondary | ICD-10-CM | POA: Diagnosis not present

## 2017-10-03 DIAGNOSIS — C180 Malignant neoplasm of cecum: Secondary | ICD-10-CM | POA: Insufficient documentation

## 2017-10-03 DIAGNOSIS — I10 Essential (primary) hypertension: Secondary | ICD-10-CM | POA: Diagnosis not present

## 2017-10-03 DIAGNOSIS — I471 Supraventricular tachycardia: Secondary | ICD-10-CM | POA: Insufficient documentation

## 2017-10-03 DIAGNOSIS — Z7982 Long term (current) use of aspirin: Secondary | ICD-10-CM | POA: Insufficient documentation

## 2017-10-03 DIAGNOSIS — Z803 Family history of malignant neoplasm of breast: Secondary | ICD-10-CM | POA: Insufficient documentation

## 2017-10-03 DIAGNOSIS — Z85038 Personal history of other malignant neoplasm of large intestine: Secondary | ICD-10-CM | POA: Diagnosis not present

## 2017-10-03 DIAGNOSIS — Z9221 Personal history of antineoplastic chemotherapy: Secondary | ICD-10-CM | POA: Insufficient documentation

## 2017-10-03 DIAGNOSIS — E785 Hyperlipidemia, unspecified: Secondary | ICD-10-CM | POA: Diagnosis not present

## 2017-10-03 DIAGNOSIS — I4891 Unspecified atrial fibrillation: Secondary | ICD-10-CM | POA: Insufficient documentation

## 2017-10-03 DIAGNOSIS — K219 Gastro-esophageal reflux disease without esophagitis: Secondary | ICD-10-CM | POA: Diagnosis not present

## 2017-10-03 LAB — CBC WITH DIFFERENTIAL/PLATELET
Basophils Absolute: 0.1 10*3/uL (ref 0–0.1)
Basophils Relative: 2 %
Eosinophils Absolute: 0.3 10*3/uL (ref 0–0.7)
Eosinophils Relative: 6 %
HCT: 29.6 % — ABNORMAL LOW (ref 35.0–47.0)
Hemoglobin: 9.7 g/dL — ABNORMAL LOW (ref 12.0–16.0)
Lymphocytes Relative: 37 %
Lymphs Abs: 1.6 10*3/uL (ref 1.0–3.6)
MCH: 27.1 pg (ref 26.0–34.0)
MCHC: 32.6 g/dL (ref 32.0–36.0)
MCV: 83.1 fL (ref 80.0–100.0)
Monocytes Absolute: 0.4 10*3/uL (ref 0.2–0.9)
Monocytes Relative: 9 %
Neutro Abs: 1.9 10*3/uL (ref 1.4–6.5)
Neutrophils Relative %: 46 %
Platelets: 372 10*3/uL (ref 150–440)
RBC: 3.56 MIL/uL — ABNORMAL LOW (ref 3.80–5.20)
RDW: 16.8 % — ABNORMAL HIGH (ref 11.5–14.5)
WBC: 4.2 10*3/uL (ref 3.6–11.0)

## 2017-10-03 LAB — COMPREHENSIVE METABOLIC PANEL
ALT: 16 U/L (ref 14–54)
AST: 25 U/L (ref 15–41)
Albumin: 3.8 g/dL (ref 3.5–5.0)
Alkaline Phosphatase: 63 U/L (ref 38–126)
Anion gap: 7 (ref 5–15)
BUN: 17 mg/dL (ref 6–20)
CO2: 28 mmol/L (ref 22–32)
Calcium: 8.5 mg/dL — ABNORMAL LOW (ref 8.9–10.3)
Chloride: 99 mmol/L — ABNORMAL LOW (ref 101–111)
Creatinine, Ser: 0.9 mg/dL (ref 0.44–1.00)
GFR calc Af Amer: >60 mL/min (ref >60)
GFR calc non Af Amer: >60 mL/min (ref >60)
Glucose, Bld: 105 mg/dL — ABNORMAL HIGH (ref 65–99)
Potassium: 3.7 mmol/L (ref 3.5–5.1)
Sodium: 134 mmol/L — ABNORMAL LOW (ref 135–145)
Total Bilirubin: 0.6 mg/dL (ref 0.3–1.2)
Total Protein: 6.9 g/dL (ref 6.5–8.1)

## 2017-10-03 MED ORDER — IOPAMIDOL (ISOVUE-300) INJECTION 61%: 100.0000 mL | Freq: Once | INTRAVENOUS | Status: DC | PRN

## 2017-10-03 MED ORDER — SODIUM CHLORIDE 0.9% FLUSH
10.0000 mL | INTRAVENOUS | Status: DC | PRN
  Administered 2017-10-03: 10 mL via INTRAVENOUS
  Filled 2017-10-03: qty 10

## 2017-10-03 MED ORDER — HEPARIN SOD (PORK) LOCK FLUSH 100 UNIT/ML IV SOLN
500.0000 [IU] | Freq: Once | INTRAVENOUS | Status: AC
  Administered 2017-10-03: 500 [IU] via INTRAVENOUS

## 2017-10-03 MED ORDER — IOPAMIDOL (ISOVUE-300) INJECTION 61%
50.0000 mL | Freq: Once | INTRAVENOUS | Status: AC | PRN
  Administered 2017-10-03: 100 mL via INTRAVENOUS

## 2017-10-04 ENCOUNTER — Inpatient Hospital Stay: Payer: Medicare HMO

## 2017-10-04 ENCOUNTER — Inpatient Hospital Stay (HOSPITAL_BASED_OUTPATIENT_CLINIC_OR_DEPARTMENT_OTHER): Payer: Medicare HMO | Admitting: Hematology and Oncology

## 2017-10-04 ENCOUNTER — Encounter: Payer: Self-pay | Admitting: Hematology and Oncology

## 2017-10-04 ENCOUNTER — Ambulatory Visit
Admission: RE | Admit: 2017-10-04 | Discharge: 2017-10-04 | Disposition: A | Discharge status: Home / Self Care | Payer: Medicare HMO | Source: Ambulatory Visit | Attending: Urgent Care | Admitting: Urgent Care

## 2017-10-04 VITALS — BP 144/84 | HR 67 | Temp 97.1°F | Resp 18 | Wt 175.3 lb

## 2017-10-04 DIAGNOSIS — Z923 Personal history of irradiation: Secondary | ICD-10-CM

## 2017-10-04 DIAGNOSIS — K449 Diaphragmatic hernia without obstruction or gangrene: Secondary | ICD-10-CM | POA: Diagnosis not present

## 2017-10-04 DIAGNOSIS — K219 Gastro-esophageal reflux disease without esophagitis: Secondary | ICD-10-CM

## 2017-10-04 DIAGNOSIS — Z85038 Personal history of other malignant neoplasm of large intestine: Secondary | ICD-10-CM

## 2017-10-04 DIAGNOSIS — I4891 Unspecified atrial fibrillation: Secondary | ICD-10-CM

## 2017-10-04 DIAGNOSIS — D649 Anemia, unspecified: Secondary | ICD-10-CM | POA: Diagnosis not present

## 2017-10-04 DIAGNOSIS — Z79899 Other long term (current) drug therapy: Secondary | ICD-10-CM

## 2017-10-04 DIAGNOSIS — E876 Hypokalemia: Secondary | ICD-10-CM

## 2017-10-04 DIAGNOSIS — M5030 Other cervical disc degeneration, unspecified cervical region: Secondary | ICD-10-CM

## 2017-10-04 DIAGNOSIS — Z9221 Personal history of antineoplastic chemotherapy: Secondary | ICD-10-CM

## 2017-10-04 DIAGNOSIS — C159 Malignant neoplasm of esophagus, unspecified: Secondary | ICD-10-CM | POA: Diagnosis not present

## 2017-10-04 DIAGNOSIS — R600 Localized edema: Secondary | ICD-10-CM | POA: Diagnosis not present

## 2017-10-04 DIAGNOSIS — R609 Edema, unspecified: Secondary | ICD-10-CM

## 2017-10-04 DIAGNOSIS — E785 Hyperlipidemia, unspecified: Secondary | ICD-10-CM | POA: Diagnosis not present

## 2017-10-04 DIAGNOSIS — R5383 Other fatigue: Secondary | ICD-10-CM

## 2017-10-04 DIAGNOSIS — Z86010 Personal history of colonic polyps: Secondary | ICD-10-CM

## 2017-10-04 DIAGNOSIS — I10 Essential (primary) hypertension: Secondary | ICD-10-CM

## 2017-10-04 DIAGNOSIS — C180 Malignant neoplasm of cecum: Secondary | ICD-10-CM | POA: Diagnosis not present

## 2017-10-04 DIAGNOSIS — Z803 Family history of malignant neoplasm of breast: Secondary | ICD-10-CM

## 2017-10-04 DIAGNOSIS — K222 Esophageal obstruction: Secondary | ICD-10-CM | POA: Diagnosis not present

## 2017-10-04 DIAGNOSIS — Z7982 Long term (current) use of aspirin: Secondary | ICD-10-CM

## 2017-10-04 DIAGNOSIS — C186 Malignant neoplasm of descending colon: Secondary | ICD-10-CM

## 2017-10-04 DIAGNOSIS — Z87891 Personal history of nicotine dependence: Secondary | ICD-10-CM

## 2017-10-04 DIAGNOSIS — I471 Supraventricular tachycardia: Secondary | ICD-10-CM

## 2017-10-04 DIAGNOSIS — Z8501 Personal history of malignant neoplasm of esophagus: Secondary | ICD-10-CM

## 2017-10-04 LAB — IRON AND TIBC
Iron: 33 ug/dL (ref 28–170)
SATURATION RATIOS: 7 % — AB (ref 10.4–31.8)
TIBC: 465 ug/dL — AB (ref 250–450)
UIBC: 432 ug/dL

## 2017-10-04 LAB — TSH: TSH: 1.162 u[IU]/mL (ref 0.350–4.500)

## 2017-10-04 LAB — FERRITIN: Ferritin: 8 ng/mL — ABNORMAL LOW (ref 11–307)

## 2017-10-04 LAB — FOLATE: Folate: 33 ng/mL (ref >5.9)

## 2017-10-04 LAB — CEA: CEA: 2.7 ng/mL (ref 0.0–4.7)

## 2017-10-04 LAB — VITAMIN B12: Vitamin B-12: 967 pg/mL — ABNORMAL HIGH (ref 180–914)

## 2017-10-04 NOTE — Patient Instructions (Signed)
Obtain FERROUS SULFATE 325mg  tablets over the counter. Take 1-3 tabs a day with VITAMIN C. This will increase your risk of CONSTIPATION. You may use stool softeners to prevent this symptom.

## 2017-10-04 NOTE — Progress Notes (Signed)
[facility] day:  10/04/2017   Chief Complaint: Kelly Krause is a 76 y.o. female with a history of stage IV colon cancer (1999) and stage IB esophageal cancer (2015) who is seen for review of interval procedures and 6 month assessment.  HPI:  The patient was last seen in the medical oncology clinic on 03/22/2017.  At that time, she had recurrent esophageal symptoms.  Exam was stable.  Potassium was low secondary to HCTZ.  CEA was 2.7.  We discussed follow-up with Dr. Gustavo Lah.  EGD on 08/08/2017 revealed a benign appearing esophageal stenosis, dilated.  Esophagus was normal, biopsied, and treated with argon plasma coagulation.  There was a small hiatal hernia.  Colonoscopy on 09/26/2017 revealed a fungating non-obstructing medium-sized mass in the cecum. The mass was partially circumferential (involving one third of the lumen circumference) and measured 3 cm.  There was diverticulosis in the sigmoid colon and in the descending colon.  There was one 6 mm polyp in the distal transverse colon and three 2 - 4 mm polyps in the proximal ascending colon.  Pathology revealed invasive colorectal carcinoma in the cecum.  There were tubular adenomas in the distal transverse, proximal ascending, and descending colon.  There were 2 sessile serrated adenomas in the proximal ascending colon.  Adenomas revealed no high grade dysplasia or malignancy.  She was seen by Dr. Windell Moment of surgery on 09/29/2017.  We discussed moving up her restaging scans scheduled for 10/10/2017.  Discussions were held about laparoscopic right partial colectomy, scheduled for 10/18/2017.  Chest, abdomen, and pelvic CT scan on 10/03/2017 revealed a focal area of increased soft tissue measuring approximately 3 cm within the proximal colon at the level of the cecum consistent with newly diagnosed colon cancer.  There was no evidence for metastatic adenopathy or distant metastatic disease.    Liver was unremarkable.  There was stable appearance of the chest without evidence for recurrent lung cancer or esophageal neoplasm.  Labs on 10/03/2017 revealed a hematocrit of 29.6, hemoglobin 9.7, and MCV 83.1.  CMP and CEA were normal.  Symptomatically, patient has felt tired x 1 month.  She has been going to the gym.  She denies any physical complaints today. She has not experienced any bleeding; no hematochezia, melena, or vaginal bleeding. She denies significant fatigue. Patient continues to eat well, with no demonstrated weight loss. Patient maintains an iron rich diet. She eats meat about 4 times a week, and green leafy vegetables on a daily basis. Patient denies ice pica. Patient denies any B symptoms or interval infections.   Patient's son is an OB/GYN who trained at [facility]. He has insisted that patient been seen for a second surgical opinion by Dr. Cloyd Stagers at [facility]. Patient is scheduled to meet with surgical oncologist (Dr. Cloyd Stagers) on 10/06/2017. She is unsure whether or not she will have the surgery done by Dr. Cloyd Stagers or Dr. Windell Moment at this point.   Past Medical History:  Diagnosis Date  . Allergy   . Atrial fibrillation ([place])   . Cancer ([place])    esophagus/chemo and rad  . Colon cancer ([place])    chemo/rad 15 yrs  . Colon cancer ([place])   . DDD (degenerative disc disease), cervical   . Dysrhythmia   . Elevated lipids   . GERD (gastroesophageal reflux disease)   . Hyperlipidemia   . Hypertension   . Lung cancer ([place])    chemo  . Lung cancer ([place])   .  Major depressive disorder, recurrent episode, moderate ([place])   . Persistent disorder of initiating or maintaining sleep   . Personal history of chemotherapy   . Personal history of radiation therapy   . SVT (supraventricular tachycardia) ([facility])     Past Surgical History:  Procedure Laterality Date  . ABDOMINAL HYSTERECTOMY    . ATRIAL FIBRILLATION ABLATION    . BUNIONECTOMY    . COLECTOMY    . Esophgeal cancer    .  LOBECTOMY Right   . PARTIAL HYSTERECTOMY    . SINUSOTOMY      Family History  Problem Relation Age of Onset  . Stroke Mother   . Depression Mother   . Breast cancer Mother 88  . Breast cancer Maternal Aunt   . Alcohol abuse Father   . Stroke Father   . Hypertension Father     Social History:  reports that she has quit smoking. she has never used smokeless tobacco. She reports that she drinks alcohol. She reports that she does not use drugs.  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  She goes to [employer] [facility] [employer] [facility] from mid December to April 1st.  She lives in [place].  The patient is accompanied by her husband today.  Allergies:  Allergies  Allergen Reactions  . Amoxicillin-Pot Clavulanate Itching, Nausea Only and Other (See Comments)    Has patient had a PCN reaction causing immediate rash, facial/tongue/throat swelling, SOB or lightheadedness with hypotension: No Has patient had a PCN reaction causing severe rash involving mucus membranes or skin necrosis: No Has patient had a PCN reaction that required hospitalization: No Has patient had a PCN reaction occurring within the last 10 years: Yes If all of the above answers are "NO", then may proceed with Cephalosporin use.   . Codeine Nausea Only and Other (See Comments)    GI Upset headaches  . Demerol [Meperidine] Itching, Nausea Only and Other (See Comments)    GI Upset, Headaches   . Latex Hives and Itching  . Morphine And Related Itching, Nausea Only and Other (See Comments)    Headaches, pt can still take   . Oxycodone-Acetaminophen Itching and Nausea Only  . Tramadol Other (See Comments)     GI Upset  . Buprenorphine Hcl Itching, Nausea Only and Other (See Comments)    Headaches  . Fentanyl Itching and Nausea And Vomiting  . Strawberry Extract Nausea And Vomiting and Rash  . Tapentadol Rash    Current Medications: Current Outpatient  Medications  Medication Sig Dispense Refill  . amLODipine (NORVASC) 10 MG tablet TAKE ONE TABLET BY MOUTH ONCE DAILY-NEED  APPOINTMENT (Patient taking differently: Take 10 mg daily by mouth. ) 90 tablet 2  . BIOTIN PO Take 1 tablet by mouth daily.    Marland Kitchen buPROPion (WELLBUTRIN SR) 150 MG 12 hr tablet Take 1 tablet (150 mg total) by mouth 2 (two) times daily. 180 tablet 2  . cetirizine (ZYRTEC) 10 MG tablet Take 1 tablet (10 mg total) by mouth 2 (two) times daily. otc (Patient taking differently: Take 10 mg daily by mouth. ) 90 tablet 3  . Cholecalciferol (VITAMIN D-3) 1000 UNITS CAPS Take 1,000 Units daily by mouth.     . diphenhydrAMINE (BENADRYL) 25 mg capsule Take 50-75 mg every 6 (six) hours as needed by mouth for itching.     . fluticasone (FLONASE) 50 MCG/ACT nasal spray USE ONE SPRAY(S) IN [facility]  NOSTRIL ONCE DAILY (Patient taking differently: Place 2 sprays daily into both nostrils. ) 16 g 11  . hydrochlorothiazide (HYDRODIURIL) 25 MG tablet TAKE ONE TABLET BY MOUTH ONCE DAILY (Patient taking differently: TAKE 12.5 MG BY MOUTH ONCE DAILY) 30 tablet 0  . Multiple Vitamins-Minerals (MULTIVITAMIN PO) Take 1 tablet daily by mouth.    . neomycin-bacitracin-polymyxin (NEOSPORIN) ointment Apply 1 application as needed topically for wound care.    . Omega-3 Fatty Acids (FISH OIL PO) Take 1 capsule daily by mouth.    . pantoprazole (PROTONIX) 40 MG tablet Take 1 tablet (40 mg total) by mouth 2 (two) times daily. 180 tablet 2  . ramipril (ALTACE) 10 MG capsule TAKE ONE CAPSULE BY MOUTH ONCE DAILY. MUST SCHEDULE APPOINTMENT FOR NOVEMBER (Patient taking differently: Take 10 mg daily by mouth. ) 90 capsule 2  . senna-docusate (SENOKOT-S) 8.6-50 MG tablet Take 2 tablets daily by mouth.    . sertraline (ZOLOFT) 25 MG tablet Take 1 tablet (25 mg total) by mouth daily. 90 tablet 2  . simvastatin (ZOCOR) 40 MG tablet Take 1 tablet (40 mg total) by mouth daily. 90 tablet 2  . sotalol (BETAPACE) 80 MG tablet Take 1  tablet (80 mg total) by mouth daily. (Patient taking differently: Take 80 mg 2 (two) times daily by mouth. ) 60 tablet 5  . aspirin EC 81 MG tablet Take 81 mg daily by mouth.    . sucralfate (CARAFATE) 1 GM/10ML suspension Take by mouth. Cancer Doc     No current facility-administered medications for this visit.    Facility-Administered Medications Ordered in Other Visits  Medication Dose Route Frequency Provider Last Rate Last Dose  . 0.9 %  sodium chloride infusion   Intravenous Continuous Evlyn Kanner, NP 999 mL/hr at 07/20/15 1146    . heparin lock flush 100 unit/mL  500 Units Intravenous Once Choksi, Delorise Shiner, MD      . heparin lock flush 100 unit/mL  500 Units Intravenous Once Corcoran, Melissa C, MD      . sodium chloride 0.9 % injection 10 mL  10 mL Intravenous PRN Forest Gleason, MD   10 mL at 07/20/15 1147  . sodium chloride flush (NS) 0.9 % injection 10 mL  10 mL Intravenous PRN Nolon Stalls C, MD   10 mL at 03/22/17 1235  . sodium chloride flush (NS) 0.9 % injection 10 mL  10 mL Intravenous PRN Lequita Asal, MD        Review of Systems:  GENERAL:  Tired x 1 month.  No fevers or sweats.  Weight up 8 pounds. PERFORMANCE STATUS (ECOG):  1 HEENT:  No visual changes, runny nose, sore throat, mouth sores or tenderness. Lungs: No shortness of breath or cough.  No hemoptysis. Cardiac:  No chest pain, palpitations, orthopnea, or PND. GI:  s/p esophageal dilatation.  Reflux.  No nausea, vomiting, diarrhea, constipation, melena or hematochezia. GU:  No urgency, frequency, dysuria, or hematuria. Musculoskeletal:  Neck problems (chronic).  No joint pain.  No muscle tenderness. Extremities:  No pain or swelling. Skin:  No rashes or skin changes. Neuro:  Sciatic nerve issues.  No headache, numbness or weakness, balance or coordination issues. Endocrine:  No diabetes, thyroid issues, hot flashes or night sweats. Psych:  No mood changes, depression or anxiety. Pain:  No focal  pain. Review of systems:  All other systems reviewed and found to be negative.  Physical Exam: Blood pressure (!) 144/84, pulse 67, temperature (!) 97.1 F (  36.2 C), temperature source Tympanic, resp. rate 18, weight 175 lb 4.3 oz (79.5 kg). GENERAL:  Well developed, well nourished, woman sitting comfortably in the exam room in no acute distress. MENTAL STATUS:  Alert and oriented to person, place and time. HEAD:  Short styled brown hair.  Normocephalic, atraumatic, face symmetric, no Cushingoid features. EYES:  Green rimmed glasses.  Blue eyes.  Pupils equal round and reactive to light and accomodation.  No conjunctivitis or scleral icterus. ENT:  Oropharynx clear without lesion.  Tongue normal. Mucous membranes moist.  RESPIRATORY:  Clear to auscultation without rales, wheezes or rhonchi. CARDIOVASCULAR:  Regular rate and rhythm without murmur, rub or gallop. ABDOMEN:  Soft, non-tender, with active bowel sounds, and no hepatosplenomegaly.  No masses. SKIN:  No rashes, ulcers or lesions. EXTREMITIES: Bilateral tender lower extremity edema (left > right).  No skin discoloration or tenderness.  No palpable cords. LYMPH NODES: No palpable cervical, supraclavicular, axillary or inguinal adenopathy  NEUROLOGICAL: Unremarkable. PSYCH:  Appropriate.   Infusion on 10/03/2017  Component Date Value Ref Range Status  . WBC 10/03/2017 4.2  3.6 - 11.0 K/uL Final  . RBC 10/03/2017 3.56* 3.80 - 5.20 MIL/uL Final  . Hemoglobin 10/03/2017 9.7* 12.0 - 16.0 g/dL Final  . HCT 10/03/2017 29.6* 35.0 - 47.0 % Final  . MCV 10/03/2017 83.1  80.0 - 100.0 fL Final  . MCH 10/03/2017 27.1  26.0 - 34.0 pg Final  . MCHC 10/03/2017 32.6  32.0 - 36.0 g/dL Final  . RDW 10/03/2017 16.8* 11.5 - 14.5 % Final  . Platelets 10/03/2017 372  150 - 440 K/uL Final  . Neutrophils Relative % 10/03/2017 46  % Final  . Neutro Abs 10/03/2017 1.9  1.4 - 6.5 K/uL Final  . Lymphocytes Relative 10/03/2017 37  % Final  . Lymphs Abs  10/03/2017 1.6  1.0 - 3.6 K/uL Final  . Monocytes Relative 10/03/2017 9  % Final  . Monocytes Absolute 10/03/2017 0.4  0.2 - 0.9 K/uL Final  . Eosinophils Relative 10/03/2017 6  % Final  . Eosinophils Absolute 10/03/2017 0.3  0 - 0.7 K/uL Final  . Basophils Relative 10/03/2017 2  % Final  . Basophils Absolute 10/03/2017 0.1  0 - 0.1 K/uL Final  . CEA 10/03/2017 2.7  0.0 - 4.7 ng/mL Final   Comment: (NOTE)       Roche ECLIA methodology       Nonsmokers  <3.9                                     Smokers     <5.6 Performed At: [facility] [place], [place] [id number] Rush Farmer MD SW:[id number]   . Sodium 10/03/2017 134* 135 - 145 mmol/L Final  . Potassium 10/03/2017 3.7  3.5 - 5.1 mmol/L Final  . Chloride 10/03/2017 99* 101 - 111 mmol/L Final  . CO2 10/03/2017 28  22 - 32 mmol/L Final  . Glucose, Bld 10/03/2017 105* 65 - 99 mg/dL Final  . BUN 10/03/2017 17  6 - 20 mg/dL Final  . Creatinine, Ser 10/03/2017 0.90  0.44 - 1.00 mg/dL Final  . Calcium 10/03/2017 8.5* 8.9 - 10.3 mg/dL Final  . Total Protein 10/03/2017 6.9  6.5 - 8.1 g/dL Final  . Albumin 10/03/2017 3.8  3.5 - 5.0 g/dL Final  . AST 10/03/2017 25  15 - 41 U/L Final  . ALT 10/03/2017  16  14 - 54 U/L Final  . Alkaline Phosphatase 10/03/2017 63  38 - 126 U/L Final  . Total Bilirubin 10/03/2017 0.6  0.3 - 1.2 mg/dL Final  . GFR calc non Af Amer 10/03/2017 >60  >60 mL/min Final  . GFR calc Af Amer 10/03/2017 >60  >60 mL/min Final   Comment: (NOTE) The eGFR has been calculated using the CKD EPI equation. This calculation has not been validated in all clinical situations. eGFR's persistently <60 mL/min signify possible Chronic Kidney Disease.   . Anion gap 10/03/2017 7  5 - 15 Final    Assessment:  Kelly Krause is a 76 y.o. female with a history of stage IV colon cancer (1999) and stage IB esophageal cancer (2015).  She has a history of metastatic colon cancer 19 years ago.   She underwent right  lobectomy in [place], [place].  Pathology revealed metastatic colon cancer (no records available).  She underwent colonoscopy  which revealed a lesion in the descending colon.  She describes "a year of weekly chemotherapy".  She has had no evidence of recurrence.  Last colonoscopy was 5 years ago.   CEA has been followed:  2.4 on 09/21/2016, 2.7 on 03/22/2017, and 2.7 on 10/03/2017.  She was diagnosed with [id number] squamous cell carcinoma of the esophagus in 2015.  She presented with dysphagia.  EGD on 03/04/2014 revealed squamous cell carcinoma of the esophagus at 29-30 cm.  PET scan on 03/24/2014 revealed focal hypermetabolism in the distal esophagus, compatible with the patient's known esophageal neoplasm.  There was no evidence for hypermetabolic metastatic disease.   She received concurrent radiation and chemotherapy x 5 weeks beginning 03/2014.  Treatment completed 05/2014.  She declined surgical evaluation.    EGD on 04/21/2015 by Dr. Gustavo Lah at [facility] revealed evidence of previous XRT in the esophagus without stenosis or narrowing.  Esophageal pathology revealed high grade dysplasia at 23 cm, high grade dysplasia at 29-30 cm, and low grade dysplasia at 35 cm.    Upper endoscopy on 05/17/2016 revealed esophageal stenosis (dilated), a few non-bleeding erosions in the middle third of the esophagus (biopsied).  Z-line irregular, 35 cm from the incisors.  Chromoscopy was performed in the entire esophagus.  Destruction of remaining portion of lesion in the middle third of the esophagus with argon plasma coagulation (APC) was performed.  Upper endoscopy on 07/19/2016 revealed benign-appearing esophageal stenosis.  There were a few non-bleeding erosions in the middle third of the esophagus.  There was nodular heterogeneously stained areas in the esophagus (biopsied) and treated with APC.  Upper endoscopy on 10/11/2016 revealed benign appearing esophageal stenosis. There were a few nonbleeding erosions in  the middle third of the esophagus. Chromoscopy of the entire esophagus revealed heterogeneously stained areas.  There was a 3 cm hiatal hernia. There was a single bleeding angiodysplastic lesion in the duodenum treated by argon plasma coagulation.  Upper endoscopy on 08/08/2017 revealed a benign appearing esophageal stenosis, dilated.  Esophagus was normal, biopsied, and treated with argon plasma coagulation.  There was a small hiatal hernia.  Colonoscopy on 09/26/2017 revealed a 3 cm fungating non-obstructing medium-sized mass in the cecum that was partially circumferential (involving one third of the lumen circumference).  Pathology revealed invasive colorectal carcinoma. There was one 6 mm polyp in the distal transverse colon and three 2 - 4 mm polyps in the proximal ascending colon.  Pathology revealed tubular adenomas in the distal transverse, proximal ascending, and descending colon.  There were 2 sessile serrated  adenomas in the proximal ascending colon.  There was no high grade dysplasia or malignancy.  Symptomatically, she has been fatigued x 1 month.  She is scheduled to see surgery at [facility] on 10/06/2017, after which she will decide on where she is going to have her surgery.  Exam is stable.  Hemoglobin 9.7 with a hematocrit of 29.6. MCV 83.1, and platelets 372,000.  Plan: 1.  Labs today:  ferritin, iron studies.  [facility] genetic testing. 2.  Review interval EGD- no evidence of recurrent esophageal cancer. 3.  Review interval colonoscopy- new colon cancer in cecum.  Discuss restaging scans- no evidence of metastatic disease.  Discuss plan for surgery. 4.  Discuss new normocytic anemia.  Discuss likely iron deficiency.  Discuss oral iron versus IV iron. Patient to try oral iron, however she is concerned with potential constipation. She was encouraged to start with Ferrous Sulfate 351m x 1 tabs with Vitamin C, with the goal of increased dose to TID as tolerated. She was encouraged to use stool  softeners PRN.  If unable to tolerate, we will proceed with weekly intravenous iron replacement.  5.  Discuss genetic testing. Patient consented.  Testing sent today.  6.  [facility] ([facility]. [place]ie Dibbles[facility] [place].  Records under her prior name ([place]). 7.  [facility] 8.  STAT venous ultrasound today to assess edema (L>R) in bilateral lower extremities.  9.  Continue port flushes every 6-8 weeks. 10.  RTC after surgery for MD assessment, review of pathology, and discussion regarding direction of therapy.  Patient will need to call for an appointment.   Addendum:  Bilateral lower extremity duplex today revealed no evidence of DVT.   BHonor Loh NP  10/04/2017, 12:36 PM   I saw and evaluated the patient, participating in the key portions of the service and reviewing pertinent diagnostic studies and records.  I reviewed the nurse practitioner's note and agree with the findings and the plan.  The assessment and plan were discussed with the patient.  Additional diagnostic studies of lower extremity duplex are needed to clarify her lower extremity edema.  Multiple questions were asked by the patient and answered.   MNolon Stalls MD 10/04/2017,12:36 PM

## 2017-10-04 NOTE — Progress Notes (Signed)
Patient here today for CT results.  Patient former patient of Dr. Oliva Bustard regarding esophageal cancer.  Patient has new diagnosis of colon cancer and anemia.

## 2017-10-06 ENCOUNTER — Inpatient Hospital Stay: Admission: RE | Admit: 2017-10-06 | Payer: Medicare HMO | Source: Ambulatory Visit

## 2017-10-06 ENCOUNTER — Telehealth: Payer: Self-pay | Admitting: *Deleted

## 2017-10-06 DIAGNOSIS — C189 Malignant neoplasm of colon, unspecified: Secondary | ICD-10-CM | POA: Diagnosis not present

## 2017-10-06 NOTE — Telephone Encounter (Signed)
Patient informed of this aND [place]

## 2017-10-06 NOTE — Telephone Encounter (Signed)
Patient states she went to see [facility] surgeon and "he wants to know when we will be getting the DNA test back" Please return her call [phone]

## 2017-10-06 NOTE — Telephone Encounter (Signed)
Its generally at least 2 weeks. It will likely be delayed further due to the holidays. I will keep my eye out for the results and forward when received.

## 2017-10-09 DIAGNOSIS — C189 Malignant neoplasm of colon, unspecified: Secondary | ICD-10-CM | POA: Diagnosis not present

## 2017-10-10 ENCOUNTER — Other Ambulatory Visit: Payer: Self-pay

## 2017-10-10 ENCOUNTER — Ambulatory Visit: Payer: Medicare HMO

## 2017-10-11 ENCOUNTER — Ambulatory Visit: Payer: Self-pay | Admitting: Hematology and Oncology

## 2017-10-16 ENCOUNTER — Other Ambulatory Visit: Payer: Self-pay | Admitting: Family Medicine

## 2017-10-16 DIAGNOSIS — I10 Essential (primary) hypertension: Secondary | ICD-10-CM

## 2017-10-16 DIAGNOSIS — L821 Other seborrheic keratosis: Secondary | ICD-10-CM | POA: Diagnosis not present

## 2017-10-16 DIAGNOSIS — L570 Actinic keratosis: Secondary | ICD-10-CM | POA: Diagnosis not present

## 2017-10-16 DIAGNOSIS — F3341 Major depressive disorder, recurrent, in partial remission: Secondary | ICD-10-CM

## 2017-10-16 DIAGNOSIS — Z85828 Personal history of other malignant neoplasm of skin: Secondary | ICD-10-CM | POA: Diagnosis not present

## 2017-10-17 DIAGNOSIS — M79651 Pain in right thigh: Secondary | ICD-10-CM | POA: Diagnosis not present

## 2017-10-17 DIAGNOSIS — K219 Gastro-esophageal reflux disease without esophagitis: Secondary | ICD-10-CM | POA: Diagnosis not present

## 2017-10-17 DIAGNOSIS — R69 Illness, unspecified: Secondary | ICD-10-CM | POA: Diagnosis not present

## 2017-10-17 DIAGNOSIS — R062 Wheezing: Secondary | ICD-10-CM | POA: Diagnosis not present

## 2017-10-17 DIAGNOSIS — G8918 Other acute postprocedural pain: Secondary | ICD-10-CM | POA: Diagnosis not present

## 2017-10-17 DIAGNOSIS — M79604 Pain in right leg: Secondary | ICD-10-CM | POA: Diagnosis not present

## 2017-10-17 DIAGNOSIS — M1611 Unilateral primary osteoarthritis, right hip: Secondary | ICD-10-CM | POA: Diagnosis not present

## 2017-10-17 DIAGNOSIS — I447 Left bundle-branch block, unspecified: Secondary | ICD-10-CM | POA: Diagnosis not present

## 2017-10-17 DIAGNOSIS — C182 Malignant neoplasm of ascending colon: Secondary | ICD-10-CM | POA: Diagnosis not present

## 2017-10-17 DIAGNOSIS — M47816 Spondylosis without myelopathy or radiculopathy, lumbar region: Secondary | ICD-10-CM | POA: Diagnosis not present

## 2017-10-17 DIAGNOSIS — I4891 Unspecified atrial fibrillation: Secondary | ICD-10-CM | POA: Diagnosis not present

## 2017-10-17 DIAGNOSIS — R109 Unspecified abdominal pain: Secondary | ICD-10-CM | POA: Diagnosis not present

## 2017-10-17 DIAGNOSIS — C189 Malignant neoplasm of colon, unspecified: Secondary | ICD-10-CM | POA: Diagnosis not present

## 2017-10-17 DIAGNOSIS — J90 Pleural effusion, not elsewhere classified: Secondary | ICD-10-CM | POA: Diagnosis not present

## 2017-10-17 DIAGNOSIS — R9431 Abnormal electrocardiogram [ECG] [EKG]: Secondary | ICD-10-CM | POA: Diagnosis not present

## 2017-10-17 DIAGNOSIS — I10 Essential (primary) hypertension: Secondary | ICD-10-CM | POA: Diagnosis not present

## 2017-10-17 DIAGNOSIS — C180 Malignant neoplasm of cecum: Secondary | ICD-10-CM | POA: Diagnosis not present

## 2017-10-17 DIAGNOSIS — R918 Other nonspecific abnormal finding of lung field: Secondary | ICD-10-CM | POA: Diagnosis not present

## 2017-10-17 DIAGNOSIS — R590 Localized enlarged lymph nodes: Secondary | ICD-10-CM | POA: Diagnosis not present

## 2017-10-17 DIAGNOSIS — E785 Hyperlipidemia, unspecified: Secondary | ICD-10-CM | POA: Diagnosis not present

## 2017-10-17 DIAGNOSIS — I471 Supraventricular tachycardia: Secondary | ICD-10-CM | POA: Diagnosis not present

## 2017-10-17 DIAGNOSIS — J189 Pneumonia, unspecified organism: Secondary | ICD-10-CM | POA: Diagnosis not present

## 2017-10-17 DIAGNOSIS — J9691 Respiratory failure, unspecified with hypoxia: Secondary | ICD-10-CM | POA: Diagnosis not present

## 2017-10-18 ENCOUNTER — Encounter: Admission: RE | Payer: Self-pay | Source: Ambulatory Visit

## 2017-10-18 ENCOUNTER — Inpatient Hospital Stay: Admission: RE | Admit: 2017-10-18 | Payer: Medicare HMO | Source: Ambulatory Visit | Admitting: General Surgery

## 2017-10-18 SURGERY — LAPAROSCOPIC RIGHT COLON RESECTION
Anesthesia: General | Laterality: Right

## 2017-11-01 ENCOUNTER — Other Ambulatory Visit: Payer: Self-pay | Admitting: Family Medicine

## 2017-11-01 DIAGNOSIS — I10 Essential (primary) hypertension: Secondary | ICD-10-CM

## 2017-11-11 DIAGNOSIS — R112 Nausea with vomiting, unspecified: Secondary | ICD-10-CM | POA: Diagnosis not present

## 2017-11-11 DIAGNOSIS — R1084 Generalized abdominal pain: Secondary | ICD-10-CM | POA: Diagnosis not present

## 2017-11-11 DIAGNOSIS — R109 Unspecified abdominal pain: Secondary | ICD-10-CM | POA: Diagnosis not present

## 2017-11-11 DIAGNOSIS — I10 Essential (primary) hypertension: Secondary | ICD-10-CM | POA: Diagnosis not present

## 2017-11-11 DIAGNOSIS — Z87891 Personal history of nicotine dependence: Secondary | ICD-10-CM | POA: Diagnosis not present

## 2018-01-16 ENCOUNTER — Other Ambulatory Visit: Payer: Self-pay

## 2018-03-13 DIAGNOSIS — L570 Actinic keratosis: Secondary | ICD-10-CM | POA: Diagnosis not present

## 2018-03-29 DIAGNOSIS — J301 Allergic rhinitis due to pollen: Secondary | ICD-10-CM | POA: Diagnosis not present

## 2018-03-29 DIAGNOSIS — J0100 Acute maxillary sinusitis, unspecified: Secondary | ICD-10-CM | POA: Diagnosis not present

## 2018-04-21 ENCOUNTER — Other Ambulatory Visit: Payer: Self-pay | Admitting: Family Medicine

## 2018-04-21 DIAGNOSIS — F3341 Major depressive disorder, recurrent, in partial remission: Secondary | ICD-10-CM

## 2018-05-21 ENCOUNTER — Other Ambulatory Visit: Payer: Self-pay | Admitting: Nurse Practitioner

## 2018-05-21 DIAGNOSIS — Z1231 Encounter for screening mammogram for malignant neoplasm of breast: Secondary | ICD-10-CM

## 2018-06-04 ENCOUNTER — Ambulatory Visit
Admission: RE | Admit: 2018-06-04 | Discharge: 2018-06-04 | Disposition: A | Discharge status: Home / Self Care | Payer: Medicare HMO | Source: Ambulatory Visit | Attending: Nurse Practitioner | Admitting: Nurse Practitioner

## 2018-06-04 DIAGNOSIS — Z1231 Encounter for screening mammogram for malignant neoplasm of breast: Secondary | ICD-10-CM | POA: Insufficient documentation

## 2018-07-19 DIAGNOSIS — H2513 Age-related nuclear cataract, bilateral: Secondary | ICD-10-CM | POA: Diagnosis not present

## 2018-07-19 DIAGNOSIS — H524 Presbyopia: Secondary | ICD-10-CM | POA: Diagnosis not present

## 2018-07-24 DIAGNOSIS — K219 Gastro-esophageal reflux disease without esophagitis: Secondary | ICD-10-CM | POA: Diagnosis not present

## 2018-07-24 DIAGNOSIS — K449 Diaphragmatic hernia without obstruction or gangrene: Secondary | ICD-10-CM | POA: Diagnosis not present

## 2018-07-24 DIAGNOSIS — Z85118 Personal history of other malignant neoplasm of bronchus and lung: Secondary | ICD-10-CM | POA: Diagnosis not present

## 2018-07-24 DIAGNOSIS — I4891 Unspecified atrial fibrillation: Secondary | ICD-10-CM | POA: Diagnosis not present

## 2018-07-24 DIAGNOSIS — I471 Supraventricular tachycardia: Secondary | ICD-10-CM | POA: Diagnosis not present

## 2018-07-24 DIAGNOSIS — Z85038 Personal history of other malignant neoplasm of large intestine: Secondary | ICD-10-CM | POA: Diagnosis not present

## 2018-07-24 DIAGNOSIS — I10 Essential (primary) hypertension: Secondary | ICD-10-CM | POA: Diagnosis not present

## 2018-07-24 DIAGNOSIS — C159 Malignant neoplasm of esophagus, unspecified: Secondary | ICD-10-CM | POA: Diagnosis not present

## 2018-07-24 DIAGNOSIS — K222 Esophageal obstruction: Secondary | ICD-10-CM | POA: Diagnosis not present

## 2018-07-24 DIAGNOSIS — Z7982 Long term (current) use of aspirin: Secondary | ICD-10-CM | POA: Diagnosis not present

## 2018-07-24 DIAGNOSIS — E785 Hyperlipidemia, unspecified: Secondary | ICD-10-CM | POA: Diagnosis not present

## 2018-07-24 DIAGNOSIS — K209 Esophagitis, unspecified: Secondary | ICD-10-CM | POA: Diagnosis not present

## 2018-08-07 DIAGNOSIS — E782 Mixed hyperlipidemia: Secondary | ICD-10-CM | POA: Diagnosis not present

## 2018-08-07 DIAGNOSIS — Z79899 Other long term (current) drug therapy: Secondary | ICD-10-CM | POA: Diagnosis not present

## 2018-08-07 DIAGNOSIS — K219 Gastro-esophageal reflux disease without esophagitis: Secondary | ICD-10-CM | POA: Diagnosis not present

## 2018-08-07 DIAGNOSIS — I10 Essential (primary) hypertension: Secondary | ICD-10-CM | POA: Diagnosis not present

## 2018-08-07 DIAGNOSIS — I480 Paroxysmal atrial fibrillation: Secondary | ICD-10-CM | POA: Diagnosis not present

## 2018-08-07 DIAGNOSIS — R69 Illness, unspecified: Secondary | ICD-10-CM | POA: Diagnosis not present

## 2018-08-07 DIAGNOSIS — I471 Supraventricular tachycardia: Secondary | ICD-10-CM | POA: Diagnosis not present

## 2018-08-07 DIAGNOSIS — Z13820 Encounter for screening for osteoporosis: Secondary | ICD-10-CM | POA: Diagnosis not present

## 2018-08-15 DIAGNOSIS — M8588 Other specified disorders of bone density and structure, other site: Secondary | ICD-10-CM | POA: Diagnosis not present

## 2018-08-20 DIAGNOSIS — R69 Illness, unspecified: Secondary | ICD-10-CM | POA: Diagnosis not present

## 2018-09-11 IMAGING — XA Imaging study
2 series · 2 of 2 positions shown · non-contrast
Comparison: none

CLINICAL DATA: Lumbosacral spondylosis without myelopathy with
radiculopathy. Left lower extremity pain and numbness. Good response
to an L4-5 epidural injection performed elsewhere 2 years ago.

[Series 1: ortho standard · 1 of 1 slices shown (1 of 2)]
[im 1/1]
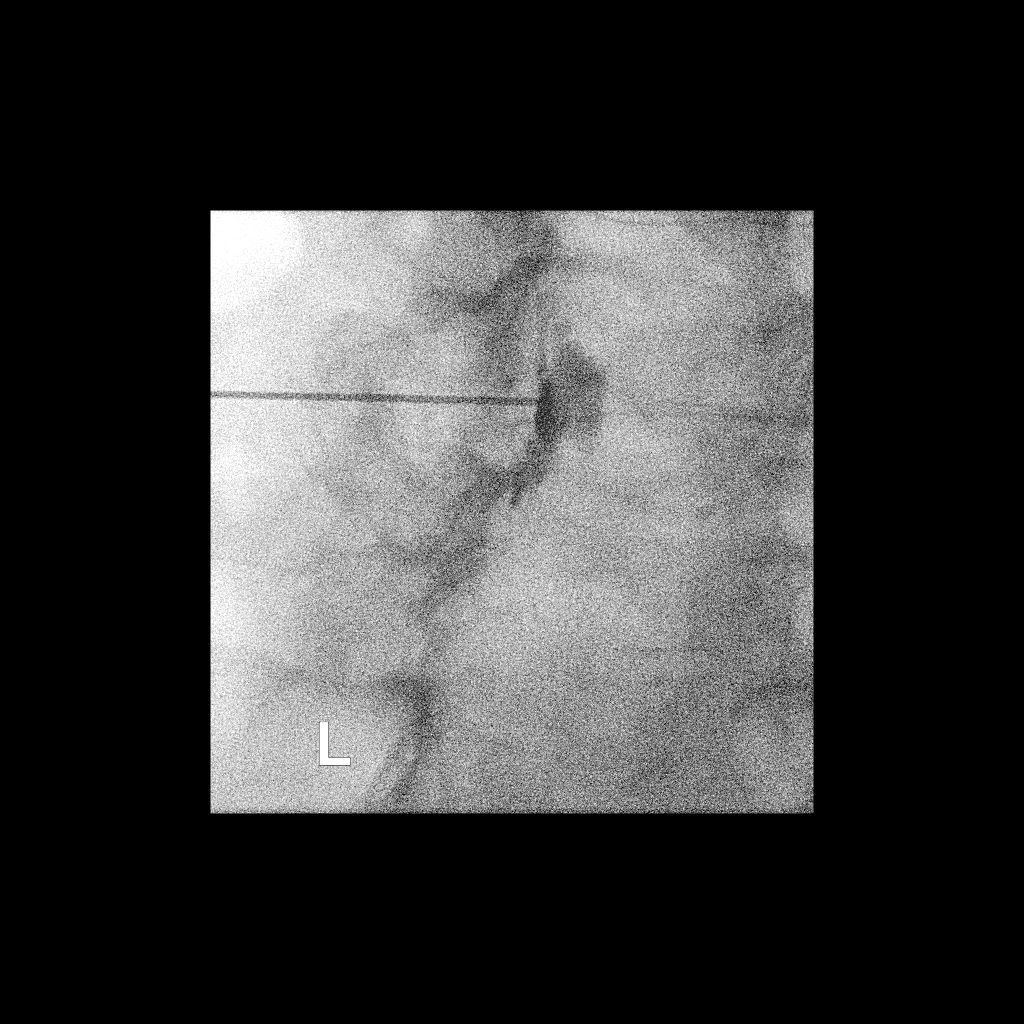

[Series 2: ortho standard · 1 of 1 slices shown (2 of 2)]
[im 1/1]
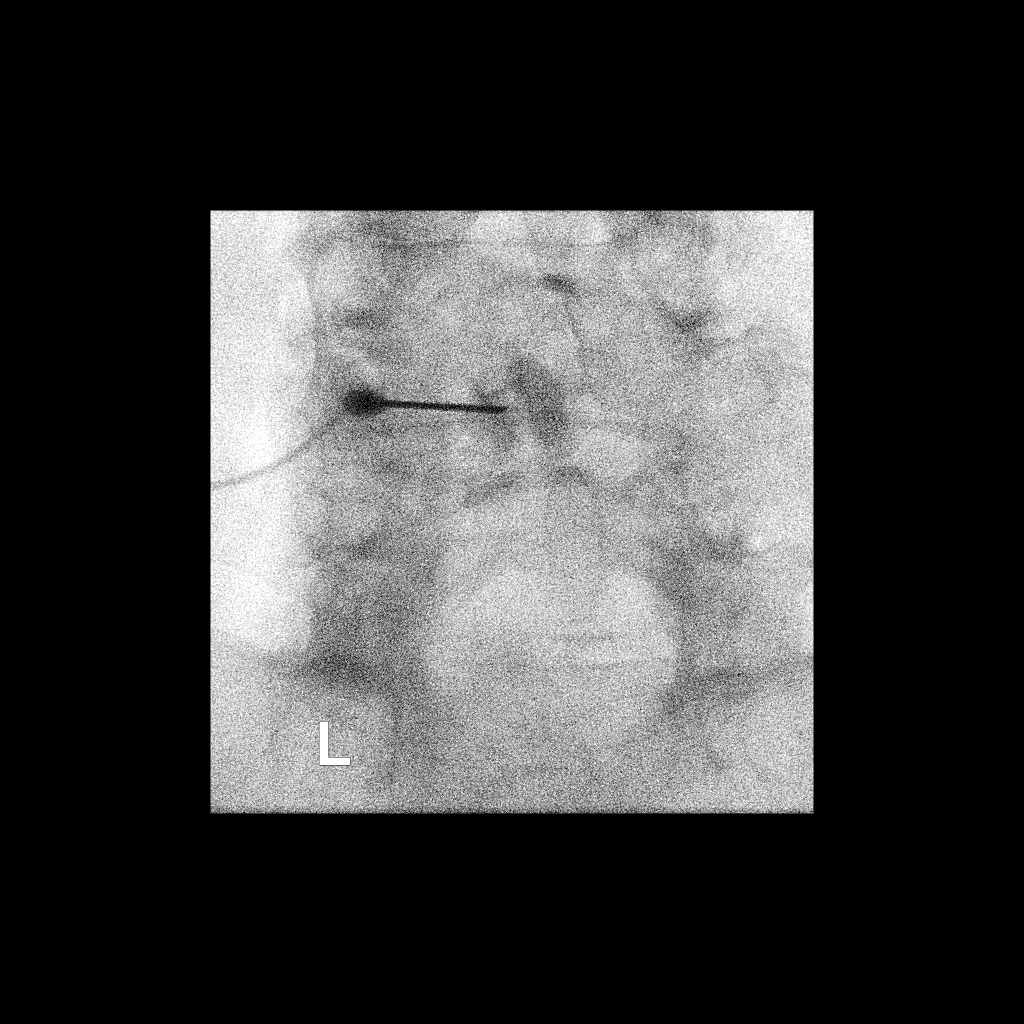

[2 of 2 positions shown; findings below may reference images not displayed]

FLUOROSCOPY TIME:  Radiation Exposure Index (as provided by the
fluoroscopic device): 17.13 microGray*m^2

Fluoroscopy Time (in minutes and seconds):  8 seconds

PROCEDURE:
The procedure, risks, benefits, and alternatives were explained to
the patient. Questions regarding the procedure were encouraged and
answered. The patient understands and consents to the procedure.

LUMBAR EPIDURAL INJECTION:

An interlaminar approach was performed on the left at L4-5. The
overlying skin was cleansed and anesthetized. A 3.5 inch 20 gauge
epidural needle was advanced using loss-of-resistance technique.

DIAGNOSTIC EPIDURAL INJECTION:

Injection of Isovue-M 200 shows a good epidural pattern with spread
above and below the level of needle placement, primarily on the
left. No vascular opacification is seen.

THERAPEUTIC EPIDURAL INJECTION:

120 mg of Depo-Medrol mixed with 3 mL of 1% lidocaine were
instilled. The procedure was well-tolerated, and the patient was
discharged thirty minutes following the injection in good condition.

COMPLICATIONS:
None
IMPRESSION: Technically successful lumbar interlaminar epidural injection on the
left at L4-5.

## 2018-09-17 DIAGNOSIS — H11121 Conjunctival concretions, right eye: Secondary | ICD-10-CM | POA: Diagnosis not present

## 2018-09-24 DIAGNOSIS — L570 Actinic keratosis: Secondary | ICD-10-CM | POA: Diagnosis not present

## 2018-10-24 DIAGNOSIS — Z1283 Encounter for screening for malignant neoplasm of skin: Secondary | ICD-10-CM | POA: Diagnosis not present

## 2018-10-24 DIAGNOSIS — L821 Other seborrheic keratosis: Secondary | ICD-10-CM | POA: Diagnosis not present

## 2018-10-24 DIAGNOSIS — Z872 Personal history of diseases of the skin and subcutaneous tissue: Secondary | ICD-10-CM | POA: Diagnosis not present

## 2018-10-24 DIAGNOSIS — L578 Other skin changes due to chronic exposure to nonionizing radiation: Secondary | ICD-10-CM | POA: Diagnosis not present

## 2018-10-24 DIAGNOSIS — L570 Actinic keratosis: Secondary | ICD-10-CM | POA: Diagnosis not present

## 2018-10-24 DIAGNOSIS — Z85828 Personal history of other malignant neoplasm of skin: Secondary | ICD-10-CM | POA: Diagnosis not present

## 2018-10-24 DIAGNOSIS — D1800 Hemangioma unspecified site: Secondary | ICD-10-CM | POA: Diagnosis not present

## 2018-10-24 DIAGNOSIS — D485 Neoplasm of uncertain behavior of skin: Secondary | ICD-10-CM | POA: Diagnosis not present

## 2018-10-24 DIAGNOSIS — D044 Carcinoma in situ of skin of scalp and neck: Secondary | ICD-10-CM | POA: Diagnosis not present

## 2018-12-13 DIAGNOSIS — D044 Carcinoma in situ of skin of scalp and neck: Secondary | ICD-10-CM | POA: Diagnosis not present

## 2019-01-15 DIAGNOSIS — S1180XA Unspecified open wound of other specified part of neck, initial encounter: Secondary | ICD-10-CM | POA: Diagnosis not present

## 2019-01-15 DIAGNOSIS — D044 Carcinoma in situ of skin of scalp and neck: Secondary | ICD-10-CM | POA: Diagnosis not present

## 2019-01-22 DIAGNOSIS — Z48817 Encounter for surgical aftercare following surgery on the skin and subcutaneous tissue: Secondary | ICD-10-CM | POA: Diagnosis not present

## 2019-01-22 DIAGNOSIS — L578 Other skin changes due to chronic exposure to nonionizing radiation: Secondary | ICD-10-CM | POA: Diagnosis not present

## 2019-01-22 DIAGNOSIS — L820 Inflamed seborrheic keratosis: Secondary | ICD-10-CM | POA: Diagnosis not present

## 2019-01-22 DIAGNOSIS — C44722 Squamous cell carcinoma of skin of right lower limb, including hip: Secondary | ICD-10-CM | POA: Diagnosis not present

## 2019-01-22 DIAGNOSIS — D485 Neoplasm of uncertain behavior of skin: Secondary | ICD-10-CM | POA: Diagnosis not present

## 2019-01-22 DIAGNOSIS — R58 Hemorrhage, not elsewhere classified: Secondary | ICD-10-CM | POA: Diagnosis not present

## 2019-01-22 DIAGNOSIS — L814 Other melanin hyperpigmentation: Secondary | ICD-10-CM | POA: Diagnosis not present

## 2019-04-17 DIAGNOSIS — E782 Mixed hyperlipidemia: Secondary | ICD-10-CM | POA: Diagnosis not present

## 2019-04-17 DIAGNOSIS — I480 Paroxysmal atrial fibrillation: Secondary | ICD-10-CM | POA: Diagnosis not present

## 2019-04-17 DIAGNOSIS — Z79899 Other long term (current) drug therapy: Secondary | ICD-10-CM | POA: Diagnosis not present

## 2019-04-17 DIAGNOSIS — K219 Gastro-esophageal reflux disease without esophagitis: Secondary | ICD-10-CM | POA: Diagnosis not present

## 2019-04-17 DIAGNOSIS — I471 Supraventricular tachycardia: Secondary | ICD-10-CM | POA: Diagnosis not present

## 2019-04-17 DIAGNOSIS — M62830 Muscle spasm of back: Secondary | ICD-10-CM | POA: Diagnosis not present

## 2019-04-17 DIAGNOSIS — I10 Essential (primary) hypertension: Secondary | ICD-10-CM | POA: Diagnosis not present

## 2019-04-17 DIAGNOSIS — R69 Illness, unspecified: Secondary | ICD-10-CM | POA: Diagnosis not present

## 2019-05-24 DIAGNOSIS — Z1159 Encounter for screening for other viral diseases: Secondary | ICD-10-CM | POA: Diagnosis not present

## 2019-05-28 DIAGNOSIS — E785 Hyperlipidemia, unspecified: Secondary | ICD-10-CM | POA: Diagnosis not present

## 2019-05-28 DIAGNOSIS — I10 Essential (primary) hypertension: Secondary | ICD-10-CM | POA: Diagnosis not present

## 2019-05-28 DIAGNOSIS — Z7982 Long term (current) use of aspirin: Secondary | ICD-10-CM | POA: Diagnosis not present

## 2019-05-28 DIAGNOSIS — I4891 Unspecified atrial fibrillation: Secondary | ICD-10-CM | POA: Diagnosis not present

## 2019-05-28 DIAGNOSIS — K200 Eosinophilic esophagitis: Secondary | ICD-10-CM | POA: Diagnosis not present

## 2019-05-28 DIAGNOSIS — C159 Malignant neoplasm of esophagus, unspecified: Secondary | ICD-10-CM | POA: Diagnosis not present

## 2019-05-28 DIAGNOSIS — K208 Other esophagitis: Secondary | ICD-10-CM | POA: Diagnosis not present

## 2019-05-28 DIAGNOSIS — Z87891 Personal history of nicotine dependence: Secondary | ICD-10-CM | POA: Diagnosis not present

## 2019-05-28 DIAGNOSIS — R69 Illness, unspecified: Secondary | ICD-10-CM | POA: Diagnosis not present

## 2019-05-28 DIAGNOSIS — Z8673 Personal history of transient ischemic attack (TIA), and cerebral infarction without residual deficits: Secondary | ICD-10-CM | POA: Diagnosis not present

## 2019-05-28 DIAGNOSIS — K222 Esophageal obstruction: Secondary | ICD-10-CM | POA: Diagnosis not present

## 2019-05-28 DIAGNOSIS — K228 Other specified diseases of esophagus: Secondary | ICD-10-CM | POA: Diagnosis not present

## 2019-05-28 DIAGNOSIS — Z8501 Personal history of malignant neoplasm of esophagus: Secondary | ICD-10-CM | POA: Diagnosis not present

## 2019-06-03 ENCOUNTER — Other Ambulatory Visit: Payer: Self-pay | Admitting: Nurse Practitioner

## 2019-06-03 DIAGNOSIS — Z1231 Encounter for screening mammogram for malignant neoplasm of breast: Secondary | ICD-10-CM

## 2019-06-17 ENCOUNTER — Other Ambulatory Visit: Payer: Self-pay

## 2019-06-17 ENCOUNTER — Encounter (INDEPENDENT_AMBULATORY_CARE_PROVIDER_SITE_OTHER): Payer: Self-pay

## 2019-06-17 ENCOUNTER — Ambulatory Visit
Admission: RE | Admit: 2019-06-17 | Discharge: 2019-06-17 | Disposition: A | Discharge status: Home / Self Care | Payer: Medicare HMO | Source: Ambulatory Visit | Attending: Nurse Practitioner | Admitting: Nurse Practitioner

## 2019-06-17 DIAGNOSIS — Z1231 Encounter for screening mammogram for malignant neoplasm of breast: Secondary | ICD-10-CM | POA: Insufficient documentation

## 2019-06-20 ENCOUNTER — Other Ambulatory Visit: Payer: Self-pay | Admitting: Nurse Practitioner

## 2019-06-20 DIAGNOSIS — R928 Other abnormal and inconclusive findings on diagnostic imaging of breast: Secondary | ICD-10-CM

## 2019-06-20 DIAGNOSIS — N6310 Unspecified lump in the right breast, unspecified quadrant: Secondary | ICD-10-CM

## 2019-06-28 ENCOUNTER — Ambulatory Visit
Admission: RE | Admit: 2019-06-28 | Discharge: 2019-06-28 | Disposition: A | Discharge status: Home / Self Care | Payer: Medicare HMO | Source: Ambulatory Visit | Attending: Nurse Practitioner | Admitting: Nurse Practitioner

## 2019-06-28 DIAGNOSIS — R928 Other abnormal and inconclusive findings on diagnostic imaging of breast: Secondary | ICD-10-CM

## 2019-06-28 DIAGNOSIS — R922 Inconclusive mammogram: Secondary | ICD-10-CM | POA: Diagnosis not present

## 2019-06-28 DIAGNOSIS — N6311 Unspecified lump in the right breast, upper outer quadrant: Secondary | ICD-10-CM | POA: Diagnosis not present

## 2019-06-28 DIAGNOSIS — N6310 Unspecified lump in the right breast, unspecified quadrant: Secondary | ICD-10-CM

## 2019-07-09 ENCOUNTER — Encounter: Payer: Self-pay | Admitting: Emergency Medicine

## 2019-07-09 ENCOUNTER — Ambulatory Visit
Admission: EM | Admit: 2019-07-09 | Discharge: 2019-07-09 | Disposition: A | Discharge status: Home / Self Care | Payer: Medicare HMO | Attending: Urgent Care | Admitting: Urgent Care

## 2019-07-09 ENCOUNTER — Other Ambulatory Visit: Payer: Self-pay

## 2019-07-09 DIAGNOSIS — Y93H2 Activity, gardening and landscaping: Secondary | ICD-10-CM

## 2019-07-09 DIAGNOSIS — S81812A Laceration without foreign body, left lower leg, initial encounter: Secondary | ICD-10-CM

## 2019-07-09 DIAGNOSIS — L03115 Cellulitis of right lower limb: Secondary | ICD-10-CM | POA: Diagnosis not present

## 2019-07-09 MED ORDER — BACITRACIN ZINC 500 UNIT/GM EX OINT
TOPICAL_OINTMENT | Freq: Once | CUTANEOUS | Status: AC
  Administered 2019-07-09: 1 via TOPICAL

## 2019-07-09 MED ORDER — BACITRACIN ZINC 500 UNIT/GM EX OINT: 1.0000 "application " | TOPICAL_OINTMENT | Freq: Two times a day (BID) | CUTANEOUS | 0 refills | Status: DC

## 2019-07-09 MED ORDER — DOXYCYCLINE HYCLATE 100 MG PO CAPS: 100.0000 mg | ORAL_CAPSULE | Freq: Two times a day (BID) | ORAL | 0 refills | Status: DC

## 2019-07-09 MED ORDER — TETANUS-DIPHTH-ACELL PERTUSSIS 5-2.5-18.5 LF-MCG/0.5 IM SUSP
0.5000 mL | Freq: Once | INTRAMUSCULAR | Status: AC
  Administered 2019-07-09: 0.5 mL via INTRAMUSCULAR

## 2019-07-09 NOTE — ED Triage Notes (Signed)
Patient fell x 1 week ago hitting her right lower leg on a rose bush. She states the area is still open and has minimal bleeding.

## 2019-07-09 NOTE — Discharge Instructions (Signed)
It was very nice seeing you today in clinic. Thank you for entrusting me with your care.   Please utilize the medications that we discussed. Your prescriptions have been called in to your pharmacy. Keep area clean and dry. Use topical medication as directed.   Make arrangements to follow up with your regular doctor in 1 week for re-evaluation if not improving. If your symptoms/condition worsens, please seek follow up care either here or in the ER. Please remember, our [place] providers are "right here with you" when you need [place].   Again, it was my pleasure to take care of you today. Thank you for choosing our clinic. I hope that you start to feel better quickly.   Honor Loh, MSN, APRN, FNP-C, CEN Advanced Practice Provider [facility]

## 2019-07-10 NOTE — ED Provider Notes (Signed)
[place], [place]   Name: Kelly Krause DOB: 01/20/1941 MRN: [id number] CSN: [id number] PCP: Sallee Lange, NP  Arrival date and time:  07/09/19 1455  Chief Complaint:  Leg Injury   NOTE: Prior to seeing the patient today, I have reviewed the triage nursing documentation and vital signs. Clinical staff has updated patient's PMH/PSHx, current medication list, and drug allergies/intolerances to ensure comprehensive history available to assist in medical decision making.   History:   HPI: Kelly Krause is a 78 y.o. female who presents today with complaints of trauma to her RIGHT lower leg that occurred about a week ago. Patient describes the injury to have occurred when she ws working in her yard. Patient fell on a rose bush, which resulted in a laceration to the medial aspect of her RIGHT distal tib/fib area. Patient cleansed area immediately and applied antibiotic ointment. She never sought medical care, opting to treat her injury at home. Patient presents today with concerns of infection as area is more tender, slightly swollen, and has more redness than it has had in the past. Patient reports serosanguinous drainage. She has not experienced any fevers. Tetanus is not UTD.   Past Medical History:  Diagnosis Date   Allergy    Atrial fibrillation ([place])    Cancer ([place])    esophagus/chemo and rad   Colon cancer ([place])    chemo/rad 15 yrs   Colon cancer ([place])    2 yrs ago   DDD (degenerative disc disease), cervical    Dysrhythmia    Elevated lipids    GERD (gastroesophageal reflux disease)    Hyperlipidemia    Hypertension    Lung cancer ([place])    chemo   Lung cancer ([place])    Major depressive disorder, recurrent episode, moderate (HCC)    Persistent disorder of initiating or maintaining sleep    Personal history of chemotherapy    Personal history of radiation therapy    SVT (supraventricular tachycardia) ([place])     Past Surgical History:  Procedure  Laterality Date   ABDOMINAL HYSTERECTOMY     ATRIAL FIBRILLATION ABLATION     BUNIONECTOMY     CAPSULOTOMY METATARSOPHALANGEAL Left 04/19/2017   Procedure: CAPSULOTOMY METATARSOPHALANGEAL;  Surgeon: Samara Deist, DPM;  Location: [facility];  Service: Podiatry;  Laterality: Left;   COLECTOMY     COLONOSCOPY WITH PROPOFOL N/A 09/26/2017   Procedure: COLONOSCOPY WITH PROPOFOL;  Surgeon: Lollie Sails, MD;  Location: [facility] ENDOSCOPY;  Service: Endoscopy;  Laterality: N/A;   ESOPHAGOGASTRODUODENOSCOPY N/A 04/21/2015   Procedure: ESOPHAGOGASTRODUODENOSCOPY (EGD);  Surgeon: Lollie Sails, MD;  Location: [facility] ENDOSCOPY;  Service: Endoscopy;  Laterality: N/A;   Esophgeal cancer     HAMMER TOE SURGERY Left 04/19/2017   Procedure: HAMMER TOE CORRECTION-3RD & 4TH;  Surgeon: Samara Deist, DPM;  Location: [facility];  Service: Podiatry;  Laterality: Left;   LOBECTOMY Right    METATARSAL OSTEOTOMY Left 04/19/2017   Procedure: PHALANX OSTEOTOMY-AKIN - LEFT;  Surgeon: Samara Deist, DPM;  Location: [facility];  Service: Podiatry;  Laterality: Left;   PARTIAL HYSTERECTOMY     SINUSOTOMY      Family History  Problem Relation Age of Onset   Stroke Mother    Depression Mother    Breast cancer Mother 53   Breast cancer Maternal Aunt 19   Alcohol abuse Father    Stroke Father    Hypertension Father     Social History   Tobacco Use   Smoking status:  Former Smoker   Smokeless tobacco: Never Used  Substance Use Topics   Alcohol use: Yes    Comment: 3 none last 24hrs   Drug use: No    Patient Active Problem List   Diagnosis Date Noted   Cancer of cecum ([place]) 10/04/2017   Normocytic anemia 10/04/2017   A-fib ([place]) 06/16/2015   Cancer of colon ([place]) 06/16/2015   DD (diverticular disease) 06/16/2015   Esophageal ulcer 06/16/2015   Hallux abducto valgus 06/16/2015   Hammer toe 06/16/2015   H/O varicella 06/16/2015   HK  (hyperkeratosis) 06/16/2015   BP (high blood pressure) 06/16/2015   Neuritis or radiculitis due to rupture of lumbar intervertebral disc 06/16/2015   Cancer of lung ([place]) 06/16/2015   Degenerative arthritis of spine 06/16/2015   SCC (squamous cell carcinoma of esophagus) (HCC) 06/16/2015   Supraventricular tachycardia ([place]) 06/16/2015   Temporary cerebral vascular dysfunction 06/16/2015   Familial multiple lipoprotein-type hyperlipidemia 03/27/2015   Clinical depression 03/27/2015   Acid reflux 03/27/2015   CN (constipation) 03/27/2015   Ulcerative cystitis 03/27/2015   Routine general medical examination at a health care facility 03/27/2015   Allergic rhinitis 03/27/2015   H/O malignant neoplasm 03/27/2015   Contusion of knee and lower leg 03/27/2015   Narrowing of intervertebral disc space 03/27/2015   DDD (degenerative disc disease), cervical 03/27/2015   DDD (degenerative disc disease), lumbar 03/27/2015   H/O: depression 03/27/2015   Can't get food down 03/27/2015   Essential (primary) hypertension 03/27/2015   Cephalalgia 03/27/2015   Discitis of lumbar region 03/27/2015   Blood in the urine 03/27/2015   H/O: HTN (hypertension) 03/27/2015   Menopause 03/27/2015   Screening for depression 03/27/2015   Sinus infection 03/27/2015   FOM (frequency of micturition) 03/27/2015   Arrhythmia, ventricular 03/27/2015   Encounter for mammogram to establish baseline mammogram 03/27/2015   Atrial tachycardia ([place]) 03/27/2015   Lumbar and sacral osteoarthritis 11/03/2014   Atrial fibrillation ([place]) 09/18/2014   Cancer of esophagus ([place]) 06/25/2014   Malignant neoplasm of esophagus ([place]) 06/25/2014    Home Medications:    Current Meds  Medication Sig   amLODipine (NORVASC) 10 MG tablet TAKE ONE TABLET BY MOUTH ONCE DAILY-NEED  APPOINTMENT (Patient taking differently: Take 10 mg daily by mouth. )   aspirin EC 81 MG tablet Take 81 mg daily by  mouth.   BIOTIN PO Take 1 tablet by mouth daily.   buPROPion (WELLBUTRIN SR) 150 MG 12 hr tablet Take 1 tablet (150 mg total) by mouth 2 (two) times daily.   cetirizine (ZYRTEC) 10 MG tablet Take 1 tablet (10 mg total) by mouth 2 (two) times daily. otc (Patient taking differently: Take 10 mg daily by mouth. )   Cholecalciferol (VITAMIN D-3) 1000 UNITS CAPS Take 1,000 Units daily by mouth.    diphenhydrAMINE (BENADRYL) 25 mg capsule Take 50-75 mg every 6 (six) hours as needed by mouth for itching.    fluticasone (FLONASE) 50 MCG/ACT nasal spray USE ONE SPRAY(S) IN EACH NOSTRIL ONCE DAILY (Patient taking differently: Place 2 sprays daily into both nostrils. )   Multiple Vitamins-Minerals (MULTIVITAMIN PO) Take 1 tablet daily by mouth.   pantoprazole (PROTONIX) 40 MG tablet Take 1 tablet (40 mg total) by mouth 2 (two) times daily.   ramipril (ALTACE) 10 MG capsule TAKE ONE CAPSULE BY MOUTH ONCE DAILY. MUST SCHEDULE APPOINTMENT FOR NOVEMBER (Patient taking differently: Take 10 mg daily by mouth. )   senna-docusate (SENOKOT-S) 8.6-50 MG tablet Take 2 tablets  daily by mouth.   sertraline (ZOLOFT) 25 MG tablet Take 1 tablet (25 mg total) by mouth daily.   simvastatin (ZOCOR) 40 MG tablet Take 1 tablet (40 mg total) by mouth daily.   sotalol (BETAPACE) 80 MG tablet Take 1 tablet (80 mg total) by mouth daily. (Patient taking differently: Take 80 mg 2 (two) times daily by mouth. )   [DISCONTINUED] Omega-3 Fatty Acids (FISH OIL PO) Take 1 capsule daily by mouth.    Allergies:   Amoxicillin-pot clavulanate, Codeine, Demerol [meperidine], Latex, Morphine and related, Oxycodone-acetaminophen, Tramadol, Buprenorphine hcl, Fentanyl, Strawberry extract, and Tapentadol  Review of Systems (ROS): Review of Systems  Constitutional: Negative for chills and fever.  Respiratory: Negative for cough and shortness of breath.   Cardiovascular: Negative for chest pain and palpitations.  Skin: Positive for  color change and wound.  All other systems reviewed and are negative.    Vital Signs: Today's Vitals   07/09/19 1510 07/09/19 1511  BP: (!) 189/74   Pulse: (!) 51   Resp: 18   Temp: 98.1 F (36.7 C)   TempSrc: Oral   SpO2: 98%   Weight:  160 lb (72.6 kg)  Height:  5\' 7"  (1.702 m)  PainSc:  1     Physical Exam: Physical Exam  Constitutional: She is oriented to person, place, and time and well-developed, well-nourished, and in no distress.  HENT:  Head: Normocephalic and atraumatic.  Mouth/Throat: Mucous membranes are normal.  Cardiovascular: Normal rate, regular rhythm, normal heart sounds and intact distal pulses. Exam reveals no gallop and no friction rub.  No murmur heard. Pulmonary/Chest: Effort normal and breath sounds normal. No respiratory distress. She has no wheezes. She has no rales.  Neurological: She is alert and oriented to person, place, and time. Gait normal. GCS score is 15.  Skin: Skin is warm and dry. Laceration noted. No rash noted.  2.5 cm linear laceration of the RIGHT distal tib/fib area. (+) ecchymosis noted. Area is TTP with approximately 2 cm area of surrounding annular erythema. Some increased warmth noted. See attached medical photograph.   Psychiatric: Mood, memory, affect and judgment normal.  Nursing note and vitals reviewed.    Urgent Care Treatments / Results:   LABS: PLEASE NOTE: all labs that were ordered this encounter are listed, however only abnormal results are displayed. Labs Reviewed - No data to display  EKG: -None  RADIOLOGY: No results found.  PROCEDURES: Procedures  MEDICATIONS RECEIVED THIS VISIT: Medications  Tdap (BOOSTRIX) injection 0.5 mL (0.5 mLs Intramuscular Given 07/09/19 1519)  bacitracin ointment (1 application Topical Given 07/09/19 1537)    PERTINENT CLINICAL COURSE NOTES/UPDATES:   Initial Impression / Assessment and Plan / Urgent Care Course:  Pertinent labs & imaging results that were available during  my care of the patient were personally reviewed by me and considered in my medical decision making (see lab/imaging section of note for values and interpretations).  Gwenyth Dingee is a 78 y.o. female who presents to [facility] today with complaints of Leg Injury   Patient is well appearing overall in clinic today. She does not appear to be in any acute distress. Presenting symptoms (see HPI) and exam as documented above. Tetanus vaccination status reviewed with patient. Based on her reports, it is determined that she is not up to date on her tetanus prophylaxis. Due to the nature of her injury, the patient will need to have a tetanus injection today while in clinic. Area is healing via secondary  intention. Discussed that there is little benefit to suture/adhesive skin closure at this juncture. Attempted steri-strips, however patient reports increased pain with them in place, thus the decision was made to defer any efforts to close/better approximate wound edges. Discussed with patient that area will undoubtedly result in a scar. She states, "at least it is on the side, and at my age, it doesn't really matter".   Wound cleansed and dressing in clinic; Bacitracin applied by nursing. Concern for associated developing cellulitis with increase in tenderness, surrounding erythema, and slight warmth. Discussed with patient that since injury occurred outdoors, there was potential for animal excrement involvement. With this in mind, the decision was made to cover with both topical (bacitracin) and systemic (doxycycline) antibiotics. Recommended for her to leave open to air while at home, and for her to cover when out in public. Patient to monitor for signs and symptoms of infection, which would include increased redness, swelling, streaking, drainage, pain, and the development of a fever.   Discussed follow up with primary care physician in 1 week for re-evaluation. I have reviewed the follow up and strict  return precautions for any new or worsening symptoms. Patient is aware of symptoms that would be deemed urgent/emergent, and would thus require further evaluation either here or in the emergency department. At the time of discharge, she verbalized understanding and consent with the discharge plan as it was reviewed with her. All questions were fielded by provider and/or clinic staff prior to patient discharge.    Final Clinical Impressions / Urgent Care Diagnoses:   Final diagnoses:  Cellulitis of leg, right  Laceration of skin of left lower leg, initial encounter    New Prescriptions:  [place] Controlled Substance Registry consulted? Not Applicable  Meds ordered this encounter  Medications   Tdap (BOOSTRIX) injection 0.5 mL   bacitracin ointment    Sig: Apply 1 application topically 2 (two) times daily. Until healed    Dispense:  28 g    Refill:  0   doxycycline (VIBRAMYCIN) 100 MG capsule    Sig: Take 1 capsule (100 mg total) by mouth 2 (two) times daily.    Dispense:  20 capsule    Refill:  0   bacitracin ointment    Recommended Follow up Care:  Patient encouraged to follow up with the following provider within the specified time frame, or sooner as dictated by the severity of her symptoms. As always, she was instructed that for any urgent/emergent care needs, she should seek care either here or in the emergency department for more immediate evaluation.  Follow-up Information    Gauger, Victoriano Lain, NP In 1 week.   Specialty: Internal Medicine Why: General reassessment of symptoms if not improving Contact information: [address] [place] [address] [phone]         NOTE: This note was prepared using Dragon dictation software along with smaller phrase technology. Despite my best ability to proofread, there is the potential that transcriptional errors may still occur from this process, and are completely unintentional. Karen Kitchens, NP 07/10/19 1109

## 2019-07-31 DIAGNOSIS — R69 Illness, unspecified: Secondary | ICD-10-CM | POA: Diagnosis not present

## 2019-07-31 DIAGNOSIS — Z8501 Personal history of malignant neoplasm of esophagus: Secondary | ICD-10-CM | POA: Diagnosis not present

## 2019-07-31 DIAGNOSIS — M19012 Primary osteoarthritis, left shoulder: Secondary | ICD-10-CM | POA: Diagnosis not present

## 2019-07-31 DIAGNOSIS — M25512 Pain in left shoulder: Secondary | ICD-10-CM | POA: Diagnosis not present

## 2019-08-15 DIAGNOSIS — R69 Illness, unspecified: Secondary | ICD-10-CM | POA: Diagnosis not present

## 2019-09-11 DIAGNOSIS — Z85038 Personal history of other malignant neoplasm of large intestine: Secondary | ICD-10-CM | POA: Diagnosis not present

## 2019-09-11 DIAGNOSIS — Z8501 Personal history of malignant neoplasm of esophagus: Secondary | ICD-10-CM | POA: Diagnosis not present

## 2019-10-07 DIAGNOSIS — Z1159 Encounter for screening for other viral diseases: Secondary | ICD-10-CM | POA: Diagnosis not present

## 2019-10-07 DIAGNOSIS — I10 Essential (primary) hypertension: Secondary | ICD-10-CM | POA: Diagnosis not present

## 2019-10-07 DIAGNOSIS — Z79899 Other long term (current) drug therapy: Secondary | ICD-10-CM | POA: Diagnosis not present

## 2019-10-07 DIAGNOSIS — M7918 Myalgia, other site: Secondary | ICD-10-CM | POA: Diagnosis not present

## 2019-10-07 DIAGNOSIS — E782 Mixed hyperlipidemia: Secondary | ICD-10-CM | POA: Diagnosis not present

## 2019-10-07 DIAGNOSIS — Z7982 Long term (current) use of aspirin: Secondary | ICD-10-CM | POA: Diagnosis not present

## 2019-10-07 DIAGNOSIS — R69 Illness, unspecified: Secondary | ICD-10-CM | POA: Diagnosis not present

## 2019-10-07 DIAGNOSIS — Z8501 Personal history of malignant neoplasm of esophagus: Secondary | ICD-10-CM | POA: Diagnosis not present

## 2019-10-07 DIAGNOSIS — I480 Paroxysmal atrial fibrillation: Secondary | ICD-10-CM | POA: Diagnosis not present

## 2019-10-07 DIAGNOSIS — I471 Supraventricular tachycardia: Secondary | ICD-10-CM | POA: Diagnosis not present

## 2019-10-07 DIAGNOSIS — M7522 Bicipital tendinitis, left shoulder: Secondary | ICD-10-CM | POA: Diagnosis not present

## 2019-10-07 DIAGNOSIS — Z0000 Encounter for general adult medical examination without abnormal findings: Secondary | ICD-10-CM | POA: Diagnosis not present

## 2019-10-07 DIAGNOSIS — K219 Gastro-esophageal reflux disease without esophagitis: Secondary | ICD-10-CM | POA: Diagnosis not present

## 2019-10-26 DIAGNOSIS — Z20828 Contact with and (suspected) exposure to other viral communicable diseases: Secondary | ICD-10-CM | POA: Diagnosis not present

## 2019-10-26 DIAGNOSIS — Z01812 Encounter for preprocedural laboratory examination: Secondary | ICD-10-CM | POA: Diagnosis not present

## 2019-10-29 DIAGNOSIS — K5730 Diverticulosis of large intestine without perforation or abscess without bleeding: Secondary | ICD-10-CM | POA: Diagnosis not present

## 2019-10-29 DIAGNOSIS — K640 First degree hemorrhoids: Secondary | ICD-10-CM | POA: Diagnosis not present

## 2019-10-29 DIAGNOSIS — Z980 Intestinal bypass and anastomosis status: Secondary | ICD-10-CM | POA: Diagnosis not present

## 2019-10-29 DIAGNOSIS — K635 Polyp of colon: Secondary | ICD-10-CM | POA: Diagnosis not present

## 2019-10-29 DIAGNOSIS — D123 Benign neoplasm of transverse colon: Secondary | ICD-10-CM | POA: Diagnosis not present

## 2019-10-29 DIAGNOSIS — I10 Essential (primary) hypertension: Secondary | ICD-10-CM | POA: Diagnosis not present

## 2019-10-29 DIAGNOSIS — Z85038 Personal history of other malignant neoplasm of large intestine: Secondary | ICD-10-CM | POA: Diagnosis not present

## 2019-10-29 DIAGNOSIS — K219 Gastro-esophageal reflux disease without esophagitis: Secondary | ICD-10-CM | POA: Diagnosis not present

## 2019-10-29 DIAGNOSIS — Z1211 Encounter for screening for malignant neoplasm of colon: Secondary | ICD-10-CM | POA: Diagnosis not present

## 2019-11-19 DIAGNOSIS — R69 Illness, unspecified: Secondary | ICD-10-CM | POA: Diagnosis not present

## 2019-11-19 DIAGNOSIS — I10 Essential (primary) hypertension: Secondary | ICD-10-CM | POA: Diagnosis not present

## 2019-11-19 DIAGNOSIS — K5900 Constipation, unspecified: Secondary | ICD-10-CM | POA: Diagnosis not present

## 2019-11-19 DIAGNOSIS — I4891 Unspecified atrial fibrillation: Secondary | ICD-10-CM | POA: Diagnosis not present

## 2019-11-19 DIAGNOSIS — E785 Hyperlipidemia, unspecified: Secondary | ICD-10-CM | POA: Diagnosis not present

## 2019-11-19 DIAGNOSIS — D6869 Other thrombophilia: Secondary | ICD-10-CM | POA: Diagnosis not present

## 2019-11-19 DIAGNOSIS — J309 Allergic rhinitis, unspecified: Secondary | ICD-10-CM | POA: Diagnosis not present

## 2019-11-19 DIAGNOSIS — K219 Gastro-esophageal reflux disease without esophagitis: Secondary | ICD-10-CM | POA: Diagnosis not present

## 2019-11-19 DIAGNOSIS — R32 Unspecified urinary incontinence: Secondary | ICD-10-CM | POA: Diagnosis not present

## 2019-11-25 ENCOUNTER — Other Ambulatory Visit: Payer: Self-pay | Admitting: Nurse Practitioner

## 2019-11-25 DIAGNOSIS — N6001 Solitary cyst of right breast: Secondary | ICD-10-CM

## 2019-12-30 ENCOUNTER — Ambulatory Visit
Admission: RE | Admit: 2019-12-30 | Discharge: 2019-12-30 | Disposition: A | Discharge status: Home / Self Care | Payer: Medicare HMO | Source: Ambulatory Visit | Attending: Nurse Practitioner | Admitting: Nurse Practitioner

## 2019-12-30 DIAGNOSIS — N6001 Solitary cyst of right breast: Secondary | ICD-10-CM

## 2019-12-30 DIAGNOSIS — R222 Localized swelling, mass and lump, trunk: Secondary | ICD-10-CM | POA: Diagnosis not present

## 2020-01-06 DIAGNOSIS — D485 Neoplasm of uncertain behavior of skin: Secondary | ICD-10-CM | POA: Diagnosis not present

## 2020-01-06 DIAGNOSIS — D0472 Carcinoma in situ of skin of left lower limb, including hip: Secondary | ICD-10-CM | POA: Diagnosis not present

## 2020-01-06 DIAGNOSIS — M2042 Other hammer toe(s) (acquired), left foot: Secondary | ICD-10-CM | POA: Diagnosis not present

## 2020-01-20 DIAGNOSIS — M2042 Other hammer toe(s) (acquired), left foot: Secondary | ICD-10-CM | POA: Diagnosis not present

## 2020-01-20 DIAGNOSIS — D485 Neoplasm of uncertain behavior of skin: Secondary | ICD-10-CM | POA: Diagnosis not present

## 2020-02-10 DIAGNOSIS — D0472 Carcinoma in situ of skin of left lower limb, including hip: Secondary | ICD-10-CM | POA: Diagnosis not present

## 2020-02-10 DIAGNOSIS — C44722 Squamous cell carcinoma of skin of right lower limb, including hip: Secondary | ICD-10-CM | POA: Diagnosis not present

## 2020-02-10 DIAGNOSIS — R238 Other skin changes: Secondary | ICD-10-CM | POA: Diagnosis not present

## 2020-03-09 DIAGNOSIS — L578 Other skin changes due to chronic exposure to nonionizing radiation: Secondary | ICD-10-CM | POA: Diagnosis not present

## 2020-03-09 DIAGNOSIS — Z85828 Personal history of other malignant neoplasm of skin: Secondary | ICD-10-CM | POA: Diagnosis not present

## 2020-03-09 DIAGNOSIS — C44619 Basal cell carcinoma of skin of left upper limb, including shoulder: Secondary | ICD-10-CM | POA: Diagnosis not present

## 2020-03-09 DIAGNOSIS — L570 Actinic keratosis: Secondary | ICD-10-CM | POA: Diagnosis not present

## 2020-03-09 DIAGNOSIS — Z872 Personal history of diseases of the skin and subcutaneous tissue: Secondary | ICD-10-CM | POA: Diagnosis not present

## 2020-03-09 DIAGNOSIS — D485 Neoplasm of uncertain behavior of skin: Secondary | ICD-10-CM | POA: Diagnosis not present

## 2020-03-11 DIAGNOSIS — C44722 Squamous cell carcinoma of skin of right lower limb, including hip: Secondary | ICD-10-CM | POA: Diagnosis not present

## 2020-04-01 DIAGNOSIS — H16223 Keratoconjunctivitis sicca, not specified as Sjogren's, bilateral: Secondary | ICD-10-CM | POA: Diagnosis not present

## 2020-04-01 DIAGNOSIS — H524 Presbyopia: Secondary | ICD-10-CM | POA: Diagnosis not present

## 2020-04-02 DIAGNOSIS — H524 Presbyopia: Secondary | ICD-10-CM | POA: Diagnosis not present

## 2020-04-02 DIAGNOSIS — C44619 Basal cell carcinoma of skin of left upper limb, including shoulder: Secondary | ICD-10-CM | POA: Diagnosis not present

## 2020-04-06 DIAGNOSIS — Z79899 Other long term (current) drug therapy: Secondary | ICD-10-CM | POA: Diagnosis not present

## 2020-04-06 DIAGNOSIS — R69 Illness, unspecified: Secondary | ICD-10-CM | POA: Diagnosis not present

## 2020-04-06 DIAGNOSIS — K219 Gastro-esophageal reflux disease without esophagitis: Secondary | ICD-10-CM | POA: Diagnosis not present

## 2020-04-06 DIAGNOSIS — I471 Supraventricular tachycardia: Secondary | ICD-10-CM | POA: Diagnosis not present

## 2020-04-06 DIAGNOSIS — I10 Essential (primary) hypertension: Secondary | ICD-10-CM | POA: Diagnosis not present

## 2020-04-06 DIAGNOSIS — I480 Paroxysmal atrial fibrillation: Secondary | ICD-10-CM | POA: Diagnosis not present

## 2020-04-06 DIAGNOSIS — E782 Mixed hyperlipidemia: Secondary | ICD-10-CM | POA: Diagnosis not present

## 2020-04-09 DIAGNOSIS — C44729 Squamous cell carcinoma of skin of left lower limb, including hip: Secondary | ICD-10-CM | POA: Diagnosis not present

## 2020-04-09 DIAGNOSIS — L929 Granulomatous disorder of the skin and subcutaneous tissue, unspecified: Secondary | ICD-10-CM | POA: Diagnosis not present

## 2020-04-09 DIAGNOSIS — L905 Scar conditions and fibrosis of skin: Secondary | ICD-10-CM | POA: Diagnosis not present

## 2020-04-09 DIAGNOSIS — L570 Actinic keratosis: Secondary | ICD-10-CM | POA: Diagnosis not present

## 2020-04-09 DIAGNOSIS — M795 Residual foreign body in soft tissue: Secondary | ICD-10-CM | POA: Diagnosis not present

## 2020-04-15 DIAGNOSIS — C44722 Squamous cell carcinoma of skin of right lower limb, including hip: Secondary | ICD-10-CM | POA: Diagnosis not present

## 2020-04-27 DIAGNOSIS — C44729 Squamous cell carcinoma of skin of left lower limb, including hip: Secondary | ICD-10-CM | POA: Diagnosis not present

## 2020-05-22 ENCOUNTER — Other Ambulatory Visit: Payer: Self-pay | Admitting: Nurse Practitioner

## 2020-05-22 DIAGNOSIS — N6001 Solitary cyst of right breast: Secondary | ICD-10-CM

## 2020-05-26 DIAGNOSIS — K219 Gastro-esophageal reflux disease without esophagitis: Secondary | ICD-10-CM | POA: Diagnosis not present

## 2020-05-26 DIAGNOSIS — E785 Hyperlipidemia, unspecified: Secondary | ICD-10-CM | POA: Diagnosis not present

## 2020-05-26 DIAGNOSIS — I10 Essential (primary) hypertension: Secondary | ICD-10-CM | POA: Diagnosis not present

## 2020-05-26 DIAGNOSIS — Z8673 Personal history of transient ischemic attack (TIA), and cerebral infarction without residual deficits: Secondary | ICD-10-CM | POA: Diagnosis not present

## 2020-05-26 DIAGNOSIS — Z91040 Latex allergy status: Secondary | ICD-10-CM | POA: Diagnosis not present

## 2020-05-26 DIAGNOSIS — Z800 Family history of malignant neoplasm of digestive organs: Secondary | ICD-10-CM | POA: Diagnosis not present

## 2020-05-26 DIAGNOSIS — Z880 Allergy status to penicillin: Secondary | ICD-10-CM | POA: Diagnosis not present

## 2020-05-26 DIAGNOSIS — Z8501 Personal history of malignant neoplasm of esophagus: Secondary | ICD-10-CM | POA: Diagnosis not present

## 2020-05-26 DIAGNOSIS — Z79899 Other long term (current) drug therapy: Secondary | ICD-10-CM | POA: Diagnosis not present

## 2020-05-26 DIAGNOSIS — Z885 Allergy status to narcotic agent status: Secondary | ICD-10-CM | POA: Diagnosis not present

## 2020-05-26 DIAGNOSIS — C159 Malignant neoplasm of esophagus, unspecified: Secondary | ICD-10-CM | POA: Diagnosis not present

## 2020-05-26 DIAGNOSIS — K222 Esophageal obstruction: Secondary | ICD-10-CM | POA: Diagnosis not present

## 2020-06-09 DIAGNOSIS — E785 Hyperlipidemia, unspecified: Secondary | ICD-10-CM | POA: Diagnosis not present

## 2020-06-09 DIAGNOSIS — I10 Essential (primary) hypertension: Secondary | ICD-10-CM | POA: Diagnosis not present

## 2020-06-09 DIAGNOSIS — J301 Allergic rhinitis due to pollen: Secondary | ICD-10-CM | POA: Diagnosis not present

## 2020-06-09 DIAGNOSIS — R69 Illness, unspecified: Secondary | ICD-10-CM | POA: Diagnosis not present

## 2020-06-09 DIAGNOSIS — I4891 Unspecified atrial fibrillation: Secondary | ICD-10-CM | POA: Diagnosis not present

## 2020-06-09 DIAGNOSIS — K5900 Constipation, unspecified: Secondary | ICD-10-CM | POA: Diagnosis not present

## 2020-06-09 DIAGNOSIS — Z7982 Long term (current) use of aspirin: Secondary | ICD-10-CM | POA: Diagnosis not present

## 2020-06-09 DIAGNOSIS — R32 Unspecified urinary incontinence: Secondary | ICD-10-CM | POA: Diagnosis not present

## 2020-06-09 DIAGNOSIS — D6869 Other thrombophilia: Secondary | ICD-10-CM | POA: Diagnosis not present

## 2020-06-09 DIAGNOSIS — K219 Gastro-esophageal reflux disease without esophagitis: Secondary | ICD-10-CM | POA: Diagnosis not present

## 2020-06-18 ENCOUNTER — Ambulatory Visit
Admission: RE | Admit: 2020-06-18 | Discharge: 2020-06-18 | Disposition: A | Discharge status: Home / Self Care | Payer: Medicare HMO | Source: Ambulatory Visit | Attending: Nurse Practitioner | Admitting: Nurse Practitioner

## 2020-06-18 DIAGNOSIS — R922 Inconclusive mammogram: Secondary | ICD-10-CM | POA: Diagnosis not present

## 2020-06-18 DIAGNOSIS — N6001 Solitary cyst of right breast: Secondary | ICD-10-CM

## 2020-06-18 DIAGNOSIS — N6311 Unspecified lump in the right breast, upper outer quadrant: Secondary | ICD-10-CM | POA: Diagnosis not present

## 2020-08-20 DIAGNOSIS — R69 Illness, unspecified: Secondary | ICD-10-CM | POA: Diagnosis not present

## 2020-10-13 DIAGNOSIS — K219 Gastro-esophageal reflux disease without esophagitis: Secondary | ICD-10-CM | POA: Diagnosis not present

## 2020-10-13 DIAGNOSIS — Z79899 Other long term (current) drug therapy: Secondary | ICD-10-CM | POA: Diagnosis not present

## 2020-10-13 DIAGNOSIS — I471 Supraventricular tachycardia: Secondary | ICD-10-CM | POA: Diagnosis not present

## 2020-10-13 DIAGNOSIS — I10 Essential (primary) hypertension: Secondary | ICD-10-CM | POA: Diagnosis not present

## 2020-10-13 DIAGNOSIS — Z0000 Encounter for general adult medical examination without abnormal findings: Secondary | ICD-10-CM | POA: Diagnosis not present

## 2020-10-13 DIAGNOSIS — I480 Paroxysmal atrial fibrillation: Secondary | ICD-10-CM | POA: Diagnosis not present

## 2020-10-13 DIAGNOSIS — R69 Illness, unspecified: Secondary | ICD-10-CM | POA: Diagnosis not present

## 2020-10-13 DIAGNOSIS — E782 Mixed hyperlipidemia: Secondary | ICD-10-CM | POA: Diagnosis not present

## 2020-12-17 DIAGNOSIS — Z85828 Personal history of other malignant neoplasm of skin: Secondary | ICD-10-CM | POA: Diagnosis not present

## 2020-12-17 DIAGNOSIS — Z872 Personal history of diseases of the skin and subcutaneous tissue: Secondary | ICD-10-CM | POA: Diagnosis not present

## 2020-12-17 DIAGNOSIS — L578 Other skin changes due to chronic exposure to nonionizing radiation: Secondary | ICD-10-CM | POA: Diagnosis not present

## 2020-12-17 DIAGNOSIS — D485 Neoplasm of uncertain behavior of skin: Secondary | ICD-10-CM | POA: Diagnosis not present

## 2020-12-17 DIAGNOSIS — D0439 Carcinoma in situ of skin of other parts of face: Secondary | ICD-10-CM | POA: Diagnosis not present

## 2020-12-17 DIAGNOSIS — L820 Inflamed seborrheic keratosis: Secondary | ICD-10-CM | POA: Diagnosis not present

## 2020-12-17 DIAGNOSIS — C44529 Squamous cell carcinoma of skin of other part of trunk: Secondary | ICD-10-CM | POA: Diagnosis not present

## 2021-01-06 DIAGNOSIS — D0439 Carcinoma in situ of skin of other parts of face: Secondary | ICD-10-CM | POA: Diagnosis not present

## 2021-01-07 DIAGNOSIS — C4442 Squamous cell carcinoma of skin of scalp and neck: Secondary | ICD-10-CM | POA: Diagnosis not present

## 2021-01-13 DIAGNOSIS — Z48817 Encounter for surgical aftercare following surgery on the skin and subcutaneous tissue: Secondary | ICD-10-CM | POA: Diagnosis not present

## 2021-02-08 DIAGNOSIS — L905 Scar conditions and fibrosis of skin: Secondary | ICD-10-CM | POA: Diagnosis not present

## 2021-03-31 DIAGNOSIS — L139 Bullous disorder, unspecified: Secondary | ICD-10-CM | POA: Diagnosis not present

## 2021-04-13 DIAGNOSIS — I10 Essential (primary) hypertension: Secondary | ICD-10-CM | POA: Diagnosis not present

## 2021-04-13 DIAGNOSIS — B029 Zoster without complications: Secondary | ICD-10-CM | POA: Diagnosis not present

## 2021-04-13 DIAGNOSIS — Z636 Dependent relative needing care at home: Secondary | ICD-10-CM | POA: Diagnosis not present

## 2021-04-13 DIAGNOSIS — I471 Supraventricular tachycardia: Secondary | ICD-10-CM | POA: Diagnosis not present

## 2021-04-13 DIAGNOSIS — R69 Illness, unspecified: Secondary | ICD-10-CM | POA: Diagnosis not present

## 2021-04-13 DIAGNOSIS — I480 Paroxysmal atrial fibrillation: Secondary | ICD-10-CM | POA: Diagnosis not present

## 2021-04-13 DIAGNOSIS — E782 Mixed hyperlipidemia: Secondary | ICD-10-CM | POA: Diagnosis not present

## 2021-04-13 DIAGNOSIS — Z79899 Other long term (current) drug therapy: Secondary | ICD-10-CM | POA: Diagnosis not present

## 2021-04-13 DIAGNOSIS — K219 Gastro-esophageal reflux disease without esophagitis: Secondary | ICD-10-CM | POA: Diagnosis not present

## 2021-04-13 DIAGNOSIS — Z85038 Personal history of other malignant neoplasm of large intestine: Secondary | ICD-10-CM | POA: Diagnosis not present

## 2021-06-03 ENCOUNTER — Other Ambulatory Visit: Payer: Self-pay

## 2021-06-03 ENCOUNTER — Observation Stay: Payer: Medicare HMO

## 2021-06-03 ENCOUNTER — Emergency Department: Payer: Medicare HMO

## 2021-06-03 ENCOUNTER — Inpatient Hospital Stay
Admission: EM | Admit: 2021-06-03 | Discharge: 2021-06-06 | DRG: 064 | Disposition: A | Discharge status: Home Health | Payer: Medicare HMO | Attending: Internal Medicine | Admitting: Internal Medicine

## 2021-06-03 DIAGNOSIS — R001 Bradycardia, unspecified: Secondary | ICD-10-CM | POA: Diagnosis present

## 2021-06-03 DIAGNOSIS — I499 Cardiac arrhythmia, unspecified: Secondary | ICD-10-CM

## 2021-06-03 DIAGNOSIS — R29810 Facial weakness: Secondary | ICD-10-CM | POA: Diagnosis not present

## 2021-06-03 DIAGNOSIS — C180 Malignant neoplasm of cecum: Secondary | ICD-10-CM | POA: Diagnosis present

## 2021-06-03 DIAGNOSIS — I63411 Cerebral infarction due to embolism of right middle cerebral artery: Secondary | ICD-10-CM | POA: Diagnosis not present

## 2021-06-03 DIAGNOSIS — G8194 Hemiplegia, unspecified affecting left nondominant side: Secondary | ICD-10-CM | POA: Diagnosis present

## 2021-06-03 DIAGNOSIS — R29700 NIHSS score 0: Secondary | ICD-10-CM | POA: Diagnosis present

## 2021-06-03 DIAGNOSIS — S0003XA Contusion of scalp, initial encounter: Secondary | ICD-10-CM | POA: Diagnosis present

## 2021-06-03 DIAGNOSIS — Z8249 Family history of ischemic heart disease and other diseases of the circulatory system: Secondary | ICD-10-CM

## 2021-06-03 DIAGNOSIS — I63532 Cerebral infarction due to unspecified occlusion or stenosis of left posterior cerebral artery: Secondary | ICD-10-CM | POA: Diagnosis not present

## 2021-06-03 DIAGNOSIS — Z818 Family history of other mental and behavioral disorders: Secondary | ICD-10-CM

## 2021-06-03 DIAGNOSIS — R42 Dizziness and giddiness: Secondary | ICD-10-CM | POA: Diagnosis not present

## 2021-06-03 DIAGNOSIS — Z811 Family history of alcohol abuse and dependence: Secondary | ICD-10-CM

## 2021-06-03 DIAGNOSIS — Z888 Allergy status to other drugs, medicaments and biological substances status: Secondary | ICD-10-CM

## 2021-06-03 DIAGNOSIS — F329 Major depressive disorder, single episode, unspecified: Secondary | ICD-10-CM | POA: Diagnosis not present

## 2021-06-03 DIAGNOSIS — I480 Paroxysmal atrial fibrillation: Secondary | ICD-10-CM

## 2021-06-03 DIAGNOSIS — Z803 Family history of malignant neoplasm of breast: Secondary | ICD-10-CM

## 2021-06-03 DIAGNOSIS — I10 Essential (primary) hypertension: Secondary | ICD-10-CM | POA: Diagnosis present

## 2021-06-03 DIAGNOSIS — Z79899 Other long term (current) drug therapy: Secondary | ICD-10-CM

## 2021-06-03 DIAGNOSIS — I63233 Cerebral infarction due to unspecified occlusion or stenosis of bilateral carotid arteries: Secondary | ICD-10-CM | POA: Diagnosis not present

## 2021-06-03 DIAGNOSIS — I4891 Unspecified atrial fibrillation: Secondary | ICD-10-CM | POA: Diagnosis present

## 2021-06-03 DIAGNOSIS — Z881 Allergy status to other antibiotic agents status: Secondary | ICD-10-CM

## 2021-06-03 DIAGNOSIS — I63213 Cerebral infarction due to unspecified occlusion or stenosis of bilateral vertebral arteries: Secondary | ICD-10-CM | POA: Diagnosis not present

## 2021-06-03 DIAGNOSIS — I160 Hypertensive urgency: Secondary | ICD-10-CM | POA: Diagnosis present

## 2021-06-03 DIAGNOSIS — Z8501 Personal history of malignant neoplasm of esophagus: Secondary | ICD-10-CM

## 2021-06-03 DIAGNOSIS — C7801 Secondary malignant neoplasm of right lung: Secondary | ICD-10-CM | POA: Diagnosis present

## 2021-06-03 DIAGNOSIS — R471 Dysarthria and anarthria: Secondary | ICD-10-CM | POA: Diagnosis present

## 2021-06-03 DIAGNOSIS — I639 Cerebral infarction, unspecified: Secondary | ICD-10-CM | POA: Diagnosis present

## 2021-06-03 DIAGNOSIS — F32A Depression, unspecified: Secondary | ICD-10-CM | POA: Diagnosis present

## 2021-06-03 DIAGNOSIS — F419 Anxiety disorder, unspecified: Secondary | ICD-10-CM | POA: Diagnosis present

## 2021-06-03 DIAGNOSIS — R69 Illness, unspecified: Secondary | ICD-10-CM | POA: Diagnosis not present

## 2021-06-03 DIAGNOSIS — W19XXXA Unspecified fall, initial encounter: Secondary | ICD-10-CM | POA: Diagnosis present

## 2021-06-03 DIAGNOSIS — Z885 Allergy status to narcotic agent status: Secondary | ICD-10-CM

## 2021-06-03 DIAGNOSIS — K219 Gastro-esophageal reflux disease without esophagitis: Secondary | ICD-10-CM | POA: Diagnosis present

## 2021-06-03 DIAGNOSIS — Z7982 Long term (current) use of aspirin: Secondary | ICD-10-CM

## 2021-06-03 DIAGNOSIS — I63521 Cerebral infarction due to unspecified occlusion or stenosis of right anterior cerebral artery: Secondary | ICD-10-CM | POA: Diagnosis not present

## 2021-06-03 DIAGNOSIS — C159 Malignant neoplasm of esophagus, unspecified: Secondary | ICD-10-CM | POA: Diagnosis present

## 2021-06-03 DIAGNOSIS — Z85038 Personal history of other malignant neoplasm of large intestine: Secondary | ICD-10-CM

## 2021-06-03 DIAGNOSIS — H53462 Homonymous bilateral field defects, left side: Secondary | ICD-10-CM | POA: Diagnosis present

## 2021-06-03 DIAGNOSIS — E785 Hyperlipidemia, unspecified: Secondary | ICD-10-CM | POA: Diagnosis present

## 2021-06-03 DIAGNOSIS — R55 Syncope and collapse: Secondary | ICD-10-CM | POA: Diagnosis not present

## 2021-06-03 DIAGNOSIS — J019 Acute sinusitis, unspecified: Secondary | ICD-10-CM | POA: Diagnosis not present

## 2021-06-03 DIAGNOSIS — Z91040 Latex allergy status: Secondary | ICD-10-CM

## 2021-06-03 DIAGNOSIS — R0902 Hypoxemia: Secondary | ICD-10-CM | POA: Diagnosis not present

## 2021-06-03 DIAGNOSIS — G319 Degenerative disease of nervous system, unspecified: Secondary | ICD-10-CM | POA: Diagnosis not present

## 2021-06-03 DIAGNOSIS — I672 Cerebral atherosclerosis: Secondary | ICD-10-CM | POA: Diagnosis present

## 2021-06-03 DIAGNOSIS — M542 Cervicalgia: Secondary | ICD-10-CM | POA: Diagnosis not present

## 2021-06-03 DIAGNOSIS — S065X9A Traumatic subdural hemorrhage with loss of consciousness of unspecified duration, initial encounter: Secondary | ICD-10-CM | POA: Diagnosis present

## 2021-06-03 DIAGNOSIS — C189 Malignant neoplasm of colon, unspecified: Secondary | ICD-10-CM | POA: Diagnosis present

## 2021-06-03 DIAGNOSIS — Z20822 Contact with and (suspected) exposure to covid-19: Secondary | ICD-10-CM | POA: Diagnosis present

## 2021-06-03 DIAGNOSIS — Z923 Personal history of irradiation: Secondary | ICD-10-CM

## 2021-06-03 DIAGNOSIS — Z823 Family history of stroke: Secondary | ICD-10-CM

## 2021-06-03 DIAGNOSIS — R0602 Shortness of breath: Secondary | ICD-10-CM

## 2021-06-03 DIAGNOSIS — R4781 Slurred speech: Secondary | ICD-10-CM

## 2021-06-03 DIAGNOSIS — Z91018 Allergy to other foods: Secondary | ICD-10-CM

## 2021-06-03 DIAGNOSIS — M47812 Spondylosis without myelopathy or radiculopathy, cervical region: Secondary | ICD-10-CM | POA: Diagnosis present

## 2021-06-03 HISTORY — DX: Cerebral infarction, unspecified: I63.9

## 2021-06-03 LAB — COMPREHENSIVE METABOLIC PANEL
ALT: 24 U/L (ref 0–44)
AST: 27 U/L (ref 15–41)
Albumin: 3.7 g/dL (ref 3.5–5.0)
Alkaline Phosphatase: 69 U/L (ref 38–126)
Anion gap: 8 (ref 5–15)
BUN: 17 mg/dL (ref 8–23)
CO2: 31 mmol/L (ref 22–32)
Calcium: 9.1 mg/dL (ref 8.9–10.3)
Chloride: 99 mmol/L (ref 98–111)
Creatinine, Ser: 0.83 mg/dL (ref 0.44–1.00)
GFR, Estimated: >60 mL/min (ref >60)
Glucose, Bld: 107 mg/dL — ABNORMAL HIGH (ref 70–99)
Potassium: 3.8 mmol/L (ref 3.5–5.1)
Sodium: 138 mmol/L (ref 135–145)
Total Bilirubin: 0.8 mg/dL (ref 0.3–1.2)
Total Protein: 7.6 g/dL (ref 6.5–8.1)

## 2021-06-03 LAB — RESP PANEL BY RT-PCR (FLU A&B, COVID) ARPGX2
Influenza A by PCR: NEGATIVE
Influenza B by PCR: NEGATIVE
SARS Coronavirus 2 by RT PCR: NEGATIVE

## 2021-06-03 LAB — CBC
HCT: 38.2 % (ref 36.0–46.0)
Hemoglobin: 12.6 g/dL (ref 12.0–15.0)
MCH: 32.3 pg (ref 26.0–34.0)
MCHC: 33 g/dL (ref 30.0–36.0)
MCV: 97.9 fL (ref 80.0–100.0)
Platelets: 226 10*3/uL (ref 150–400)
RBC: 3.9 MIL/uL (ref 3.87–5.11)
RDW: 13.4 % (ref 11.5–15.5)
WBC: 7.7 10*3/uL (ref 4.0–10.5)
nRBC: 0 % (ref 0.0–0.2)

## 2021-06-03 LAB — PROTIME-INR
INR: 1.1 (ref 0.8–1.2)
Prothrombin Time: 14 seconds (ref 11.4–15.2)

## 2021-06-03 LAB — DIFFERENTIAL
Abs Immature Granulocytes: 0.02 10*3/uL (ref 0.00–0.07)
Basophils Absolute: 0.1 10*3/uL (ref 0.0–0.1)
Basophils Relative: 1 %
Eosinophils Absolute: 0.2 10*3/uL (ref 0.0–0.5)
Eosinophils Relative: 2 %
Immature Granulocytes: 0 %
Lymphocytes Relative: 24 %
Lymphs Abs: 1.9 10*3/uL (ref 0.7–4.0)
Monocytes Absolute: 0.8 10*3/uL (ref 0.1–1.0)
Monocytes Relative: 10 %
Neutro Abs: 4.8 10*3/uL (ref 1.7–7.7)
Neutrophils Relative %: 63 %

## 2021-06-03 LAB — APTT: aPTT: 30 seconds (ref 24–36)

## 2021-06-03 LAB — TROPONIN I (HIGH SENSITIVITY): Troponin I (High Sensitivity): 7 ng/L (ref <18)

## 2021-06-03 MED ORDER — SODIUM CHLORIDE 0.9% FLUSH
3.0000 mL | Freq: Once | INTRAVENOUS | Status: AC
Start: 2021-06-03 — End: 2021-06-03
  Administered 2021-06-03: 3 mL via INTRAVENOUS

## 2021-06-03 MED ORDER — HYDRALAZINE HCL 20 MG/ML IJ SOLN: 10.0000 mg | Freq: Four times a day (QID) | INTRAMUSCULAR | Status: DC | PRN

## 2021-06-03 MED ORDER — SOTALOL HCL 80 MG PO TABS
80.0000 mg | ORAL_TABLET | Freq: Two times a day (BID) | ORAL | Status: DC
  Filled 2021-06-03 (×2): qty 1

## 2021-06-03 MED ORDER — BUPROPION HCL ER (SR) 150 MG PO TB12
150.0000 mg | ORAL_TABLET | Freq: Two times a day (BID) | ORAL | Status: DC
  Administered 2021-06-03 – 2021-06-06 (×6): 150 mg via ORAL
  Filled 2021-06-03 (×7): qty 1

## 2021-06-03 MED ORDER — SERTRALINE HCL 50 MG PO TABS
100.0000 mg | ORAL_TABLET | Freq: Every day | ORAL | Status: DC
  Administered 2021-06-04 – 2021-06-06 (×3): 100 mg via ORAL
  Filled 2021-06-03 (×3): qty 2

## 2021-06-03 MED ORDER — PANTOPRAZOLE SODIUM 40 MG PO TBEC
40.0000 mg | DELAYED_RELEASE_TABLET | Freq: Two times a day (BID) | ORAL | Status: DC
  Administered 2021-06-03 – 2021-06-06 (×6): 40 mg via ORAL
  Filled 2021-06-03 (×6): qty 1

## 2021-06-03 MED ORDER — ASPIRIN 81 MG PO CHEW
324.0000 mg | CHEWABLE_TABLET | Freq: Once | ORAL | Status: AC
  Administered 2021-06-03: 324 mg via ORAL
  Filled 2021-06-03: qty 4

## 2021-06-03 MED ORDER — HYDRALAZINE HCL 20 MG/ML IJ SOLN: 5.0000 mg | INTRAMUSCULAR | Status: DC | PRN

## 2021-06-03 MED ORDER — ASPIRIN EC 81 MG PO TBEC
81.0000 mg | DELAYED_RELEASE_TABLET | Freq: Every day | ORAL | Status: DC
  Administered 2021-06-04: 81 mg via ORAL
  Filled 2021-06-03: qty 1

## 2021-06-03 MED ORDER — ASPIRIN EC 325 MG PO TBEC: 325.0000 mg | DELAYED_RELEASE_TABLET | Freq: Every day | ORAL | Status: DC

## 2021-06-03 MED ORDER — IOHEXOL 350 MG/ML SOLN
75.0000 mL | Freq: Once | INTRAVENOUS | Status: AC | PRN
  Administered 2021-06-03: 75 mL via INTRAVENOUS

## 2021-06-03 MED ORDER — ACETAMINOPHEN 325 MG PO TABS
650.0000 mg | ORAL_TABLET | ORAL | Status: DC | PRN
  Administered 2021-06-03 – 2021-06-05 (×6): 650 mg via ORAL
  Filled 2021-06-03 (×6): qty 2

## 2021-06-03 MED ORDER — ACETAMINOPHEN 650 MG RE SUPP: 650.0000 mg | RECTAL | Status: DC | PRN

## 2021-06-03 MED ORDER — ENOXAPARIN SODIUM 40 MG/0.4ML IJ SOSY: 40.0000 mg | PREFILLED_SYRINGE | INTRAMUSCULAR | Status: DC

## 2021-06-03 MED ORDER — ACETAMINOPHEN 160 MG/5ML PO SOLN
650.0000 mg | ORAL | Status: DC | PRN
  Filled 2021-06-03: qty 20.3

## 2021-06-03 MED ORDER — ASPIRIN 325 MG PO TABS: 325.0000 mg | ORAL_TABLET | Freq: Every day | ORAL | Status: DC

## 2021-06-03 MED ORDER — CLOPIDOGREL BISULFATE 75 MG PO TABS
300.0000 mg | ORAL_TABLET | Freq: Once | ORAL | Status: AC
  Administered 2021-06-04: 300 mg via ORAL
  Filled 2021-06-03: qty 4

## 2021-06-03 MED ORDER — RAMIPRIL 10 MG PO CAPS
10.0000 mg | ORAL_CAPSULE | Freq: Every day | ORAL | Status: DC
  Filled 2021-06-03: qty 1

## 2021-06-03 MED ORDER — LABETALOL HCL 5 MG/ML IV SOLN
5.0000 mg | INTRAVENOUS | Status: DC | PRN
  Administered 2021-06-03: 5 mg via INTRAVENOUS
  Filled 2021-06-03: qty 4

## 2021-06-03 MED ORDER — LABETALOL HCL 5 MG/ML IV SOLN: 5.0000 mg | INTRAVENOUS | Status: DC | PRN

## 2021-06-03 MED ORDER — STROKE: EARLY STAGES OF RECOVERY BOOK: Freq: Once | Status: DC

## 2021-06-03 MED ORDER — ATORVASTATIN CALCIUM 80 MG PO TABS
80.0000 mg | ORAL_TABLET | Freq: Once | ORAL | Status: AC
  Administered 2021-06-03: 80 mg via ORAL
  Filled 2021-06-03: qty 1

## 2021-06-03 MED ORDER — AMLODIPINE BESYLATE 10 MG PO TABS: 10.0000 mg | ORAL_TABLET | Freq: Every day | ORAL | Status: DC

## 2021-06-03 NOTE — ED Notes (Signed)
Patient at MRI 

## 2021-06-03 NOTE — Consult Note (Signed)
[facility] Tele[facility]urology Consult Services  Stat Consult  Date of Service:   06/03/2021 22:04:39  Diagnosis:       I63.9 - Cerebrovascular accident (CVA), unspecified mechanism ([place])  Impression: 80 yo woman presenting for evaluation of difficulty speaking. She has right MCA infarcts; typically I'd expect infarcts to be in the L MCA territory when symptoms are aphasia. Regardless, she will need admission for further workup. Place on plavix in addition to her aspirin with a 300mg  load. Consider asking cardiology if she should be on anticoagulation, given her history of afib.  Our recommendations are outlined below.  Laboratory Studies: Recommend Lipid panel Hemoglobin A1c  Medication: Bolus with Clopidogrel 300 mg bolus x1 and initiate dual antiplatelet therapy with Aspirin 81 mg daily and Clopidogrel 75 mg daily  Nursing Recommendations: Telemetry, IV Fluids, avoid dextrose containing fluids, Maintain euglycemia Neuro checks q4 hrs x 24 hrs and then per shift Head of bed 30 degrees  Consultations: Recommend Speech therapy if failed dysphagia screen Physical therapy/Occupational therapy  Disposition: Neurology will follow   Metrics: [facility] Notification Time: 06/03/2021 22:02:45 Stamp Time: 06/03/2021 22:04:39 Callback Response Time: 06/03/2021 22:05:56   ----------------------------------------------------------------------------------------------------  Chief Complaint: difficulty speaking  History of Present Illness: Patient is a 80 year old Female.  This is an 80 yo woman who presents for difficulty speaking. She woke up this morning, fell and hit her head. Later, she could not get any words out. She has since resolved and feels back to baseline. Since presentation, she's had a CTA H/N, which did not show any LVO and an MRI brain which showed some infarcts in the right MCA territory.    Past Medical History:      Atrial  Fibrillation  Anticoagulant use:  No  Antiplatelet use: Yes asa    Examination: BP(200/74), Pulse(53), Blood Glucose(96) 1A: Level of Consciousness - Alert; keenly responsive + 0 1B: Ask Month and Age - Both Questions Right + 0 1C: Blink Eyes & Squeeze Hands - Performs Both Tasks + 0 2: Test Horizontal Extraocular Movements - Normal + 0 3: Test Visual Fields - No Visual Loss + 0 4: Test Facial Palsy (Use Grimace if Obtunded) - Normal symmetry + 0 5A: Test Left Arm Motor Drift - No Drift for 10 Seconds + 0 5B: Test Right Arm Motor Drift - No Drift for 10 Seconds + 0 6A: Test Left Leg Motor Drift - No Drift for 5 Seconds + 0 6B: Test Right Leg Motor Drift - No Drift for 5 Seconds + 0 7: Test Limb Ataxia (FNF/Heel-Shin) - No Ataxia + 0 8: Test Sensation - Normal; No sensory loss + 0 9: Test Language/Aphasia - Normal; No aphasia + 0 10: Test Dysarthria - Normal + 0 11: Test Extinction/Inattention - No abnormality + 0  NIHSS Score: 0     Patient / Family was informed the Neurology Consult would occur via TeleHealth consult by way of interactive audio and video telecommunications and consented to receiving care in this manner.  Patient is being evaluated for possible acute neurologic impairment and high probability of imminent or life - threatening deterioration.I spent total of 20 minutes providing care to this patient, including time for face to face visit via telemedicine, review of medical records, imaging studies and discussion of findings with providers, the patient and / or family.   Dr Precious Gilding   [facility] [phone]  Case [id number]

## 2021-06-03 NOTE — ED Notes (Signed)
EDP at bedside for results review of CT.

## 2021-06-03 NOTE — ED Provider Notes (Signed)
[facility] Emergency Department Provider Note  ____________________________________________   Event Date/Time   First MD Initiated Contact with Patient 06/03/21 1628     (approximate)  I have reviewed the triage vital signs and the nursing notes.   HISTORY  Chief Complaint Loss of Consciousness and Stroke Symptoms    HPI Kelly Krause is a 80 y.o. female presents emergency department after a fall.  Patient states this morning around 8 or 830 that she woke up became dizzy and was in the hallway going to the bathroom when she fell.  Patient did not trip.  She does not know why she fell.  She hit her head and when she awoke she had slurred speech, drooling, and facial droop.  States the facial droop has improved somewhat.  The slurred speech has dissipated.  But she is also still drooling.  Past Medical History:  Diagnosis Date   Allergy    Atrial fibrillation ([place])    Cancer ([place])    esophagus/chemo and rad   Colon cancer ([place])    chemo/rad 15 yrs   Colon cancer ([place])    2 yrs ago   DDD (degenerative disc disease), cervical    Dysrhythmia    Elevated lipids    GERD (gastroesophageal reflux disease)    Hyperlipidemia    Hypertension    Lung cancer ([place])    chemo   Lung cancer ([place])    Major depressive disorder, recurrent episode, moderate (HCC)    Persistent disorder of initiating or maintaining sleep    Personal history of chemotherapy    Personal history of radiation therapy    colon   SVT (supraventricular tachycardia) ([place])     Patient Active Problem List   Diagnosis Date Noted   Stroke ([place]) 06/03/2021   Hypertensive urgency 06/03/2021   Cancer of cecum ([place]) 10/04/2017   Normocytic anemia 10/04/2017   A-fib ([place]) 06/16/2015   Cancer of colon ([place]) 06/16/2015   DD (diverticular disease) 06/16/2015   Esophageal ulcer 06/16/2015   Hallux abducto valgus 06/16/2015   Hammer toe 06/16/2015   H/O varicella 06/16/2015   HK  (hyperkeratosis) 06/16/2015   BP (high blood pressure) 06/16/2015   Neuritis or radiculitis due to rupture of lumbar intervertebral disc 06/16/2015   Cancer of lung ([place]) 06/16/2015   Degenerative arthritis of spine 06/16/2015   SCC (squamous cell carcinoma of esophagus) ([place]) 06/16/2015   Supraventricular tachycardia ([place]) 06/16/2015   Temporary cerebral vascular dysfunction 06/16/2015   Familial multiple lipoprotein-type hyperlipidemia 03/27/2015   Clinical depression 03/27/2015   Acid reflux 03/27/2015   CN (constipation) 03/27/2015   Ulcerative cystitis 03/27/2015   Routine general medical examination at a health care facility 03/27/2015   Allergic rhinitis 03/27/2015   H/O malignant neoplasm 03/27/2015   Contusion of knee and lower leg 03/27/2015   Narrowing of intervertebral disc space 03/27/2015   DDD (degenerative disc disease), cervical 03/27/2015   DDD (degenerative disc disease), lumbar 03/27/2015   H/O: depression 03/27/2015   Can't get food down 03/27/2015   Essential (primary) hypertension 03/27/2015   Cephalalgia 03/27/2015   Discitis of lumbar region 03/27/2015   Blood in the urine 03/27/2015   H/O: HTN (hypertension) 03/27/2015   Menopause 03/27/2015   Screening for depression 03/27/2015   Sinus infection 03/27/2015   FOM (frequency of micturition) 03/27/2015   Arrhythmia, ventricular 03/27/2015   Encounter for mammogram to establish baseline mammogram 03/27/2015   Atrial tachycardia ([place]) 03/27/2015   Lumbar and sacral osteoarthritis  11/03/2014   Atrial fibrillation ([place]) 09/18/2014   Cancer of esophagus ([place]) 06/25/2014   Malignant neoplasm of esophagus ([place]) 06/25/2014    Past Surgical History:  Procedure Laterality Date   ABDOMINAL HYSTERECTOMY     ATRIAL FIBRILLATION ABLATION     BUNIONECTOMY     CAPSULOTOMY METATARSOPHALANGEAL Left 04/19/2017   Procedure: CAPSULOTOMY METATARSOPHALANGEAL;  Surgeon: Samara Deist, DPM;  Location: [facility];   Service: Podiatry;  Laterality: Left;   COLECTOMY     COLONOSCOPY WITH PROPOFOL N/A 09/26/2017   Procedure: COLONOSCOPY WITH PROPOFOL;  Surgeon: Lollie Sails, MD;  Location: [facility] ENDOSCOPY;  Service: Endoscopy;  Laterality: N/A;   ESOPHAGOGASTRODUODENOSCOPY N/A 04/21/2015   Procedure: ESOPHAGOGASTRODUODENOSCOPY (EGD);  Surgeon: Lollie Sails, MD;  Location[facility];  Service: Endoscopy;  Laterality: N/A;   Esophgeal cancer     HAMMER TOE SURGERY Left 04/19/2017   Procedure: HAMMER TOE CORRECTION-3RD & 4TH;  Surgeon: Samara Deist, DPM;  Location: [facility];  Service: Podiatry;  Laterality: Left;   LOBECTOMY Right    METATARSAL OSTEOTOMY Left 04/19/2017   Procedure: PHALANX OSTEOTOMY-AKIN - LEFT;  Surgeon: Samara Deist, DPM;  Location: [facility];  Service: Podiatry;  Laterality: Left;   PARTIAL HYSTERECTOMY     SINUSOTOMY      Prior to Admission medications   Medication Sig Start Date End Date Taking? Authorizing Provider  amLODipine (NORVASC) 10 MG tablet TAKE ONE TABLET BY MOUTH ONCE DAILY-NEED  APPOINTMENT Patient taking differently: Take 10 mg by mouth daily. 11/01/16  Yes Juline Patch, MD  aspirin EC 81 MG tablet Take 81 mg daily by mouth.   Yes [provider]  BIOTIN PO Take 1 tablet by mouth daily.   Yes [provider]  buPROPion (WELLBUTRIN SR) 150 MG 12 hr tablet Take 1 tablet (150 mg total) by mouth 2 (two) times daily. 11/01/16  Yes Juline Patch, MD  Cholecalciferol (VITAMIN D-3) 1000 UNITS CAPS Take 1,000 Units daily by mouth.    Yes [provider]  diphenhydrAMINE (BENADRYL) 25 mg capsule Take 50-75 mg every 6 (six) hours as needed by mouth for itching.    Yes [provider]  fexofenadine (ALLEGRA) 180 MG tablet Take 180 mg by mouth at bedtime.   Yes [provider]  fluticasone (FLONASE) 50 MCG/ACT nasal spray USE ONE SPRAY(S) IN EACH NOSTRIL ONCE DAILY Patient taking differently: Place  2 sprays into both nostrils daily. 11/01/16  Yes Juline Patch, MD  Multiple Vitamins-Minerals (MULTIVITAMIN PO) Take 1 tablet daily by mouth.   Yes [provider]  Multiple Vitamins-Minerals (OCUVITE ADULT 50+ PO) Take 1 tablet by mouth daily.   Yes [provider]  pantoprazole (PROTONIX) 40 MG tablet Take 1 tablet (40 mg total) by mouth 2 (two) times daily. 11/01/16  Yes Juline Patch, MD  ramipril (ALTACE) 10 MG capsule TAKE ONE CAPSULE BY MOUTH ONCE DAILY. MUST SCHEDULE APPOINTMENT FOR NOVEMBER Patient taking differently: Take 10 mg by mouth daily. 11/01/16  Yes Juline Patch, MD  senna-docusate (SENOKOT-S) 8.6-50 MG tablet Take 2 tablets by mouth at bedtime.   Yes [provider]  sertraline (ZOLOFT) 25 MG tablet Take 1 tablet (25 mg total) by mouth daily. Patient taking differently: Take 100 mg by mouth daily. 11/01/16  Yes Juline Patch, MD  simvastatin (ZOCOR) 40 MG tablet Take 1 tablet (40 mg total) by mouth daily. 11/01/16  Yes Juline Patch, MD  sotalol (BETAPACE) 80 MG  tablet Take 1 tablet (80 mg total) by mouth daily. Patient taking differently: Take 80 mg by mouth 2 (two) times daily. 11/22/16  Yes Juline Patch, MD  bacitracin ointment Apply 1 application topically 2 (two) times daily. Until healed Patient not taking: Reported on 06/03/2021 07/09/19   Karen Kitchens, NP  cetirizine (ZYRTEC) 10 MG tablet Take 1 tablet (10 mg total) by mouth 2 (two) times daily. otc Patient not taking: Reported on 06/03/2021 11/01/16   Juline Patch, MD  doxycycline (VIBRAMYCIN) 100 MG capsule Take 1 capsule (100 mg total) by mouth 2 (two) times daily. Patient not taking: Reported on 06/03/2021 07/09/19   Karen Kitchens, NP  hydrochlorothiazide (HYDRODIURIL) 25 MG tablet TAKE 1 TABLET BY MOUTH ONCE DAILY 10/16/17 07/09/19  Juline Patch, MD  sucralfate (CARAFATE) 1 GM/10ML suspension Take by mouth. Cancer Doc 10/20/15 07/09/19  [provider]     Allergies Amoxicillin-pot clavulanate, Codeine, Demerol [meperidine], Latex, Morphine and related, Oxycodone-acetaminophen, Tramadol, Buprenorphine hcl, Fentanyl, Strawberry extract, and Tapentadol  Family History  Problem Relation Age of Onset   Stroke Mother    Depression Mother    Breast cancer Mother 82   Breast cancer Maternal Aunt 63   Alcohol abuse Father    Stroke Father    Hypertension Father     Social History Social History   Tobacco Use   Smoking status: Former   Smokeless tobacco: Never  Scientific laboratory technician Use: Never used  Substance Use Topics   Alcohol use: Yes    Comment: 3 none last 24hrs   Drug use: No    Review of Systems  Constitutional: No fever/chills Eyes: No visual changes. ENT: No sore throat. Respiratory: Denies cough Cardiovascular: Denies chest pain Gastrointestinal: Denies abdominal pain Genitourinary: Negative for dysuria. Musculoskeletal: Negative for back pain. Neuro: Positive facial droop and drooling Skin: Negative for rash. Psychiatric: no mood changes,     ____________________________________________   PHYSICAL EXAM:  VITAL SIGNS: ED Triage Vitals  Enc Vitals Group     BP 06/03/21 1405 (!) 191/80     Pulse Rate 06/03/21 1405 (!) 55     Resp 06/03/21 1405 18     Temp 06/03/21 1406 98.1 F (36.7 C)     Temp Source 06/03/21 1406 Oral     SpO2 06/03/21 1405 96 %     Weight --      Height --      Head Circumference --      Peak Flow --      Pain Score --      Pain Loc --      Pain Edu? --      Excl. in [place]? --     Constitutional: Alert and oriented. Well appearing and in no acute distress. Eyes: Conjunctivae are normal. perrl Head: Large hematoma noted to the left side of the forehead, left-sided facial droop noted, patient is drooling Nose: No congestion/rhinnorhea. Mouth/Throat: Mucous membranes are moist.   Neck:  supple no lymphadenopathy noted Cardiovascular: Normal rate, regular rhythm. Heart sounds  are normal Respiratory: Normal respiratory effort.  No retractions, lungs c t a  Abd: soft nontender bs normal all 4 quad GU: deferred Musculoskeletal: FROM all extremities, warm and well perfused Neurologic:  Normal speech and language.  Patient has left-sided facial droop, patient cannot smile or frown on the left side, grips are equal bilaterally, 5/5 strength in lower extremities bilaterally Skin:  Skin is warm, dry and intact.  No rash noted. Psychiatric: Mood and affect are normal. Speech and behavior are normal.  ____________________________________________   LABS (all labs ordered are listed, but only abnormal results are displayed)  Labs Reviewed  COMPREHENSIVE METABOLIC PANEL - Abnormal; Notable for the following components:      Result Value   Glucose, Bld 107 (*)    All other components within normal limits  RESP PANEL BY RT-PCR (FLU A&B, COVID) ARPGX2  PROTIME-INR  APTT  CBC  DIFFERENTIAL  HEMOGLOBIN A1C  LIPID PANEL  CBG MONITORING, ED  TROPONIN I (HIGH SENSITIVITY)  TROPONIN I (HIGH SENSITIVITY)   ____________________________________________   ____________________________________________  RADIOLOGY  CT of the head CTA head and neck MRI  ____________________________________________   PROCEDURES  Procedure(s) performed:   .Critical Care E&M  Date/Time: 06/03/2021 6:26 PM Performed by: Versie Starks, PA-C  Critical care provider statement:    Critical care time (minutes):  45   Critical care time was exclusive of:  Separately billable procedures and treating other patients   Critical care was necessary to treat or prevent imminent or life-threatening deterioration of the following conditions:  CNS failure or compromise   Critical care was time spent personally by me on the following activities:  Blood draw for specimens, development of treatment plan with patient or surrogate, evaluation of patient's response to treatment, examination of patient,  obtaining history from patient or surrogate, ordering and performing treatments and interventions, ordering and review of laboratory studies, ordering and review of radiographic studies, re-evaluation of patient's condition and review of old charts After initial E/M assessment, critical care services were subsequently performed that were exclusive of separately billable procedures or treatment.      ____________________________________________   INITIAL IMPRESSION / [place] / ED COURSE  Pertinent labs & imaging results that were available during my care of the patient were reviewed by me and considered in my medical decision making (see chart for details).   The patient is an 80 year old female presents with strokelike symptoms after a fall.  See HPI.  Physical exam shows patient is stable at this time.  Last known well was 8 AM  DDx: CVA, TIA, Bell's palsy, subdural, subarachnoid hemorrhage  Cbc  is normal, comprehensive metabolic panel is normal, PT PTT are both normal   Ct head reviewed by me confirmed by radiology to be neg for stroke Cta head and neck shows stenosis but no occlusion Mri brain  Ekg shows sinus bradycardia, see physician read Cxr reviewed by me, no acute abnormality  With CTA being negative will admit patient for stroke, asa 325 ordered  Consult to hospitalist Dr. Tobie Poet will be admitting patient   Kelly Krause was evaluated in Emergency Department on 06/03/2021 for the symptoms described in the history of present illness. She was evaluated in the context of the global COVID-19 pandemic, which necessitated consideration that the patient might be at risk for infection with the SARS-CoV-2 virus that causes COVID-19. Institutional protocols and algorithms that pertain to the evaluation of patients at risk for COVID-19 are in a state of rapid change based on information released by regulatory bodies including the CDC and federal and state organizations.  These policies and algorithms were followed during the patient's care in the ED.    As part of my medical decision making, I reviewed the following data within the [place] History obtained from family, Nursing notes reviewed and incorporated, Labs reviewed , EKG interpreted sinus bradycardia, Old EKG reviewed, Old  chart reviewed, Radiograph reviewed , Discussed with admitting physician , Notes from prior ED visits, and [place] Controlled Substance Database  ____________________________________________   FINAL CLINICAL IMPRESSION(S) / ED DIAGNOSES  Final diagnoses:  Slurred speech  Facial droop due to acute stroke ([place])      NEW MEDICATIONS STARTED DURING THIS VISIT:  New Prescriptions   No medications on file     Note:  This document was prepared using Dragon voice recognition software and may include unintentional dictation errors.    Versie Starks, PA-C 06/03/21 Verdis Frederickson, MD 06/03/21 [phone]

## 2021-06-03 NOTE — ED Notes (Signed)
MRI unable to transport patient to floor after scan. Will have patient transported to floor upon return to ED.

## 2021-06-03 NOTE — ED Triage Notes (Addendum)
Pt comes into the ED via EMS from home with c/o waking up feeling dizzy and had a syncopal episode in the BR, states she had difficulty with her speech. Denies injury, pt does c/o right sided HA.. no facial droop or weakness.  [id number] 193/87 61HR 97%RA #[id number]

## 2021-06-03 NOTE — H&P (Addendum)
History and Physical   Kelly Krause GYJ:[id number] DOB: 1941-01-08 DOA: 06/03/2021  PCP: Sallee Lange, NP  Outpatient Specialists: Dr. Vickki Muff, podiatry Patient coming from: Home via EMS  I have personally briefly reviewed patient's old medical records in [place].  Chief Concern: Syncopal episode  HPI: Kelly Krause is a 80 y.o. female with medical history significant for hypertension, history of stage IV colon cancer with lung metastasis, hyperlipidemia, history of SVT, history of paroxysmal atrial fibrillation, depression, GERD, history of esophageal carcinoma, presents to the emergency department from home via EMS for chief concerns of syncopal event.  At bedside patient was able to tell me her full name, age, location of hospital, current calendar year.  There is no facial drooping or slurring of speech at bedside.  She reports that she woke up, felt dizzy, ambulated to bathroom, felt dizzy and fell and hit her head.  She denies fever, chest pain, shortness of breath, diarrhea, dysuria. She denies changes to medications, diet.   Social history: She lives with her husband. She denies tobacco, recreational drugs. She endorses infrequent etoh, last drink was 06/02/21, 2-4 oz of wine. 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   Vaccination history: She is vaccinated for covid 19, 4 doses, Pfizer  ROS: Constitutional: no weight change, no fever ENT/Mouth: no sore throat, no rhinorrhea Eyes: no eye pain, no vision changes Cardiovascular: no chest pain, no dyspnea,  no edema, no palpitations Respiratory: no cough, no sputum, no wheezing Gastrointestinal: no nausea, no vomiting, no diarrhea, no constipation Genitourinary: no urinary incontinence, no dysuria, no hematuria Musculoskeletal: no arthralgias, no myalgias Skin: no skin lesions, no pruritus, Neuro: + weakness, + loss of consciousness, + syncope Psych: no anxiety, no depression,  no decrease appetite Heme/Lymph: no bruising, no bleeding  ED Course: Discussed with emergency medicine provider, patient requiring hospitalization for chief concerns of stroke.  Vitals in the emergency department was remarkable for temperature 98.1, respiration rate of 18, heart rate 55, initial blood pressure 191/80, SPO2 of 97% on room air.  Labs in the emergency department was remarkable for sodium 138, potassium 3.8, chloride 99, BUN 17, serum creatinine of 0.83, nonfasting blood glucose 107, WBC 7.7, hemoglobin 12.6, platelets 226, troponin 7.  CT of the head was read as no acute intracranial abnormalities.  Soft tissue swelling with small scalp hematoma overlying the left frontal bone.  Moderate chronic microvascular ischemic changes and cerebral volume loss.  Mild multilevel degenerative disc and facet arthropathy of the cervical spine.  Assessment/Plan  Principal Problem:   Stroke [facility]) Active Problems:   Clinical depression   Essential (primary) hypertension   Cancer of esophagus (HCC)   Cancer of colon (HCC)   SCC (squamous cell carcinoma of esophagus) (HCC)   Atrial fibrillation (HCC)   Cancer of cecum (HCC)   Hypertensive urgency   Sinus bradycardia   # Dizziness and syncope # Stroke-like symptoms - EDP gave 1 dose of 324 mg of aspirin - AM team to consult neurology for further recommendations - Complete echo ordered - MRI of the brain ordered and was read as acute cortical/subcortical infarct of the right frontal operculum and right insula, encompassing 2.2 cm.  Additional tiny acute infarcts elsewhere within the right frontal operculum and right insula. - Fasting lipid and A1c ordered - Permissive hypertension, SBP goal of less than 220 - Frequent neuro vascular checks - Fall precaution  - Atorvastatin 80 mg one-time dose ordered to be given at  this time - I consulted telemetry neurologist for consultation regarding a possible thrombectomy and per telemetry  neurologist, her stroke score is a 0 no indication for thrombectomy at this time - Hydralazine 5 mg IV every 4 hours as needed for SBP greater than 220 - He recommends loading dose of Plavix 300 mg once  # Acute trace subdural hematoma overlying the anterior left frontal and temporal lobes-secondary to mechanical fall  # History of atrial fibrillation-currently not on anticoagulation - A.m. team to consult cardiology for consideration of reinitiation of anticoagulation - Chadvas2 score is 6, age, gender, hypertension, stroke  # Sinus bradycardia  # Hypertension-elevated at this time, as above for permissive hypertension - Hydralazine IV 5 mg every 4 hours as needed for SBP greater than 220 - Did not resume home medication of ramipril 10 mg, sotalol 80 mg twice daily, amlodipine 10 mg daily - Patient did endorse that she took all of her a.m. blood pressure medications prior to presentation to the ED - One dose of labetalol 5 mg IV was ordered and given at 1918 after CT of the head was negative for stroke and MRI was pending and at bedside patient did not have any stroke deficits  # Depression/anxiety-bupropion 150 mg twice daily ordered, sertraline 100 mg daily ordered  # GERD-PPI resumed  # DVT prophylaxis-pharmacologic DVT prophylaxis has not been ordered due to hematoma secondary to mechanical fall - A.m. team to order when appropriate  Chart reviewed.   DVT prophylaxis: SCDs Code Status: Full code Diet: Heart healthy Family Communication: Updated son, Dr.Gregory Eppard at [phone] Disposition Plan: pending clinical course Consults called: None at this time Admission status: Progressive cardiac, observation, telemetry  Past Medical History:  Diagnosis Date   Allergy    Atrial fibrillation ([place])    Cancer ([place])    esophagus/chemo and rad   Colon cancer ([place])    chemo/rad 15 yrs   Colon cancer ([place])    2 yrs ago   DDD (degenerative disc disease), cervical     Dysrhythmia    Elevated lipids    GERD (gastroesophageal reflux disease)    Hyperlipidemia    Hypertension    Lung cancer ([place])    chemo   Lung cancer ([place])    Major depressive disorder, recurrent episode, moderate ([place])    Persistent disorder of initiating or maintaining sleep    Personal history of chemotherapy    Personal history of radiation therapy    colon   SVT (supraventricular tachycardia) ([place])    Past Surgical History:  Procedure Laterality Date   ABDOMINAL HYSTERECTOMY     ATRIAL FIBRILLATION ABLATION     BUNIONECTOMY     CAPSULOTOMY METATARSOPHALANGEAL Left 04/19/2017   Procedure: CAPSULOTOMY METATARSOPHALANGEAL;  Surgeon: Samara Deist, DPM;  Location: [place];  Service: Podiatry;  Laterality: Left;   COLECTOMY     COLONOSCOPY WITH PROPOFOL N/A 09/26/2017   Procedure: COLONOSCOPY WITH PROPOFOL;  Surgeon: Lollie Sails, MD;  Location: [facility] ENDOSCOPY;  Service: Endoscopy;  Laterality: N/A;   ESOPHAGOGASTRODUODENOSCOPY N/A 04/21/2015   Procedure: ESOPHAGOGASTRODUODENOSCOPY (EGD);  Surgeon: Lollie Sails, MD;  Location[facility] ENDOSCOPY;  Service: Endoscopy;  Laterality: N/A;   Esophgeal cancer     HAMMER TOE SURGERY Left 04/19/2017   Procedure: HAMMER TOE CORRECTION-3RD & 4TH;  Surgeon: Samara Deist, DPM;  Location: [place];  Service: Podiatry;  Laterality: Left;   LOBECTOMY Right    METATARSAL OSTEOTOMY Left 04/19/2017   Procedure: PHALANX OSTEOTOMY-AKIN -  LEFT;  Surgeon: Samara Deist, DPM;  Location: [place];  Service: Podiatry;  Laterality: Left;   PARTIAL HYSTERECTOMY     SINUSOTOMY     Social History:  reports that she has quit smoking. She has never used smokeless tobacco. She reports current alcohol use. She reports that she does not use drugs.  Allergies  Allergen Reactions   Amoxicillin-Pot Clavulanate Itching, Nausea Only and Other (See Comments)    Has patient had a PCN reaction causing immediate rash,  facial/tongue/throat swelling, SOB or lightheadedness with hypotension: No Has patient had a PCN reaction causing severe rash involving mucus membranes or skin necrosis: No Has patient had a PCN reaction that required hospitalization: No Has patient had a PCN reaction occurring within the last 10 years: Yes If all of the above answers are "NO", then may proceed with Cephalosporin use.    Codeine Nausea Only and Other (See Comments)    GI Upset headaches   Demerol [Meperidine] Itching, Nausea Only and Other (See Comments)    GI Upset, Headaches    Latex Hives and Itching   Morphine And Related Itching, Nausea Only and Other (See Comments)    Headaches, pt can still take    Oxycodone-Acetaminophen Itching and Nausea Only   Tramadol Other (See Comments)     GI Upset   Buprenorphine Hcl Itching, Nausea Only and Other (See Comments)    Headaches   Fentanyl Itching and Nausea And Vomiting   Strawberry Extract Nausea And Vomiting and Rash   Tapentadol Rash   Family History  Problem Relation Age of Onset   Stroke Mother    Depression Mother    Breast cancer Mother 28   Breast cancer Maternal Aunt 44   Alcohol abuse Father    Stroke Father    Hypertension Father    Family history: Family history reviewed and not pertinent  Prior to Admission medications   Medication Sig Start Date End Date Taking? Authorizing Provider  amLODipine (NORVASC) 10 MG tablet TAKE ONE TABLET BY MOUTH ONCE DAILY-NEED  APPOINTMENT Patient taking differently: Take 10 mg daily by mouth.  11/01/16   Juline Patch, MD  aspirin EC 81 MG tablet Take 81 mg daily by mouth.    [provider]  bacitracin ointment Apply 1 application topically 2 (two) times daily. Until healed 07/09/19   Karen Kitchens, NP  BIOTIN PO Take 1 tablet by mouth daily.    [provider]  buPROPion (WELLBUTRIN SR) 150 MG 12 hr tablet Take 1 tablet (150 mg total) by mouth 2 (two) times daily. 11/01/16   Juline Patch, MD   cetirizine (ZYRTEC) 10 MG tablet Take 1 tablet (10 mg total) by mouth 2 (two) times daily. otc Patient taking differently: Take 10 mg daily by mouth.  11/01/16   Juline Patch, MD  Cholecalciferol (VITAMIN D-3) 1000 UNITS CAPS Take 1,000 Units daily by mouth.     [provider]  diphenhydrAMINE (BENADRYL) 25 mg capsule Take 50-75 mg every 6 (six) hours as needed by mouth for itching.     [provider]  doxycycline (VIBRAMYCIN) 100 MG capsule Take 1 capsule (100 mg total) by mouth 2 (two) times daily. 07/09/19   Karen Kitchens, NP  fluticasone (FLONASE) 50 MCG/ACT nasal spray USE ONE SPRAY(S) IN EACH NOSTRIL ONCE DAILY Patient taking differently: Place 2 sprays daily into both nostrils.  11/01/16   Juline Patch, MD  Multiple Vitamins-Minerals (MULTIVITAMIN PO) Take 1  tablet daily by mouth.    [provider]  pantoprazole (PROTONIX) 40 MG tablet Take 1 tablet (40 mg total) by mouth 2 (two) times daily. 11/01/16   Juline Patch, MD  ramipril (ALTACE) 10 MG capsule TAKE ONE CAPSULE BY MOUTH ONCE DAILY. MUST SCHEDULE APPOINTMENT FOR NOVEMBER Patient taking differently: Take 10 mg daily by mouth.  11/01/16   Juline Patch, MD  senna-docusate (SENOKOT-S) 8.6-50 MG tablet Take 2 tablets daily by mouth.    [provider]  sertraline (ZOLOFT) 25 MG tablet Take 1 tablet (25 mg total) by mouth daily. 11/01/16   Juline Patch, MD  simvastatin (ZOCOR) 40 MG tablet Take 1 tablet (40 mg total) by mouth daily. 11/01/16   Juline Patch, MD  sotalol (BETAPACE) 80 MG tablet Take 1 tablet (80 mg total) by mouth daily. Patient taking differently: Take 80 mg 2 (two) times daily by mouth.  11/22/16   Juline Patch, MD  hydrochlorothiazide (HYDRODIURIL) 25 MG tablet TAKE 1 TABLET BY MOUTH ONCE DAILY 10/16/17 07/09/19  Juline Patch, MD  sucralfate (CARAFATE) 1 GM/10ML suspension Take by mouth. Cancer Doc 10/20/15 07/09/19  [provider]   Physical  Exam: Vitals:   06/03/21 1900 06/03/21 1913 06/03/21 1950 06/03/21 2004  BP: (!) 208/93  (!) 200/74 (!) 192/80  Pulse: (!) 55  (!) 53 (!) 54  Resp:   18   Temp:      TempSrc:      SpO2: 98%  95% 96%  Weight:  86.6 kg    Height:  5\' 7"  (1.702 m)     Constitutional: appears age-appropriate, NAD, calm, comfortable Eyes: PERRL, lids and conjunctivae normal ENMT: Mucous membranes are moist. Posterior pharynx clear of any exudate or lesions. Age-appropriate dentition. Hearing appropriate Neck: normal, supple, no masses, no thyromegaly Respiratory: clear to auscultation bilaterally, no wheezing, no crackles. Normal respiratory effort. No accessory muscle use.  Cardiovascular: Regular rate and rhythm, no murmurs / rubs / gallops. No extremity edema. 2+ pedal pulses. No carotid bruits.  Abdomen: no tenderness, no masses palpated, no hepatosplenomegaly. Bowel sounds positive.  Musculoskeletal: no clubbing / cyanosis. No joint deformity upper and lower extremities. Good ROM, no contractures, no atrophy. Normal muscle tone.  Skin: no rashes, lesions, ulcers. No induration Neurologic: Sensation intact. Strength 5/5 in all 4.  Psychiatric: Normal judgment and insight. Alert and oriented x 3. Normal mood.   EKG: independently reviewed, showing sinus bradycardia with rate of 54, QTc 428  Chest x-ray on Admission: I personally reviewed and I agree with radiologist reading as below.  CT ANGIO HEAD NECK W WO CM  Result Date: 06/03/2021 CLINICAL DATA:  Stroke/TIA. EXAM: CT ANGIOGRAPHY HEAD AND NECK TECHNIQUE: Multidetector CT imaging of the head and neck was performed using the standard protocol during bolus administration of intravenous contrast. Multiplanar CT image reconstructions and MIPs were obtained to evaluate the vascular anatomy. Carotid stenosis measurements (when applicable) are obtained utilizing NASCET criteria, using the distal internal carotid diameter as the denominator. CONTRAST:  75mL  OMNIPAQUE IOHEXOL 350 MG/ML SOLN COMPARISON:  None. FINDINGS: CTA NECK FINDINGS Aortic arch: Great vessel origins are patent. Approximately 40% stenosis of the left subclavian artery origin due to atherosclerosis. Right carotid system: Mixed calcific and noncalcific atherosclerosis at the carotid bifurcation without greater than 50% stenosis. Retropharyngeal course of the ICA. Left carotid system: Atherosclerosis of the common carotid artery and carotid bifurcation without greater than 50% stenosis. Vertebral arteries: Moderate right and  severe left vertebral artery origin stenosis due to atherosclerosis. Remainder of the vertebral arteries are patent without significant stenosis. Skeleton: Evaluated on same day CT cervical spine and CT head. Other neck: No acute abnormality. Upper chest: Tree in bud nodular opacities in the lateral left upper lobe (see series 6, image 4). Biapical pleuroparenchymal scarring. Review of the MIP images confirms the above findings CTA HEAD FINDINGS Anterior circulation: Bilateral intracranial ICAs, MCAs, and ACAs are patent. aSevere right proximal A2 ACA stenosis with more distal moderate narrowing. No aneurysm identified. Posterior circulation: Bilateral intradural vertebral arteries, the basilar artery, and the cerebral arteries are patent. Moderate stenosis of the proximal left P2 PCA. Small left P1 PCA with left posterior communicating artery, anatomic variant. No aneurysm identified. Venous sinuses: As permitted by contrast timing, patent. Bilateral transverse sinus arachnoid granulations. Review of the MIP images confirms the above findings IMPRESSION: CTA Head: 1. No large vessel occlusion. 2. Severe right proximal A2 ACA stenosis with more distal moderate ACA narrowing. 3. Moderate proximal left P2 PCA stenosis. CTA Neck: 1. Moderate right and severe left vertebral artery origin stenosis. Remainder of the vertebral arteries are patent without significant stenosis. 2.  Approximately 40% stenosis of the left subclavian artery origin. 3. Bilateral carotid bifurcation atherosclerosis without greater than 50% stenosis. 4. Tree in bud nodular opacities in the lateral left upper lobe, most likely infectious/inflammatory. Recommend dedicated chest imaging. Electronically Signed   By: Margaretha Sheffield MD   On: 06/03/2021 17:55   CT HEAD WO CONTRAST  Result Date: 06/03/2021 CLINICAL DATA:  Head and neck pain after fall, left frontal injury. Syncope EXAM: CT HEAD WITHOUT CONTRAST CT CERVICAL SPINE WITHOUT CONTRAST TECHNIQUE: Multidetector CT imaging of the head and cervical spine was performed following the standard protocol without intravenous contrast. Multiplanar CT image reconstructions of the cervical spine were also generated. COMPARISON:  01/11/2012, 07/22/2010 FINDINGS: CT HEAD FINDINGS Brain: No evidence of acute infarction, hemorrhage, hydrocephalus, extra-axial collection or mass lesion/mass effect. Moderate low-density changes within the periventricular and subcortical white matter compatible with chronic microvascular ischemic change. Mild diffuse cerebral volume loss. Vascular: No hyperdense vessel or unexpected calcification. Skull: Normal. Negative for fracture or focal lesion. Sinuses/Orbits: Mild mucosal thickening within the right sphenoid sinus. Orbital structures appear unremarkable. Other: Soft tissue swelling with small scalp hematoma overlying the left frontal bone measuring 1.9 x 0.5 x 1.5 cm. CT CERVICAL SPINE FINDINGS Alignment: Facet joints are aligned without dislocation or traumatic listhesis. Dens and lateral masses are aligned. Straightening with slight reversal of the cervical lordosis. Skull base and vertebrae: No acute fracture. No primary bone lesion or focal pathologic process. Bilateral C2-3 facet joints are fused. Soft tissues and spinal canal: No prevertebral fluid or swelling. No visible canal hematoma. Slight medial course of the right internal  carotid artery. Disc levels: Mild multilevel disc height loss with endplate spurring. Left greater than right multilevel facet arthropathy, most pronounced at C3-4 and C4-5. Upper chest: Right apical pleuroparenchymal scarring. Other: None. IMPRESSION: 1. No acute intracranial findings. 2. Soft tissue swelling with small scalp hematoma overlying the left frontal bone. 3. No evidence of acute fracture or traumatic listhesis of the cervical spine. 4. Moderate chronic microvascular ischemic changes and cerebral volume loss. 5. Mild multilevel degenerative disc and facet arthropathy of the cervical spine. Electronically Signed   By: Davina Poke D.O.   On: 06/03/2021 15:07   CT CERVICAL SPINE WO CONTRAST  Result Date: 06/03/2021 CLINICAL DATA:  Head and neck pain after  fall, left frontal injury. Syncope EXAM: CT HEAD WITHOUT CONTRAST CT CERVICAL SPINE WITHOUT CONTRAST TECHNIQUE: Multidetector CT imaging of the head and cervical spine was performed following the standard protocol without intravenous contrast. Multiplanar CT image reconstructions of the cervical spine were also generated. COMPARISON:  01/11/2012, 07/22/2010 FINDINGS: CT HEAD FINDINGS Brain: No evidence of acute infarction, hemorrhage, hydrocephalus, extra-axial collection or mass lesion/mass effect. Moderate low-density changes within the periventricular and subcortical white matter compatible with chronic microvascular ischemic change. Mild diffuse cerebral volume loss. Vascular: No hyperdense vessel or unexpected calcification. Skull: Normal. Negative for fracture or focal lesion. Sinuses/Orbits: Mild mucosal thickening within the right sphenoid sinus. Orbital structures appear unremarkable. Other: Soft tissue swelling with small scalp hematoma overlying the left frontal bone measuring 1.9 x 0.5 x 1.5 cm. CT CERVICAL SPINE FINDINGS Alignment: Facet joints are aligned without dislocation or traumatic listhesis. Dens and lateral masses are  aligned. Straightening with slight reversal of the cervical lordosis. Skull base and vertebrae: No acute fracture. No primary bone lesion or focal pathologic process. Bilateral C2-3 facet joints are fused. Soft tissues and spinal canal: No prevertebral fluid or swelling. No visible canal hematoma. Slight medial course of the right internal carotid artery. Disc levels: Mild multilevel disc height loss with endplate spurring. Left greater than right multilevel facet arthropathy, most pronounced at C3-4 and C4-5. Upper chest: Right apical pleuroparenchymal scarring. Other: None. IMPRESSION: 1. No acute intracranial findings. 2. Soft tissue swelling with small scalp hematoma overlying the left frontal bone. 3. No evidence of acute fracture or traumatic listhesis of the cervical spine. 4. Moderate chronic microvascular ischemic changes and cerebral volume loss. 5. Mild multilevel degenerative disc and facet arthropathy of the cervical spine. Electronically Signed   By: Davina Poke D.O.   On: 06/03/2021 15:07   DG Chest Portable 1 View  Result Date: 06/03/2021 CLINICAL DATA:  Recent syncopal episode, initial encounter EXAM: PORTABLE CHEST 1 VIEW COMPARISON:  10/03/2017 CT FINDINGS: Cardiac shadow is within normal limits. Right chest wall port is noted in satisfactory position. Mild scarring is noted in the right lung similar to that seen on prior exam. Postsurgical changes are seen in the right base. No acute infiltrate or sizable effusion is noted. IMPRESSION: Chronic scarring in the right lung base. No acute abnormality noted. Electronically Signed   By: Inez Catalina M.D.   On: 06/03/2021 19:36    Labs on Admission: I have personally reviewed following labs  CBC: Recent Labs  Lab 06/03/21 1412  WBC 7.7  NEUTROABS 4.8  HGB 12.6  HCT 38.2  MCV 97.9  PLT 009   Basic Metabolic Panel: Recent Labs  Lab 06/03/21 1412  NA 138  K 3.8  CL 99  CO2 31  GLUCOSE 107*  BUN 17  CREATININE 0.83   CALCIUM 9.1   GFR: Estimated Creatinine Clearance: 60.1 mL/min (by C-G formula based on SCr of 0.83 mg/dL). Liver Function Tests: Recent Labs  Lab 06/03/21 1412  AST 27  ALT 24  ALKPHOS 69  BILITOT 0.8  PROT 7.6  ALBUMIN 3.7   Coagulation Profile: Recent Labs  Lab 06/03/21 1412  INR 1.1   Dr. Tobie Poet [facility]  If 7PM-7AM, please contact overnight-coverage provider If 7AM-7PM, please contact day coverage provider [email or web address]  06/03/2021, 9:03 PM

## 2021-06-04 ENCOUNTER — Observation Stay (HOSPITAL_COMMUNITY)
Admit: 2021-06-04 | Discharge: 2021-06-04 | Disposition: A | Discharge status: Home / Self Care | Payer: Medicare HMO | Attending: Internal Medicine | Admitting: Internal Medicine

## 2021-06-04 ENCOUNTER — Observation Stay: Payer: Medicare HMO

## 2021-06-04 DIAGNOSIS — Z881 Allergy status to other antibiotic agents status: Secondary | ICD-10-CM | POA: Diagnosis not present

## 2021-06-04 DIAGNOSIS — M47812 Spondylosis without myelopathy or radiculopathy, cervical region: Secondary | ICD-10-CM | POA: Diagnosis present

## 2021-06-04 DIAGNOSIS — I63511 Cerebral infarction due to unspecified occlusion or stenosis of right middle cerebral artery: Secondary | ICD-10-CM | POA: Diagnosis not present

## 2021-06-04 DIAGNOSIS — Z8501 Personal history of malignant neoplasm of esophagus: Secondary | ICD-10-CM | POA: Diagnosis not present

## 2021-06-04 DIAGNOSIS — W19XXXA Unspecified fall, initial encounter: Secondary | ICD-10-CM | POA: Diagnosis not present

## 2021-06-04 DIAGNOSIS — I10 Essential (primary) hypertension: Secondary | ICD-10-CM | POA: Diagnosis not present

## 2021-06-04 DIAGNOSIS — I6200 Nontraumatic subdural hemorrhage, unspecified: Secondary | ICD-10-CM | POA: Diagnosis not present

## 2021-06-04 DIAGNOSIS — H53462 Homonymous bilateral field defects, left side: Secondary | ICD-10-CM | POA: Diagnosis present

## 2021-06-04 DIAGNOSIS — C7801 Secondary malignant neoplasm of right lung: Secondary | ICD-10-CM | POA: Diagnosis not present

## 2021-06-04 DIAGNOSIS — R059 Cough, unspecified: Secondary | ICD-10-CM | POA: Diagnosis not present

## 2021-06-04 DIAGNOSIS — R471 Dysarthria and anarthria: Secondary | ICD-10-CM | POA: Diagnosis present

## 2021-06-04 DIAGNOSIS — G8194 Hemiplegia, unspecified affecting left nondominant side: Secondary | ICD-10-CM | POA: Diagnosis not present

## 2021-06-04 DIAGNOSIS — F32A Depression, unspecified: Secondary | ICD-10-CM | POA: Diagnosis present

## 2021-06-04 DIAGNOSIS — Z20822 Contact with and (suspected) exposure to covid-19: Secondary | ICD-10-CM | POA: Diagnosis not present

## 2021-06-04 DIAGNOSIS — I6389 Other cerebral infarction: Secondary | ICD-10-CM

## 2021-06-04 DIAGNOSIS — Z923 Personal history of irradiation: Secondary | ICD-10-CM | POA: Diagnosis not present

## 2021-06-04 DIAGNOSIS — R29810 Facial weakness: Secondary | ICD-10-CM | POA: Diagnosis present

## 2021-06-04 DIAGNOSIS — S065X9A Traumatic subdural hemorrhage with loss of consciousness of unspecified duration, initial encounter: Secondary | ICD-10-CM | POA: Diagnosis not present

## 2021-06-04 DIAGNOSIS — I480 Paroxysmal atrial fibrillation: Secondary | ICD-10-CM | POA: Diagnosis present

## 2021-06-04 DIAGNOSIS — F419 Anxiety disorder, unspecified: Secondary | ICD-10-CM | POA: Diagnosis present

## 2021-06-04 DIAGNOSIS — Z85038 Personal history of other malignant neoplasm of large intestine: Secondary | ICD-10-CM | POA: Diagnosis not present

## 2021-06-04 DIAGNOSIS — K219 Gastro-esophageal reflux disease without esophagitis: Secondary | ICD-10-CM | POA: Diagnosis present

## 2021-06-04 DIAGNOSIS — I672 Cerebral atherosclerosis: Secondary | ICD-10-CM | POA: Diagnosis not present

## 2021-06-04 DIAGNOSIS — I639 Cerebral infarction, unspecified: Secondary | ICD-10-CM | POA: Diagnosis present

## 2021-06-04 DIAGNOSIS — J320 Chronic maxillary sinusitis: Secondary | ICD-10-CM | POA: Diagnosis not present

## 2021-06-04 DIAGNOSIS — S0003XA Contusion of scalp, initial encounter: Secondary | ICD-10-CM | POA: Diagnosis present

## 2021-06-04 DIAGNOSIS — I63411 Cerebral infarction due to embolism of right middle cerebral artery: Secondary | ICD-10-CM | POA: Diagnosis not present

## 2021-06-04 DIAGNOSIS — R4781 Slurred speech: Secondary | ICD-10-CM | POA: Diagnosis not present

## 2021-06-04 DIAGNOSIS — R29700 NIHSS score 0: Secondary | ICD-10-CM | POA: Diagnosis present

## 2021-06-04 DIAGNOSIS — E785 Hyperlipidemia, unspecified: Secondary | ICD-10-CM | POA: Diagnosis present

## 2021-06-04 DIAGNOSIS — I160 Hypertensive urgency: Secondary | ICD-10-CM | POA: Diagnosis not present

## 2021-06-04 LAB — HEMOGLOBIN A1C
Hgb A1c MFr Bld: 6.7 % — ABNORMAL HIGH (ref 4.8–5.6)
Mean Plasma Glucose: 145.59 mg/dL

## 2021-06-04 LAB — LIPID PANEL
Cholesterol: 179 mg/dL (ref 0–200)
HDL: 59 mg/dL (ref >40)
LDL Cholesterol: 103 mg/dL — ABNORMAL HIGH (ref 0–99)
Total CHOL/HDL Ratio: 3 RATIO
Triglycerides: 87 mg/dL (ref <150)
VLDL: 17 mg/dL (ref 0–40)

## 2021-06-04 LAB — TROPONIN I (HIGH SENSITIVITY): Troponin I (High Sensitivity): 8 ng/L (ref <18)

## 2021-06-04 LAB — ECHOCARDIOGRAM LIMITED BUBBLE STUDY
Area-P 1/2: 3.93 cm2
S' Lateral: 3.3 cm

## 2021-06-04 MED ORDER — HYDRALAZINE HCL 20 MG/ML IJ SOLN
10.0000 mg | INTRAMUSCULAR | Status: DC | PRN
  Administered 2021-06-04: 10 mg via INTRAVENOUS
  Filled 2021-06-04 (×2): qty 1

## 2021-06-04 MED ORDER — SOTALOL HCL 80 MG PO TABS
80.0000 mg | ORAL_TABLET | Freq: Two times a day (BID) | ORAL | Status: DC
  Administered 2021-06-04 – 2021-06-06 (×5): 80 mg via ORAL
  Filled 2021-06-04 (×6): qty 1

## 2021-06-04 MED ORDER — CHLORHEXIDINE GLUCONATE CLOTH 2 % EX PADS
6.0000 | MEDICATED_PAD | Freq: Every day | CUTANEOUS | Status: DC
  Administered 2021-06-04 – 2021-06-06 (×3): 6 via TOPICAL

## 2021-06-04 MED ORDER — AMLODIPINE BESYLATE 10 MG PO TABS
10.0000 mg | ORAL_TABLET | Freq: Every day | ORAL | Status: DC
  Administered 2021-06-04 – 2021-06-06 (×3): 10 mg via ORAL
  Filled 2021-06-04 (×3): qty 1

## 2021-06-04 NOTE — Evaluation (Signed)
Occupational Therapy Evaluation Patient Details Name: Kelly Krause MRN: [id number] DOB: 1940/11/26 Today's Date: 06/04/2021    History of Present Illness 80 y.o. female with medical history significant for HTN, history of stage IV colon cancer with lung metastasis, HLD, history of SVT, history of paroxysmal Afib, depression, GERD, history of esophageal carcinoma, DDD, presents to the ED after feeling dizzy when she got up this morning, subsequently took a fall and noticed speech difficulties as well as drooling out of the side of her face. Imaging + for acute infarct and has L subdural hematoma.   Clinical Impression   Pt seen for OT evaluation this date. Per pt/dtr's report, prior to hospital admission pt was independent in all aspects of ADL and mobility. No other additional falls. Pt performs majority of cooking, independent with med mgt, and spouse does majority of driving and dishes (pt/dtr report spouse has dementia but still drives well with GPS). Pt lives with her spouse in a one-level home with one step to enter and does have access to a 2WW if needed. Currently pt demonstrates impairments in LUE/LLE strength and coordination, mild L facial droop, balance, slurred speech, concern for LUQ visual deficits, and pt endorses difficulty with swallowing. Pt requires supervision for seated ADL, supervision to CGA for ADL in standing, CGA for ADL mobility without AD (from bed to recliner), PRN set up assist for packets during breakfast, and intermittent cues for safety while eating/drinking to minimize risk of choking/aspiration. SLP notified of swallowing difficulties and present for remainder of breakfast after OT eval. Pt is eager to return to his PLOF with increased independence and functional use of her L side. Pt/dtr educated in falls prevention, therapy role after CVA, strategies for improving safety during meal times and cooking tasks at home, reiterated neurologist's request not to drive. Pt  would benefit from skilled OT to address noted impairments and functional limitations (see below for any additional details) in order to maximize safety and independence while minimizing falls risk and caregiver burden. Upon hospital discharge, recommend [place] and initial 24/7 supervision/assist to maximize safety and return to PLOF.    Follow Up Recommendations  Home health OT;Other (comment) (initial 24/7 supervision/assist)    Equipment Recommendations  None recommended by OT    Recommendations for Other Services       Precautions / Restrictions Precautions Precautions: Fall Restrictions Weight Bearing Restrictions: No      Mobility Bed Mobility Overal bed mobility: Needs Assistance Bed Mobility: Supine to Sit     Supine to sit: Supervision          Transfers Overall transfer level: Needs assistance Equipment used: None Transfers: Sit to/from Stand Sit to Stand: Supervision              Balance Overall balance assessment: Needs assistance Sitting-balance support: No upper extremity supported;Feet supported Sitting balance-Leahy Scale: Good     Standing balance support: No upper extremity supported Standing balance-Leahy Scale: Fair                             ADL either performed or assessed with clinical judgement   ADL Overall ADL's : Needs assistance/impaired Eating/Feeding: Sitting;Cueing for safety;Supervision/ safety Eating/Feeding Details (indicate cue type and reason): PRN set up for more difficult packets, pt noting increased difficulty with bilat tasks using L hand, endorses increased difficulty wiht swallowing from baseline, educated in smaller bites/sips, lingual sweep, alternating food/drink; ultimately requested pt wait for more  formal swallow eval by SLP who was notified of deficits. (pt with hx of esophageal cancer and endorses her swallowing was getting more difficult prior to this admission too and felt it was near time to get  her esophagus "stretched" again)                                   General ADL Comments: generally supervision for safety, CGA in standing for bathing, dressing, toileting transfers; increased difficulty noted with bilat UE tasks     Vision Baseline Vision/History: Wears glasses Wears Glasses: At all times Patient Visual Report: No change from baseline Vision Assessment?: Yes Eye Alignment: Within Functional Limits Ocular Range of Motion: Within Functional Limits Alignment/Gaze Preference: Within Defined Limits Tracking/Visual Pursuits: Able to track stimulus in all quads without difficulty Visual Fields: Other (comment) Additional Comments: possible LUQ visual neglect, difficult to parse out formally     Perception     Praxis      Pertinent Vitals/Pain Pain Assessment: 0-10 Pain Score: 6  Pain Location: headache Pain Descriptors / Indicators: Headache Pain Intervention(s): Limited activity within patient's tolerance;Monitored during session;Premedicated before session;Repositioned     Hand Dominance Right   Extremity/Trunk Assessment Upper Extremity Assessment Upper Extremity Assessment: RUE deficits/detail;LUE deficits/detail RUE Deficits / Details: WFL, grossly at least 4+/5, finger to nose grossly normal RUE Sensation: WNL RUE Coordination: WNL LUE Deficits / Details: shoulder flex 4-/5, elbow flex/ext and grip 4/5, mild FMC deficits, finger to nose grossly normal, denies sensory deficits LUE Sensation: WNL LUE Coordination: decreased fine motor;decreased gross motor   Lower Extremity Assessment Lower Extremity Assessment: LLE deficits/detail;RLE deficits/detail RLE Deficits / Details: WFL, grossly at least 4+/5 RLE Sensation: WNL RLE Coordination: WNL LLE Deficits / Details: grossly 4/5, denies sensory deficits LLE Sensation: WNL LLE Coordination: WNL   Cervical / Trunk Assessment Cervical / Trunk Assessment: Normal   Communication  Communication Communication: Expressive difficulties (slurred speech, some mild word finding difficulties)   Cognition Arousal/Alertness: Awake/alert Behavior During Therapy: WFL for tasks assessed/performed Overall Cognitive Status: Impaired/Different from baseline Area of Impairment: Problem solving                             Problem Solving: Slow processing General Comments: alert and oriented, difficulty wiht word finding when asked about name of hospital but with visual cue able to state, slightly delayed processing, questionable LUQ visual neglect   General Comments       Exercises Other Exercises Other Exercises: Pt/dtr educated in falls prevention, therapy role after CVA, strategies for improving safety during meal times and cooking tasks at home, reiterated neurologist's request not to drive   Shoulder Instructions      Home Living Family/patient expects to be discharged to:: Private residence Living Arrangements: Spouse/significant other Available Help at Discharge: Family;Available 24 hours/day;Available PRN/intermittently (spouse w/ dementia (still drives w/ GPS), daughter ~54min away and stops by 1x/wk) Type of Home: House Home Access: Stairs to enter [employer] of Steps: 1 Entrance Stairs-Rails: None Home Layout: One level     Bathroom Shower/Tub: Tub/shower unit;Walk-in shower   Bathroom Toilet: Standard     Home Equipment: Shower seat - built in;Walker - 2 wheels;Grab bars - tub/shower          Prior Functioning/Environment Level of Independence: Independent        Comments: Pt indep, only 1 fall just  prior to this admission, pt does all cooking, spouse does dishes and majority of driving, pt has a cleaning service for other housekeeping tasks        OT Problem List: Decreased strength;Decreased coordination;Pain;Impaired balance (sitting and/or standing);Decreased knowledge of use of DME or AE;Impaired vision/perception       OT Treatment/Interventions: Self-care/ADL training;Therapeutic exercise;Therapeutic activities;Neuromuscular education;DME and/or AE instruction;Patient/family education;Balance training;Visual/perceptual remediation/compensation    OT Goals(Current goals can be found in the care plan section) Acute Rehab OT Goals Patient Stated Goal: get better OT Goal Formulation: With patient/family Time For Goal Achievement: 06/18/21 Potential to Achieve Goals: Good ADL Goals Pt Will Perform Lower Body Dressing: with modified independence;sit to/from stand Pt Will Transfer to Toilet: with modified independence;ambulating (LRAD) Additional ADL Goal #1: Pt will perform medication mgt with modified independence.  OT Frequency: Min 2X/week   Barriers to D/C:            Co-evaluation              AM-PAC OT "6 Clicks" Daily Activity     Outcome Measure Help from another person eating meals?: A Little Help from another person taking care of personal grooming?: A Little Help from another person toileting, which includes using toliet, bedpan, or urinal?: A Little Help from another person bathing (including washing, rinsing, drying)?: A Little Help from another person to put on and taking off regular upper body clothing?: A Little Help from another person to put on and taking off regular lower body clothing?: A Little 6 Click Score: 18   End of Session    Activity Tolerance: Patient tolerated treatment well Patient left: in chair;with call bell/phone within reach;with chair alarm set;with family/visitor present  OT Visit Diagnosis: Hemiplegia and hemiparesis Hemiplegia - Right/Left: Left Hemiplegia - dominant/non-dominant: Non-Dominant Hemiplegia - caused by: Cerebral infarction                Time: 4801-6553 OT Time Calculation (min): 48 min Charges:  OT General Charges $OT Visit: 1 Visit OT Evaluation $OT Eval Moderate Complexity: 1 Mod OT Treatments $Self Care/Home Management :  23-37 mins  Hanley Hays, MPH, MS, OTR/L ascom [phone] 06/04/21, 12:48 PM

## 2021-06-04 NOTE — Evaluation (Signed)
Speech Language Pathology Evaluation Patient Details Name: Kelly Krause MRN: [id number] DOB: 1940-12-06 Today's Date: 06/04/2021 Time: 2542-7062 SLP Time Calculation (min) (ACUTE ONLY): 30 min  Problem List:  Patient Active Problem List   Diagnosis Date Noted   Acute CVA (cerebrovascular accident) ([place]) 06/04/2021   Stroke ([place]) 06/03/2021   Hypertensive urgency 06/03/2021   Sinus bradycardia 06/03/2021   Cancer of cecum ([place]) 10/04/2017   Normocytic anemia 10/04/2017   A-fib ([place]) 06/16/2015   Cancer of colon ([place]) 06/16/2015   DD (diverticular disease) 06/16/2015   Esophageal ulcer 06/16/2015   Hallux abducto valgus 06/16/2015   Hammer toe 06/16/2015   H/O varicella 06/16/2015   HK (hyperkeratosis) 06/16/2015   BP (high blood pressure) 06/16/2015   Neuritis or radiculitis due to rupture of lumbar intervertebral disc 06/16/2015   Cancer of lung ([place]) 06/16/2015   Degenerative arthritis of spine 06/16/2015   SCC (squamous cell carcinoma of esophagus) ([place]) 06/16/2015   Supraventricular tachycardia ([place]) 06/16/2015   Temporary cerebral vascular dysfunction 06/16/2015   Familial multiple lipoprotein-type hyperlipidemia 03/27/2015   Clinical depression 03/27/2015   Acid reflux 03/27/2015   CN (constipation) 03/27/2015   Ulcerative cystitis 03/27/2015   Routine general medical examination at a health care facility 03/27/2015   Allergic rhinitis 03/27/2015   H/O malignant neoplasm 03/27/2015   Contusion of knee and lower leg 03/27/2015   Narrowing of intervertebral disc space 03/27/2015   DDD (degenerative disc disease), cervical 03/27/2015   DDD (degenerative disc disease), lumbar 03/27/2015   H/O: depression 03/27/2015   Can't get food down 03/27/2015   Essential (primary) hypertension 03/27/2015   Cephalalgia 03/27/2015   Discitis of lumbar region 03/27/2015   Blood in the urine 03/27/2015   H/O: HTN (hypertension) 03/27/2015   Menopause 03/27/2015   Screening for  depression 03/27/2015   Sinus infection 03/27/2015   FOM (frequency of micturition) 03/27/2015   Arrhythmia, ventricular 03/27/2015   Encounter for mammogram to establish baseline mammogram 03/27/2015   Atrial tachycardia ([place]) 03/27/2015   Lumbar and sacral osteoarthritis 11/03/2014   Atrial fibrillation ([place]) 09/18/2014   Cancer of esophagus ([place]) 06/25/2014   Malignant neoplasm of esophagus ([place]) 06/25/2014   Past Medical History:  Past Medical History:  Diagnosis Date   Allergy    Atrial fibrillation ([place])    Cancer ([place])    esophagus/chemo and rad   Colon cancer ([place])    chemo/rad 15 yrs   Colon cancer ([place])    2 yrs ago   DDD (degenerative disc disease), cervical    Dysrhythmia    Elevated lipids    GERD (gastroesophageal reflux disease)    Hyperlipidemia    Hypertension    Lung cancer ([place])    chemo   Lung cancer ([place])    Major depressive disorder, recurrent episode, moderate ([place])    Persistent disorder of initiating or maintaining sleep    Personal history of chemotherapy    Personal history of radiation therapy    colon   SVT (supraventricular tachycardia) ([place])    Past Surgical History:  Past Surgical History:  Procedure Laterality Date   ABDOMINAL HYSTERECTOMY     ATRIAL FIBRILLATION ABLATION     BUNIONECTOMY     CAPSULOTOMY METATARSOPHALANGEAL Left 04/19/2017   Procedure: CAPSULOTOMY METATARSOPHALANGEAL;  Surgeon: Samara Deist, DPM;  Location: [facility];  Service: Podiatry;  Laterality: Left;   COLECTOMY     COLONOSCOPY WITH PROPOFOL N/A 09/26/2017   Procedure: COLONOSCOPY WITH PROPOFOL;  Surgeon: Lollie Sails, MD;  Location: [facility] ENDOSCOPY;  Service: Endoscopy;  Laterality: N/A;   ESOPHAGOGASTRODUODENOSCOPY N/A 04/21/2015   Procedure: ESOPHAGOGASTRODUODENOSCOPY (EGD);  Surgeon: Lollie Sails, MD;  Location[facility];  Service: Endoscopy;  Laterality: N/A;   Esophgeal cancer     HAMMER TOE SURGERY Left 04/19/2017   Procedure:  HAMMER TOE CORRECTION-3RD & 4TH;  Surgeon: Samara Deist, DPM;  Location: [facility];  Service: Podiatry;  Laterality: Left;   LOBECTOMY Right    METATARSAL OSTEOTOMY Left 04/19/2017   Procedure: PHALANX OSTEOTOMY-AKIN - LEFT;  Surgeon: Samara Deist, DPM;  Location: [facility];  Service: Podiatry;  Laterality: Left;   PARTIAL HYSTERECTOMY     SINUSOTOMY     HPI:  Pt is an 80 y/o F who presented on 06/03/21 with c/c of syncopal event after falling & hit her head. MRI of the brain ordered and was read as acute cortical/subcortical infarct of the right frontal operculum and right insula, encompassing 2.2 cm.  Additional tiny acute infarcts elsewhere within the right frontal operculum and right insula. Acute trace subdural hematoma overlying the anterior left frontal and temporal lobes-secondary to mechanical fall. PMH: HTN, stage 4 colon CA with lung metastasis, HLD, hx of SVT, hx of paroxysmal a-fib, depression, GERD, hx of esophageal CA   Assessment / Plan / Recommendation Clinical Impression  Pt demonstrates a mild cognitive communication impairment that is c/b deficits in word finding/generative naming, memory (recall, retrieval), complex problem solving/reasoning as well as a mild flaccid dysarthria related to moderate left facial weakness. While her facial weakness didn't appear to impact her oral phase abilities, she didn't sense anterior left spillage of salvia throughout session. Currently, pt's speech is ~ 80% intelligible at the sentence level d/t imprecise articulation and reduced volume. Pt's abilities are further complicated by pt's left homonymous hemianopsia. At this time, I would recommend HHST with 24 hour supervision as pt could not recall any of her medicines and is the primary caregiver for her husband.    SLP Assessment  SLP Recommendation/Assessment: All further Speech Lanaguage Pathology  needs can be addressed in the next venue of care SLP Visit Diagnosis:  Cognitive communication deficit (R41.841);Aphasia (R47.01);Dysarthria and anarthria (R47.1)    Follow Up Recommendations  Home health SLP;24 hour supervision/assistance    Frequency and Duration           SLP Evaluation Cognition  Overall Cognitive Status: Impaired/Different from baseline Arousal/Alertness: Awake/alert Orientation Level: Oriented X4 Attention: Selective Selective Attention: Appears intact Memory: Impaired Memory Impairment: Decreased recall of new information;Decreased short term memory Decreased Short Term Memory: Verbal complex;Functional complex Awareness: Appears intact Problem Solving: Impaired Problem Solving Impairment: Verbal complex Executive Function: Reasoning Reasoning: Impaired Reasoning Impairment: Verbal complex;Functional complex Safety/Judgment: Appears intact       Comprehension  Auditory Comprehension Overall Auditory Comprehension: Appears within functional limits for tasks assessed Visual Recognition/Discrimination Discrimination: Not tested Reading Comprehension Reading Status: Not tested    Expression Expression Primary Mode of Expression: Verbal Verbal Expression Overall Verbal Expression: Appears within functional limits for tasks assessed Written Expression Dominant Hand: Right Written Expression: Not tested   Oral / Motor  Oral Motor/Sensory Function Overall Oral Motor/Sensory Function: Moderate impairment Facial ROM: Reduced left Facial Symmetry: Abnormal symmetry left Facial Strength: Reduced left Facial Sensation: Reduced left Lingual ROM: Reduced left Lingual Symmetry: Within Functional Limits Lingual Strength: Within Functional Limits Lingual Sensation: Within Functional Limits Mandible: Within Functional Limits Motor Speech Overall Motor Speech: Impaired Respiration: Within functional limits Phonation: Normal Resonance: Within functional  limits Articulation: Impaired Level of Impairment:  Sentence Intelligibility: Intelligibility reduced Word: 75-100% accurate Phrase: 75-100% accurate Sentence: 50-74% accurate Motor Planning: Witnin functional limits Motor Speech Errors: Not applicable   GO                    Tallis Soledad 06/04/2021, 3:15 PM

## 2021-06-04 NOTE — Evaluation (Signed)
Physical Therapy Evaluation Patient Details Name: Kelly Krause MRN: [id number] DOB: 10-Jan-1941 Today's Date: 06/04/2021   History of Present Illness  Pt is an 80 y/o F who presented on 06/03/21 with c/c of syncopal event after falling & hit her head. MRI of the brain ordered and was read as acute cortical/subcortical infarct of the right frontal operculum and right insula, encompassing 2.2 cm.  Additional tiny acute infarcts elsewhere within the right frontal operculum and right insula. Acute trace subdural hematoma overlying the anterior left frontal and temporal lobes-secondary to mechanical fall. PMH: HTN, stage 4 colon CA with lung metastasis, HLD, hx of SVT, hx of paroxysmal a-fib, depression, GERD, hx of esophageal CA  Clinical Impression  Pt seen for PT evaluation with step daughter present for session. Pt is able to complete bed mobility & sit<>stand without AD but requires CGA for gait 2/2 LOB when ambulating without UE support. Provided pt with RW & pt with improved balance during gait but pt demonstrates decreased attention to L. Pt denies visual changes but MD note states pt has left homonymous hemianopsia. PT educated pt & family on visual/attention deficits increasing her fall risk & recommended she use RW for increased balance with mobility. Step daughter reports she can provide 24 hr supervision for a few days then pt's grandchildren may be able to assist PRN. Will continue to follow pt acutely to address high level balance & gait with LRAD.    Follow Up Recommendations Home health PT;Supervision/Assistance - 24 hour    Equipment Recommendations  None recommended by PT (pt already has RW)    Recommendations for Other Services       Precautions / Restrictions Precautions Precautions: Fall Precaution Comments: L inattention Restrictions Weight Bearing Restrictions: No      Mobility  Bed Mobility Overal bed mobility: Modified Independent Bed Mobility: Supine to Sit;Sit to  Supine     Supine to sit: Modified independent (Device/Increase time);HOB elevated Sit to supine: Modified independent (Device/Increase time);HOB elevated        Transfers Overall transfer level: Modified independent Equipment used: None Transfers: Sit to/from Stand Sit to Stand: Modified independent (Device/Increase time)            Ambulation/Gait Ambulation/Gait assistance: Min guard;Supervision Gait Distance (Feet): 100 Feet (+ 200 ft) Assistive device: None;Rolling walker (2 wheeled) Gait Pattern/deviations: Step-through pattern;Decreased step length - left;Decreased step length - right;Decreased stride length Gait velocity: decreased but reports current gait speed is baseline   General Gait Details: Pt ambulates 100 ft without AD & CGA with staggering left & min assist to correct 1 LOB, ambulates additional 200 ft with RW with improved balance  Stairs            Wheelchair Mobility    Modified Rankin (Stroke Patients Only)       Balance Overall balance assessment: Needs assistance Sitting-balance support: No upper extremity supported;Feet supported Sitting balance-Leahy Scale: Good     Standing balance support: No upper extremity supported;During functional activity Standing balance-Leahy Scale: Fair                               Pertinent Vitals/Pain Pain Assessment: No/denies pain     Home Living Family/patient expects to be discharged to:: Private residence Living Arrangements: Spouse/significant other Available Help at Discharge: Family;Available 24 hours/day;Available PRN/intermittently (lives with spouse with dementia (but he still drives using GPS), step daughter lives 35 miles away) Type of Home:  House Home Access: Stairs to enter Entrance Stairs-Rails: None Technical brewer of Steps: 1+1 Home Layout: One level Home Equipment: Shower seat - built in;Walker - 2 wheels;Grab bars - tub/shower      Prior Function Level  of Independence: Independent         Comments: No other falls besides this one prior to admission. Does all cooking, cleaning, IADLs for spouse.     Hand Dominance   Dominant Hand: Right    Extremity/Trunk Assessment   Upper Extremity Assessment Upper Extremity Assessment: Overall WFL for tasks assessed      Lower Extremity Assessment Lower Extremity Assessment: Overall WFL for tasks assessed (LLE heel to shin slightly slower than RLE) Denies sensory deficits.    Cervical / Trunk Assessment Cervical / Trunk Assessment: Normal  Communication   Communication: Expressive difficulties (slurred speech, difficulty managing secretions from L side of mouth)  Cognition Arousal/Alertness: Awake/alert Behavior During Therapy: WFL for tasks assessed/performed Overall Cognitive Status: Impaired/Different from baseline Area of Impairment: Problem solving;Orientation;Safety/judgement;Awareness                 Orientation Level: Disoriented to;Time (oriented to month but reports current year is 2012)       Safety/Judgement: Decreased awareness of deficits Awareness: Anticipatory Problem Solving: Slow processing General Comments: decreased attention to L      General Comments General comments (skin integrity, edema, etc.): Pt with decreased L attention. Pt denies any visual changes but MD note states pt has left homonymous hemianopsia    Exercises    Assessment/Plan    PT Assessment Patient needs continued PT services  PT Problem List Decreased strength;Decreased mobility;Decreased safety awareness;Decreased activity tolerance;Decreased balance;Decreased cognition       PT Treatment Interventions DME instruction;Therapeutic exercise;Gait training;Balance training;Stair training;Neuromuscular re-education;Functional mobility training;Cognitive remediation;Therapeutic activities;Patient/family education    PT Goals (Current goals can be found in the Care Plan section)   Acute Rehab PT Goals Patient Stated Goal: go home PT Goal Formulation: With patient/family Time For Goal Achievement: 06/18/21 Potential to Achieve Goals: Good    Frequency 7X/week   Barriers to discharge Decreased caregiver support      Co-evaluation               AM-PAC PT "6 Clicks" Mobility  Outcome Measure Help needed turning from your back to your side while in a flat bed without using bedrails?: None Help needed moving from lying on your back to sitting on the side of a flat bed without using bedrails?: None Help needed moving to and from a bed to a chair (including a wheelchair)?: A Little Help needed standing up from a chair using your arms (e.g., wheelchair or bedside chair)?: None Help needed to walk in hospital room?: A Little Help needed climbing 3-5 steps with a railing? : A Little 6 Click Score: 21    End of Session Equipment Utilized During Treatment: Gait belt Activity Tolerance: Patient tolerated treatment well Patient left: in chair;with family/visitor present;with call bell/phone within reach Nurse Communication: Mobility status PT Visit Diagnosis: Unsteadiness on feet (R26.81);Muscle weakness (generalized) (M62.81);Hemiplegia and hemiparesis Hemiplegia - Right/Left: Left Hemiplegia - dominant/non-dominant: Non-dominant Hemiplegia - caused by: Cerebral infarction    Time: 1478-2956 PT Time Calculation (min) (ACUTE ONLY): 24 min   Charges:   PT Evaluation $PT Eval Moderate Complexity: 1 Mod PT Treatments $Therapeutic Activity: 8-22 mins        Lavone Nian, PT, DPT 06/04/21, 11:11 AM   Waunita Schooner 06/04/2021, 11:09 AM

## 2021-06-04 NOTE — Progress Notes (Signed)
PROGRESS NOTE    Kelly Krause  VWU:[id number] DOB: 05/15/1941 DOA: 06/03/2021 PCP: Sallee Lange, NP   Brief Narrative:  80 y.o. female with medical history significant for hypertension, history of stage IV colon cancer with lung metastasis, hyperlipidemia, history of SVT, history of paroxysmal atrial fibrillation, depression, GERD, history of esophageal carcinoma, presents to the emergency department from home via EMS for chief concerns of syncopal event.   At bedside patient was able to tell me her full name, age, location of hospital, current calendar year.  There is no facial drooping or slurring of speech at bedside.   She reports that she woke up, felt dizzy, ambulated to bathroom, felt dizzy and fell and hit her head.  Telemetry neurology consulted on admission.  Patient's MRI did demonstrate a small acute/subacute cortical infarct with small area of subdural hematoma.  However the patient was given aspirin and Plavix load.  The case was discussed with inpatient neurology consult the following morning.  Antiplatelets were discontinued in the setting of small subdural.    Assessment & Plan:   Principal Problem:   Stroke [facility]) Active Problems:   Clinical depression   Essential (primary) hypertension   Cancer of esophagus (HCC)   Cancer of colon ([place])   SCC (squamous cell carcinoma of esophagus) (HCC)   Atrial fibrillation (HCC)   Cancer of cecum (HCC)   Hypertensive urgency   Sinus bradycardia  Right MCA acute CVA Intracranial atherosclerosis Trace left temporal and frontal lobe subdural hematoma, suspect traumatic Patient had a syncopal event and resultant head trauma Small subdural hematoma noted on MRI imaging in addition to an acute infarction Patient still with some residual slurred speech Inpatient neurology on consult Plan: Continue frequent neurochecks Continue telemetry Follow-up 2D echocardiogram Follow-up hemoglobin A1c Blood pressure control,  SBP less than 180 Discontinue antiplatelets Patient will likely need therapeutic anticoagulation.  Eliquis to begin in 10 to 15 days from last known well Refrain from driving Therapy and speech evaluations  History of atrial fibrillation Patient is rate controlled but not on anticoagulation Has not seen cardiology in years A. fib managed with aspirin by PCP CHA2DS2-VASc of at least 6 Plan: Continue telemetry Home sotalol for rate control Defer anticoagulation for now in the setting of recent SDH however patient will benefit from therapeutic anticoagulation.  Will start 10 to 15 days from last known well  Essential hypertension Restart home sotalol and amlodipine As needed hydralazine Goal SBP less than 180  Depression/anxiety PTA bupropion PTA sertraline  GERD PTA PPI     DVT prophylaxis: SCD Code Status: Full Family Communication: Daughter at bedside 7/15 Disposition Plan: Status is: Inpatient  Remains inpatient appropriate because:Inpatient level of care appropriate due to severity of illness  Dispo: The patient is from: Home              Anticipated d/c is to: Home              Patient currently is not medically stable to d/c.   Difficult to place patient No  Acute CVA.  Stroke work-up in progress.     Level of care: Progressive Cardiac  Consultants:  Neurology  Procedures:  None  Antimicrobials:  None   Subjective: Patient seen and examined.  Still is dysarthric with unilateral weakness.  No pain complaints.  Objective: Vitals:   06/04/21 0358 06/04/21 0726 06/04/21 1000 06/04/21 1143  BP: (!) 153/65 (!) 198/73  (!) 167/70  Pulse: (!) 54 (!) 57 63 (!) 55  Resp: 20 18  17   Temp: 98.1 F (36.7 C) 97.9 F (36.6 C)  97.9 F (36.6 C)  TempSrc: Oral Oral    SpO2: 93% 98%  94%  Weight:      Height:        Intake/Output Summary (Last 24 hours) at 06/04/2021 1159 Last data filed at 06/04/2021 1022 Gross per 24 hour  Intake 240 ml  Output 950  ml  Net -710 ml   Filed Weights   06/03/21 1913  Weight: 86.6 kg    Examination:  General exam: No acute distress Respiratory system: Clear to auscultation. Respiratory effort normal. Cardiovascular system: S1-S2, regular rate, irregular rhythm, no murmurs, no pedal edema  gastrointestinal system: Soft, nontender, nondistended, normal bowel sounds Central nervous system: Mild dysarthria.  Gait not assessed Extremities: Decreased right-sided power Skin: No rashes, lesions or ulcers Psychiatry: Judgement and insight appear normal. Mood & affect appropriate.     Data Reviewed: I have personally reviewed following labs and imaging studies  CBC: Recent Labs  Lab 06/03/21 1412  WBC 7.7  NEUTROABS 4.8  HGB 12.6  HCT 38.2  MCV 97.9  PLT 585   Basic Metabolic Panel: Recent Labs  Lab 06/03/21 1412  NA 138  K 3.8  CL 99  CO2 31  GLUCOSE 107*  BUN 17  CREATININE 0.83  CALCIUM 9.1   GFR: Estimated Creatinine Clearance: 60.1 mL/min (by C-G formula based on SCr of 0.83 mg/dL). Liver Function Tests: Recent Labs  Lab 06/03/21 1412  AST 27  ALT 24  ALKPHOS 69  BILITOT 0.8  PROT 7.6  ALBUMIN 3.7   No results for input(s): LIPASE, AMYLASE in the last 168 hours. No results for input(s): AMMONIA in the last 168 hours. Coagulation Profile: Recent Labs  Lab 06/03/21 1412  INR 1.1   Cardiac Enzymes: No results for input(s): CKTOTAL, CKMB, CKMBINDEX, TROPONINI in the last 168 hours. BNP (last 3 results) No results for input(s): PROBNP in the last 8760 hours. HbA1C: No results for input(s): HGBA1C in the last 72 hours. CBG: No results for input(s): GLUCAP in the last 168 hours. Lipid Profile: Recent Labs    06/04/21 0635  CHOL 179  HDL 59  LDLCALC 103*  TRIG 87  CHOLHDL 3.0   Thyroid Function Tests: No results for input(s): TSH, T4TOTAL, FREET4, T3FREE, THYROIDAB in the last 72 hours. Anemia Panel: No results for input(s): VITAMINB12, FOLATE, FERRITIN,  TIBC, IRON, RETICCTPCT in the last 72 hours. Sepsis Labs: No results for input(s): PROCALCITON, LATICACIDVEN in the last 168 hours.  Recent Results (from the past 240 hour(s))  Resp Panel by RT-PCR (Flu A&B, Covid) Nasopharyngeal Swab     Status: None   Collection Time: 06/03/21  7:07 PM   Specimen: Nasopharyngeal Swab; Nasopharyngeal(NP) swabs in vial transport medium  Result Value Ref Range Status   SARS Coronavirus 2 by RT PCR NEGATIVE NEGATIVE Final    Comment: (NOTE) SARS-CoV-2 target nucleic acids are NOT DETECTED.  The SARS-CoV-2 RNA is generally detectable in upper respiratory specimens during the acute phase of infection. The lowest concentration of SARS-CoV-2 viral copies this assay can detect is 138 copies/mL. A negative result does not preclude SARS-Cov-2 infection and should not be used as the sole basis for treatment or other patient management decisions. A negative result may occur with  improper specimen collection/handling, submission of specimen other than nasopharyngeal swab, presence of viral mutation(s) within the areas targeted by this assay, and inadequate number of viral copies(<138  copies/mL). A negative result must be combined with clinical observations, patient history, and epidemiological information. The expected result is Negative.  Fact Sheet for Patients:  EntrepreneurPulse.com.au  Fact Sheet for Healthcare Providers:  IncredibleEmployment.be  This test is no t yet approved or cleared by the [place] FDA and  has been authorized for detection and/or diagnosis of SARS-CoV-2 by FDA under an Emergency Use Authorization (EUA). This EUA will remain  in effect (meaning this test can be used) for the duration of the COVID-19 declaration under Section 564(b)(1) of the Act, 21 U.S.C.section 360bbb-3(b)(1), unless the authorization is terminated  or revoked sooner.       Influenza A by PCR NEGATIVE NEGATIVE Final    Influenza B by PCR NEGATIVE NEGATIVE Final    Comment: (NOTE) The Xpert Xpress SARS-CoV-2/FLU/RSV plus assay is intended as an aid in the diagnosis of influenza from Nasopharyngeal swab specimens and should not be used as a sole basis for treatment. Nasal washings and aspirates are unacceptable for Xpert Xpress SARS-CoV-2/FLU/RSV testing.  Fact Sheet for Patients: EntrepreneurPulse.com.au  Fact Sheet for Healthcare Providers: IncredibleEmployment.be  This test is not yet approved or cleared by the [place] FDA and has been authorized for detection and/or diagnosis of SARS-CoV-2 by FDA under an Emergency Use Authorization (EUA). This EUA will remain in effect (meaning this test can be used) for the duration of the COVID-19 declaration under Section 564(b)(1) of the Act, 21 U.S.C. section 360bbb-3(b)(1), unless the authorization is terminated or revoked.  Performed at [facility], [address], [place], [place] [address]          Radiology Studies: [facility] HEAD NECK W WO CM  Result Date: 06/03/2021 CLINICAL DATA:  Stroke/TIA. EXAM: CT ANGIOGRAPHY HEAD AND NECK TECHNIQUE: Multidetector CT imaging of the head and neck was performed using the standard protocol during bolus administration of intravenous contrast. Multiplanar CT image reconstructions and MIPs were obtained to evaluate the vascular anatomy. Carotid stenosis measurements (when applicable) are obtained utilizing NASCET criteria, using the distal internal carotid diameter as the denominator. CONTRAST:  36mL OMNIPAQUE IOHEXOL 350 MG/ML SOLN COMPARISON:  None. FINDINGS: CTA NECK FINDINGS Aortic arch: Great vessel origins are patent. Approximately 40% stenosis of the left subclavian artery origin due to atherosclerosis. Right carotid system: Mixed calcific and noncalcific atherosclerosis at the carotid bifurcation without greater than 50% stenosis. Retropharyngeal course of  the ICA. Left carotid system: Atherosclerosis of the common carotid artery and carotid bifurcation without greater than 50% stenosis. Vertebral arteries: Moderate right and severe left vertebral artery origin stenosis due to atherosclerosis. Remainder of the vertebral arteries are patent without significant stenosis. Skeleton: Evaluated on same day CT cervical spine and CT head. Other neck: No acute abnormality. Upper chest: Tree in bud nodular opacities in the lateral left upper lobe (see series 6, image 4). Biapical pleuroparenchymal scarring. Review of the MIP images confirms the above findings CTA HEAD FINDINGS Anterior circulation: Bilateral intracranial ICAs, MCAs, and ACAs are patent. aSevere right proximal A2 ACA stenosis with more distal moderate narrowing. No aneurysm identified. Posterior circulation: Bilateral intradural vertebral arteries, the basilar artery, and the cerebral arteries are patent. Moderate stenosis of the proximal left P2 PCA. Small left P1 PCA with left posterior communicating artery, anatomic variant. No aneurysm identified. Venous sinuses: As permitted by contrast timing, patent. Bilateral transverse sinus arachnoid granulations. Review of the MIP images confirms the above findings IMPRESSION: CTA Head: 1. No large vessel occlusion. 2. Severe right proximal A2 ACA  stenosis with more distal moderate ACA narrowing. 3. Moderate proximal left P2 PCA stenosis. CTA Neck: 1. Moderate right and severe left vertebral artery origin stenosis. Remainder of the vertebral arteries are patent without significant stenosis. 2. Approximately 40% stenosis of the left subclavian artery origin. 3. Bilateral carotid bifurcation atherosclerosis without greater than 50% stenosis. 4. Tree in bud nodular opacities in the lateral left upper lobe, most likely infectious/inflammatory. Recommend dedicated chest imaging. Electronically Signed   By: Margaretha Sheffield MD   On: 06/03/2021 17:55   CT HEAD WO  CONTRAST  Result Date: 06/03/2021 CLINICAL DATA:  Head and neck pain after fall, left frontal injury. Syncope EXAM: CT HEAD WITHOUT CONTRAST CT CERVICAL SPINE WITHOUT CONTRAST TECHNIQUE: Multidetector CT imaging of the head and cervical spine was performed following the standard protocol without intravenous contrast. Multiplanar CT image reconstructions of the cervical spine were also generated. COMPARISON:  01/11/2012, 07/22/2010 FINDINGS: CT HEAD FINDINGS Brain: No evidence of acute infarction, hemorrhage, hydrocephalus, extra-axial collection or mass lesion/mass effect. Moderate low-density changes within the periventricular and subcortical white matter compatible with chronic microvascular ischemic change. Mild diffuse cerebral volume loss. Vascular: No hyperdense vessel or unexpected calcification. Skull: Normal. Negative for fracture or focal lesion. Sinuses/Orbits: Mild mucosal thickening within the right sphenoid sinus. Orbital structures appear unremarkable. Other: Soft tissue swelling with small scalp hematoma overlying the left frontal bone measuring 1.9 x 0.5 x 1.5 cm. CT CERVICAL SPINE FINDINGS Alignment: Facet joints are aligned without dislocation or traumatic listhesis. Dens and lateral masses are aligned. Straightening with slight reversal of the cervical lordosis. Skull base and vertebrae: No acute fracture. No primary bone lesion or focal pathologic process. Bilateral C2-3 facet joints are fused. Soft tissues and spinal canal: No prevertebral fluid or swelling. No visible canal hematoma. Slight medial course of the right internal carotid artery. Disc levels: Mild multilevel disc height loss with endplate spurring. Left greater than right multilevel facet arthropathy, most pronounced at C3-4 and C4-5. Upper chest: Right apical pleuroparenchymal scarring. Other: None. IMPRESSION: 1. No acute intracranial findings. 2. Soft tissue swelling with small scalp hematoma overlying the left frontal bone.  3. No evidence of acute fracture or traumatic listhesis of the cervical spine. 4. Moderate chronic microvascular ischemic changes and cerebral volume loss. 5. Mild multilevel degenerative disc and facet arthropathy of the cervical spine. Electronically Signed   By: Davina Poke D.O.   On: 06/03/2021 15:07   CT CERVICAL SPINE WO CONTRAST  Result Date: 06/03/2021 CLINICAL DATA:  Head and neck pain after fall, left frontal injury. Syncope EXAM: CT HEAD WITHOUT CONTRAST CT CERVICAL SPINE WITHOUT CONTRAST TECHNIQUE: Multidetector CT imaging of the head and cervical spine was performed following the standard protocol without intravenous contrast. Multiplanar CT image reconstructions of the cervical spine were also generated. COMPARISON:  01/11/2012, 07/22/2010 FINDINGS: CT HEAD FINDINGS Brain: No evidence of acute infarction, hemorrhage, hydrocephalus, extra-axial collection or mass lesion/mass effect. Moderate low-density changes within the periventricular and subcortical white matter compatible with chronic microvascular ischemic change. Mild diffuse cerebral volume loss. Vascular: No hyperdense vessel or unexpected calcification. Skull: Normal. Negative for fracture or focal lesion. Sinuses/Orbits: Mild mucosal thickening within the right sphenoid sinus. Orbital structures appear unremarkable. Other: Soft tissue swelling with small scalp hematoma overlying the left frontal bone measuring 1.9 x 0.5 x 1.5 cm. CT CERVICAL SPINE FINDINGS Alignment: Facet joints are aligned without dislocation or traumatic listhesis. Dens and lateral masses are aligned. Straightening with slight reversal of the cervical lordosis. Skull  base and vertebrae: No acute fracture. No primary bone lesion or focal pathologic process. Bilateral C2-3 facet joints are fused. Soft tissues and spinal canal: No prevertebral fluid or swelling. No visible canal hematoma. Slight medial course of the right internal carotid artery. Disc levels: Mild  multilevel disc height loss with endplate spurring. Left greater than right multilevel facet arthropathy, most pronounced at C3-4 and C4-5. Upper chest: Right apical pleuroparenchymal scarring. Other: None. IMPRESSION: 1. No acute intracranial findings. 2. Soft tissue swelling with small scalp hematoma overlying the left frontal bone. 3. No evidence of acute fracture or traumatic listhesis of the cervical spine. 4. Moderate chronic microvascular ischemic changes and cerebral volume loss. 5. Mild multilevel degenerative disc and facet arthropathy of the cervical spine. Electronically Signed   By: Davina Poke D.O.   On: 06/03/2021 15:07   MR BRAIN WO CONTRAST  Result Date: 06/03/2021 CLINICAL DATA:  Neuro deficit, acute, stroke suspected. Additional provided: Loss of consciousness, stroke like symptoms. EXAM: MRI HEAD WITHOUT CONTRAST TECHNIQUE: Multiplanar, multiecho pulse sequences of the brain and surrounding structures were obtained without intravenous contrast. COMPARISON:  CT angiogram head/neck 06/03/2021. Noncontrast head CT performed earlier today 06/03/2021. Brain MRI 08/14/2014. FINDINGS: Brain: Mild generalized cerebral and cerebellar atrophy. Acute cortical/subcortical infarct within the right frontal operculum and right insula encompassing 2.2 cm. Additional tiny acute cortical infarcts elsewhere within the right frontal operculum and right insula. A small white matter infarct is also present within the right precentral gyrus. There is a trace, likely acute, subdural hematoma measuring 1-2 mm in thickness overlying the anterior left frontal lobe and anterior left temporal lobe. Moderate multifocal T2/FLAIR hyperintensity within the cerebral white matter, nonspecific but compatible with chronic small vessel ischemic disease. To a lesser degree, chronic small vessel ischemic changes are also present within the deep gray nuclei and pons. No evidence of an intracranial mass. No extra-axial fluid  collection. No midline shift. Vascular: Expected proximal arterial flow voids. Skull and upper cervical spine: No focal marrow lesion. Sinuses/Orbits: Visualized orbits show no acute finding. Small mucous retention cyst within the right frontal sinus. Trace bilateral ethmoid sinus mucosal thickening. Small volume frothy secretions within the left maxillary sinus. Complete opacification of the right maxillary sinus. Other: Redemonstrated left anterior scalp, forehead and left maxillofacial soft tissue swelling/hematoma. These results were called by telephone at the time of interpretation on 06/03/2021 at 9:01 pm to provider Dr. Charna Archer, who verbally acknowledged these results. IMPRESSION: Acute cortical/subcortical infarct within the right frontal operculum and right insula, encompassing 2.2 cm. Additional tiny acute infarcts elsewhere within the right frontal operculum and right insula. A small white matter infarct is also present within the right precentral gyrus. These infarcts are located within the right MCA vascular territory. Trace acute subdural hematoma overlying the anterior left frontal and temporal lobes, measuring 1-2 mm in thickness. Moderate chronic small vessel ischemic changes within the cerebral white matter, progressed from the brain MRI of 08/14/2014. To a lesser degree, chronic small vessel ischemic changes are also present within the deep gray nuclei and pons. Mild generalized parenchymal atrophy. Paranasal sinus disease, as described. Redemonstrated left anterior scalp, forehead and left maxillofacial soft tissue swelling/hematoma. Electronically Signed   By: Kellie Simmering DO   On: 06/03/2021 21:02   DG Chest Portable 1 View  Result Date: 06/03/2021 CLINICAL DATA:  Recent syncopal episode, initial encounter EXAM: PORTABLE CHEST 1 VIEW COMPARISON:  10/03/2017 CT FINDINGS: Cardiac shadow is within normal limits. Right chest wall port is noted  in satisfactory position. Mild scarring is noted in  the right lung similar to that seen on prior exam. Postsurgical changes are seen in the right base. No acute infiltrate or sizable effusion is noted. IMPRESSION: Chronic scarring in the right lung base. No acute abnormality noted. Electronically Signed   By: Inez Catalina M.D.   On: 06/03/2021 19:36        Scheduled Meds:   stroke: mapping our early stages of recovery book   Does not apply Once   amLODipine  10 mg Oral Daily   buPROPion  150 mg Oral BID   Chlorhexidine Gluconate Cloth  6 each Topical Daily   pantoprazole  40 mg Oral BID   sertraline  100 mg Oral Daily   sotalol  80 mg Oral Q12H   Continuous Infusions:   LOS: 0 days    Time spent: 25 minutes    Sidney Ace, MD [facility] Pager [phone]  If 7PM-7AM, please contact night-coverage 06/04/2021, 11:59 AM

## 2021-06-04 NOTE — Progress Notes (Signed)
*  PRELIMINARY RESULTS* Echocardiogram 2D Echocardiogram has been performed.  Kelly Krause 06/04/2021, 8:47 AM

## 2021-06-04 NOTE — Consult Note (Addendum)
Neurology Consultation  Reason for Consult: Stroke Referring Physician: Dr Priscella Mann CC: Speech difficulty  History is obtained from: Chart, patient, patient's stepdaughter at bedside  HPI: Kelly Krause is a 80 y.o. female atrial fibrillation on anticoagulation, colon cancer, hypertension, hyperlipidemia, history of lung cancer, status postradiation, presenting to the emergency room for evaluation of difficulty with speech and falls. She had a fall yesterday morning and she hit the left side of her head and then family started to notice that she was having some trouble with word finding. She also noticed that her left side-arm and leg were not as strong as her right side.  She also noticed left facial drooping.  Family also noticed later on, after the word finding difficulty resolved that she has slurred speech.  She was evaluated in the emergency room, MRI of the brain was performed that showed acute cortical subcortical infarction within the right frontal operculum and right insula with a 2.2 cm total area and additional tiny acute infarcts elsewhere within the right frontal operculum and right insula.  A small white matter infarct is also present in the right precentral gyrus.  Most of these infarctions are in the right MCA vascular territory.  There is trace acute subdural hematoma overlying the anterior left frontal and temporal lobes measuring 1 to 2 mm in thickness. Moderate chronic small vessel ischemic changes with the cerebral white matter which have progressed from 2015.  Neurological consultation was obtained for the stroke.  LKW: Sometime the night of 06/02/2021 tpa given?: no, outside the window Premorbid modified Rankin scale (mRS): 1  ROS: Full ROS was performed and is negative except as noted in the HPI.   Past Medical History:  Diagnosis Date   Allergy    Atrial fibrillation ([place])    Cancer ([place])    esophagus/chemo and rad   Colon cancer ([place])    chemo/rad 15 yrs    Colon cancer ([place])    2 yrs ago   DDD (degenerative disc disease), cervical    Dysrhythmia    Elevated lipids    GERD (gastroesophageal reflux disease)    Hyperlipidemia    Hypertension    Lung cancer ([facility])    chemo   Lung cancer ([facility])    Major depressive disorder, recurrent episode, moderate ([facility])    Persistent disorder of initiating or maintaining sleep    Personal history of chemotherapy    Personal history of radiation therapy    colon   SVT (supraventricular tachycardia) ([place])     Family History  Problem Relation Age of Onset   Stroke Mother    Depression Mother    Breast cancer Mother 42   Breast cancer Maternal Aunt 22   Alcohol abuse Father    Stroke Father    Hypertension Father    Social History:   reports that she has quit smoking. She has never used smokeless tobacco. She reports current alcohol use. She reports that she does not use drugs.  Lives at home, with husband who has mild Alzheimer's dementia but still drives and takes care of ADLs.  Medications  Current Facility-Administered Medications:     stroke: mapping our early stages of recovery book, , Does not apply, Once, Cox, Amy N, DO   acetaminophen (TYLENOL) tablet 650 mg, 650 mg, Oral, Q4H PRN, 650 mg at 06/04/21 0813 **OR** acetaminophen (TYLENOL) 160 MG/5ML solution 650 mg, 650 mg, Per Tube, Q4H PRN **OR** acetaminophen (TYLENOL) suppository 650 mg, 650 mg, Rectal, Q4H PRN, Cox, Amy  N, DO   aspirin EC tablet 81 mg, 81 mg, Oral, Daily, Cox, Amy N, DO, 81 mg at 06/04/21 0814   buPROPion (WELLBUTRIN SR) 12 hr tablet 150 mg, 150 mg, Oral, BID, Cox, Amy N, DO, 150 mg at 06/04/21 2355   Chlorhexidine Gluconate Cloth 2 % PADS 6 each, 6 each, Topical, Daily, Priscella Mann, Sudheer B, MD, 6 each at 06/04/21 0815   hydrALAZINE (APRESOLINE) injection 5 mg, 5 mg, Intravenous, Q4H PRN, Cox, Amy N, DO   pantoprazole (PROTONIX) EC tablet 40 mg, 40 mg, Oral, BID, Cox, Amy N, DO, 40 mg at 06/04/21 7322   sertraline (ZOLOFT)  tablet 100 mg, 100 mg, Oral, Daily, Cox, Amy N, DO, 100 mg at 06/04/21 0254  Facility-Administered Medications Ordered in Other Encounters:    0.9 %  sodium chloride infusion, , Intravenous, Continuous, Herring, Orville Govern, NP, Last Rate: 999 mL/hr at 07/20/15 1146, New Bag at 07/20/15 1146   heparin lock flush 100 unit/mL, 500 Units, Intravenous, Once, Choksi, Janak, MD   heparin lock flush 100 unit/mL, 500 Units, Intravenous, Once, Corcoran, Melissa C, MD   sodium chloride 0.9 % injection 10 mL, 10 mL, Intravenous, PRN, Choksi, Janak, MD, 10 mL at 07/20/15 1147   sodium chloride flush (NS) 0.9 % injection 10 mL, 10 mL, Intravenous, PRN, Mike Gip, Melissa C, MD, 10 mL at 03/22/17 1235   Exam: Current vital signs: BP (!) 198/73 (BP Location: Left Arm)   Pulse (!) 57   Temp 97.9 F (36.6 C)   Resp 18   Ht 5\' 7"  (1.702 m)   Wt 86.6 kg   SpO2 98%   BMI 29.90 kg/m  Vital signs in last 24 hours: Temp:  [97.7 F (36.5 C)-98.1 F (36.7 C)] 97.9 F (36.6 C) (07/15 0726) Pulse Rate:  [51-62] 57 (07/15 0726) Resp:  [15-21] 18 (07/15 0726) BP: (145-212)/(58-93) 198/73 (07/15 0726) SpO2:  [93 %-100 %] 98 % (07/15 0726) Weight:  [86.6 kg] 86.6 kg (07/14 1913) General: Awake alert in no distress HEENT: Normocephalic, left forehead and eye bruising Lungs: Clear Cardiovascular: Regular rate rhythm Abdomen soft nondistended nontender Extremities warm well perfused Neurological exam Awake alert oriented x3 Mild dysarthria No gross aphasia Cranial nerves: Pupils equal round react light, extraocular movements intact, visual field examination shows a left superior quadrantanopsia-difficult exam given her cooperation, subtle left nasolabial fold flattening, tongue and palate midline. Motor examination with no drift in any of the 4 extremities Sensation intact to touch without extinction, but she extinguishes visually on the left and double simultaneous stimulation Coordination with no  dysmetria Gait testing deferred at this time NIH stroke scale 1a Level of Conscious.: 0 1b LOC Questions: 0 1c LOC Commands: 0 2 Best Gaze: 0 3 Visual: 1 4 Facial Palsy: 1 5a Motor Arm - left: 0 5b Motor Arm - Right: 0 6a Motor Leg - Left: 0 6b Motor Leg - Right: 0 7 Limb Ataxia: 0 8 Sensory: 0 9 Best Language: 0 10 Dysarthria: 1 11 Extinct. and Inatten.: 0 TOTAL: 3  Labs I have reviewed labs in epic and the results pertinent to this consultation are:   CBC    Component Value Date/Time   WBC 7.7 06/03/2021 1412   RBC 3.90 06/03/2021 1412   HGB 12.6 06/03/2021 1412   HGB 13.0 02/18/2015 1038   HCT 38.2 06/03/2021 1412   HCT 38.3 02/18/2015 1038   PLT 226 06/03/2021 1412   PLT 231 02/18/2015 1038   MCV 97.9 06/03/2021  1412   MCV 96 02/18/2015 1038   MCH 32.3 06/03/2021 1412   MCHC 33.0 06/03/2021 1412   RDW 13.4 06/03/2021 1412   RDW 13.1 02/18/2015 1038   LYMPHSABS 1.9 06/03/2021 1412   LYMPHSABS 1.4 02/18/2015 1038   MONOABS 0.8 06/03/2021 1412   MONOABS 0.4 02/18/2015 1038   EOSABS 0.2 06/03/2021 1412   EOSABS 0.4 02/18/2015 1038   BASOSABS 0.1 06/03/2021 1412   BASOSABS 0.1 02/18/2015 1038    CMP     Component Value Date/Time   NA 138 06/03/2021 1412   NA 142 10/13/2015 1102   NA 137 02/18/2015 1038   K 3.8 06/03/2021 1412   K 3.8 02/18/2015 1038   CL 99 06/03/2021 1412   CL 101 02/18/2015 1038   CO2 31 06/03/2021 1412   CO2 31 02/18/2015 1038   GLUCOSE 107 (H) 06/03/2021 1412   GLUCOSE 91 02/18/2015 1038   BUN 17 06/03/2021 1412   BUN 15 10/13/2015 1102   BUN 19 02/18/2015 1038   CREATININE 0.83 06/03/2021 1412   CREATININE 0.95 02/18/2015 1038   CALCIUM 9.1 06/03/2021 1412   CALCIUM 9.0 02/18/2015 1038   PROT 7.6 06/03/2021 1412   PROT 6.9 03/18/2016 1455   PROT 7.0 02/18/2015 1038   ALBUMIN 3.7 06/03/2021 1412   ALBUMIN 4.3 03/18/2016 1455   ALBUMIN 4.1 02/18/2015 1038   AST 27 06/03/2021 1412   AST 23 02/18/2015 1038   ALT 24  06/03/2021 1412   ALT 18 02/18/2015 1038   ALKPHOS 69 06/03/2021 1412   ALKPHOS 70 02/18/2015 1038   BILITOT 0.8 06/03/2021 1412   BILITOT 0.4 03/18/2016 1455   BILITOT 0.5 02/18/2015 1038   GFRNONAA >60 06/03/2021 1412   GFRNONAA 59 (L) 02/18/2015 1038   GFRAA >60 10/03/2017 0917   GFRAA >60 02/18/2015 1038    Lipid Panel     Component Value Date/Time   CHOL 179 06/04/2021 0635   CHOL 195 03/18/2016 1455   CHOL 171 01/12/2012 0139   TRIG 87 06/04/2021 0635   TRIG 127 01/12/2012 0139   HDL 59 06/04/2021 0635   HDL 74 03/18/2016 1455   HDL 63 (H) 01/12/2012 0139   CHOLHDL 3.0 06/04/2021 0635   VLDL 17 06/04/2021 0635   VLDL 25 01/12/2012 0139   LDLCALC 103 (H) 06/04/2021 0635   LDLCALC 97 03/18/2016 1455   LDLCALC 83 01/12/2012 0139   LDL 103 A1c pending  Imaging I have reviewed the images obtained: CT-scan of the brain IMPRESSION: 1. No acute intracranial findings. 2. Soft tissue swelling with small scalp hematoma overlying the left frontal bone. 3. No evidence of acute fracture or traumatic listhesis of the cervical spine. 4. Moderate chronic microvascular ischemic changes and cerebral volume loss. 5. Mild multilevel degenerative disc and facet arthropathy of the cervical spine.  CTA head and neck IMPRESSION: CTA Head:  1. No large vessel occlusion. 2. Severe right proximal A2 ACA stenosis with more distal moderate ACA narrowing. 3. Moderate proximal left P2 PCA stenosis. CTA Neck: 1. Moderate right and severe left vertebral artery origin stenosis. Remainder of the vertebral arteries are patent without significant stenosis. 2. Approximately 40% stenosis of the left subclavian artery origin. 3. Bilateral carotid bifurcation atherosclerosis without greater than 50% stenosis. 4. Tree in bud nodular opacities in the lateral left upper lobe, most likely infectious/inflammatory. Recommend dedicated chest imaging.  MRI brain IMPRESSION: Acute  cortical/subcortical infarct within the right frontal operculum and right insula, encompassing 2.2 cm. Additional  tiny acute infarcts elsewhere within the right frontal operculum and right insula. A small white matter infarct is also present within the right precentral gyrus. These infarcts are located within the right MCA vascular territory.   Trace acute subdural hematoma overlying the anterior left frontal and temporal lobes, measuring 1-2 mm in thickness.   Moderate chronic small vessel ischemic changes within the cerebral white matter, progressed from the brain MRI of 08/14/2014. To a lesser degree, chronic small vessel ischemic changes are also present within the deep gray nuclei and pons.   Mild generalized parenchymal atrophy.   Paranasal sinus disease, as described.   Redemonstrated left anterior scalp, forehead and left maxillofacial soft tissue swelling/hematoma.  Assessment: 80 year old with above past medical history including that of atrial fibrillation not on anticoagulation, presenting after a fall followed by word finding difficulty slurred speech and some left-sided weakness.  Imaging reveals acute cortical and subcortical infarction within the right frontal operculum and right insula on the MRI.  MRI additionally reveals a trace acute subdural hematoma overlying the anterior left frontal and temporal lobes measuring 1 to 2 mm in thickness.  Moderate chronic small vessel ischemic changes.  Her head and neck vessel imaging did not reveal any emergent large vessel occlusion.  Shows severe right proximal A2 stenosis with more distal severe narrowing, moderate proximal left P2 stenosis, moderate right and severe left vertebral artery origin stenosis and approximately 40% stenosis of the left subclavian artery origin.  Bilateral carotid less than 50% stenosis at bifurcation.  Stroke on the MRI is not attributable to the stenoses.   At this time, I think the origin of her  stroke is cardioembolic given her atrial fibrillation and not being on anticoagulation but due to the recent trace subdural hematoma, I would hold off on resuming anticoagulation right away.  The trace subdural hematoma is likely traumatic from the fall.  Impression: -Right MCA territory acute ischemic infarction-likely cardioembolic in etiology -Intracranial atherosclerotic disease-less likely the cause of the stroke -Trace subdural hematoma over the left temporal lobes and frontal lobes-likely traumatic -Left-sided facial arm and leg weakness as well as dysarthria likely due to the right MCA stroke but the reported aphasia which has now resolved was likely secondary to the traumatic subdural hematoma over the left hemisphere  Recommendations: -Frequent checks -Telemetry -2D echo -A1c -Given the subdural hematoma, I would keep her blood pressures less than 180 at all times. -She was given a load with aspirin and Plavix which I have discontinued given the findings of SDH on MRI. -For stroke prevention, she will eventually need to be on anticoagulation given she has atrial fibrillation-but that is going to have to wait for a little bit given the trace subdural hematoma. -I would recommend starting anticoagulation-with Eliquis in about 10 to 15 days from last known well, which will ensure that the traumatic subdural hematoma has resolved and the stroke also will be at the lowest risk of hemorrhagic transformation by that time. I do not know if there is any merit in repeat imaging because the CT scan that we did did not capture the subdural hematoma and it was only captured on the MRI which is more sensitive. -If she has recurrent episodes of aphasia, might consider doing an EEG to evaluate for any underlying electrographic abnormality under the subdural -She still drives - I would recommend she refrain from driving as she does have left homonymous hemianopsia. -PT OT ST.  I will follow-up the  remainder of the studies.  Plan relayed to Dr. Priscella Mann   -- Amie Portland, MD Neurologist [facility] Pager: [phone]

## 2021-06-05 MED ORDER — POLYETHYLENE GLYCOL 3350 17 G PO PACK
17.0000 g | PACK | Freq: Every day | ORAL | Status: DC | PRN
  Administered 2021-06-06: 17 g via ORAL
  Filled 2021-06-05: qty 1

## 2021-06-05 MED ORDER — SENNOSIDES-DOCUSATE SODIUM 8.6-50 MG PO TABS
1.0000 | ORAL_TABLET | Freq: Two times a day (BID) | ORAL | Status: DC
  Administered 2021-06-05 – 2021-06-06 (×3): 1 via ORAL
  Filled 2021-06-05 (×3): qty 1

## 2021-06-05 MED ORDER — RAMIPRIL 10 MG PO CAPS
10.0000 mg | ORAL_CAPSULE | Freq: Every day | ORAL | Status: DC
  Administered 2021-06-05 – 2021-06-06 (×2): 10 mg via ORAL
  Filled 2021-06-05 (×2): qty 1

## 2021-06-05 NOTE — Progress Notes (Signed)
PROGRESS NOTE    Kelly Krause  EXB:[id number] DOB: 1941/10/11 DOA: 06/03/2021 PCP: Sallee Lange, NP   Brief Narrative:  80 y.o. female with medical history significant for hypertension, history of stage IV colon cancer with lung metastasis, hyperlipidemia, history of SVT, history of paroxysmal atrial fibrillation, depression, GERD, history of esophageal carcinoma, presents to the emergency department from home via EMS for chief concerns of syncopal event.   At bedside patient was able to tell me her full name, age, location of hospital, current calendar year.  There is no facial drooping or slurring of speech at bedside.   She reports that she woke up, felt dizzy, ambulated to bathroom, felt dizzy and fell and hit her head.  Telemetry neurology consulted on admission.  Patient's MRI did demonstrate a small acute/subacute cortical infarct with small area of subdural hematoma.  However the patient was given aspirin and Plavix load.  The case was discussed with inpatient neurology consult the following morning.  Antiplatelets were discontinued in the setting of small subdural.  Repeat head CT showed stability of bleed.  Case discussed at length with neurology.  Will recommend aspirin 81 mg monotherapy for now.  Within the course of about 2 weeks after discharge patient will require therapeutic anticoagulation.    Assessment & Plan:   Principal Problem:   Stroke [facility]) Active Problems:   Clinical depression   Essential (primary) hypertension   Cancer of esophagus (HCC)   Cancer of colon ([place])   SCC (squamous cell carcinoma of esophagus) (HCC)   Atrial fibrillation (HCC)   Cancer of cecum (HCC)   Hypertensive urgency   Sinus bradycardia   Acute CVA (cerebrovascular accident) ([place])  Right MCA acute CVA Intracranial atherosclerosis Trace left temporal and frontal lobe subdural hematoma, suspect traumatic Patient had a syncopal event and resultant head trauma Small subdural  hematoma noted on MRI imaging in addition to an acute infarction Patient still with some residual slurred speech Inpatient neurology on consult 2D echo normal ejection fraction, grade 2 diastolic dysfunction Hemoglobin A1c 6.7 LDL 103 Plan: Continue frequent neurochecks Continue telemetry Blood pressure control, SBP less than 180 Aspirin 81 mg daily Patient will need therapeutic anticoagulation.  Will schedule Eliquis to start on discharge August 1 Home health for discharge planning Anticipated date of discharge 06/06/2021  History of atrial fibrillation Patient is rate controlled but not on anticoagulation Has not seen cardiology in years A. fib managed with aspirin by PCP CHA2DS2-VASc of at least 6 Plan: Continue telemetry Home sotalol for rate control Defer anticoagulation for now in the setting of recent SDH however patient will benefit from therapeutic anticoagulation.  Will start 10 to 15 days from last known well  Essential hypertension Continue home sotalol Continue home amlodipine Resume home ramipril As needed hydralazine Goal SBP less than 180  Depression/anxiety PTA bupropion PTA sertraline  GERD PTA PPI     DVT prophylaxis: SCD Code Status: Full Family Communication: Daughter at bedside 7/16 Disposition Plan: Status is: Inpatient  Remains inpatient appropriate because:Inpatient level of care appropriate due to severity of illness  Dispo: The patient is from: Home              Anticipated d/c is to: Home              Patient currently is not medically stable to d/c.   Difficult to place patient No  Stroke work-up nearly complete.  Need therapy evaluations, confirmation of recovery of neurologic function and blood pressure control.  Anticipated date of discharge 06/06/2021.     Level of care: Progressive Cardiac  Consultants:  Neurology  Procedures:  None  Antimicrobials:  None   Subjective: Patient seen and examined.  Sitting up in  chair.  No visible distress.  Dysarthria improving.  No pain complaints  Objective: Vitals:   06/04/21 2049 06/05/21 0440 06/05/21 0720 06/05/21 1227  BP:  (!) 163/65 (!) 164/74 (!) 177/55  Pulse: 62 63 (!) 57 (!) 51  Resp:  19 16 18   Temp:  98.3 F (36.8 C) 98.6 F (37 C) 97.6 F (36.4 C)  TempSrc:  Oral Oral Oral  SpO2:  93% 96% 96%  Weight:      Height:        Intake/Output Summary (Last 24 hours) at 06/05/2021 1412 Last data filed at 06/05/2021 1330 Gross per 24 hour  Intake 720 ml  Output 775 ml  Net -55 ml   Filed Weights   06/03/21 1913  Weight: 86.6 kg    Examination:  General exam: No acute distress Respiratory system: Clear to auscultation. Respiratory effort normal. Cardiovascular system: S1-S2, regular rate and rhythm, no murmurs gastrointestinal system: Soft, nontender, nondistended, normal bowel sounds Central nervous system: Mild dysarthria.  Gait not assessed Extremities: Decreased right-sided power Skin: No rashes, lesions or ulcers Psychiatry: Judgement and insight appear normal. Mood & affect appropriate.     Data Reviewed: I have personally reviewed following labs and imaging studies  CBC: Recent Labs  Lab 06/03/21 1412  WBC 7.7  NEUTROABS 4.8  HGB 12.6  HCT 38.2  MCV 97.9  PLT 130   Basic Metabolic Panel: Recent Labs  Lab 06/03/21 1412  NA 138  K 3.8  CL 99  CO2 31  GLUCOSE 107*  BUN 17  CREATININE 0.83  CALCIUM 9.1   GFR: Estimated Creatinine Clearance: 60.1 mL/min (by C-G formula based on SCr of 0.83 mg/dL). Liver Function Tests: Recent Labs  Lab 06/03/21 1412  AST 27  ALT 24  ALKPHOS 69  BILITOT 0.8  PROT 7.6  ALBUMIN 3.7   No results for input(s): LIPASE, AMYLASE in the last 168 hours. No results for input(s): AMMONIA in the last 168 hours. Coagulation Profile: Recent Labs  Lab 06/03/21 1412  INR 1.1   Cardiac Enzymes: No results for input(s): CKTOTAL, CKMB, CKMBINDEX, TROPONINI in the last 168  hours. BNP (last 3 results) No results for input(s): PROBNP in the last 8760 hours. HbA1C: Recent Labs    06/04/21 0635  HGBA1C 6.7*   CBG: No results for input(s): GLUCAP in the last 168 hours. Lipid Profile: Recent Labs    06/04/21 0635  CHOL 179  HDL 59  LDLCALC 103*  TRIG 87  CHOLHDL 3.0   Thyroid Function Tests: No results for input(s): TSH, T4TOTAL, FREET4, T3FREE, THYROIDAB in the last 72 hours. Anemia Panel: No results for input(s): VITAMINB12, FOLATE, FERRITIN, TIBC, IRON, RETICCTPCT in the last 72 hours. Sepsis Labs: No results for input(s): PROCALCITON, LATICACIDVEN in the last 168 hours.  Recent Results (from the past 240 hour(s))  Resp Panel by RT-PCR (Flu A&B, Covid) Nasopharyngeal Swab     Status: None   Collection Time: 06/03/21  7:07 PM   Specimen: Nasopharyngeal Swab; Nasopharyngeal(NP) swabs in vial transport medium  Result Value Ref Range Status   SARS Coronavirus 2 by RT PCR NEGATIVE NEGATIVE Final    Comment: (NOTE) SARS-CoV-2 target nucleic acids are NOT DETECTED.  The SARS-CoV-2 RNA is generally detectable in upper respiratory specimens  during the acute phase of infection. The lowest concentration of SARS-CoV-2 viral copies this assay can detect is 138 copies/mL. A negative result does not preclude SARS-Cov-2 infection and should not be used as the sole basis for treatment or other patient management decisions. A negative result may occur with  improper specimen collection/handling, submission of specimen other than nasopharyngeal swab, presence of viral mutation(s) within the areas targeted by this assay, and inadequate number of viral copies(<138 copies/mL). A negative result must be combined with clinical observations, patient history, and epidemiological information. The expected result is Negative.  Fact Sheet for Patients:  EntrepreneurPulse.com.au  Fact Sheet for Healthcare Providers:   IncredibleEmployment.be  This test is no t yet approved or cleared by the [place] FDA and  has been authorized for detection and/or diagnosis of SARS-CoV-2 by FDA under an Emergency Use Authorization (EUA). This EUA will remain  in effect (meaning this test can be used) for the duration of the COVID-19 declaration under Section 564(b)(1) of the Act, 21 U.S.C.section 360bbb-3(b)(1), unless the authorization is terminated  or revoked sooner.       Influenza A by PCR NEGATIVE NEGATIVE Final   Influenza B by PCR NEGATIVE NEGATIVE Final    Comment: (NOTE) The Xpert Xpress SARS-CoV-2/FLU/RSV plus assay is intended as an aid in the diagnosis of influenza from Nasopharyngeal swab specimens and should not be used as a sole basis for treatment. Nasal washings and aspirates are unacceptable for Xpert Xpress SARS-CoV-2/FLU/RSV testing.  Fact Sheet for Patients: EntrepreneurPulse.com.au  Fact Sheet for Healthcare Providers: IncredibleEmployment.be  This test is not yet approved or cleared by the [place] FDA and has been authorized for detection and/or diagnosis of SARS-CoV-2 by FDA under an Emergency Use Authorization (EUA). This EUA will remain in effect (meaning this test can be used) for the duration of the COVID-19 declaration under Section 564(b)(1) of the Act, 21 U.S.C. section 360bbb-3(b)(1), unless the authorization is terminated or revoked.  Performed at [facility], [address] [place], [place] [address]          Radiology Studies: CT [facility] HEAD NECK W WO CM  Result Date: 06/03/2021 CLINICAL DATA:  Stroke/TIA. EXAM: CT ANGIOGRAPHY HEAD AND NECK TECHNIQUE: Multidetector CT imaging of the head and neck was performed using the standard protocol during bolus administration of intravenous contrast. Multiplanar CT image reconstructions and MIPs were obtained to evaluate the vascular anatomy.  Carotid stenosis measurements (when applicable) are obtained utilizing NASCET criteria, using the distal internal carotid diameter as the denominator. CONTRAST:  33mL OMNIPAQUE IOHEXOL 350 MG/ML SOLN COMPARISON:  None. FINDINGS: CTA NECK FINDINGS Aortic arch: Great vessel origins are patent. Approximately 40% stenosis of the left subclavian artery origin due to atherosclerosis. Right carotid system: Mixed calcific and noncalcific atherosclerosis at the carotid bifurcation without greater than 50% stenosis. Retropharyngeal course of the ICA. Left carotid system: Atherosclerosis of the common carotid artery and carotid bifurcation without greater than 50% stenosis. Vertebral arteries: Moderate right and severe left vertebral artery origin stenosis due to atherosclerosis. Remainder of the vertebral arteries are patent without significant stenosis. Skeleton: Evaluated on same day CT cervical spine and CT head. Other neck: No acute abnormality. Upper chest: Tree in bud nodular opacities in the lateral left upper lobe (see series 6, image 4). Biapical pleuroparenchymal scarring. Review of the MIP images confirms the above findings CTA HEAD FINDINGS Anterior circulation: Bilateral intracranial ICAs, MCAs, and ACAs are patent. aSevere right proximal A2 ACA stenosis with more distal  moderate narrowing. No aneurysm identified. Posterior circulation: Bilateral intradural vertebral arteries, the basilar artery, and the cerebral arteries are patent. Moderate stenosis of the proximal left P2 PCA. Small left P1 PCA with left posterior communicating artery, anatomic variant. No aneurysm identified. Venous sinuses: As permitted by contrast timing, patent. Bilateral transverse sinus arachnoid granulations. Review of the MIP images confirms the above findings IMPRESSION: CTA Head: 1. No large vessel occlusion. 2. Severe right proximal A2 ACA stenosis with more distal moderate ACA narrowing. 3. Moderate proximal left P2 PCA stenosis.  CTA Neck: 1. Moderate right and severe left vertebral artery origin stenosis. Remainder of the vertebral arteries are patent without significant stenosis. 2. Approximately 40% stenosis of the left subclavian artery origin. 3. Bilateral carotid bifurcation atherosclerosis without greater than 50% stenosis. 4. Tree in bud nodular opacities in the lateral left upper lobe, most likely infectious/inflammatory. Recommend dedicated chest imaging. Electronically Signed   By: Margaretha Sheffield MD   On: 06/03/2021 17:55   CT HEAD WO CONTRAST  Result Date: 06/04/2021 CLINICAL DATA:  Stroke and subdural hematoma on MRI, follow-up EXAM: CT HEAD WITHOUT CONTRAST TECHNIQUE: Contiguous axial images were obtained from the base of the skull through the vertex without intravenous contrast. COMPARISON:  Recent CT imaging, correlation made with MRI 05/04/2021 FINDINGS: Brain: No acute intracranial hemorrhage or mass effect. Hypoattenuation and loss of gray-white differentiation are identified along the right insula and adjacent frontal lobe similar to prior CT. No new loss of gray-white differentiation. The trace left frontotemporal convexity subdural hematoma on MRI is not definitely identified on CT. Stable findings of chronic microvascular ischemic changes. Ventricles are stable in size. Vascular: No new finding. Skull: Calvarium is unremarkable. Sinuses/Orbits: Chronic right maxillary sinusitis. No acute abnormality. Other: Persistent left frontal scalp soft tissue swelling and hematoma. IMPRESSION: Evolving acute right MCA territory infarction as seen on prior CT and MRI. No acute intracranial hemorrhage. Trace left subdural hematoma on MRI is not definitely seen by CT. Electronically Signed   By: Macy Mis M.D.   On: 06/04/2021 13:31   CT HEAD WO CONTRAST  Result Date: 06/03/2021 CLINICAL DATA:  Head and neck pain after fall, left frontal injury. Syncope EXAM: CT HEAD WITHOUT CONTRAST CT CERVICAL SPINE WITHOUT  CONTRAST TECHNIQUE: Multidetector CT imaging of the head and cervical spine was performed following the standard protocol without intravenous contrast. Multiplanar CT image reconstructions of the cervical spine were also generated. COMPARISON:  01/11/2012, 07/22/2010 FINDINGS: CT HEAD FINDINGS Brain: No evidence of acute infarction, hemorrhage, hydrocephalus, extra-axial collection or mass lesion/mass effect. Moderate low-density changes within the periventricular and subcortical white matter compatible with chronic microvascular ischemic change. Mild diffuse cerebral volume loss. Vascular: No hyperdense vessel or unexpected calcification. Skull: Normal. Negative for fracture or focal lesion. Sinuses/Orbits: Mild mucosal thickening within the right sphenoid sinus. Orbital structures appear unremarkable. Other: Soft tissue swelling with small scalp hematoma overlying the left frontal bone measuring 1.9 x 0.5 x 1.5 cm. CT CERVICAL SPINE FINDINGS Alignment: Facet joints are aligned without dislocation or traumatic listhesis. Dens and lateral masses are aligned. Straightening with slight reversal of the cervical lordosis. Skull base and vertebrae: No acute fracture. No primary bone lesion or focal pathologic process. Bilateral C2-3 facet joints are fused. Soft tissues and spinal canal: No prevertebral fluid or swelling. No visible canal hematoma. Slight medial course of the right internal carotid artery. Disc levels: Mild multilevel disc height loss with endplate spurring. Left greater than right multilevel facet arthropathy, most pronounced at  C3-4 and C4-5. Upper chest: Right apical pleuroparenchymal scarring. Other: None. IMPRESSION: 1. No acute intracranial findings. 2. Soft tissue swelling with small scalp hematoma overlying the left frontal bone. 3. No evidence of acute fracture or traumatic listhesis of the cervical spine. 4. Moderate chronic microvascular ischemic changes and cerebral volume loss. 5. Mild  multilevel degenerative disc and facet arthropathy of the cervical spine. Electronically Signed   By: Davina Poke D.O.   On: 06/03/2021 15:07   CT CERVICAL SPINE WO CONTRAST  Result Date: 06/03/2021 CLINICAL DATA:  Head and neck pain after fall, left frontal injury. Syncope EXAM: CT HEAD WITHOUT CONTRAST CT CERVICAL SPINE WITHOUT CONTRAST TECHNIQUE: Multidetector CT imaging of the head and cervical spine was performed following the standard protocol without intravenous contrast. Multiplanar CT image reconstructions of the cervical spine were also generated. COMPARISON:  01/11/2012, 07/22/2010 FINDINGS: CT HEAD FINDINGS Brain: No evidence of acute infarction, hemorrhage, hydrocephalus, extra-axial collection or mass lesion/mass effect. Moderate low-density changes within the periventricular and subcortical white matter compatible with chronic microvascular ischemic change. Mild diffuse cerebral volume loss. Vascular: No hyperdense vessel or unexpected calcification. Skull: Normal. Negative for fracture or focal lesion. Sinuses/Orbits: Mild mucosal thickening within the right sphenoid sinus. Orbital structures appear unremarkable. Other: Soft tissue swelling with small scalp hematoma overlying the left frontal bone measuring 1.9 x 0.5 x 1.5 cm. CT CERVICAL SPINE FINDINGS Alignment: Facet joints are aligned without dislocation or traumatic listhesis. Dens and lateral masses are aligned. Straightening with slight reversal of the cervical lordosis. Skull base and vertebrae: No acute fracture. No primary bone lesion or focal pathologic process. Bilateral C2-3 facet joints are fused. Soft tissues and spinal canal: No prevertebral fluid or swelling. No visible canal hematoma. Slight medial course of the right internal carotid artery. Disc levels: Mild multilevel disc height loss with endplate spurring. Left greater than right multilevel facet arthropathy, most pronounced at C3-4 and C4-5. Upper chest: Right apical  pleuroparenchymal scarring. Other: None. IMPRESSION: 1. No acute intracranial findings. 2. Soft tissue swelling with small scalp hematoma overlying the left frontal bone. 3. No evidence of acute fracture or traumatic listhesis of the cervical spine. 4. Moderate chronic microvascular ischemic changes and cerebral volume loss. 5. Mild multilevel degenerative disc and facet arthropathy of the cervical spine. Electronically Signed   By: Davina Poke D.O.   On: 06/03/2021 15:07   MR BRAIN WO CONTRAST  Result Date: 06/03/2021 CLINICAL DATA:  Neuro deficit, acute, stroke suspected. Additional provided: Loss of consciousness, stroke like symptoms. EXAM: MRI HEAD WITHOUT CONTRAST TECHNIQUE: Multiplanar, multiecho pulse sequences of the brain and surrounding structures were obtained without intravenous contrast. COMPARISON:  CT angiogram head/neck 06/03/2021. Noncontrast head CT performed earlier today 06/03/2021. Brain MRI 08/14/2014. FINDINGS: Brain: Mild generalized cerebral and cerebellar atrophy. Acute cortical/subcortical infarct within the right frontal operculum and right insula encompassing 2.2 cm. Additional tiny acute cortical infarcts elsewhere within the right frontal operculum and right insula. A small white matter infarct is also present within the right precentral gyrus. There is a trace, likely acute, subdural hematoma measuring 1-2 mm in thickness overlying the anterior left frontal lobe and anterior left temporal lobe. Moderate multifocal T2/FLAIR hyperintensity within the cerebral white matter, nonspecific but compatible with chronic small vessel ischemic disease. To a lesser degree, chronic small vessel ischemic changes are also present within the deep gray nuclei and pons. No evidence of an intracranial mass. No extra-axial fluid collection. No midline shift. Vascular: Expected proximal arterial flow voids. Skull  and upper cervical spine: No focal marrow lesion. Sinuses/Orbits: Visualized orbits  show no acute finding. Small mucous retention cyst within the right frontal sinus. Trace bilateral ethmoid sinus mucosal thickening. Small volume frothy secretions within the left maxillary sinus. Complete opacification of the right maxillary sinus. Other: Redemonstrated left anterior scalp, forehead and left maxillofacial soft tissue swelling/hematoma. These results were called by telephone at the time of interpretation on 06/03/2021 at 9:01 pm to provider Dr. Charna Archer, who verbally acknowledged these results. IMPRESSION: Acute cortical/subcortical infarct within the right frontal operculum and right insula, encompassing 2.2 cm. Additional tiny acute infarcts elsewhere within the right frontal operculum and right insula. A small white matter infarct is also present within the right precentral gyrus. These infarcts are located within the right MCA vascular territory. Trace acute subdural hematoma overlying the anterior left frontal and temporal lobes, measuring 1-2 mm in thickness. Moderate chronic small vessel ischemic changes within the cerebral white matter, progressed from the brain MRI of 08/14/2014. To a lesser degree, chronic small vessel ischemic changes are also present within the deep gray nuclei and pons. Mild generalized parenchymal atrophy. Paranasal sinus disease, as described. Redemonstrated left anterior scalp, forehead and left maxillofacial soft tissue swelling/hematoma. Electronically Signed   By: Kellie Simmering DO   On: 06/03/2021 21:02   DG Chest Portable 1 View  Result Date: 06/03/2021 CLINICAL DATA:  Recent syncopal episode, initial encounter EXAM: PORTABLE CHEST 1 VIEW COMPARISON:  10/03/2017 CT FINDINGS: Cardiac shadow is within normal limits. Right chest wall port is noted in satisfactory position. Mild scarring is noted in the right lung similar to that seen on prior exam. Postsurgical changes are seen in the right base. No acute infiltrate or sizable effusion is noted. IMPRESSION: Chronic  scarring in the right lung base. No acute abnormality noted. Electronically Signed   By: Inez Catalina M.D.   On: 06/03/2021 19:36   ECHOCARDIOGRAM LIMITED BUBBLE STUDY  Result Date: 06/04/2021    ECHOCARDIOGRAM LIMITED REPORT   Patient Name:   Kelly Krause Date of Exam: 06/04/2021 Medical Rec #:  [id number]       Height:       67.0 in Accession #:    [id number]      Weight:       190.9 lb Date of Birth:  November 07, 1941        BSA:          1.983 m Patient Age:    8 years        BP:           198/73 mmHg Patient Gender: F               HR:           66 bpm. Exam Location:  [facility] Procedure: Limited Echo, Limited Color Doppler, Cardiac Doppler and Saline            Contrast Bubble Study Indications:     I63.9 Stroke  History:         Patient has no prior history of Echocardiogram examinations.                  Arrythmias:Atrial Fibrillation; Risk Factors:Hypertension and                  Dyslipidemia. History of radiation/chemotherapy; History of                  heart ablation.  Sonographer:     Charmayne Sheer RDCS (  AE) Referring Phys:  [id number] AMY N [facility] Diagnosing Phys: Ida Rogue MD IMPRESSIONS  1. Left ventricular ejection fraction, by estimation, is 60 to 65%. The left ventricle has normal function. The left ventricle has no regional wall motion abnormalities. Left ventricular diastolic parameters are consistent with Grade II diastolic dysfunction (pseudonormalization).  2. Right ventricular systolic function is normal. The right ventricular size is normal.  3. The mitral valve is normal in structure. Mild mitral valve regurgitation. No evidence of mitral stenosis.  4. Tricuspid valve regurgitation is moderate.  5. Agitated saline contrast bubble study was negative, with no evidence of any interatrial shunt. FINDINGS  Left Ventricle: Left ventricular ejection fraction, by estimation, is 60 to 65%. The left ventricle has normal function. The left ventricle has no regional wall motion abnormalities. The left  ventricular internal cavity size was normal in size. There is  no left ventricular hypertrophy. Left ventricular diastolic parameters are consistent with Grade II diastolic dysfunction (pseudonormalization). Right Ventricle: The right ventricular size is normal. No increase in right ventricular wall thickness. Right ventricular systolic function is normal. Left Atrium: Left atrial size was normal in size. Right Atrium: Right atrial size was normal in size. Pericardium: There is no evidence of pericardial effusion. Mitral Valve: The mitral valve is normal in structure. Mild mitral valve regurgitation. No evidence of mitral valve stenosis. MV peak gradient, 3.9 mmHg. The mean mitral valve gradient is 1.0 mmHg. Tricuspid Valve: The tricuspid valve is normal in structure. Tricuspid valve regurgitation is moderate . No evidence of tricuspid stenosis. Aortic Valve: The aortic valve is normal in structure. Aortic valve regurgitation is not visualized. Mild to moderate aortic valve sclerosis/calcification is present, without any evidence of aortic stenosis. Pulmonic Valve: The pulmonic valve was normal in structure. Pulmonic valve regurgitation is not visualized. No evidence of pulmonic stenosis. Aorta: The aortic root is normal in size and structure. Venous: The inferior vena cava is normal in size with greater than 50% respiratory variability, suggesting right atrial pressure of 3 mmHg. IAS/Shunts: No atrial level shunt detected by color flow Doppler. Agitated saline contrast was given intravenously to evaluate for intracardiac shunting. Agitated saline contrast bubble study was negative, with no evidence of any interatrial shunt. LEFT VENTRICLE PLAX 2D LVIDd:         4.80 cm LVIDs:         3.30 cm LV PW:         0.90 cm LV IVS:        0.90 cm  LEFT ATRIUM         Index LA diam:    4.50 cm 2.27 cm/m  PULMONIC VALVE PV Vmax:       0.92 m/s PV Vmean:      67.200 cm/s PV VTI:        0.225 m PV Peak grad:  3.4 mmHg PV Mean  grad:  2.0 mmHg  MITRAL VALVE MV Area (PHT): 3.93 cm MV Peak grad:  3.9 mmHg MV Mean grad:  1.0 mmHg MV Vmax:       0.99 m/s MV Vmean:      57.0 cm/s MV Decel Time: 193 msec MV E velocity: 88.70 cm/s MV A velocity: 69.40 cm/s MV E/A ratio:  1.28 Ida Rogue MD Electronically signed by Ida Rogue MD Signature Date/Time: 06/04/2021/4:41:43 PM    Final         Scheduled Meds:   stroke: mapping our early stages of recovery book   Does not apply Once  amLODipine  10 mg Oral Daily   buPROPion  150 mg Oral BID   Chlorhexidine Gluconate Cloth  6 each Topical Daily   pantoprazole  40 mg Oral BID   senna-docusate  1 tablet Oral BID   sertraline  100 mg Oral Daily   sotalol  80 mg Oral Q12H   Continuous Infusions:   LOS: 1 day    Time spent: 25 minutes    Sidney Ace, MD [facility] Pager [phone]  If 7PM-7AM, please contact night-coverage 06/05/2021, 2:12 PM

## 2021-06-05 NOTE — TOC Initial Note (Signed)
Transition of Care [facility]) - Initial/Assessment Note    Patient Details  Name: Kelly Krause MRN: [id number] Date of Birth: 06-Feb-1941  Transition of Care [facility]) CM/SW Contact:    Boris Sharper, LCSW Phone Number: 06/05/2021, 1:43 PM  Clinical Narrative:                 [facility] consult for [name]. Pt is from home where she lives with her spouse.  CSW contacted pt and pt agreed to [facility] services, pt did not have a preference in agencies. CSW spoke with Stacie and arranged [place] PT and OT with [facility]. Pt has a walker, no other DME needs at this time. TOC will continue to follow for other discharge needs.    Expected Discharge Plan: [place] Barriers to Discharge: Continued Medical Work up   Patient Goals and CMS Choice   CMS Medicare.gov Compare Post Acute Care list provided to:: Patient Choice offered to / list presented to : Patient  Expected Discharge Plan and Services Expected Discharge Plan: [place] Choice: [place] arrangements for the past 2 months: Single Family Home                           HH Arranged: PT, OT HH Agency: [place] Date [place]: 06/05/21 Time [place]: 1341 Representative spoke with at [place]: Freddi Starr  Prior Living Arrangements/Services Living arrangements for the past 2 months: [place] Lives with:: Spouse Patient language and need for interpreter reviewed:: Yes Do you feel safe going back to the place where you live?: Yes      Need for Family Participation in Patient Care: Yes (Comment) Care giver support system in place?: Yes (comment) (family members)   Criminal Activity/Legal Involvement Pertinent to Current Situation/Hospitalization: No - Comment as needed  Activities of Daily Living Home Assistive Devices/Equipment: None ADL Screening (condition at time of admission) Patient's cognitive ability adequate to safely  complete daily activities?: Yes Is the patient deaf or have difficulty hearing?: Yes Does the patient have difficulty seeing, even when wearing glasses/contacts?: No Does the patient have difficulty concentrating, remembering, or making decisions?: No Patient able to express need for assistance with ADLs?: Yes Does the patient have difficulty dressing or bathing?: No Independently performs ADLs?: Yes (appropriate for developmental age) Does the patient have difficulty walking or climbing stairs?: No Weakness of Legs: None Weakness of Arms/Hands: None  Permission Sought/Granted Permission sought to share information with : Facility Art therapist granted to share information with : Yes, Verbal Permission Granted  Share Information with NAME: [name]     Permission granted to share info w Relationship: spouse  Permission granted to share info w Contact Information: [phone]  Emotional Assessment Appearance:: Other (Comment Required (unable to assess) Attitude/Demeanor/Rapport: Unable to Assess Affect (typically observed): Unable to Assess Orientation: : Oriented to Self, Oriented to Place, Oriented to  Time, Oriented to Situation Alcohol / Substance Use: Not Applicable Psych Involvement: No (comment)  Admission diagnosis:  Slurred speech [R47.81] Stroke [facility]) [I63.9] Facial droop due to acute stroke ([place]) [I63.9, R29.810] Acute CVA (cerebrovascular accident) [facility]) [I63.9] Patient Active Problem List   Diagnosis Date Noted   Acute CVA (cerebrovascular accident) ([place]) 06/04/2021   Stroke ([place]) 06/03/2021   Hypertensive urgency 06/03/2021   Sinus bradycardia 06/03/2021   Cancer of cecum ([place]) 10/04/2017  Normocytic anemia 10/04/2017   A-fib ([place]) 06/16/2015   Cancer of colon ([place]) 06/16/2015   DD (diverticular disease) 06/16/2015   Esophageal ulcer 06/16/2015   Hallux abducto valgus 06/16/2015   Hammer toe 06/16/2015   H/O varicella 06/16/2015   HK  (hyperkeratosis) 06/16/2015   BP (high blood pressure) 06/16/2015   Neuritis or radiculitis due to rupture of lumbar intervertebral disc 06/16/2015   Cancer of lung ([place]) 06/16/2015   Degenerative arthritis of spine 06/16/2015   SCC (squamous cell carcinoma of esophagus) (HCC) 06/16/2015   Supraventricular tachycardia ([place]) 06/16/2015   Temporary cerebral vascular dysfunction 06/16/2015   Familial multiple lipoprotein-type hyperlipidemia 03/27/2015   Clinical depression 03/27/2015   Acid reflux 03/27/2015   CN (constipation) 03/27/2015   Ulcerative cystitis 03/27/2015   Routine general medical examination at a health care facility 03/27/2015   Allergic rhinitis 03/27/2015   H/O malignant neoplasm 03/27/2015   Contusion of knee and lower leg 03/27/2015   Narrowing of intervertebral disc space 03/27/2015   DDD (degenerative disc disease), cervical 03/27/2015   DDD (degenerative disc disease), lumbar 03/27/2015   H/O: depression 03/27/2015   Can't get food down 03/27/2015   Essential (primary) hypertension 03/27/2015   Cephalalgia 03/27/2015   Discitis of lumbar region 03/27/2015   Blood in the urine 03/27/2015   H/O: HTN (hypertension) 03/27/2015   Menopause 03/27/2015   Screening for depression 03/27/2015   Sinus infection 03/27/2015   FOM (frequency of micturition) 03/27/2015   Arrhythmia, ventricular 03/27/2015   Encounter for mammogram to establish baseline mammogram 03/27/2015   Atrial tachycardia ([place]) 03/27/2015   Lumbar and sacral osteoarthritis 11/03/2014   Atrial fibrillation ([place]) 09/18/2014   Cancer of esophagus ([place]) 06/25/2014   Malignant neoplasm of esophagus ([place]) 06/25/2014   PCP:  Sallee Lange, NP Pharmacy:   [facility] [address] [place] [address] Phone: [phone] Fax: [phone]  [place]  [address] Phone: [phone] Fax: [phone]     Social Determinants of Health (SDOH) Interventions    Readmission Risk Interventions No flowsheet data found.

## 2021-06-05 NOTE — Progress Notes (Signed)
PT Cancellation Note  Patient Details Name: Kelly Krause MRN: [id number] DOB: 21-Aug-1941   Cancelled Treatment:     PT attempt. Pt currently working with OT. Will return later this date and continue to follow per POC.    Willette Pa 06/05/2021, 11:56 AM

## 2021-06-05 NOTE — Progress Notes (Signed)
Neurology Progress Note   S:// Patient seen and examined.  Reports some improvement in her weakness overnight.   O:// Current vital signs: BP (!) 164/74 (BP Location: Left Arm)   Pulse (!) 57   Temp 98.6 F (37 C) (Oral)   Resp 16   Ht 5\' 7"  (1.702 m)   Wt 86.6 kg   SpO2 96%   BMI 29.90 kg/m  Vital signs in last 24 hours: Temp:  [97.7 F (36.5 C)-98.6 F (37 C)] 98.6 F (37 C) (07/16 0720) Pulse Rate:  [55-63] 57 (07/16 0720) Resp:  [16-19] 16 (07/16 0720) BP: (157-176)/(65-74) 164/74 (07/16 0720) SpO2:  [93 %-99 %] 96 % (07/16 0720) General: Awake alert in no distress HEENT: Normocephalic, left forehead and eye bruising Lungs: Clear Cardiovascular: Regular rate rhythm Abdomen soft nondistended nontender Extremities warm and well-perfused Neurologic exam reveals a patient who is awake alert oriented x3.  She has very mild dysarthria.  No gross aphasia. Cranial nerve examination reveals equal pupils round reactive to light, intact extraocular movements, visual fields with a left superior partial quadrantanopsia, subtle left nasolabial fold flattening, tongue and palate midline. Motor examination with no drift in any of the 4 extremities Sensation intact to touch but does report on double simultaneous simulation that the left side sensation is not as strong as the right-sided sensation. Coordination with no dysmetria Gait testing deferred  Medications  Current Facility-Administered Medications:     stroke: mapping our early stages of recovery book, , Does not apply, Once, Cox, Amy N, DO   acetaminophen (TYLENOL) tablet 650 mg, 650 mg, Oral, Q4H PRN, 650 mg at 06/05/21 0912 **OR** acetaminophen (TYLENOL) 160 MG/5ML solution 650 mg, 650 mg, Per Tube, Q4H PRN **OR** acetaminophen (TYLENOL) suppository 650 mg, 650 mg, Rectal, Q4H PRN, Cox, Amy N, DO   amLODipine (NORVASC) tablet 10 mg, 10 mg, Oral, Daily, Sreenath, Sudheer B, MD, 10 mg at 06/05/21 0914   buPROPion (WELLBUTRIN  SR) 12 hr tablet 150 mg, 150 mg, Oral, BID, Cox, Amy N, DO, 150 mg at 06/05/21 0913   Chlorhexidine Gluconate Cloth 2 % PADS 6 each, 6 each, Topical, Daily, Priscella Mann, Sudheer B, MD, 6 each at 06/05/21 0916   hydrALAZINE (APRESOLINE) injection 10 mg, 10 mg, Intravenous, Q4H PRN, Priscella Mann, Sudheer B, MD, 10 mg at 06/04/21 1706   pantoprazole (PROTONIX) EC tablet 40 mg, 40 mg, Oral, BID, Cox, Amy N, DO, 40 mg at 06/05/21 0915   polyethylene glycol (MIRALAX / GLYCOLAX) packet 17 g, 17 g, Oral, Daily PRN, Sreenath, Sudheer B, MD   senna-docusate (Senokot-S) tablet 1 tablet, 1 tablet, Oral, BID, Sreenath, Sudheer B, MD   sertraline (ZOLOFT) tablet 100 mg, 100 mg, Oral, Daily, Cox, Amy N, DO, 100 mg at 06/05/21 0915   sotalol (BETAPACE) tablet 80 mg, 80 mg, Oral, Q12H, Sreenath, Sudheer B, MD, 80 mg at 06/05/21 0915  Facility-Administered Medications Ordered in Other Encounters:    0.9 %  sodium chloride infusion, , Intravenous, Continuous, Herring, Orville Govern, NP, Last Rate: 999 mL/hr at 07/20/15 1146, New Bag at 07/20/15 1146   heparin lock flush 100 unit/mL, 500 Units, Intravenous, Once, Choksi, Janak, MD   heparin lock flush 100 unit/mL, 500 Units, Intravenous, Once, Corcoran, Melissa C, MD   sodium chloride 0.9 % injection 10 mL, 10 mL, Intravenous, PRN, Choksi, Janak, MD, 10 mL at 07/20/15 1147   sodium chloride flush (NS) 0.9 % injection 10 mL, 10 mL, Intravenous, PRN, Corcoran, Melissa C, MD, 10 mL  at 03/22/17 1235 Labs CBC    Component Value Date/Time   WBC 7.7 06/03/2021 1412   RBC 3.90 06/03/2021 1412   HGB 12.6 06/03/2021 1412   HGB 13.0 02/18/2015 1038   HCT 38.2 06/03/2021 1412   HCT 38.3 02/18/2015 1038   PLT 226 06/03/2021 1412   PLT 231 02/18/2015 1038   MCV 97.9 06/03/2021 1412   MCV 96 02/18/2015 1038   MCH 32.3 06/03/2021 1412   MCHC 33.0 06/03/2021 1412   RDW 13.4 06/03/2021 1412   RDW 13.1 02/18/2015 1038   LYMPHSABS 1.9 06/03/2021 1412   LYMPHSABS 1.4 02/18/2015 1038    MONOABS 0.8 06/03/2021 1412   MONOABS 0.4 02/18/2015 1038   EOSABS 0.2 06/03/2021 1412   EOSABS 0.4 02/18/2015 1038   BASOSABS 0.1 06/03/2021 1412   BASOSABS 0.1 02/18/2015 1038    CMP     Component Value Date/Time   NA 138 06/03/2021 1412   NA 142 10/13/2015 1102   NA 137 02/18/2015 1038   K 3.8 06/03/2021 1412   K 3.8 02/18/2015 1038   CL 99 06/03/2021 1412   CL 101 02/18/2015 1038   CO2 31 06/03/2021 1412   CO2 31 02/18/2015 1038   GLUCOSE 107 (H) 06/03/2021 1412   GLUCOSE 91 02/18/2015 1038   BUN 17 06/03/2021 1412   BUN 15 10/13/2015 1102   BUN 19 02/18/2015 1038   CREATININE 0.83 06/03/2021 1412   CREATININE 0.95 02/18/2015 1038   CALCIUM 9.1 06/03/2021 1412   CALCIUM 9.0 02/18/2015 1038   PROT 7.6 06/03/2021 1412   PROT 6.9 03/18/2016 1455   PROT 7.0 02/18/2015 1038   ALBUMIN 3.7 06/03/2021 1412   ALBUMIN 4.3 03/18/2016 1455   ALBUMIN 4.1 02/18/2015 1038   AST 27 06/03/2021 1412   AST 23 02/18/2015 1038   ALT 24 06/03/2021 1412   ALT 18 02/18/2015 1038   ALKPHOS 69 06/03/2021 1412   ALKPHOS 70 02/18/2015 1038   BILITOT 0.8 06/03/2021 1412   BILITOT 0.4 03/18/2016 1455   BILITOT 0.5 02/18/2015 1038   GFRNONAA >60 06/03/2021 1412   GFRNONAA 59 (L) 02/18/2015 1038   GFRAA >60 10/03/2017 0917   GFRAA >60 02/18/2015 1038    glycosylated hemoglobin  Lipid Panel     Component Value Date/Time   CHOL 179 06/04/2021 0635   CHOL 195 03/18/2016 1455   CHOL 171 01/12/2012 0139   TRIG 87 06/04/2021 0635   TRIG 127 01/12/2012 0139   HDL 59 06/04/2021 0635   HDL 74 03/18/2016 1455   HDL 63 (H) 01/12/2012 0139   CHOLHDL 3.0 06/04/2021 0635   VLDL 17 06/04/2021 0635   VLDL 25 01/12/2012 0139   LDLCALC 103 (H) 06/04/2021 0635   LDLCALC 97 03/18/2016 1455   LDLCALC 83 01/12/2012 0139     Imaging I have reviewed images in epic and the results pertinent to this consultation are: Repeat head CT-trace traumatic subdural hematoma visualized on MRI not well  visualized on the CT head.   Stroke work-up - 2D echo-EF 60 to 65%, no regional wall motion abnormalities, grade 2 diastolic dysfunction, normal aortic and mitral valve.  No evidence of interatrial shunt on bubble study. - LDL 103 - A1c 6.7 (goal less than 7)  Assessment: 80 year old with a past medical history of paroxysmal atrial fibrillation not on anticoagulation presented to the hospital after a fall, slurred speech and some word finding difficulty along with left-sided weakness.  Imaging reveals acute cortical and subcortical infarction  within the right frontal operculum and right insula on the MRI along with trace acute subdural hematoma overlying the left anterior frontal and temporal lobes which is likely traumatic from the fall. There is moderate chronic small vessel ischemic changes on her brain MRI as well. Head and neck vessel imaging did not reveal any emergent large vessel occlusion but it showed severe right proximal A2 stenosis with more distal severe narrowing and moderate proximal left P2 stenosis along with moderate right and severe left vertebral artery origin stenosis with 40% stenosis of the left subclavian artery at the origin.  Bilateral carotid stenosis less than 50% of the bifurcation. Etiology of her stroke-likely cardioembolic in the setting of atrial fibrillation. She will need anticoagulation in the long-term for stroke prevention. Given her traumatic subdural, which is very small and trace and only visible on the MRI, I would be very cautious on starting anticoagulation very soon. For now, aspirin 81 should be fine.  Eliquis can be started a little after 2 weeks which would be around June 21, 2021.  Impression: Acute ischemic stroke-likely cardioembolic versus atheroembolic Atrial fibrillation Traumatic subdural hemorrhage  Recommendations: -From the stroke perspective, her work-up has been completed. She needs high intensity statin-atorvastatin 40 mg  daily. -For now aspirin 81 should be fine. -We will wait for initiating anticoagulation due to the trace subdural hematoma-on or around August 1, which puts her a little after 2 weeks from her fall and subdural, she can start Eliquis 5 mg twice daily and discontinue the aspirin. -I would recommend she follow-up with her cardiologist and discuss the need or necessity for an antiplatelet in addition to the anticoagulation-with the caveat that it would always be at very high risk for bleeding in her case.  But if the cardiology team feels that she needs an antiplatelet from a cardiac perspective, that can be considered. -She needs to follow-up with outpatient neurology in 4 to 6 weeks.  I would also recommend a CT scan at that time to ensure that there is no expansion of the subdural hematoma on the CT scan. -If there are any new symptoms or worsening of the symptoms, she should immediately come back to the ER and should be emergently scanned with a CT scan of the head. -PT OT assessments have been completed-likely can go home with home health and 24-hour supervision by family member. -I have also recommended that the patient have some sort of a wearable pushbutton alert device to get emergency care in case her or her husband who has Alzheimer's get into a situation where they are unable to make a phone call to call for help.  Discussed my plan with the patient and the stepdaughter at bedside. Discussed my plan in detail with Dr. Wendee Copp on the unit in person. Inpatient neurology will be available as needed.  Please call with questions.  -- Amie Portland, MD Neurologist [facility] Pager: [phone]

## 2021-06-05 NOTE — Progress Notes (Signed)
Occupational Therapy Treatment Patient Details Name: Kelly Krause MRN: [id number] DOB: Jun 10, 1941 Today's Date: 06/05/2021    History of present illness Pt is an 80 y/o F who presented on 06/03/21 with c/c of syncopal event after falling & hit her head. MRI of the brain ordered and was read as acute cortical/subcortical infarct of the right frontal operculum and right insula, encompassing 2.2 cm.  Additional tiny acute infarcts elsewhere within the right frontal operculum and right insula. Acute trace subdural hematoma overlying the anterior left frontal and temporal lobes-secondary to mechanical fall. PMH: HTN, stage 4 colon CA with lung metastasis, HLD, hx of SVT, hx of paroxysmal a-fib, depression, GERD, hx of esophageal CA   OT comments  Pt seen for OT tx this date to f/u re: safety with ADLs/ADL mobility.  PT asking to shower this date and agreeable to session. MD and RN okay'ed. OT engaged pt in STS with with RW and fxl mobility to restroom with cues to avoid hazards and scan the room. Pt noted to bump into door jam to restroom despite verbal and tactile cues from OT. Pt requires cues to use grab bars and safely transfer to shower chair. She is able to perform UB bathing and LB bathing in sitting with SETUP and standing LB bathing in standing with CGA and use of bed rails. Pt requires SETUP for seated drying and CGA in standing with use of grab bar and transfers her hands to RW to step out of shower with CGA. Pt requires RW, more hands on cueing, and CGA to perform fxl mobility safely back to her chair d/t L neglect. Pt does well with static standing tasks, but is noted to be somewhat impulsive with more dynamic tasks such as attempting to thread underwear in standing, and is noted to lose balance requiring OT assistance to prevent fall. Continue to recommend [facility] f/u but with the caveat that, if family is unable to provide 24/ support and assistance, will potentially have to re-visit d/c  recommendations to ensure safety.    Follow Up Recommendations  Home health OT;Supervision/Assistance - 24 hour    Equipment Recommendations  Tub/shower seat;Other (comment) (grab bars in restroom including shower)    Recommendations for Other Services      Precautions / Restrictions Precautions Precautions: Fall Precaution Comments: L inattention Restrictions Weight Bearing Restrictions: No       Mobility Bed Mobility Overal bed mobility: Modified Independent       Supine to sit: HOB elevated     General bed mobility comments: pt to chair pre/post    Transfers Overall transfer level: Needs assistance Equipment used: Rolling walker (2 wheeled);None Transfers: Sit to/from Stand Sit to Stand: Min guard         General transfer comment: CGA for safety today due to impulsivity + L neglect/visual deficits    Balance Overall balance assessment: Needs assistance Sitting-balance support: No upper extremity supported;Feet supported Sitting balance-Leahy Scale: Good     Standing balance support: No upper extremity supported;During functional activity Standing balance-Leahy Scale: Poor Standing balance comment: high fall risk in standing without BUE support, F balance with UE support for static as well as fxl mobility               High Level Balance Comments: pt is a high fall risk. Therapist had pt perform dynamic gait drills with several occasions of LOB with intervention to prevent falling.           ADL either performed  or assessed with clinical judgement   ADL Overall ADL's : Needs assistance/impaired     Grooming: Wash/dry face;Sitting;Cueing for sequencing Grooming Details (indicate cue type and reason): cues to remove glasses Upper Body Bathing: Set up;Sitting   Lower Body Bathing: Supervison/ safety;Sit to/from stand Lower Body Bathing Details (indicate cue type and reason): wiht use of grab bar in shower and shower chair and cues for safety  including use of grab bar and not use of towel rack to stabilize. Upper Body Dressing : Independent;Sitting   Lower Body Dressing: Min guard;Sit to/from stand;Cueing for safety Lower Body Dressing Details (indicate cue type and reason): able to thread underwear in sitting and stand to perform clothing mgt over hips with RW for balance. Somewhat impulsive and attempts to don underwear in standing, requirng cue to sit to thread feet for safety.             Functional mobility during ADLs: Supervision/safety;Rolling walker (cues for safe use of RW as well as cues for visual scanning to avoid hitting obstacles on her L side 2/2 her L visual inattention.)       Vision Baseline Vision/History: Wears glasses Wears Glasses: At all times Eye Alignment: Within Functional Limits Ocular Range of Motion: Within Functional Limits Additional Comments: L neglect noted with fxl mobility and standing ADLs. Requires cues to attend to L side and avoid obstacles.   Perception     Praxis      Cognition Arousal/Alertness: Awake/alert Behavior During Therapy: WFL for tasks assessed/performed;Impulsive Overall Cognitive Status: Impaired/Different from baseline Area of Impairment: Safety/judgement;Awareness;Problem solving                 Orientation Level: Time       Safety/Judgement: Decreased awareness of safety;Decreased awareness of deficits   Problem Solving: Slow processing General Comments: decreased attention to L, poor safety awareness with slight impulsivity        Exercises Other Exercises Other Exercises: Ed re: fall prevention with ADLs including use of shower chair, use of grab bars, sitting to thread underwear and socks instead of standing. Pt with good reception, but poor carryover and demos some impulsiviy.   Shoulder Instructions       General Comments Pt with L inattention, walked into door jam with fxl mobility to restroom on L side.    Pertinent Vitals/ Pain        Pain Assessment: No/denies pain Pain Score: 0-No pain  Home Living                                          Prior Functioning/Environment              Frequency  Min 2X/week        Progress Toward Goals  OT Goals(current goals can now be found in the care plan section)  Progress towards OT goals: Progressing toward goals  Acute Rehab OT Goals Patient Stated Goal: go home OT Goal Formulation: With patient/family Time For Goal Achievement: 06/18/21 Potential to Achieve Goals: Good  Plan Discharge plan remains appropriate    Co-evaluation                 AM-PAC OT "6 Clicks" Daily Activity     Outcome Measure   Help from another person eating meals?: A Little Help from another person taking care of personal grooming?: A Little Help from another  person toileting, which includes using toliet, bedpan, or urinal?: A Little Help from another person bathing (including washing, rinsing, drying)?: A Little Help from another person to put on and taking off regular upper body clothing?: A Little Help from another person to put on and taking off regular lower body clothing?: A Little 6 Click Score: 18    End of Session Equipment Utilized During Treatment: Gait belt;Rolling walker  OT Visit Diagnosis: Hemiplegia and hemiparesis Hemiplegia - Right/Left: Left Hemiplegia - dominant/non-dominant: Non-Dominant Hemiplegia - caused by: Cerebral infarction   Activity Tolerance Patient tolerated treatment well   Patient Left in chair;with call bell/phone within reach;with chair alarm set;with family/visitor present   Nurse Communication          Time: 5072-2575 OT Time Calculation (min): 53 min  Charges: OT General Charges $OT Visit: 1 Visit OT Treatments $Self Care/Home Management : 23-37 mins $Therapeutic Activity: 23-37 mins  Gerrianne Scale, MS, OTR/L ascom [phone] 06/05/21, 5:22 PM

## 2021-06-05 NOTE — Progress Notes (Signed)
Physical Therapy Treatment Patient Details Name: Kelly Krause MRN: [id number] DOB: 06/02/1941 Today's Date: 06/05/2021    History of Present Illness Pt is an 80 y/o F who presented on 06/03/21 with c/c of syncopal event after falling & hit her head. MRI of the brain ordered and was read as acute cortical/subcortical infarct of the right frontal operculum and right insula, encompassing 2.2 cm.  Additional tiny acute infarcts elsewhere within the right frontal operculum and right insula. Acute trace subdural hematoma overlying the anterior left frontal and temporal lobes-secondary to mechanical fall. PMH: HTN, stage 4 colon CA with lung metastasis, HLD, hx of SVT, hx of paroxysmal a-fib, depression, GERD, hx of esophageal CA    PT Comments    Pt was sitting in recliner upon arriving with spouse present. She is eager for session and quickly attempts to stand without AD or someone close to her. Pt is slightly impulsive. Lengthy discussion about safety concerns and importance of using RW until balance improves. Session focussed on improving dynamic balance, gait, and L visual field improvements. She has several occasions of LOB with intervention to prevent falling. Issued gait belt for home use. Pt and family were educated on importance of the next two- three months for recovery/return in function. Pt will benefit from as much PT going forward as possible. Pt does not want to go to SNF for rehab. Acute PT will continue to follow and progress as able per POC. She was in bed at conclusion of session with call bell in reach and spouse at bedside.    Follow Up Recommendations  Home health PT;Supervision/Assistance - 24 hour     Equipment Recommendations  None recommended by PT;Other (comment) (gait belt issued for home use)       Precautions / Restrictions Precautions Precautions: Fall Precaution Comments: L inattention Restrictions Weight Bearing Restrictions: No    Mobility  Bed  Mobility Overal bed mobility: Modified Independent      General bed mobility comments: pt was easily able to sit > supine EOB without vcs or assistance    Transfers Overall transfer level: Needs assistance Equipment used: Rolling walker (2 wheeled);None Transfers: Sit to/from Stand Sit to Stand: Min guard      General transfer comment: CGA for safety today due to impulsivity + L neglect/visual deficits  Ambulation/Gait Ambulation/Gait assistance: Min assist;Supervision Gait Distance (Feet): 250 Feet Assistive device: 1 person hand held assist;None;Rolling walker (2 wheeled) Gait Pattern/deviations: Step-through pattern;Decreased step length - left;Decreased step length - right;Decreased stride length Gait velocity: needs vcs to slow down and focus   General Gait Details: Pt ambulated 50 ft with close supervision. Vcs for attention on LUE/L side. highly recommend use of RW at all times at DC. pt then proceeded to ambulate 200 ft with LUE HHA and without assistive device with min assist. pt has 3 occasions of LOB L even with min assist.     Balance Overall balance assessment: Needs assistance Sitting-balance support: No upper extremity supported;Feet supported Sitting balance-Leahy Scale: Good     Standing balance support: No upper extremity supported;During functional activity Standing balance-Leahy Scale: Poor Standing balance comment: high fall risk in standing without BUE support.    High Level Balance Comments: pt is a high fall risk. Therapist had pt perform dynamic gait drills with several occasions of LOB with intervention to prevent falling.      Cognition Arousal/Alertness: Awake/alert Behavior During Therapy: WFL for tasks assessed/performed Overall Cognitive Status: Impaired/Different from baseline Area of Impairment: Safety/judgement;Awareness;Problem  solving      Orientation Level: Time       Safety/Judgement: Decreased awareness of safety;Decreased  awareness of deficits   Problem Solving: Slow processing General Comments: decreased attention to L, poor safety awareness with slight impulsivity             Pertinent Vitals/Pain Pain Assessment: No/denies pain Pain Score: 0-No pain     PT Goals (current goals can now be found in the care plan section) Acute Rehab PT Goals Patient Stated Goal: go home Progress towards PT goals: Progressing toward goals    Frequency    7X/week      PT Plan Current plan remains appropriate       AM-PAC PT "6 Clicks" Mobility   Outcome Measure  Help needed turning from your back to your side while in a flat bed without using bedrails?: None Help needed moving from lying on your back to sitting on the side of a flat bed without using bedrails?: None Help needed moving to and from a bed to a chair (including a wheelchair)?: A Little Help needed standing up from a chair using your arms (e.g., wheelchair or bedside chair)?: A Little Help needed to walk in hospital room?: A Little Help needed climbing 3-5 steps with a railing? : A Little 6 Click Score: 20    End of Session Equipment Utilized During Treatment: Gait belt Activity Tolerance: Patient tolerated treatment well Patient left: in bed;with call bell/phone within reach;with bed alarm set;with family/visitor present (spouse present) Nurse Communication: Mobility status PT Visit Diagnosis: Unsteadiness on feet (R26.81);Muscle weakness (generalized) (M62.81);Hemiplegia and hemiparesis Hemiplegia - Right/Left: Left Hemiplegia - dominant/non-dominant: Non-dominant Hemiplegia - caused by: Cerebral infarction     Time: 2952-8413 PT Time Calculation (min) (ACUTE ONLY): 38 min  Charges:  $Gait Training: 23-37 mins $Neuromuscular Re-education: 8-22 mins                    Julaine Fusi PTA 06/05/21, 2:45 PM

## 2021-06-05 NOTE — Progress Notes (Signed)
Dr. Priscella Mann present and patient asked if she could take a shower and MD gave her permission to take a shower.

## 2021-06-05 NOTE — Progress Notes (Signed)
Asked Dr. Priscella Mann for order parameters for betapace. MD gave order to hold med for SBP < 90 and HR < 50.

## 2021-06-06 ENCOUNTER — Inpatient Hospital Stay: Payer: Medicare HMO

## 2021-06-06 MED ORDER — APIXABAN 5 MG PO TABS: 5.0000 mg | ORAL_TABLET | Freq: Two times a day (BID) | ORAL | 0 refills | Status: AC

## 2021-06-06 MED ORDER — HYDROCHLOROTHIAZIDE 12.5 MG PO CAPS: 12.5000 mg | ORAL_CAPSULE | Freq: Every day | ORAL | Status: DC

## 2021-06-06 MED ORDER — HEPARIN SOD (PORK) LOCK FLUSH 100 UNIT/ML IV SOLN
250.0000 [IU] | INTRAVENOUS | Status: AC | PRN
  Administered 2021-06-06: 500 [IU]
  Filled 2021-06-06: qty 5

## 2021-06-06 MED ORDER — HYDROCHLOROTHIAZIDE 12.5 MG PO CAPS: 12.5000 mg | ORAL_CAPSULE | Freq: Every day | ORAL | 0 refills | Status: DC

## 2021-06-06 MED ORDER — ATORVASTATIN CALCIUM 40 MG PO TABS: 40.0000 mg | ORAL_TABLET | Freq: Every day | ORAL | 0 refills | Status: AC

## 2021-06-06 MED ORDER — HYDRALAZINE HCL 25 MG PO TABS
25.0000 mg | ORAL_TABLET | Freq: Three times a day (TID) | ORAL | Status: DC
  Administered 2021-06-06: 25 mg via ORAL
  Filled 2021-06-06: qty 1

## 2021-06-06 MED ORDER — SOTALOL HCL 80 MG PO TABS: 80.0000 mg | ORAL_TABLET | Freq: Two times a day (BID) | ORAL | 0 refills | Status: DC

## 2021-06-06 MED ORDER — ASPIRIN EC 81 MG PO TBEC: 81.0000 mg | DELAYED_RELEASE_TABLET | Freq: Every day | ORAL | 11 refills | Status: DC

## 2021-06-06 NOTE — Progress Notes (Addendum)
Physical Therapy Treatment Patient Details Name: Kelly Krause MRN: [id number] DOB: [birth year] Today's Date: 06/06/2021    History of Present Illness Pt is an 80 y/o F who presented on 06/03/21 with c/c of syncopal event after falling & hit her head. MRI of the brain ordered and was read as acute cortical/subcortical infarct of the right frontal operculum and right insula, encompassing 2.2 cm.  Additional tiny acute infarcts elsewhere within the right frontal operculum and right insula. Acute trace subdural hematoma overlying the anterior left frontal and temporal lobes-secondary to mechanical fall. PMH: HTN, stage 4 colon CA with lung metastasis, HLD, hx of SVT, hx of paroxysmal a-fib, depression, GERD, hx of esophageal CA    PT Comments    Pt seen for PT tx with step-daughter Kelly Krause present & pt gave permission for staff to reach out to her PRN). PT spent extra time discussing/educating pt & Kelly Krause on pt's L inattention, impaired vision, impulsivity & increased risk for falls with Kelly Krause observing L inattention during functional mobility. Pt is able to ambulate increased distances (400 ft total), first half without AD & CGA<>Min assist, 2nd half with RW & close supervision but pt with decreased attention to L & decreased reaction time to avoid obstacles. PT educated them on stair negotiation with compensatory pattern with use of RW. Reviewed importance of using RW at home to reduce fall risk, use of gait belt, as well importance of 24 hr supervision. Kelly Krause reports she will stay with pt tonight & will see if her son can stay with her while she works tomorrow but after that multiple family members have been organized to provide supervision at least until mid-August.     Follow Up Recommendations  Home health PT;Supervision/Assistance - 24 hour     Equipment Recommendations  Rolling walker with 5" wheels    Recommendations for Other Services       Precautions / Restrictions  Precautions Precautions: Fall Precaution Comments: L inattention Restrictions Weight Bearing Restrictions: No    Mobility  Bed Mobility Overal bed mobility: Modified Independent Bed Mobility: Supine to Sit     Supine to sit: HOB elevated;Modified independent (Device/Increase time)          Transfers Overall transfer level: Needs assistance Equipment used: None Transfers: Sit to/from Stand Sit to Stand: Supervision            Ambulation/Gait Ambulation/Gait assistance: Min assist;Min guard;Supervision Gait Distance (Feet): 400 Feet Assistive device: None;Rolling walker (2 wheeled) Gait Pattern/deviations: Step-through pattern;Decreased step length - left;Decreased step length - right;Decreased stride length Gait velocity: decreased & slows even further when ambulating with RW   General Gait Details: initial 200 ft without AD & min assist, latter 200 ft with RW & close supervision; pt with 1 LOB/staggering when ambulating without LOB & min assist to correct, poor awareness & attention to L & frequenly veering to L when ambulating with RW with pt avoiding obstacles at last second, confused turning L<>R x once   Stairs Stairs:  (verbally educated & provided demonstration for single step negotiation with RW with compensatory pattern with pt's step-daughter Kelly Krause) voicing understanding)           Wheelchair Mobility    Modified Rankin (Stroke Patients Only)       Balance Overall balance assessment: Needs assistance Sitting-balance support: No upper extremity supported;Feet supported Sitting balance-Leahy Scale: Good     Standing balance support: No upper extremity supported;During functional activity Standing balance-Leahy Scale: Poor  Cognition Arousal/Alertness: Awake/alert Behavior During Therapy: WFL for tasks assessed/performed;Impulsive Overall Cognitive Status: Impaired/Different from baseline Area of  Impairment: Safety/judgement;Awareness;Problem solving                         Safety/Judgement: Decreased awareness of safety;Decreased awareness of deficits Awareness: Anticipatory   General Comments: decreased attention to L, poor safety awareness with slight impulsivity      Exercises      General Comments General comments (skin integrity, edema, etc.): BP in LUE at beginning of session 139/64 mmHg (MAP 84), HR 53, SPO2 >90% throughout although pt with some wheezing (pt reports cough/sickness a couple of weeks ago with lingering wheezing)      Pertinent Vitals/Pain Pain Assessment: No/denies pain    Home Living                      Prior Function            PT Goals (current goals can now be found in the care plan section) Acute Rehab PT Goals Patient Stated Goal: go home PT Goal Formulation: With patient/family Time For Goal Achievement: 06/18/21 Potential to Achieve Goals: Good Progress towards PT goals: Progressing toward goals    Frequency    7X/week      PT Plan Current plan remains appropriate    Co-evaluation              AM-PAC PT "6 Clicks" Mobility   Outcome Measure  Help needed turning from your back to your side while in a flat bed without using bedrails?: None Help needed moving from lying on your back to sitting on the side of a flat bed without using bedrails?: None Help needed moving to and from a bed to a chair (including a wheelchair)?: A Little Help needed standing up from a chair using your arms (e.g., wheelchair or bedside chair)?: A Little Help needed to walk in hospital room?: A Little Help needed climbing 3-5 steps with a railing? : A Little 6 Click Score: 20    End of Session Equipment Utilized During Treatment: Gait belt Activity Tolerance: Patient tolerated treatment well Patient left: in chair;with call bell/phone within reach;with chair alarm set;with family/visitor present   PT Visit Diagnosis:  Unsteadiness on feet (R26.81);Muscle weakness (generalized) (M62.81);Hemiplegia and hemiparesis Hemiplegia - Right/Left: Left Hemiplegia - dominant/non-dominant: Non-dominant Hemiplegia - caused by: Cerebral infarction     Time: 3748-2707 PT Time Calculation (min) (ACUTE ONLY): 27 min  Charges:  $Gait Training: 8-22 mins $Therapeutic Activity: 8-22 mins                     Lavone Nian, PT, DPT 06/06/21, 10:56 AM    Waunita Schooner 06/06/2021, 10:52 AM

## 2021-06-06 NOTE — Progress Notes (Signed)
Emesis bags and new order for Hydralazine administered to patient at this time. Daughter Butch Penny at bedside. All patient needs met at this time. Will continue to assess.

## 2021-06-06 NOTE — Discharge Summary (Signed)
Physician Discharge Summary  Kelly Krause VZD:[id number] DOB: 26-Apr-1941 DOA: 06/03/2021  PCP: Sallee Lange, NP  Admit date: 06/03/2021 Discharge date: 06/06/2021  Admitted From: Home Disposition: Home with home health  Recommendations for Outpatient Follow-up:  Follow up with PCP in 1-2 weeks Reestablish care with cardiology Follow-up with neurology within 4 to 6 weeks  Home Health: Yes Equipment/Devices: No  Discharge Condition: Stable CODE STATUS: Full Diet recommendation: Heart healthy  Brief/Interim Summary: 80 y.o. female with medical history significant for hypertension, history of stage IV colon cancer with lung metastasis, hyperlipidemia, history of SVT, history of paroxysmal atrial fibrillation, depression, GERD, history of esophageal carcinoma, presents to the emergency department from home via EMS for chief concerns of syncopal event.   At bedside patient was able to tell me her full name, age, location of hospital, current calendar year.  There is no facial drooping or slurring of speech at bedside.   She reports that she woke up, felt dizzy, ambulated to bathroom, felt dizzy and fell and hit her head.   Telemetry neurology consulted on admission.  Patient's MRI did demonstrate a small acute/subacute cortical infarct with small area of subdural hematoma.  However the patient was given aspirin and Plavix load.  The case was discussed with inpatient neurology consult the following morning.  Antiplatelets were discontinued in the setting of small subdural.   Repeat head CT showed stability of bleed.  Case discussed at length with neurology.  Will recommend aspirin 81 mg monotherapy for now.  Within the course of about 2 weeks after discharge patient will require therapeutic anticoagulation.  On day of discharge had a lengthy conversation with the patient and daughter at bedside.  Explained discharge plan and posthospital care.  Patient will need therapeutic  anticoagulation however will not start until 06/21/2021.  Will recommend aspirin 81 mg, high intensity statin for posterior care at this time.  Home physical therapy services ordered.  Patient will discharge home with PCP follow-up in 1 to 2 weeks, neurology follow-up in 4 to 6 weeks.  Patient also needs to reestablish care with cardiology within 4 weeks.  Discharge Diagnoses:  Principal Problem:   Stroke [facility]) Active Problems:   Clinical depression   Essential (primary) hypertension   Cancer of esophagus (HCC)   Cancer of colon ([place])   SCC (squamous cell carcinoma of esophagus) (HCC)   Atrial fibrillation (HCC)   Cancer of cecum (HCC)   Hypertensive urgency   Sinus bradycardia   Acute CVA (cerebrovascular accident) ([place])  Right MCA acute CVA Intracranial atherosclerosis Trace left temporal and frontal lobe subdural hematoma, suspect traumatic Patient had a syncopal event and resultant head trauma Small subdural hematoma noted on MRI imaging in addition to an acute infarction Patient still with some residual slurred speech Inpatient neurology on consult 2D echo normal ejection fraction, grade 2 diastolic dysfunction Hemoglobin A1c 6.7 LDL 103 Discharge plan: Discharge home with home health services at this time.  Recommend aspirin 81 mg daily.  Patient will need therapeutic anticoagulation.  Because of subdural hematoma will not start until August 1.  Follow-up with neurology in 4 to 6 weeks.  Reestablish care with cardiology.  Continue blood pressure control with multimodal regimen.   History of atrial fibrillation Patient is rate controlled but not on anticoagulation Has not seen cardiology in years A. fib managed with aspirin by PCP [facility]A2DS2-VASc of at least 6 Plan: Discharge home continue sotalol for rate control.  Defer anticoagulation for now in setting of recent  SDH.  We will start Eliquis 5 mg p.o. twice daily on 06/21/2021   Essential hypertension Continue home  sotalol Continue home amlodipine Resume home ramipril Add hydrochlorothiazide Follow-up PCP   Depression/anxiety PTA bupropion PTA sertraline   GERD PTA PPI  Discharge Instructions  Discharge Instructions     Diet - low sodium heart healthy   Complete by: As directed    Increase activity slowly   Complete by: As directed       Allergies as of 06/06/2021       Reactions   Amoxicillin-pot Clavulanate Itching, Nausea Only, Other (See Comments)   Has patient had a PCN reaction causing immediate rash, facial/tongue/throat swelling, SOB or lightheadedness with hypotension: No Has patient had a PCN reaction causing severe rash involving mucus membranes or skin necrosis: No Has patient had a PCN reaction that required hospitalization: No Has patient had a PCN reaction occurring within the last 10 years: Yes If all of the above answers are "NO", then may proceed with Cephalosporin use.   Codeine Nausea Only, Other (See Comments)   GI Upset headaches   Demerol [meperidine] Itching, Nausea Only, Other (See Comments)   GI Upset, Headaches   Latex Hives, Itching   Morphine And Related Itching, Nausea Only, Other (See Comments)   Headaches, pt can still take    Oxycodone-acetaminophen Itching, Nausea Only   Tramadol Other (See Comments)    GI Upset   Buprenorphine Hcl Itching, Nausea Only, Other (See Comments)   Headaches   Fentanyl Itching, Nausea And Vomiting   Strawberry Extract Nausea And Vomiting, Rash   Tapentadol Rash        Medication List     STOP taking these medications    bacitracin ointment   simvastatin 40 MG tablet Commonly known as: ZOCOR       TAKE these medications    apixaban 5 MG Tabs tablet Commonly known as: ELIQUIS Take 1 tablet (5 mg total) by mouth 2 (two) times daily. Start taking on: June 21, 2021   aspirin EC 81 MG tablet Take 1 tablet (81 mg total) by mouth daily.   atorvastatin 40 MG tablet Commonly known as: Lipitor Take 1  tablet (40 mg total) by mouth daily.   BIOTIN PO Take 1 tablet by mouth daily.   buPROPion 150 MG 12 hr tablet Commonly known as: WELLBUTRIN SR Take 1 tablet (150 mg total) by mouth 2 (two) times daily.   diphenhydrAMINE 25 mg capsule Commonly known as: BENADRYL Take 50-75 mg every 6 (six) hours as needed by mouth for itching.   fexofenadine 180 MG tablet Commonly known as: ALLEGRA Take 180 mg by mouth at bedtime.   hydrochlorothiazide 12.5 MG capsule Commonly known as: MICROZIDE Take 1 capsule (12.5 mg total) by mouth daily. Start taking on: June 07, 2021   MULTIVITAMIN PO Take 1 tablet daily by mouth.   OCUVITE ADULT 50+ PO Take 1 tablet by mouth daily.   pantoprazole 40 MG tablet Commonly known as: PROTONIX Take 1 tablet (40 mg total) by mouth 2 (two) times daily.   ramipril 10 MG capsule Commonly known as: ALTACE TAKE ONE CAPSULE BY MOUTH ONCE DAILY. MUST SCHEDULE APPOINTMENT FOR NOVEMBER   senna-docusate 8.6-50 MG tablet Commonly known as: Senokot-S Take 2 tablets by mouth at bedtime.   sotalol 80 MG tablet Commonly known as: BETAPACE Take 1 tablet (80 mg total) by mouth 2 (two) times daily.   Vitamin D-3 25 MCG (1000 UT) Caps Take 1,000  Units daily by mouth.       ASK your doctor about these medications    amLODipine 10 MG tablet Commonly known as: NORVASC TAKE ONE TABLET BY MOUTH ONCE DAILY-NEED  APPOINTMENT   fluticasone 50 MCG/ACT nasal spray Commonly known as: FLONASE USE ONE SPRAY(S) IN EACH NOSTRIL ONCE DAILY   sertraline 25 MG tablet Commonly known as: ZOLOFT Take 1 tablet (25 mg total) by mouth daily.        Follow-up Information     Gauger, Victoriano Lain, NP. Schedule an appointment as soon as possible for a visit in 1 week(s).   Specialty: Internal Medicine Contact information: [address] [place] [address] [phone]         Anabel Bene, MD. Schedule an appointment as soon as possible for a visit in 4  week(s).   Specialty: Neurology Why: Any provider OK.  Follow up in 4-6 weeks Contact information: [place] [facility] [place] [address] [phone]         Teodoro Spray, MD Follow up in 4 week(s).   Specialty: Cardiology Contact information: [place] [address] [phone]                Allergies  Allergen Reactions   Amoxicillin-Pot Clavulanate Itching, Nausea Only and Other (See Comments)    Has patient had a PCN reaction causing immediate rash, facial/tongue/throat swelling, SOB or lightheadedness with hypotension: No Has patient had a PCN reaction causing severe rash involving mucus membranes or skin necrosis: No Has patient had a PCN reaction that required hospitalization: No Has patient had a PCN reaction occurring within the last 10 years: Yes If all of the above answers are "NO", then may proceed with Cephalosporin use.    Codeine Nausea Only and Other (See Comments)    GI Upset headaches   Demerol [Meperidine] Itching, Nausea Only and Other (See Comments)    GI Upset, Headaches    Latex Hives and Itching   Morphine And Related Itching, Nausea Only and Other (See Comments)    Headaches, pt can still take    Oxycodone-Acetaminophen Itching and Nausea Only   Tramadol Other (See Comments)     GI Upset   Buprenorphine Hcl Itching, Nausea Only and Other (See Comments)    Headaches   Fentanyl Itching and Nausea And Vomiting   Strawberry Extract Nausea And Vomiting and Rash   Tapentadol Rash    Consultations: Neurology   Procedures/Studies: CT ANGIO HEAD NECK W WO CM  Result Date: 06/03/2021 CLINICAL DATA:  Stroke/TIA. EXAM: CT ANGIOGRAPHY HEAD AND NECK TECHNIQUE: Multidetector CT imaging of the head and neck was performed using the standard protocol during bolus administration of intravenous contrast. Multiplanar CT image reconstructions and MIPs were obtained to evaluate the vascular  anatomy. Carotid stenosis measurements (when applicable) are obtained utilizing NASCET criteria, using the distal internal carotid diameter as the denominator. CONTRAST:  1mL OMNIPAQUE IOHEXOL 350 MG/ML SOLN COMPARISON:  None. FINDINGS: CTA NECK FINDINGS Aortic arch: Great vessel origins are patent. Approximately 40% stenosis of the left subclavian artery origin due to atherosclerosis. Right carotid system: Mixed calcific and noncalcific atherosclerosis at the carotid bifurcation without greater than 50% stenosis. Retropharyngeal course of the ICA. Left carotid system: Atherosclerosis of the common carotid artery and carotid bifurcation without greater than 50% stenosis. Vertebral arteries: Moderate right and severe left vertebral artery origin stenosis due to atherosclerosis. Remainder of the vertebral arteries are patent without  significant stenosis. Skeleton: Evaluated on same day CT cervical spine and CT head. Other neck: No acute abnormality. Upper chest: Tree in bud nodular opacities in the lateral left upper lobe (see series 6, image 4). Biapical pleuroparenchymal scarring. Review of the MIP images confirms the above findings CTA HEAD FINDINGS Anterior circulation: Bilateral intracranial ICAs, MCAs, and ACAs are patent. aSevere right proximal A2 ACA stenosis with more distal moderate narrowing. No aneurysm identified. Posterior circulation: Bilateral intradural vertebral arteries, the basilar artery, and the cerebral arteries are patent. Moderate stenosis of the proximal left P2 PCA. Small left P1 PCA with left posterior communicating artery, anatomic variant. No aneurysm identified. Venous sinuses: As permitted by contrast timing, patent. Bilateral transverse sinus arachnoid granulations. Review of the MIP images confirms the above findings IMPRESSION: CTA Head: 1. No large vessel occlusion. 2. Severe right proximal A2 ACA stenosis with more distal moderate ACA narrowing. 3. Moderate proximal left P2 PCA  stenosis. CTA Neck: 1. Moderate right and severe left vertebral artery origin stenosis. Remainder of the vertebral arteries are patent without significant stenosis. 2. Approximately 40% stenosis of the left subclavian artery origin. 3. Bilateral carotid bifurcation atherosclerosis without greater than 50% stenosis. 4. Tree in bud nodular opacities in the lateral left upper lobe, most likely infectious/inflammatory. Recommend dedicated chest imaging. Electronically Signed   By: Margaretha Sheffield MD   On: 06/03/2021 17:55   CT HEAD WO CONTRAST  Result Date: 06/04/2021 CLINICAL DATA:  Stroke and subdural hematoma on MRI, follow-up EXAM: CT HEAD WITHOUT CONTRAST TECHNIQUE: Contiguous axial images were obtained from the base of the skull through the vertex without intravenous contrast. COMPARISON:  Recent CT imaging, correlation made with MRI 05/04/2021 FINDINGS: Brain: No acute intracranial hemorrhage or mass effect. Hypoattenuation and loss of gray-white differentiation are identified along the right insula and adjacent frontal lobe similar to prior CT. No new loss of gray-white differentiation. The trace left frontotemporal convexity subdural hematoma on MRI is not definitely identified on CT. Stable findings of chronic microvascular ischemic changes. Ventricles are stable in size. Vascular: No new finding. Skull: Calvarium is unremarkable. Sinuses/Orbits: Chronic right maxillary sinusitis. No acute abnormality. Other: Persistent left frontal scalp soft tissue swelling and hematoma. IMPRESSION: Evolving acute right MCA territory infarction as seen on prior CT and MRI. No acute intracranial hemorrhage. Trace left subdural hematoma on MRI is not definitely seen by CT. Electronically Signed   By: Macy Mis M.D.   On: 06/04/2021 13:31   CT HEAD WO CONTRAST  Result Date: 06/03/2021 CLINICAL DATA:  Head and neck pain after fall, left frontal injury. Syncope EXAM: CT HEAD WITHOUT CONTRAST CT CERVICAL SPINE  WITHOUT CONTRAST TECHNIQUE: Multidetector CT imaging of the head and cervical spine was performed following the standard protocol without intravenous contrast. Multiplanar CT image reconstructions of the cervical spine were also generated. COMPARISON:  01/11/2012, 07/22/2010 FINDINGS: CT HEAD FINDINGS Brain: No evidence of acute infarction, hemorrhage, hydrocephalus, extra-axial collection or mass lesion/mass effect. Moderate low-density changes within the periventricular and subcortical white matter compatible with chronic microvascular ischemic change. Mild diffuse cerebral volume loss. Vascular: No hyperdense vessel or unexpected calcification. Skull: Normal. Negative for fracture or focal lesion. Sinuses/Orbits: Mild mucosal thickening within the right sphenoid sinus. Orbital structures appear unremarkable. Other: Soft tissue swelling with small scalp hematoma overlying the left frontal bone measuring 1.9 x 0.5 x 1.5 cm. CT CERVICAL SPINE FINDINGS Alignment: Facet joints are aligned without dislocation or traumatic listhesis. Dens and lateral masses are aligned. Straightening with  slight reversal of the cervical lordosis. Skull base and vertebrae: No acute fracture. No primary bone lesion or focal pathologic process. Bilateral C2-3 facet joints are fused. Soft tissues and spinal canal: No prevertebral fluid or swelling. No visible canal hematoma. Slight medial course of the right internal carotid artery. Disc levels: Mild multilevel disc height loss with endplate spurring. Left greater than right multilevel facet arthropathy, most pronounced at C3-4 and C4-5. Upper chest: Right apical pleuroparenchymal scarring. Other: None. IMPRESSION: 1. No acute intracranial findings. 2. Soft tissue swelling with small scalp hematoma overlying the left frontal bone. 3. No evidence of acute fracture or traumatic listhesis of the cervical spine. 4. Moderate chronic microvascular ischemic changes and cerebral volume loss. 5.  Mild multilevel degenerative disc and facet arthropathy of the cervical spine. Electronically Signed   By: Davina Poke D.O.   On: 06/03/2021 15:07   CT CERVICAL SPINE WO CONTRAST  Result Date: 06/03/2021 CLINICAL DATA:  Head and neck pain after fall, left frontal injury. Syncope EXAM: CT HEAD WITHOUT CONTRAST CT CERVICAL SPINE WITHOUT CONTRAST TECHNIQUE: Multidetector CT imaging of the head and cervical spine was performed following the standard protocol without intravenous contrast. Multiplanar CT image reconstructions of the cervical spine were also generated. COMPARISON:  01/11/2012, 07/22/2010 FINDINGS: CT HEAD FINDINGS Brain: No evidence of acute infarction, hemorrhage, hydrocephalus, extra-axial collection or mass lesion/mass effect. Moderate low-density changes within the periventricular and subcortical white matter compatible with chronic microvascular ischemic change. Mild diffuse cerebral volume loss. Vascular: No hyperdense vessel or unexpected calcification. Skull: Normal. Negative for fracture or focal lesion. Sinuses/Orbits: Mild mucosal thickening within the right sphenoid sinus. Orbital structures appear unremarkable. Other: Soft tissue swelling with small scalp hematoma overlying the left frontal bone measuring 1.9 x 0.5 x 1.5 cm. CT CERVICAL SPINE FINDINGS Alignment: Facet joints are aligned without dislocation or traumatic listhesis. Dens and lateral masses are aligned. Straightening with slight reversal of the cervical lordosis. Skull base and vertebrae: No acute fracture. No primary bone lesion or focal pathologic process. Bilateral C2-3 facet joints are fused. Soft tissues and spinal canal: No prevertebral fluid or swelling. No visible canal hematoma. Slight medial course of the right internal carotid artery. Disc levels: Mild multilevel disc height loss with endplate spurring. Left greater than right multilevel facet arthropathy, most pronounced at C3-4 and C4-5. Upper chest: Right  apical pleuroparenchymal scarring. Other: None. IMPRESSION: 1. No acute intracranial findings. 2. Soft tissue swelling with small scalp hematoma overlying the left frontal bone. 3. No evidence of acute fracture or traumatic listhesis of the cervical spine. 4. Moderate chronic microvascular ischemic changes and cerebral volume loss. 5. Mild multilevel degenerative disc and facet arthropathy of the cervical spine. Electronically Signed   By: Davina Poke D.O.   On: 06/03/2021 15:07   MR BRAIN WO CONTRAST  Result Date: 06/03/2021 CLINICAL DATA:  Neuro deficit, acute, stroke suspected. Additional provided: Loss of consciousness, stroke like symptoms. EXAM: MRI HEAD WITHOUT CONTRAST TECHNIQUE: Multiplanar, multiecho pulse sequences of the brain and surrounding structures were obtained without intravenous contrast. COMPARISON:  CT angiogram head/neck 06/03/2021. Noncontrast head CT performed earlier today 06/03/2021. Brain MRI 08/14/2014. FINDINGS: Brain: Mild generalized cerebral and cerebellar atrophy. Acute cortical/subcortical infarct within the right frontal operculum and right insula encompassing 2.2 cm. Additional tiny acute cortical infarcts elsewhere within the right frontal operculum and right insula. A small white matter infarct is also present within the right precentral gyrus. There is a trace, likely acute, subdural hematoma measuring 1-2 mm  in thickness overlying the anterior left frontal lobe and anterior left temporal lobe. Moderate multifocal T2/FLAIR hyperintensity within the cerebral white matter, nonspecific but compatible with chronic small vessel ischemic disease. To a lesser degree, chronic small vessel ischemic changes are also present within the deep gray nuclei and pons. No evidence of an intracranial mass. No extra-axial fluid collection. No midline shift. Vascular: Expected proximal arterial flow voids. Skull and upper cervical spine: No focal marrow lesion. Sinuses/Orbits: Visualized  orbits show no acute finding. Small mucous retention cyst within the right frontal sinus. Trace bilateral ethmoid sinus mucosal thickening. Small volume frothy secretions within the left maxillary sinus. Complete opacification of the right maxillary sinus. Other: Redemonstrated left anterior scalp, forehead and left maxillofacial soft tissue swelling/hematoma. These results were called by telephone at the time of interpretation on 06/03/2021 at 9:01 pm to provider Dr. Charna Archer, who verbally acknowledged these results. IMPRESSION: Acute cortical/subcortical infarct within the right frontal operculum and right insula, encompassing 2.2 cm. Additional tiny acute infarcts elsewhere within the right frontal operculum and right insula. A small white matter infarct is also present within the right precentral gyrus. These infarcts are located within the right MCA vascular territory. Trace acute subdural hematoma overlying the anterior left frontal and temporal lobes, measuring 1-2 mm in thickness. Moderate chronic small vessel ischemic changes within the cerebral white matter, progressed from the brain MRI of 08/14/2014. To a lesser degree, chronic small vessel ischemic changes are also present within the deep gray nuclei and pons. Mild generalized parenchymal atrophy. Paranasal sinus disease, as described. Redemonstrated left anterior scalp, forehead and left maxillofacial soft tissue swelling/hematoma. Electronically Signed   By: Kellie Simmering DO   On: 06/03/2021 21:02   DG Chest Port 1 View  Result Date: 06/06/2021 CLINICAL DATA:  Cough a thick sputum EXAM: PORTABLE CHEST 1 VIEW COMPARISON:  06/03/2021 FINDINGS: Port in the anterior chest wall with tip in distal SVC. Normal mediastinum and cardiac silhouette. Normal pulmonary vasculature. Mild scarring at the RIGHT lung base. No evidence of effusion, infiltrate, or pneumothorax. No acute bony abnormality. IMPRESSION: No acute cardiopulmonary process. Electronically Signed    By: Suzy Bouchard M.D.   On: 06/06/2021 10:34   DG Chest Portable 1 View  Result Date: 06/03/2021 CLINICAL DATA:  Recent syncopal episode, initial encounter EXAM: PORTABLE CHEST 1 VIEW COMPARISON:  10/03/2017 CT FINDINGS: Cardiac shadow is within normal limits. Right chest wall port is noted in satisfactory position. Mild scarring is noted in the right lung similar to that seen on prior exam. Postsurgical changes are seen in the right base. No acute infiltrate or sizable effusion is noted. IMPRESSION: Chronic scarring in the right lung base. No acute abnormality noted. Electronically Signed   By: Inez Catalina M.D.   On: 06/03/2021 19:36   ECHOCARDIOGRAM LIMITED BUBBLE STUDY  Result Date: 06/04/2021    ECHOCARDIOGRAM LIMITED REPORT   Patient Name:   Kelly Krause Date of Exam: 06/04/2021 Medical Rec #:  [id number]       Height:       67.0 in Accession #:    [id number]      Weight:       190.9 lb Date of Birth:  May 13, 1941        BSA:          1.983 m Patient Age:    9 years        BP:           198/73 mmHg Patient Gender: F  HR:           66 bpm. Exam Location:  [facility] Procedure: Limited Echo, Limited Color Doppler, Cardiac Doppler and Saline            Contrast Bubble Study Indications:     I63.9 Stroke  History:         Patient has no prior history of Echocardiogram examinations.                  Arrythmias:Atrial Fibrillation; Risk Factors:Hypertension and                  Dyslipidemia. History of radiation/chemotherapy; History of                  heart ablation.  Sonographer:     Charmayne Sheer RDCS (AE) Referring Phys:  [id number] AMY N [facility] Diagnosing Phys: Ida Rogue MD IMPRESSIONS  1. Left ventricular ejection fraction, by estimation, is 60 to 65%. The left ventricle has normal function. The left ventricle has no regional wall motion abnormalities. Left ventricular diastolic parameters are consistent with Grade II diastolic dysfunction (pseudonormalization).  2. Right ventricular systolic  function is normal. The right ventricular size is normal.  3. The mitral valve is normal in structure. Mild mitral valve regurgitation. No evidence of mitral stenosis.  4. Tricuspid valve regurgitation is moderate.  5. Agitated saline contrast bubble study was negative, with no evidence of any interatrial shunt. FINDINGS  Left Ventricle: Left ventricular ejection fraction, by estimation, is 60 to 65%. The left ventricle has normal function. The left ventricle has no regional wall motion abnormalities. The left ventricular internal cavity size was normal in size. There is  no left ventricular hypertrophy. Left ventricular diastolic parameters are consistent with Grade II diastolic dysfunction (pseudonormalization). Right Ventricle: The right ventricular size is normal. No increase in right ventricular wall thickness. Right ventricular systolic function is normal. Left Atrium: Left atrial size was normal in size. Right Atrium: Right atrial size was normal in size. Pericardium: There is no evidence of pericardial effusion. Mitral Valve: The mitral valve is normal in structure. Mild mitral valve regurgitation. No evidence of mitral valve stenosis. MV peak gradient, 3.9 mmHg. The mean mitral valve gradient is 1.0 mmHg. Tricuspid Valve: The tricuspid valve is normal in structure. Tricuspid valve regurgitation is moderate . No evidence of tricuspid stenosis. Aortic Valve: The aortic valve is normal in structure. Aortic valve regurgitation is not visualized. Mild to moderate aortic valve sclerosis/calcification is present, without any evidence of aortic stenosis. Pulmonic Valve: The pulmonic valve was normal in structure. Pulmonic valve regurgitation is not visualized. No evidence of pulmonic stenosis. Aorta: The aortic root is normal in size and structure. Venous: The inferior vena cava is normal in size with greater than 50% respiratory variability, suggesting right atrial pressure of 3 mmHg. IAS/Shunts: No atrial level  shunt detected by color flow Doppler. Agitated saline contrast was given intravenously to evaluate for intracardiac shunting. Agitated saline contrast bubble study was negative, with no evidence of any interatrial shunt. LEFT VENTRICLE PLAX 2D LVIDd:         4.80 cm LVIDs:         3.30 cm LV PW:         0.90 cm LV IVS:        0.90 cm  LEFT ATRIUM         Index LA diam:    4.50 cm 2.27 cm/m  PULMONIC VALVE PV Vmax:  0.92 m/s PV Vmean:      67.200 cm/s PV VTI:        0.225 m PV Peak grad:  3.4 mmHg PV Mean grad:  2.0 mmHg  MITRAL VALVE MV Area (PHT): 3.93 cm MV Peak grad:  3.9 mmHg MV Mean grad:  1.0 mmHg MV Vmax:       0.99 m/s MV Vmean:      57.0 cm/s MV Decel Time: 193 msec MV E velocity: 88.70 cm/s MV A velocity: 69.40 cm/s MV E/A ratio:  1.28 Ida Rogue MD Electronically signed by Ida Rogue MD Signature Date/Time: 06/04/2021/4:41:43 PM    Final    (Echo, Carotid, EGD, Colonoscopy, ERCP)    Subjective: Patient seen and examined on the day of discharge.  She is stable in no distress.  She is stable for discharge home.  Post discharge plan explained in detail.  Discharge Exam: Vitals:   06/06/21 0435 06/06/21 0729  BP: (!) 154/67 (!) 187/61  Pulse: (!) 52 (!) 52  Resp: 16 18  Temp: 98 F (36.7 C) 98.1 F (36.7 C)  SpO2: 93% 94%   Vitals:   06/05/21 1630 06/05/21 1907 06/06/21 0435 06/06/21 0729  BP: (!) 152/61 (!) 147/57 (!) 154/67 (!) 187/61  Pulse: (!) 50 (!) 55 (!) 52 (!) 52  Resp: 16 18 16 18   Temp: 97.9 F (36.6 C) 98.3 F (36.8 C) 98 F (36.7 C) 98.1 F (36.7 C)  TempSrc: Oral   Oral  SpO2: 97% 96% 93% 94%  Weight:      Height:        General: Pt is alert, awake, not in acute distress Cardiovascular: RRR, S1/S2 +, no rubs, no gallops Respiratory: CTA bilaterally, no wheezing, no rhonchi Abdominal: Soft, NT, ND, bowel sounds + Extremities: no edema, no cyanosis    The results of significant diagnostics from this hospitalization (including imaging,  microbiology, ancillary and laboratory) are listed below for reference.     Microbiology: Recent Results (from the past 240 hour(s))  Resp Panel by RT-PCR (Flu A&B, Covid) Nasopharyngeal Swab     Status: None   Collection Time: 06/03/21  7:07 PM   Specimen: Nasopharyngeal Swab; Nasopharyngeal(NP) swabs in vial transport medium  Result Value Ref Range Status   SARS Coronavirus 2 by RT PCR NEGATIVE NEGATIVE Final    Comment: (NOTE) SARS-CoV-2 target nucleic acids are NOT DETECTED.  The SARS-CoV-2 RNA is generally detectable in upper respiratory specimens during the acute phase of infection. The lowest concentration of SARS-CoV-2 viral copies this assay can detect is 138 copies/mL. A negative result does not preclude SARS-Cov-2 infection and should not be used as the sole basis for treatment or other patient management decisions. A negative result may occur with  improper specimen collection/handling, submission of specimen other than nasopharyngeal swab, presence of viral mutation(s) within the areas targeted by this assay, and inadequate number of viral copies(<138 copies/mL). A negative result must be combined with clinical observations, patient history, and epidemiological information. The expected result is Negative.  Fact Sheet for Patients:  EntrepreneurPulse.com.au  Fact Sheet for Healthcare Providers:  IncredibleEmployment.be  This test is no t yet approved or cleared by the [place] FDA and  has been authorized for detection and/or diagnosis of SARS-CoV-2 by FDA under an Emergency Use Authorization (EUA). This EUA will remain  in effect (meaning this test can be used) for the duration of the COVID-19 declaration under Section 564(b)(1) of the Act, 21 U.S.C.section 360bbb-3(b)(1), unless the  authorization is terminated  or revoked sooner.       Influenza A by PCR NEGATIVE NEGATIVE Final   Influenza B by PCR NEGATIVE NEGATIVE  Final    Comment: (NOTE) The Xpert Xpress SARS-CoV-2/FLU/RSV plus assay is intended as an aid in the diagnosis of influenza from Nasopharyngeal swab specimens and should not be used as a sole basis for treatment. Nasal washings and aspirates are unacceptable for Xpert Xpress SARS-CoV-2/FLU/RSV testing.  Fact Sheet for Patients: EntrepreneurPulse.com.au  Fact Sheet for Healthcare Providers: IncredibleEmployment.be  This test is not yet approved or cleared by the [place] FDA and has been authorized for detection and/or diagnosis of SARS-CoV-2 by FDA under an Emergency Use Authorization (EUA). This EUA will remain in effect (meaning this test can be used) for the duration of the COVID-19 declaration under Section 564(b)(1) of the Act, 21 U.S.C. section 360bbb-3(b)(1), unless the authorization is terminated or revoked.  Performed at [facility], [address] [place], [place] [address]      Labs: BNP (last 3 results) No results for input(s): BNP in the last 8760 hours. Basic Metabolic Panel: Recent Labs  Lab 06/03/21 1412  NA 138  K 3.8  CL 99  CO2 31  GLUCOSE 107*  BUN 17  CREATININE 0.83  CALCIUM 9.1   Liver Function Tests: Recent Labs  Lab 06/03/21 1412  AST 27  ALT 24  ALKPHOS 69  BILITOT 0.8  PROT 7.6  ALBUMIN 3.7   No results for input(s): LIPASE, AMYLASE in the last 168 hours. No results for input(s): AMMONIA in the last 168 hours. CBC: Recent Labs  Lab 06/03/21 1412  WBC 7.7  NEUTROABS 4.8  HGB 12.6  HCT 38.2  MCV 97.9  PLT 226   Cardiac Enzymes: No results for input(s): CKTOTAL, CKMB, CKMBINDEX, TROPONINI in the last 168 hours. BNP: Invalid input(s): POCBNP CBG: No results for input(s): GLUCAP in the last 168 hours. D-Dimer No results for input(s): DDIMER in the last 72 hours. Hgb A1c Recent Labs    06/04/21 0635  HGBA1C 6.7*   Lipid Profile Recent Labs    06/04/21 0635   CHOL 179  HDL 59  LDLCALC 103*  TRIG 87  CHOLHDL 3.0   Thyroid function studies No results for input(s): TSH, T4TOTAL, T3FREE, THYROIDAB in the last 72 hours.  Invalid input(s): FREET3 Anemia work up No results for input(s): VITAMINB12, FOLATE, FERRITIN, TIBC, IRON, RETICCTPCT in the last 72 hours. Urinalysis No results found for: COLORURINE, APPEARANCEUR, [place], [place], GLUCOSEU, [place], [place], [place], PROTEINUR, UROBILINOGEN, NITRITE, LEUKOCYTESUR Sepsis Labs Invalid input(s): PROCALCITONIN,  WBC,  LACTICIDVEN Microbiology Recent Results (from the past 240 hour(s))  Resp Panel by RT-PCR (Flu A&B, Covid) Nasopharyngeal Swab     Status: None   Collection Time: 06/03/21  7:07 PM   Specimen: Nasopharyngeal Swab; Nasopharyngeal(NP) swabs in vial transport medium  Result Value Ref Range Status   SARS Coronavirus 2 by RT PCR NEGATIVE NEGATIVE Final    Comment: (NOTE) SARS-CoV-2 target nucleic acids are NOT DETECTED.  The SARS-CoV-2 RNA is generally detectable in upper respiratory specimens during the acute phase of infection. The lowest concentration of SARS-CoV-2 viral copies this assay can detect is 138 copies/mL. A negative result does not preclude SARS-Cov-2 infection and should not be used as the sole basis for treatment or other patient management decisions. A negative result may occur with  improper specimen collection/handling, submission of specimen other than nasopharyngeal swab, presence of viral mutation(s) within the areas  targeted by this assay, and inadequate number of viral copies(<138 copies/mL). A negative result must be combined with clinical observations, patient history, and epidemiological information. The expected result is Negative.  Fact Sheet for Patients:  EntrepreneurPulse.com.au  Fact Sheet for Healthcare Providers:  IncredibleEmployment.be  This test is no t yet approved or cleared by the [place] FDA and  has been authorized for detection and/or diagnosis of SARS-CoV-2 by FDA under an Emergency Use Authorization (EUA). This EUA will remain  in effect (meaning this test can be used) for the duration of the COVID-19 declaration under Section 564(b)(1) of the Act, 21 U.S.C.section 360bbb-3(b)(1), unless the authorization is terminated  or revoked sooner.       Influenza A by PCR NEGATIVE NEGATIVE Final   Influenza B by PCR NEGATIVE NEGATIVE Final    Comment: (NOTE) The Xpert Xpress SARS-CoV-2/FLU/RSV plus assay is intended as an aid in the diagnosis of influenza from Nasopharyngeal swab specimens and should not be used as a sole basis for treatment. Nasal washings and aspirates are unacceptable for Xpert Xpress SARS-CoV-2/FLU/RSV testing.  Fact Sheet for Patients: EntrepreneurPulse.com.au  Fact Sheet for Healthcare Providers: IncredibleEmployment.be  This test is not yet approved or cleared by the [place] FDA and has been authorized for detection and/or diagnosis of SARS-CoV-2 by FDA under an Emergency Use Authorization (EUA). This EUA will remain in effect (meaning this test can be used) for the duration of the COVID-19 declaration under Section 564(b)(1) of the Act, 21 U.S.C. section 360bbb-3(b)(1), unless the authorization is terminated or revoked.  Performed at [facility], [address], [place], [place] [address]      Time coordinating discharge: Over 30 minutes  SIGNED:   Sidney Ace, MD  [facility] 06/06/2021, 2:13 PM Pager   If 7PM-7AM, please contact night-coverage

## 2021-06-06 NOTE — Progress Notes (Signed)
Kelly Krause to be D/C'd Home per MD order.  Discussed prescriptions and follow up appointments with the patient. Prescriptions were sent to patient's pharmacy, eliquis coupon provided,  medication list explained in detail. Pt verbalized understanding.   Tele box removed and returnedd. Port deaccessed by IV team. Pt denies pain at this time. No complaints noted.  An After Visit Summary was printed and given to the patient. Patient escorted via [place], and D/C home via private auto.  [name]

## 2021-06-06 NOTE — TOC Transition Note (Signed)
Transition of Care [facility]) - CM/SW Discharge Note   Patient Details  Name: Kelly Krause MRN: [id number] Date of Birth: 04-15-1941  [facility]) CM/SW Contact:  Alberteen Sam, LCSW Phone Number: 06/06/2021, 12:22 PM   Clinical Narrative:     CSW informed Staci with [facility] that patient is discharging today, has orders in for [facility] PT and OT.   No other discharge needs identified at this time.   Final next level of care: [place] Barriers to Discharge: No Barriers Identified   Patient Goals and CMS Choice Patient states their goals for this hospitalization and ongoing recovery are:: to go home CMS Medicare.gov Compare Post Acute Care list provided to:: Patient Choice offered to / list presented to : Patient  Discharge Placement                       Discharge Plan and Services     Post Acute Care Choice: Home Health                    HH Arranged: PT, OT [facility] Agency: [place] Date [place]: 06/06/21 Time [place]: 1222 Representative spoke with at [place]: Staci  Social Determinants of Health ([place]) Interventions     Readmission Risk Interventions No flowsheet data found.

## 2021-06-09 DIAGNOSIS — Z7409 Other reduced mobility: Secondary | ICD-10-CM | POA: Diagnosis not present

## 2021-06-09 DIAGNOSIS — E785 Hyperlipidemia, unspecified: Secondary | ICD-10-CM | POA: Diagnosis not present

## 2021-06-09 DIAGNOSIS — M2040 Other hammer toe(s) (acquired), unspecified foot: Secondary | ICD-10-CM | POA: Diagnosis not present

## 2021-06-09 DIAGNOSIS — Z09 Encounter for follow-up examination after completed treatment for conditions other than malignant neoplasm: Secondary | ICD-10-CM | POA: Diagnosis not present

## 2021-06-09 DIAGNOSIS — R531 Weakness: Secondary | ICD-10-CM | POA: Diagnosis not present

## 2021-06-09 DIAGNOSIS — I10 Essential (primary) hypertension: Secondary | ICD-10-CM | POA: Diagnosis not present

## 2021-06-09 DIAGNOSIS — Z85118 Personal history of other malignant neoplasm of bronchus and lung: Secondary | ICD-10-CM | POA: Diagnosis not present

## 2021-06-09 DIAGNOSIS — Z87891 Personal history of nicotine dependence: Secondary | ICD-10-CM | POA: Diagnosis not present

## 2021-06-09 DIAGNOSIS — I480 Paroxysmal atrial fibrillation: Secondary | ICD-10-CM | POA: Diagnosis not present

## 2021-06-09 DIAGNOSIS — M5116 Intervertebral disc disorders with radiculopathy, lumbar region: Secondary | ICD-10-CM | POA: Diagnosis not present

## 2021-06-09 DIAGNOSIS — I471 Supraventricular tachycardia: Secondary | ICD-10-CM | POA: Diagnosis not present

## 2021-06-09 DIAGNOSIS — M5136 Other intervertebral disc degeneration, lumbar region: Secondary | ICD-10-CM | POA: Diagnosis not present

## 2021-06-09 DIAGNOSIS — K219 Gastro-esophageal reflux disease without esophagitis: Secondary | ICD-10-CM | POA: Diagnosis not present

## 2021-06-09 DIAGNOSIS — R6889 Other general symptoms and signs: Secondary | ICD-10-CM | POA: Diagnosis not present

## 2021-06-09 DIAGNOSIS — D649 Anemia, unspecified: Secondary | ICD-10-CM | POA: Diagnosis not present

## 2021-06-09 DIAGNOSIS — Z8673 Personal history of transient ischemic attack (TIA), and cerebral infarction without residual deficits: Secondary | ICD-10-CM | POA: Diagnosis not present

## 2021-06-09 DIAGNOSIS — M2010 Hallux valgus (acquired), unspecified foot: Secondary | ICD-10-CM | POA: Diagnosis not present

## 2021-06-09 DIAGNOSIS — M5030 Other cervical disc degeneration, unspecified cervical region: Secondary | ICD-10-CM | POA: Diagnosis not present

## 2021-06-09 DIAGNOSIS — S0083XD Contusion of other part of head, subsequent encounter: Secondary | ICD-10-CM | POA: Diagnosis not present

## 2021-06-09 DIAGNOSIS — M9979 Connective tissue and disc stenosis of intervertebral foramina of abdomen and other regions: Secondary | ICD-10-CM | POA: Diagnosis not present

## 2021-06-09 DIAGNOSIS — Z7982 Long term (current) use of aspirin: Secondary | ICD-10-CM | POA: Diagnosis not present

## 2021-06-09 DIAGNOSIS — Z85038 Personal history of other malignant neoplasm of large intestine: Secondary | ICD-10-CM | POA: Diagnosis not present

## 2021-06-09 DIAGNOSIS — S065X9A Traumatic subdural hemorrhage with loss of consciousness of unspecified duration, initial encounter: Secondary | ICD-10-CM | POA: Diagnosis not present

## 2021-06-09 DIAGNOSIS — Z8501 Personal history of malignant neoplasm of esophagus: Secondary | ICD-10-CM | POA: Diagnosis not present

## 2021-06-09 DIAGNOSIS — I639 Cerebral infarction, unspecified: Secondary | ICD-10-CM | POA: Diagnosis not present

## 2021-06-09 DIAGNOSIS — M47817 Spondylosis without myelopathy or radiculopathy, lumbosacral region: Secondary | ICD-10-CM | POA: Diagnosis not present

## 2021-06-09 DIAGNOSIS — R69 Illness, unspecified: Secondary | ICD-10-CM | POA: Diagnosis not present

## 2021-06-09 DIAGNOSIS — H5347 Heteronymous bilateral field defects: Secondary | ICD-10-CM | POA: Diagnosis not present

## 2021-06-09 DIAGNOSIS — S065X0D Traumatic subdural hemorrhage without loss of consciousness, subsequent encounter: Secondary | ICD-10-CM | POA: Diagnosis not present

## 2021-06-09 DIAGNOSIS — I672 Cerebral atherosclerosis: Secondary | ICD-10-CM | POA: Diagnosis not present

## 2021-06-10 DIAGNOSIS — S065X9A Traumatic subdural hemorrhage with loss of consciousness of unspecified duration, initial encounter: Secondary | ICD-10-CM | POA: Diagnosis not present

## 2021-06-10 DIAGNOSIS — Z8673 Personal history of transient ischemic attack (TIA), and cerebral infarction without residual deficits: Secondary | ICD-10-CM | POA: Diagnosis not present

## 2021-07-05 DIAGNOSIS — I471 Supraventricular tachycardia: Secondary | ICD-10-CM | POA: Diagnosis not present

## 2021-07-05 DIAGNOSIS — E782 Mixed hyperlipidemia: Secondary | ICD-10-CM | POA: Diagnosis not present

## 2021-07-05 DIAGNOSIS — I480 Paroxysmal atrial fibrillation: Secondary | ICD-10-CM | POA: Diagnosis not present

## 2021-07-05 DIAGNOSIS — I10 Essential (primary) hypertension: Secondary | ICD-10-CM | POA: Diagnosis not present

## 2021-07-06 DIAGNOSIS — H2513 Age-related nuclear cataract, bilateral: Secondary | ICD-10-CM | POA: Diagnosis not present

## 2021-07-15 DIAGNOSIS — S065X9A Traumatic subdural hemorrhage with loss of consciousness of unspecified duration, initial encounter: Secondary | ICD-10-CM | POA: Diagnosis not present

## 2021-07-15 DIAGNOSIS — W19XXXS Unspecified fall, sequela: Secondary | ICD-10-CM | POA: Diagnosis not present

## 2021-07-15 DIAGNOSIS — R519 Headache, unspecified: Secondary | ICD-10-CM | POA: Diagnosis not present

## 2021-07-15 DIAGNOSIS — R69 Illness, unspecified: Secondary | ICD-10-CM | POA: Diagnosis not present

## 2021-07-15 DIAGNOSIS — Z7689 Persons encountering health services in other specified circumstances: Secondary | ICD-10-CM | POA: Diagnosis not present

## 2021-07-15 DIAGNOSIS — I639 Cerebral infarction, unspecified: Secondary | ICD-10-CM | POA: Diagnosis not present

## 2021-07-22 ENCOUNTER — Other Ambulatory Visit: Payer: Self-pay | Admitting: Nurse Practitioner

## 2021-07-22 DIAGNOSIS — N6001 Solitary cyst of right breast: Secondary | ICD-10-CM

## 2021-08-04 DIAGNOSIS — H2511 Age-related nuclear cataract, right eye: Secondary | ICD-10-CM | POA: Diagnosis not present

## 2021-08-09 ENCOUNTER — Encounter: Payer: Self-pay | Admitting: Ophthalmology

## 2021-08-10 ENCOUNTER — Other Ambulatory Visit: Payer: Self-pay

## 2021-08-10 ENCOUNTER — Ambulatory Visit
Admission: RE | Admit: 2021-08-10 | Discharge: 2021-08-10 | Disposition: A | Discharge status: Home / Self Care | Payer: Medicare HMO | Source: Ambulatory Visit | Attending: Nurse Practitioner | Admitting: Nurse Practitioner

## 2021-08-10 DIAGNOSIS — R922 Inconclusive mammogram: Secondary | ICD-10-CM | POA: Diagnosis not present

## 2021-08-10 DIAGNOSIS — N6001 Solitary cyst of right breast: Secondary | ICD-10-CM

## 2021-08-10 DIAGNOSIS — N6489 Other specified disorders of breast: Secondary | ICD-10-CM | POA: Diagnosis not present

## 2021-08-12 NOTE — Discharge Instructions (Signed)

## 2021-08-14 DIAGNOSIS — R195 Other fecal abnormalities: Secondary | ICD-10-CM | POA: Diagnosis not present

## 2021-08-14 DIAGNOSIS — C189 Malignant neoplasm of colon, unspecified: Secondary | ICD-10-CM | POA: Diagnosis not present

## 2021-08-14 DIAGNOSIS — N640 Fissure and fistula of nipple: Secondary | ICD-10-CM | POA: Diagnosis not present

## 2021-08-14 DIAGNOSIS — C33 Malignant neoplasm of trachea: Secondary | ICD-10-CM | POA: Diagnosis not present

## 2021-08-14 DIAGNOSIS — C342 Malignant neoplasm of middle lobe, bronchus or lung: Secondary | ICD-10-CM | POA: Diagnosis not present

## 2021-08-14 DIAGNOSIS — K5900 Constipation, unspecified: Secondary | ICD-10-CM | POA: Diagnosis not present

## 2021-08-14 DIAGNOSIS — C4370 Malignant melanoma of unspecified lower limb, including hip: Secondary | ICD-10-CM | POA: Diagnosis not present

## 2021-08-14 DIAGNOSIS — C438 Malignant melanoma of overlapping sites of skin: Secondary | ICD-10-CM | POA: Diagnosis not present

## 2021-08-14 DIAGNOSIS — D0339 Melanoma in situ of other parts of face: Secondary | ICD-10-CM | POA: Diagnosis not present

## 2021-08-14 DIAGNOSIS — R5383 Other fatigue: Secondary | ICD-10-CM | POA: Diagnosis not present

## 2021-08-16 ENCOUNTER — Ambulatory Visit
Admission: RE | Admit: 2021-08-16 | Discharge: 2021-08-16 | Disposition: A | Discharge status: Home / Self Care | Payer: Medicare HMO | Attending: Ophthalmology | Admitting: Ophthalmology

## 2021-08-16 ENCOUNTER — Ambulatory Visit: Payer: Medicare HMO | Admitting: Anesthesiology

## 2021-08-16 ENCOUNTER — Other Ambulatory Visit: Payer: Self-pay

## 2021-08-16 ENCOUNTER — Encounter
Admission: RE | Disposition: A | Discharge status: Home / Self Care | Payer: Self-pay | Source: Home / Self Care | Attending: Ophthalmology

## 2021-08-16 DIAGNOSIS — Z91040 Latex allergy status: Secondary | ICD-10-CM | POA: Insufficient documentation

## 2021-08-16 DIAGNOSIS — Z90711 Acquired absence of uterus with remaining cervical stump: Secondary | ICD-10-CM | POA: Diagnosis not present

## 2021-08-16 DIAGNOSIS — H2511 Age-related nuclear cataract, right eye: Secondary | ICD-10-CM | POA: Diagnosis not present

## 2021-08-16 DIAGNOSIS — Z884 Allergy status to anesthetic agent status: Secondary | ICD-10-CM | POA: Diagnosis not present

## 2021-08-16 DIAGNOSIS — Z9049 Acquired absence of other specified parts of digestive tract: Secondary | ICD-10-CM | POA: Insufficient documentation

## 2021-08-16 DIAGNOSIS — Z7982 Long term (current) use of aspirin: Secondary | ICD-10-CM | POA: Diagnosis not present

## 2021-08-16 DIAGNOSIS — Z8249 Family history of ischemic heart disease and other diseases of the circulatory system: Secondary | ICD-10-CM | POA: Insufficient documentation

## 2021-08-16 DIAGNOSIS — Z85118 Personal history of other malignant neoplasm of bronchus and lung: Secondary | ICD-10-CM | POA: Diagnosis not present

## 2021-08-16 DIAGNOSIS — Z85038 Personal history of other malignant neoplasm of large intestine: Secondary | ICD-10-CM | POA: Insufficient documentation

## 2021-08-16 DIAGNOSIS — E785 Hyperlipidemia, unspecified: Secondary | ICD-10-CM | POA: Diagnosis not present

## 2021-08-16 DIAGNOSIS — Z803 Family history of malignant neoplasm of breast: Secondary | ICD-10-CM | POA: Insufficient documentation

## 2021-08-16 DIAGNOSIS — Z880 Allergy status to penicillin: Secondary | ICD-10-CM | POA: Diagnosis not present

## 2021-08-16 DIAGNOSIS — I10 Essential (primary) hypertension: Secondary | ICD-10-CM | POA: Diagnosis not present

## 2021-08-16 DIAGNOSIS — Z7901 Long term (current) use of anticoagulants: Secondary | ICD-10-CM | POA: Diagnosis not present

## 2021-08-16 DIAGNOSIS — Z888 Allergy status to other drugs, medicaments and biological substances status: Secondary | ICD-10-CM | POA: Diagnosis not present

## 2021-08-16 DIAGNOSIS — Z885 Allergy status to narcotic agent status: Secondary | ICD-10-CM | POA: Insufficient documentation

## 2021-08-16 DIAGNOSIS — H25811 Combined forms of age-related cataract, right eye: Secondary | ICD-10-CM | POA: Diagnosis not present

## 2021-08-16 DIAGNOSIS — Z87891 Personal history of nicotine dependence: Secondary | ICD-10-CM | POA: Insufficient documentation

## 2021-08-16 DIAGNOSIS — Z8673 Personal history of transient ischemic attack (TIA), and cerebral infarction without residual deficits: Secondary | ICD-10-CM | POA: Diagnosis not present

## 2021-08-16 DIAGNOSIS — Z902 Acquired absence of lung [part of]: Secondary | ICD-10-CM | POA: Diagnosis not present

## 2021-08-16 DIAGNOSIS — I4891 Unspecified atrial fibrillation: Secondary | ICD-10-CM | POA: Insufficient documentation

## 2021-08-16 DIAGNOSIS — Z79899 Other long term (current) drug therapy: Secondary | ICD-10-CM | POA: Insufficient documentation

## 2021-08-16 HISTORY — PX: CATARACT EXTRACTION W/PHACO: SHX586

## 2021-08-16 SURGERY — PHACOEMULSIFICATION, CATARACT, WITH IOL INSERTION
Anesthesia: Monitor Anesthesia Care | Site: Eye | Laterality: Right

## 2021-08-16 MED ORDER — SIGHTPATH DOSE#1 SODIUM HYALURONATE 10 MG/ML IO SOLUTION
PREFILLED_SYRINGE | INTRAOCULAR | Status: DC | PRN
  Administered 2021-08-16: 0.85 mL via INTRAOCULAR

## 2021-08-16 MED ORDER — SIGHTPATH DOSE#1 BSS IO SOLN
INTRAOCULAR | Status: DC | PRN
  Administered 2021-08-16: 15 mL

## 2021-08-16 MED ORDER — MOXIFLOXACIN HCL 0.5 % OP SOLN
OPHTHALMIC | Status: DC | PRN
  Administered 2021-08-16: 0.2 mL via OPHTHALMIC

## 2021-08-16 MED ORDER — LACTATED RINGERS IV SOLN: INTRAVENOUS | Status: DC

## 2021-08-16 MED ORDER — ACETAMINOPHEN 160 MG/5ML PO SOLN: 325.0000 mg | Freq: Once | ORAL | Status: DC

## 2021-08-16 MED ORDER — SIGHTPATH DOSE#1 SODIUM HYALURONATE 23 MG/ML IO SOLUTION
PREFILLED_SYRINGE | INTRAOCULAR | Status: DC | PRN
  Administered 2021-08-16: 0.55 mL via INTRAOCULAR

## 2021-08-16 MED ORDER — MIDAZOLAM HCL 2 MG/2ML IJ SOLN
INTRAMUSCULAR | Status: DC | PRN
  Administered 2021-08-16: 1 mg via INTRAVENOUS

## 2021-08-16 MED ORDER — ACETAMINOPHEN 325 MG PO TABS: 325.0000 mg | ORAL_TABLET | Freq: Once | ORAL | Status: DC

## 2021-08-16 MED ORDER — ARMC OPHTHALMIC DILATING DROPS
1.0000 "application " | OPHTHALMIC | Status: DC | PRN
  Administered 2021-08-16 (×3): 1 via OPHTHALMIC

## 2021-08-16 MED ORDER — LIDOCAINE HCL (PF) 2 % IJ SOLN
INTRAOCULAR | Status: DC | PRN
  Administered 2021-08-16: 2 mL via INTRAOCULAR

## 2021-08-16 MED ORDER — SIGHTPATH DOSE#1 BSS IO SOLN
INTRAOCULAR | Status: DC | PRN
  Administered 2021-08-16: 83 mL via OPHTHALMIC

## 2021-08-16 MED ORDER — TETRACAINE HCL 0.5 % OP SOLN
1.0000 [drp] | OPHTHALMIC | Status: DC | PRN
  Administered 2021-08-16 (×2): 1 [drp] via OPHTHALMIC

## 2021-08-16 SURGICAL SUPPLY — 14 items
DISSECTOR HYDRO NUCLEUS 50X22 (MISCELLANEOUS) ×2 IMPLANT
GLOVE SURG GAMMEX PI TX LF 7.5 (GLOVE) ×2 IMPLANT
GLOVE SURG SYN 8.5  E (GLOVE) ×2
GLOVE SURG SYN 8.5 E (GLOVE) ×1 IMPLANT
GOWN STRL REUS W/ TWL LRG LVL3 (GOWN DISPOSABLE) ×2 IMPLANT
GOWN STRL REUS W/TWL LRG LVL3 (GOWN DISPOSABLE) ×4
LENS IOL EYHANCE TORIC II 23.5 ×2 IMPLANT
LENS IOL EYHANCE TRC 375 23.5 ×1 IMPLANT
LENS IOL EYHNC TORIC 375 23.5 ×1 IMPLANT
PACK EYE AFTER SURG (MISCELLANEOUS) ×2 IMPLANT
SYR 3ML LL SCALE MARK (SYRINGE) ×2 IMPLANT
SYR TB 1ML LUER SLIP (SYRINGE) ×2 IMPLANT
WATER STERILE IRR 250ML POUR (IV SOLUTION) ×2 IMPLANT
WIPE NON LINTING 3.25X3.25 (MISCELLANEOUS) ×2 IMPLANT

## 2021-08-16 NOTE — Anesthesia Postprocedure Evaluation (Signed)
Anesthesia Post Note  Patient: Kelly Krause  Procedure(s) Performed: CATARACT EXTRACTION PHACO AND INTRAOCULAR LENS PLACEMENT (IOC) RIGHT Eyhance Toric (Right: Eye)     Patient location during evaluation: PACU Anesthesia Type: MAC Level of consciousness: awake and alert and oriented Pain management: satisfactory to patient Vital Signs Assessment: post-procedure vital signs reviewed and stable Respiratory status: spontaneous breathing, nonlabored ventilation and respiratory function stable Cardiovascular status: blood pressure returned to baseline and stable Postop Assessment: Adequate PO intake and No signs of nausea or vomiting Anesthetic complications: no   No notable events documented.  Raliegh Ip

## 2021-08-16 NOTE — Anesthesia Procedure Notes (Signed)
Procedure Name: MAC Date/Time: 08/16/2021 9:55 AM Performed by: Cameron Ali, CRNA Pre-anesthesia Checklist: Patient identified, Emergency Drugs available, Suction available, Timeout performed and Patient being monitored Patient Re-evaluated:Patient Re-evaluated prior to induction Oxygen Delivery Method: Nasal cannula Placement Confirmation: positive ETCO2

## 2021-08-16 NOTE — Transfer of Care (Signed)
Immediate Anesthesia Transfer of Care Note  Patient: Kelly Krause  Procedure(s) Performed: CATARACT EXTRACTION PHACO AND INTRAOCULAR LENS PLACEMENT (IOC) RIGHT Eyhance Toric (Right: Eye)  Patient Location: PACU  Anesthesia Type: MAC  Level of Consciousness: awake, alert  and patient cooperative  Airway and Oxygen Therapy: Patient Spontanous Breathing and Patient connected to supplemental oxygen  Post-op Assessment: Post-op Vital signs reviewed, Patient's Cardiovascular Status Stable, Respiratory Function Stable, Patent Airway and No signs of Nausea or vomiting  Post-op Vital Signs: Reviewed and stable  Complications: No notable events documented.

## 2021-08-16 NOTE — H&P (Signed)
[facility]   Primary Care Physician:  Sallee Lange, NP Ophthalmologist: Dr. Benay Pillow  Pre-Procedure History & Physical: HPI:  Kelly Krause is a 80 y.o. female here for cataract surgery.   Past Medical History:  Diagnosis Date   Allergy    Atrial fibrillation ([place])    Cancer ([place])    esophagus/chemo and rad   Colon cancer ([place])    chemo/rad 15 yrs   Colon cancer ([place])    2 yrs ago   DDD (degenerative disc disease), cervical    Dysrhythmia    Elevated lipids    GERD (gastroesophageal reflux disease)    Hyperlipidemia    Hypertension    Lung cancer (HCC)    chemo   Lung cancer ([place])    Major depressive disorder, recurrent episode, moderate (HCC)    Persistent disorder of initiating or maintaining sleep    Personal history of chemotherapy    Personal history of radiation therapy    colon   Stroke ([place]) 06/03/2021   No Deficits   SVT (supraventricular tachycardia) ([place])     Past Surgical History:  Procedure Laterality Date   ABDOMINAL HYSTERECTOMY     ATRIAL FIBRILLATION ABLATION     BUNIONECTOMY     CAPSULOTOMY METATARSOPHALANGEAL Left 04/19/2017   Procedure: CAPSULOTOMY METATARSOPHALANGEAL;  Surgeon: Samara Deist, DPM;  Location: [place];  Service: Podiatry;  Laterality: Left;   COLECTOMY     COLONOSCOPY WITH PROPOFOL N/A 09/26/2017   Procedure: COLONOSCOPY WITH PROPOFOL;  Surgeon: Lollie Sails, MD;  [facility] END[facility]COPY;  Service: Endoscopy;  Laterality: N/A;   ESOPHAGOGASTRODUODENOSCOPY N/A 04/21/2015   Procedure: ESOPHAGOGASTRODUODENOSCOPY (EGD);  Surgeon: Lollie Sails, MD;  Location: [facility] ENDOSCOPY;  Service: Endoscopy;  Laterality: N/A;   Esophgeal cancer     HAMMER TOE SURGERY Left 04/19/2017   Procedure: HAMMER TOE CORRECTION-3RD & 4TH;  Surgeon: Samara Deist, DPM;  Location: [place];  Service: Podiatry;  Laterality: Left;   LOBECTOMY Right    METATARSAL OSTEOTOMY Left 04/19/2017   Procedure:  PHALANX OSTEOTOMY-AKIN - LEFT;  Surgeon: Samara Deist, DPM;  Location: [place];  Service: Podiatry;  Laterality: Left;   PARTIAL HYSTERECTOMY     SINUSOTOMY      Prior to Admission medications   Medication Sig Start Date End Date Taking? Authorizing Provider  amLODipine (NORVASC) 10 MG tablet TAKE ONE TABLET BY MOUTH ONCE DAILY-NEED  APPOINTMENT Patient taking differently: Take 10 mg by mouth daily. 11/01/16  Yes Juline Patch, MD  apixaban (ELIQUIS) 5 MG TABS tablet Take 1 tablet (5 mg total) by mouth 2 (two) times daily. 06/21/21 08/09/21 Yes Sreenath, Sudheer B, MD  atorvastatin (LIPITOR) 40 MG tablet Take 1 tablet (40 mg total) by mouth daily. 06/06/21 08/09/21 Yes Sreenath, Sudheer B, MD  BIOTIN PO Take 1 tablet by mouth daily.   Yes [provider]  buPROPion (WELLBUTRIN SR) 150 MG 12 hr tablet Take 1 tablet (150 mg total) by mouth 2 (two) times daily. 11/01/16  Yes Juline Patch, MD  Cholecalciferol (VITAMIN D-3) 1000 UNITS CAPS Take 1,000 Units daily by mouth.    Yes [provider]  diphenhydrAMINE (BENADRYL) 25 mg capsule Take 50-75 mg every 6 (six) hours as needed by mouth for itching.    Yes [provider]  fexofenadine (ALLEGRA) 180 MG tablet Take 180 mg by mouth at bedtime.   Yes [provider]  fluticasone (FLONASE) 50 MCG/ACT nasal spray USE ONE SPRAY(S) IN  EACH NOSTRIL ONCE DAILY Patient taking differently: Place 2 sprays into both nostrils daily. 11/01/16  Yes Juline Patch, MD  hydrochlorothiazide (MICROZIDE) 12.5 MG capsule Take 1 capsule (12.5 mg total) by mouth daily. 06/07/21 08/09/21 Yes Sreenath, Sudheer B, MD  Multiple Vitamins-Minerals (MULTIVITAMIN PO) Take 1 tablet daily by mouth.   Yes [provider]  Multiple Vitamins-Minerals (OCUVITE ADULT 50+ PO) Take 1 tablet by mouth daily.   Yes [provider]  pantoprazole (PROTONIX) 40 MG tablet Take 1 tablet (40 mg total) by mouth 2 (two) times daily.  11/01/16  Yes Juline Patch, MD  ramipril (ALTACE) 10 MG capsule TAKE ONE CAPSULE BY MOUTH ONCE DAILY. MUST SCHEDULE APPOINTMENT FOR NOVEMBER 11/01/16  Yes Juline Patch, MD  senna-docusate (SENOKOT-S) 8.6-50 MG tablet Take 2 tablets by mouth at bedtime.   Yes [provider]  sertraline (ZOLOFT) 25 MG tablet Take 1 tablet (25 mg total) by mouth daily. Patient taking differently: Take 100 mg by mouth daily. 11/01/16  Yes Juline Patch, MD  sotalol (BETAPACE) 80 MG tablet Take 1 tablet (80 mg total) by mouth 2 (two) times daily. 06/06/21 08/09/21 Yes Sreenath, Sudheer B, MD  aspirin EC 81 MG tablet Take 1 tablet (81 mg total) by mouth daily. Patient not taking: Reported on 08/09/2021 06/06/21   Sidney Ace, MD  sucralfate (CARAFATE) 1 GM/10ML suspension Take by mouth. Cancer Doc 10/20/15 07/09/19  [provider]    Allergies as of 07/20/2021 - Review Complete 06/03/2021  Allergen Reaction Noted   Amoxicillin-pot clavulanate Itching, Nausea Only, and Other (See Comments) 04/14/2015   Codeine Nausea Only and Other (See Comments) 03/27/2015   Demerol [meperidine] Itching, Nausea Only, and Other (See Comments) 03/27/2015   Latex Hives and Itching 03/27/2015   Morphine and related Itching, Nausea Only, and Other (See Comments) 04/14/2015   Oxycodone-acetaminophen Itching and Nausea Only 04/14/2015   Tramadol Other (See Comments) 04/14/2015   Buprenorphine hcl Itching, Nausea Only, and Other (See Comments) 06/16/2015   Fentanyl Itching and Nausea And Vomiting 11/04/2015   Strawberry extract Nausea And Vomiting and Rash 06/02/2015   Tapentadol Rash 04/14/2015    Family History  Problem Relation Age of Onset   Stroke Mother    Depression Mother    Breast cancer Mother 81   Breast cancer Maternal Aunt 37   Alcohol abuse Father    Stroke Father    Hypertension Father     Social History   Socioeconomic History   Marital status: Married    Spouse name: Not on  file   Number of children: Not on file   Years of education: Not on file   Highest education level: Not on file  Occupational History   Not on file  Tobacco Use   Smoking status: Former   Smokeless tobacco: Never  Vaping Use   Vaping Use: Never used  Substance and Sexual Activity   Alcohol use: Yes    Comment: 3 none last 24hrs   Drug use: No   Sexual activity: Not Currently  Other Topics Concern   Not on file  Social History Narrative   Not on file   Social Determinants of Health   Financial Resource Strain: Not on file  Food Insecurity: Not on file  Transportation Needs: Not on file  Physical Activity: Not on file  Stress: Not on file  Social Connections: Not on file  Intimate Partner Violence: Not on file    Review of Systems:  See HPI, otherwise negative ROS  Physical Exam: BP (!) 195/90   Pulse (!) 56   Temp (!) 97 F (36.1 C) (Temporal)   Resp 20   Ht 5\' 7"  (1.702 m)   Wt 80.7 kg   SpO2 97%   BMI 27.88 kg/m  General:   Alert, cooperative in NAD Head:  Normocephalic and atraumatic. Respiratory:  Normal work of breathing. Cardiovascular:  RRR  Impression/Plan: Kelly Krause is here for cataract surgery.  Risks, benefits, limitations, and alternatives regarding cataract surgery have been reviewed with the patient.  Questions have been answered.  All parties agreeable.   Benay Pillow, MD  08/16/2021, 8:57 AM

## 2021-08-16 NOTE — Anesthesia Preprocedure Evaluation (Signed)
Anesthesia Evaluation  Patient identified by MRN, date of birth, ID band Patient awake    Reviewed: Allergy & Precautions, H&P , NPO status , Patient's Chart, lab work & pertinent test results  Airway Mallampati: II  TM Distance: >3 FB Neck ROM: full    Dental no notable dental hx.    Pulmonary former smoker,  H/o mets to lung, s/p chemo   Pulmonary exam normal breath sounds clear to auscultation       Cardiovascular hypertension, + dysrhythmias Atrial Fibrillation and Supra Ventricular Tachycardia  Rhythm:regular Rate:Normal     Neuro/Psych PSYCHIATRIC DISORDERS CVA    GI/Hepatic GERD  ,  Endo/Other    Renal/GU      Musculoskeletal   Abdominal   Peds  Hematology   Anesthesia Other Findings   Reproductive/Obstetrics                             Anesthesia Physical Anesthesia Plan  ASA: 3  Anesthesia Plan: MAC   Post-op Pain Management:    Induction:   PONV Risk Score and Plan: 2 and Treatment may vary due to age or medical condition, TIVA and Midazolam  Airway Management Planned:   Additional Equipment:   Intra-op Plan:   Post-operative Plan:   Informed Consent: I have reviewed the patients History and Physical, chart, labs and discussed the procedure including the risks, benefits and alternatives for the proposed anesthesia with the patient or authorized representative who has indicated his/her understanding and acceptance.     Dental Advisory Given  Plan Discussed with: CRNA  Anesthesia Plan Comments:         Anesthesia Quick Evaluation

## 2021-08-16 NOTE — Op Note (Signed)
OPERATIVE NOTE  Kelly Krause [id number] 08/16/2021   PREOPERATIVE DIAGNOSIS:  Nuclear sclerotic cataract right eye.  H25.11   POSTOPERATIVE DIAGNOSIS:    Nuclear sclerotic cataract right eye.     PROCEDURE:  Phacoemusification with posterior chamber intraocular lens placement of the right eye   LENS:   Implant Name Type Inv. Item Serial No. Manufacturer Lot No. LRB No. Used Action  Eyhance Toric II IOL   [id number] Wynetta Emery AND JOHNSON  Right 1 Implanted       Procedure(s) with comments: CATARACT EXTRACTION PHACO AND INTRAOCULAR LENS PLACEMENT (IOC) RIGHT Eyhance Toric (Right) - 5.02 00:33.8  [id number] +23.5   ULTRASOUND TIME: 0 minutes 33 seconds.  CDE 5.02   SURGEON:  Benay Pillow, MD, MPH  ANESTHESIOLOGIST: Anesthesiologist: Ronelle Nigh, MD CRNA: Cameron Ali, CRNA   ANESTHESIA:  Topical with tetracaine drops augmented with 1% preservative-free intracameral lidocaine.  ESTIMATED BLOOD LOSS: less than 1 mL.   COMPLICATIONS:  None.   DESCRIPTION OF PROCEDURE:  The patient was identified in the holding room and transported to the operating room and placed in the supine position under the operating microscope.  The right eye was identified as the operative eye and it was prepped and draped in the usual sterile ophthalmic fashion.  The verion system was registered without difficulty.   A 1.0 millimeter clear-corneal paracentesis was made at the 10:30 position. 0.5 ml of preservative-free 1% lidocaine with epinephrine was injected into the anterior chamber.  The anterior chamber was filled with Healon 5 viscoelastic.  A 2.4 millimeter keratome was used to make a near-clear corneal incision at the 8:00 position.  A curvilinear capsulorrhexis was made with a cystotome and capsulorrhexis forceps.  Balanced salt solution was used to hydrodissect and hydrodelineate the nucleus.   Phacoemulsification was then used in stop and chop fashion to remove the lens nucleus and epinucleus.   The remaining cortex was then removed using the irrigation and aspiration handpiece. Healon was then placed into the capsular bag to distend it for lens placement.  A lens was then injected into the capsular bag.  The remaining viscoelastic was aspirated.  The lens was rotated with guidance from the [place] system.   Wounds were hydrated with balanced salt solution.  The anterior chamber was inflated to a physiologic pressure with balanced salt solution. The lens was well centered.  Intracameral vigamox 0.1 mL undiluted was injected into the eye and a drop placed onto the ocular surface.  No wound leaks were noted.  The patient was taken to the recovery room in stable condition without complications of anesthesia or surgery  Benay Pillow 08/16/2021, 10:19 AM

## 2021-08-17 ENCOUNTER — Encounter: Payer: Self-pay | Admitting: Ophthalmology

## 2021-08-17 DIAGNOSIS — H2512 Age-related nuclear cataract, left eye: Secondary | ICD-10-CM | POA: Diagnosis not present

## 2021-08-19 ENCOUNTER — Encounter: Payer: Self-pay | Admitting: Ophthalmology

## 2021-08-26 NOTE — Discharge Instructions (Signed)

## 2021-08-30 ENCOUNTER — Encounter
Admission: RE | Disposition: A | Discharge status: Home / Self Care | Payer: Self-pay | Source: Home / Self Care | Attending: Ophthalmology

## 2021-08-30 ENCOUNTER — Encounter: Payer: Self-pay | Admitting: Ophthalmology

## 2021-08-30 ENCOUNTER — Other Ambulatory Visit: Payer: Self-pay

## 2021-08-30 ENCOUNTER — Ambulatory Visit
Admission: RE | Admit: 2021-08-30 | Discharge: 2021-08-30 | Disposition: A | Discharge status: Home / Self Care | Payer: Medicare HMO | Attending: Ophthalmology | Admitting: Ophthalmology

## 2021-08-30 ENCOUNTER — Ambulatory Visit: Payer: Medicare HMO | Admitting: Anesthesiology

## 2021-08-30 DIAGNOSIS — Z8673 Personal history of transient ischemic attack (TIA), and cerebral infarction without residual deficits: Secondary | ICD-10-CM | POA: Diagnosis not present

## 2021-08-30 DIAGNOSIS — Z885 Allergy status to narcotic agent status: Secondary | ICD-10-CM | POA: Diagnosis not present

## 2021-08-30 DIAGNOSIS — H25812 Combined forms of age-related cataract, left eye: Secondary | ICD-10-CM | POA: Diagnosis not present

## 2021-08-30 DIAGNOSIS — Z7982 Long term (current) use of aspirin: Secondary | ICD-10-CM | POA: Insufficient documentation

## 2021-08-30 DIAGNOSIS — Z87891 Personal history of nicotine dependence: Secondary | ICD-10-CM | POA: Insufficient documentation

## 2021-08-30 DIAGNOSIS — Z91040 Latex allergy status: Secondary | ICD-10-CM | POA: Insufficient documentation

## 2021-08-30 DIAGNOSIS — Z880 Allergy status to penicillin: Secondary | ICD-10-CM | POA: Diagnosis not present

## 2021-08-30 DIAGNOSIS — Z79899 Other long term (current) drug therapy: Secondary | ICD-10-CM | POA: Diagnosis not present

## 2021-08-30 DIAGNOSIS — Z85118 Personal history of other malignant neoplasm of bronchus and lung: Secondary | ICD-10-CM | POA: Insufficient documentation

## 2021-08-30 DIAGNOSIS — Z85038 Personal history of other malignant neoplasm of large intestine: Secondary | ICD-10-CM | POA: Insufficient documentation

## 2021-08-30 DIAGNOSIS — H2512 Age-related nuclear cataract, left eye: Secondary | ICD-10-CM | POA: Diagnosis not present

## 2021-08-30 DIAGNOSIS — Z7901 Long term (current) use of anticoagulants: Secondary | ICD-10-CM | POA: Diagnosis not present

## 2021-08-30 HISTORY — PX: CATARACT EXTRACTION W/PHACO: SHX586

## 2021-08-30 SURGERY — PHACOEMULSIFICATION, CATARACT, WITH IOL INSERTION
Anesthesia: Monitor Anesthesia Care | Site: Eye | Laterality: Left

## 2021-08-30 MED ORDER — MIDAZOLAM HCL 2 MG/2ML IJ SOLN
INTRAMUSCULAR | Status: DC | PRN
  Administered 2021-08-30: 1.5 mg via INTRAVENOUS

## 2021-08-30 MED ORDER — SIGHTPATH DOSE#1 BSS IO SOLN
INTRAOCULAR | Status: DC | PRN
  Administered 2021-08-30: 15 mL

## 2021-08-30 MED ORDER — SIGHTPATH DOSE#1 SODIUM HYALURONATE 23 MG/ML IO SOLUTION
PREFILLED_SYRINGE | INTRAOCULAR | Status: DC | PRN
  Administered 2021-08-30: .6 mL via INTRAOCULAR

## 2021-08-30 MED ORDER — MOXIFLOXACIN HCL 0.5 % OP SOLN
OPHTHALMIC | Status: DC | PRN
  Administered 2021-08-30: 0.2 mL via OPHTHALMIC

## 2021-08-30 MED ORDER — TETRACAINE HCL 0.5 % OP SOLN
1.0000 [drp] | OPHTHALMIC | Status: DC | PRN
  Administered 2021-08-30 (×3): 1 [drp] via OPHTHALMIC

## 2021-08-30 MED ORDER — LACTATED RINGERS IV SOLN: INTRAVENOUS | Status: DC

## 2021-08-30 MED ORDER — SIGHTPATH DOSE#1 SODIUM HYALURONATE 10 MG/ML IO SOLUTION
PREFILLED_SYRINGE | INTRAOCULAR | Status: DC | PRN
  Administered 2021-08-30: 0.55 mL via INTRAOCULAR

## 2021-08-30 MED ORDER — SIGHTPATH DOSE#1 BSS IO SOLN
INTRAOCULAR | Status: DC | PRN
  Administered 2021-08-30: 86 mL via OPHTHALMIC

## 2021-08-30 MED ORDER — ARMC OPHTHALMIC DILATING DROPS
1.0000 "application " | OPHTHALMIC | Status: DC | PRN
  Administered 2021-08-30 (×3): 1 via OPHTHALMIC

## 2021-08-30 MED ORDER — LIDOCAINE HCL (PF) 2 % IJ SOLN
INTRAOCULAR | Status: DC | PRN
  Administered 2021-08-30: 1 mL via INTRAOCULAR

## 2021-08-30 SURGICAL SUPPLY — 14 items
CANNULA ANT/CHMB 27GA (MISCELLANEOUS) IMPLANT
DISSECTOR HYDRO NUCLEUS 50X22 (MISCELLANEOUS) ×2 IMPLANT
GLOVE SURG GAMMEX PI TX LF 7.5 (GLOVE) ×2 IMPLANT
GLOVE SURG SYN 8.5  E (GLOVE) ×2
GLOVE SURG SYN 8.5 E (GLOVE) ×1 IMPLANT
GOWN STRL REUS W/ TWL LRG LVL3 (GOWN DISPOSABLE) ×2 IMPLANT
GOWN STRL REUS W/TWL LRG LVL3 (GOWN DISPOSABLE) ×4
LENS IOL TECNIS EYHANCE 23.5 (Intraocular Lens) ×2 IMPLANT
MARKER SKIN DUAL TIP RULER LAB (MISCELLANEOUS) IMPLANT
PACK EYE AFTER SURG (MISCELLANEOUS) ×2 IMPLANT
SYR 3ML LL SCALE MARK (SYRINGE) ×2 IMPLANT
SYR TB 1ML LUER SLIP (SYRINGE) ×2 IMPLANT
WATER STERILE IRR 250ML POUR (IV SOLUTION) ×2 IMPLANT
WIPE NON LINTING 3.25X3.25 (MISCELLANEOUS) ×2 IMPLANT

## 2021-08-30 NOTE — Transfer of Care (Signed)
Immediate Anesthesia Transfer of Care Note  Patient: Margel Joens  Procedure(s) Performed: CATARACT EXTRACTION PHACO AND INTRAOCULAR LENS PLACEMENT (IOC) LEFT 5.10 00:43.4 (Left: Eye)  Patient Location: PACU  Anesthesia Type: MAC  Level of Consciousness: awake, alert  and patient cooperative  Airway and Oxygen Therapy: Patient Spontanous Breathing and Patient connected to supplemental oxygen  Post-op Assessment: Post-op Vital signs reviewed, Patient's Cardiovascular Status Stable, Respiratory Function Stable, Patent Airway and No signs of Nausea or vomiting  Post-op Vital Signs: Reviewed and stable  Complications: No notable events documented.

## 2021-08-30 NOTE — H&P (Signed)
[facility]   Primary Care Physician:  Sallee Lange, NP Ophthalmologist: Dr. Benay Pillow  Pre-Procedure History & Physical: HPI:  Kelly Krause is a 80 y.o. female here for cataract surgery.   Past Medical History:  Diagnosis Date   Allergy    Atrial fibrillation ([place])    Cancer ([place])    esophagus/chemo and rad   Colon cancer ([place])    chemo/rad 15 yrs   Colon cancer ([place])    2 yrs ago   DDD (degenerative disc disease), cervical    Dysrhythmia    Elevated lipids    GERD (gastroesophageal reflux disease)    Hyperlipidemia    Hypertension    Lung cancer (H[facility])    chemo   Lung cancer ([place])    Major depressive disorder, recurrent episode, moderate (HCC)    Persistent disorder of initiating or maintaining sleep    Personal history of chemotherapy    Personal history of radiation therapy    colon   Stroke ([place]) 06/03/2021   No Deficits   SVT (supraventricular tachycardia) ([place])     Past Surgical History:  Procedure Laterality Date   ABDOMINAL HYSTERECTOMY     ATRIAL FIBRILLATION ABLATION     BUNIONECTOMY     CAPSULOTOMY METATARSOPHALANGEAL Left 04/19/2017   Procedure: CAPSULOTOMY METATARSOPHALANGEAL;  Surgeon: Samara Deist, DPM;  Location: [place];  Service: Podiatry;  Laterality: Left;   CATARACT EXTRACTION W/PHACO Right 08/16/2021   Procedure: CATARACT EXTRACTION PHACO AND INTRAOCULAR LENS PLACEMENT (IOC) RIGHT Eyhance Toric;  Surgeon: Eulogio Bear, MD;  Location: [place];  Service: Ophthalmology;  Laterality: Right;  5.02 00:33.8   COLECTOMY     COLONOSCOPY WITH PROPOFOL N/A 09/26/2017   Procedure: COLONOSCOPY WITH PROPOFOL;  Surgeon: Lollie Sails, MD;  Location[facility]COPY;  Service: Endoscopy;  Laterality: N/A;   ESOPHAGOGASTRODUODENOSCOPY N/A 04/21/2015   Procedure: ESOPHAGOGASTRODUODENOSCOPY (EGD);  Surgeon: Lollie Sails, MD;  Location: [facility] ENDOSCOPY;  Service: Endoscopy;  Laterality: N/A;   Esophgeal  cancer     HAMMER TOE SURGERY Left 04/19/2017   Procedure: HAMMER TOE CORRECTION-3RD & 4TH;  Surgeon: Samara Deist, DPM;  Location: [place];  Service: Podiatry;  Laterality: Left;   LOBECTOMY Right    METATARSAL OSTEOTOMY Left 04/19/2017   Procedure: PHALANX OSTEOTOMY-AKIN - LEFT;  Surgeon: Samara Deist, DPM;  Location: [place];  Service: Podiatry;  Laterality: Left;   PARTIAL HYSTERECTOMY     SINUSOTOMY      Prior to Admission medications   Medication Sig Start Date End Date Taking? Authorizing Provider  amLODipine (NORVASC) 10 MG tablet TAKE ONE TABLET BY MOUTH ONCE DAILY-NEED  APPOINTMENT Patient taking differently: Take 10 mg by mouth daily. 11/01/16  Yes Juline Patch, MD  apixaban (ELIQUIS) 5 MG TABS tablet Take 1 tablet (5 mg total) by mouth 2 (two) times daily. 06/21/21 08/30/21 Yes Sreenath, Sudheer B, MD  aspirin EC 81 MG tablet Take 1 tablet (81 mg total) by mouth daily. 06/06/21  Yes Sreenath, Sudheer B, MD  atorvastatin (LIPITOR) 40 MG tablet Take 1 tablet (40 mg total) by mouth daily. 06/06/21 08/30/21 Yes Sreenath, Sudheer B, MD  BIOTIN PO Take 1 tablet by mouth daily.   Yes [provider]  buPROPion (WELLBUTRIN SR) 150 MG 12 hr tablet Take 1 tablet (150 mg total) by mouth 2 (two) times daily. 11/01/16  Yes Juline Patch, MD  Cholecalciferol (VITAMIN D-3) 1000 UNITS CAPS Take 1,000 Units daily by mouth.  Yes [provider]  diphenhydrAMINE (BENADRYL) 25 mg capsule Take 50-75 mg every 6 (six) hours as needed by mouth for itching.    Yes [provider]  fexofenadine (ALLEGRA) 180 MG tablet Take 180 mg by mouth at bedtime.   Yes [provider]  hydrochlorothiazide (MICROZIDE) 12.5 MG capsule Take 1 capsule (12.5 mg total) by mouth daily. 06/07/21 08/30/21 Yes Sreenath, Sudheer B, MD  Multiple Vitamins-Minerals (MULTIVITAMIN PO) Take 1 tablet daily by mouth.   Yes [provider]  Multiple Vitamins-Minerals  (OCUVITE ADULT 50+ PO) Take 1 tablet by mouth daily.   Yes [provider]  pantoprazole (PROTONIX) 40 MG tablet Take 1 tablet (40 mg total) by mouth 2 (two) times daily. 11/01/16  Yes Juline Patch, MD  ramipril (ALTACE) 10 MG capsule TAKE ONE CAPSULE BY MOUTH ONCE DAILY. MUST SCHEDULE APPOINTMENT FOR NOVEMBER 11/01/16  Yes Juline Patch, MD  senna-docusate (SENOKOT-S) 8.6-50 MG tablet Take 2 tablets by mouth at bedtime.   Yes [provider]  sertraline (ZOLOFT) 25 MG tablet Take 1 tablet (25 mg total) by mouth daily. Patient taking differently: Take 100 mg by mouth daily. 11/01/16  Yes Juline Patch, MD  sotalol (BETAPACE) 80 MG tablet Take 1 tablet (80 mg total) by mouth 2 (two) times daily. 06/06/21 08/30/21 Yes Sreenath, Sudheer B, MD  fluticasone (FLONASE) 50 MCG/ACT nasal spray USE ONE SPRAY(S) IN EACH NOSTRIL ONCE DAILY Patient taking differently: Place 2 sprays into both nostrils daily. 11/01/16   Juline Patch, MD  sucralfate (CARAFATE) 1 GM/10ML suspension Take by mouth. Cancer Doc 10/20/15 07/09/19  [provider]    Allergies as of 07/20/2021 - Review Complete 06/03/2021  Allergen Reaction Noted   Amoxicillin-pot clavulanate Itching, Nausea Only, and Other (See Comments) 04/14/2015   Codeine Nausea Only and Other (See Comments) 03/27/2015   Demerol [meperidine] Itching, Nausea Only, and Other (See Comments) 03/27/2015   Latex Hives and Itching 03/27/2015   Morphine and related Itching, Nausea Only, and Other (See Comments) 04/14/2015   Oxycodone-acetaminophen Itching and Nausea Only 04/14/2015   Tramadol Other (See Comments) 04/14/2015   Buprenorphine hcl Itching, Nausea Only, and Other (See Comments) 06/16/2015   Fentanyl Itching and Nausea And Vomiting 11/04/2015   Strawberry extract Nausea And Vomiting and Rash 06/02/2015   Tapentadol Rash 04/14/2015    Family History  Problem Relation Age of Onset   Stroke Mother    Depression Mother     Breast cancer Mother 70   Breast cancer Maternal Aunt 11   Alcohol abuse Father    Stroke Father    Hypertension Father     Social History   Socioeconomic History   Marital status: Married    Spouse name: Not on file   Number of children: Not on file   Years of education: Not on file   Highest education level: Not on file  Occupational History   Not on file  Tobacco Use   Smoking status: Former   Smokeless tobacco: Never  Vaping Use   Vaping Use: Never used  Substance and Sexual Activity   Alcohol use: Yes    Comment: 3 none last 24hrs   Drug use: No   Sexual activity: Not Currently  Other Topics Concern   Not on file  Social History Narrative   Not on file   Social Determinants of Health   Financial Resource Strain: Not on file  Food Insecurity: Not on file  Transportation Needs: Not  on file  Physical Activity: Not on file  Stress: Not on file  Social Connections: Not on file  Intimate Partner Violence: Not on file    Review of Systems: See HPI, otherwise negative ROS  Physical Exam: BP (!) 199/74   Pulse (!) 59   Temp 97.9 F (36.6 C) (Temporal)   Ht 5\' 7"  (1.702 m)   Wt 82.6 kg   SpO2 97%   BMI 28.51 kg/m  General:   Alert, cooperative in NAD Head:  Normocephalic and atraumatic. Respiratory:  Normal work of breathing. Cardiovascular:  RRR  Impression/Plan: Kelly Krause is here for cataract surgery.  Risks, benefits, limitations, and alternatives regarding cataract surgery have been reviewed with the patient.  Questions have been answered.  All parties agreeable.   Benay Pillow, MD  08/30/2021, 11:06 AM

## 2021-08-30 NOTE — Op Note (Signed)
OPERATIVE NOTE  Kelly Krause [id number] 08/30/2021   PREOPERATIVE DIAGNOSIS:  Nuclear sclerotic cataract left eye.  H25.12   POSTOPERATIVE DIAGNOSIS:    Nuclear sclerotic cataract left eye.     PROCEDURE:  Phacoemusification with posterior chamber intraocular lens placement of the left eye   LENS:   Implant Name Type Inv. Item Serial No. Manufacturer Lot No. LRB No. Used Action  LENS IOL TECNIS EYHANCE 23.5 - [id number] Intraocular Lens LENS IOL TECNIS EYHANCE 23.5 [id number] JOHNSON   Left 1 Implanted      Procedure(s): CATARACT EXTRACTION PHACO AND INTRAOCULAR LENS PLACEMENT (IOC) LEFT 5.10 00:43.4 (Left)  DIB00 +23.5   ULTRASOUND TIME: 0 minutes 43 seconds.  CDE 5.10   SURGEON:  Benay Pillow, MD, MPH   ANESTHESIA:  Topical with tetracaine drops augmented with 1% preservative-free intracameral lidocaine.  ESTIMATED BLOOD LOSS: <1 mL   COMPLICATIONS:  None.   DESCRIPTION OF PROCEDURE:  The patient was identified in the holding room and transported to the operating room and placed in the supine position under the operating microscope.  The left eye was identified as the operative eye and it was prepped and draped in the usual sterile ophthalmic fashion.   A 1.0 millimeter clear-corneal paracentesis was made at the 5:00 position. 0.5 ml of preservative-free 1% lidocaine with epinephrine was injected into the anterior chamber.  The anterior chamber was filled with Healon 5 viscoelastic.  A 2.4 millimeter keratome was used to make a near-clear corneal incision at the 2:00 position.  A curvilinear capsulorrhexis was made with a cystotome and capsulorrhexis forceps.  Balanced salt solution was used to hydrodissect and hydrodelineate the nucleus.   Phacoemulsification was then used in stop and chop fashion to remove the lens nucleus and epinucleus.  The remaining cortex was then removed using the irrigation and aspiration handpiece. Healon was then placed into the capsular bag to  distend it for lens placement.  A lens was then injected into the capsular bag.  The remaining viscoelastic was aspirated.   Wounds were hydrated with balanced salt solution.  The anterior chamber was inflated to a physiologic pressure with balanced salt solution.  Intracameral vigamox 0.1 mL undiltued was injected into the eye and a drop placed onto the ocular surface.  No wound leaks were noted.  The patient was taken to the recovery room in stable condition without complications of anesthesia or surgery  Benay Pillow 08/30/2021, 11:34 AM

## 2021-08-30 NOTE — Anesthesia Postprocedure Evaluation (Signed)
Anesthesia Post Note  Patient: Kelly Krause  Procedure(s) Performed: CATARACT EXTRACTION PHACO AND INTRAOCULAR LENS PLACEMENT (IOC) LEFT 5.10 00:43.4 (Left: Eye)     Patient location during evaluation: PACU Anesthesia Type: MAC Level of consciousness: awake and alert and oriented Pain management: satisfactory to patient Vital Signs Assessment: post-procedure vital signs reviewed and stable Respiratory status: spontaneous breathing, nonlabored ventilation and respiratory function stable Cardiovascular status: blood pressure returned to baseline and stable Postop Assessment: Adequate PO intake and No signs of nausea or vomiting Anesthetic complications: no   No notable events documented.  Raliegh Ip

## 2021-08-30 NOTE — Anesthesia Preprocedure Evaluation (Signed)
Anesthesia Evaluation  Patient identified by MRN, date of birth, ID band Patient awake    Reviewed: Allergy & Precautions, H&P , NPO status , Patient's Chart, lab work & pertinent test results  Airway Mallampati: II  TM Distance: >3 FB Neck ROM: full    Dental no notable dental hx.    Pulmonary former smoker,  H/o mets to lung, s/p chemo   Pulmonary exam normal breath sounds clear to auscultation       Cardiovascular hypertension, + dysrhythmias Atrial Fibrillation and Supra Ventricular Tachycardia  Rhythm:regular Rate:Normal     Neuro/Psych PSYCHIATRIC DISORDERS CVA    GI/Hepatic GERD  ,  Endo/Other    Renal/GU      Musculoskeletal   Abdominal   Peds  Hematology   Anesthesia Other Findings   Reproductive/Obstetrics                             Anesthesia Physical  Anesthesia Plan  ASA: 3  Anesthesia Plan: MAC   Post-op Pain Management:    Induction:   PONV Risk Score and Plan: 2 and Treatment may vary due to age or medical condition, TIVA and Midazolam  Airway Management Planned:   Additional Equipment:   Intra-op Plan:   Post-operative Plan:   Informed Consent: I have reviewed the patients History and Physical, chart, labs and discussed the procedure including the risks, benefits and alternatives for the proposed anesthesia with the patient or authorized representative who has indicated his/her understanding and acceptance.     Dental Advisory Given  Plan Discussed with: CRNA  Anesthesia Plan Comments:         Anesthesia Quick Evaluation

## 2021-08-31 ENCOUNTER — Encounter: Payer: Self-pay | Admitting: Ophthalmology

## 2021-10-21 DIAGNOSIS — H02834 Dermatochalasis of left upper eyelid: Secondary | ICD-10-CM | POA: Diagnosis not present

## 2021-10-21 DIAGNOSIS — H02831 Dermatochalasis of right upper eyelid: Secondary | ICD-10-CM | POA: Diagnosis not present

## 2021-10-29 DIAGNOSIS — H02834 Dermatochalasis of left upper eyelid: Secondary | ICD-10-CM | POA: Diagnosis not present

## 2021-10-29 DIAGNOSIS — H02831 Dermatochalasis of right upper eyelid: Secondary | ICD-10-CM | POA: Diagnosis not present

## 2021-11-05 ENCOUNTER — Encounter: Payer: Self-pay | Admitting: Emergency Medicine

## 2021-11-05 ENCOUNTER — Other Ambulatory Visit: Payer: Self-pay

## 2021-11-05 ENCOUNTER — Ambulatory Visit
Admission: EM | Admit: 2021-11-05 | Discharge: 2021-11-05 | Disposition: A | Discharge status: Home / Self Care | Payer: Medicare HMO | Attending: Medical Oncology | Admitting: Medical Oncology

## 2021-11-05 DIAGNOSIS — N3001 Acute cystitis with hematuria: Secondary | ICD-10-CM | POA: Diagnosis not present

## 2021-11-05 LAB — URINALYSIS, COMPLETE (UACMP) WITH MICROSCOPIC
Bilirubin Urine: NEGATIVE
Glucose, UA: NEGATIVE mg/dL
Ketones, ur: NEGATIVE mg/dL
Nitrite: POSITIVE — AB
Protein, ur: 100 mg/dL — AB
Specific Gravity, Urine: 1.015 (ref 1.005–1.030)
WBC, UA: >50 WBC/hpf (ref 0–5)
pH: 6 (ref 5.0–8.0)

## 2021-11-05 MED ORDER — NITROFURANTOIN MONOHYD MACRO 100 MG PO CAPS: 100.0000 mg | ORAL_CAPSULE | Freq: Two times a day (BID) | ORAL | 0 refills | Status: DC

## 2021-11-05 MED ORDER — PHENAZOPYRIDINE HCL 200 MG PO TABS: 200.0000 mg | ORAL_TABLET | Freq: Three times a day (TID) | ORAL | 0 refills | Status: DC

## 2021-11-05 NOTE — Discharge Instructions (Signed)

## 2021-11-05 NOTE — ED Triage Notes (Signed)
Patient c/o burning when urinating and bladder pressure that started 5-6 days ago.  Patient reports blood in her urine.

## 2021-11-05 NOTE — ED Provider Notes (Signed)
[facility]    CSN: [id number] Arrival date & time: 11/05/21  1205      History   Chief Complaint Chief Complaint  Patient presents with   Dysuria    HPI Kelly Krause is a 80 y.o. female.   HPI  80 year old female here for evaluation of urinary complaints.  Patient reports that the last 5 to 6 days she was having pain with urination and increased bladder pressure.  There is ago she started to notice blood in her urine.  She is also complaining of some low back pain.  She denies fever, abdominal pain, nausea, or vomiting.  Past Medical History:  Diagnosis Date   Allergy    Atrial fibrillation ([place])    Cancer ([place])    esophagus/chemo and rad   Colon cancer ([place])    chemo/rad 15 yrs   Colon cancer ([place])    2 yrs ago   DDD (degenerative disc disease), cervical    Dysrhythmia    Elevated lipids    GERD (gastroesophageal reflux disease)    Hyperlipidemia    Hypertension    Lung cancer ([place])    chemo   Lung cancer ([place])    Major depressive disorder, recurrent episode, moderate (HCC)    Persistent disorder of initiating or maintaining sleep    Personal history of chemotherapy    Personal history of radiation therapy    colon   Stroke ([place]) 06/03/2021   No Deficits   SVT (supraventricular tachycardia) ([place])     Patient Active Problem List   Diagnosis Date Noted   Acute CVA (cerebrovascular accident) ([place]) 06/04/2021   Stroke ([place]) 06/03/2021   Hypertensive urgency 06/03/2021   Sinus bradycardia 06/03/2021   Cancer of cecum ([place]) 10/04/2017   Normocytic anemia 10/04/2017   A-fib ([place]) 06/16/2015   Cancer of colon ([place]) 06/16/2015   DD (diverticular disease) 06/16/2015   Esophageal ulcer 06/16/2015   Hallux abducto valgus 06/16/2015   Hammer toe 06/16/2015   H/O varicella 06/16/2015   HK (hyperkeratosis) 06/16/2015   BP (high blood pressure) 06/16/2015   Neuritis or radiculitis due to rupture of lumbar intervertebral disc 06/16/2015    Cancer of lung ([place]) 06/16/2015   Degenerative arthritis of spine 06/16/2015   SCC (squamous cell carcinoma of esophagus) ([place]) 06/16/2015   Supraventricular tachycardia ([place]) 06/16/2015   Temporary cerebral vascular dysfunction 06/16/2015   Familial multiple lipoprotein-type hyperlipidemia 03/27/2015   Clinical depression 03/27/2015   Acid reflux 03/27/2015   CN (constipation) 03/27/2015   Ulcerative cystitis 03/27/2015   Routine general medical examination at a health care facility 03/27/2015   Allergic rhinitis 03/27/2015   H/O malignant neoplasm 03/27/2015   Contusion of knee and lower leg 03/27/2015   Narrowing of intervertebral disc space 03/27/2015   DDD (degenerative disc disease), cervical 03/27/2015   DDD (degenerative disc disease), lumbar 03/27/2015   H/O: depression 03/27/2015   Can't get food down 03/27/2015   Essential (primary) hypertension 03/27/2015   Cephalalgia 03/27/2015   Discitis of lumbar region 03/27/2015   Blood in the urine 03/27/2015   H/O: HTN (hypertension) 03/27/2015   Menopause 03/27/2015   Screening for depression 03/27/2015   Sinus infection 03/27/2015   FOM (frequency of micturition) 03/27/2015   Arrhythmia, ventricular 03/27/2015   Encounter for mammogram to establish baseline mammogram 03/27/2015   Atrial tachycardia ([place]) 03/27/2015   Lumbar and sacral osteoarthritis 11/03/2014   Atrial fibrillation ([place]) 09/18/2014   Cancer of esophagus ([place]) 06/25/2014   Malignant neoplasm of  esophagus ([place]) 06/25/2014    Past Surgical History:  Procedure Laterality Date   ABDOMINAL HYSTERECTOMY     ATRIAL FIBRILLATION ABLATION     BUNIONECTOMY     CAPSULOTOMY METATARSOPHALANGEAL Left 04/19/2017   Procedure: CAPSULOTOMY METATARSOPHALANGEAL;  Surgeon: Samara Deist, DPM;  Location: [place];  Service: Podiatry;  Laterality: Left;   CATARACT EXTRACTION W/PHACO Right 08/16/2021   Procedure: CATARACT EXTRACTION PHACO AND INTRAOCULAR LENS  PLACEMENT (IOC) RIGHT Eyhance Toric;  Surgeon: Eulogio Bear, MD;  Location: [place];  Service: Ophthalmology;  Laterality: Right;  5.02 00:33.8   CATARACT EXTRACTION W/PHACO Left 08/30/2021   Procedure: CATARACT EXTRACTION PHACO AND INTRAOCULAR LENS PLACEMENT (IOC) LEFT 5.10 00:43.4;  Surgeon: Eulogio Bear, MD;  Location: [place];  Service: Ophthalmology;  Laterality: Left;   COLECTOMY     COLONOSCOPY WITH PROPOFOL N/A 09/26/2017   Procedure: COLONOSCOPY WITH PROPOFOL;  Surgeon: Lollie Sails, MD;  Location[facility]COPY;  Service: Endoscopy;  Laterality: N/A;   ESOPHAGOGASTRODUODENOSCOPY N/A 04/21/2015   Procedure: ESOPHAGOGASTRODUODENOSCOPY (EGD);  Surgeon: Lollie Sails, MD;  Location: [facility] ENDOSCOPY;  Service: Endoscopy;  Laterality: N/A;   Esophgeal cancer     HAMMER TOE SURGERY Left 04/19/2017   Procedure: HAMMER TOE CORRECTION-3RD & 4TH;  Surgeon: Samara Deist, DPM;  Location: [place];  Service: Podiatry;  Laterality: Left;   LOBECTOMY Right    METATARSAL OSTEOTOMY Left 04/19/2017   Procedure: PHALANX OSTEOTOMY-AKIN - LEFT;  Surgeon: Samara Deist, DPM;  Location: [place];  Service: Podiatry;  Laterality: Left;   PARTIAL HYSTERECTOMY     SINUSOTOMY      OB History   No obstetric history on file.      Home Medications    Prior to Admission medications   Medication Sig Start Date End Date Taking? Authorizing Provider  amLODipine (NORVASC) 10 MG tablet TAKE ONE TABLET BY MOUTH ONCE DAILY-NEED  APPOINTMENT Patient taking differently: Take 10 mg by mouth daily. 11/01/16  Yes Juline Patch, MD  apixaban (ELIQUIS) 5 MG TABS tablet Take 1 tablet (5 mg total) by mouth 2 (two) times daily. 06/21/21 11/05/21 Yes Sreenath, Sudheer B, MD  atorvastatin (LIPITOR) 40 MG tablet Take 1 tablet (40 mg total) by mouth daily. 06/06/21 11/05/21 Yes Sreenath, Sudheer B, MD  BIOTIN PO Take 1 tablet by mouth daily.   Yes [provider]  buPROPion (WELLBUTRIN SR) 150 MG 12 hr tablet Take 1 tablet (150 mg total) by mouth 2 (two) times daily. 11/01/16  Yes Juline Patch, MD  Cholecalciferol (VITAMIN D-3) 1000 UNITS CAPS Take 1,000 Units daily by mouth.    Yes [provider]  fexofenadine (ALLEGRA) 180 MG tablet Take 180 mg by mouth at bedtime.   Yes [provider]  fluticasone (FLONASE) 50 MCG/ACT nasal spray USE ONE SPRAY(S) IN EACH NOSTRIL ONCE DAILY Patient taking differently: Place 2 sprays into both nostrils daily. 11/01/16  Yes Juline Patch, MD  hydrochlorothiazide (MICROZIDE) 12.5 MG capsule Take 1 capsule (12.5 mg total) by mouth daily. 06/07/21 11/05/21 Yes Sreenath, Sudheer B, MD  Multiple Vitamins-Minerals (OCUVITE ADULT 50+ PO) Take 1 tablet by mouth daily.   Yes [provider]  nitrofurantoin, macrocrystal-monohydrate, (MACROBID) 100 MG capsule Take 1 capsule (100 mg total) by mouth 2 (two) times daily. 11/05/21  Yes Margarette Canada, NP  pantoprazole (PROTONIX) 40 MG tablet Take 1 tablet (40 mg total) by mouth 2 (two) times daily. 11/01/16  Yes Otilio Miu  C, MD  phenazopyridine (PYRIDIUM) 200 MG tablet Take 1 tablet (200 mg total) by mouth 3 (three) times daily. 11/05/21  Yes Margarette Canada, NP  ramipril (ALTACE) 10 MG capsule TAKE ONE CAPSULE BY MOUTH ONCE DAILY. MUST SCHEDULE APPOINTMENT FOR NOVEMBER 11/01/16  Yes Juline Patch, MD  senna-docusate (SENOKOT-S) 8.6-50 MG tablet Take 2 tablets by mouth at bedtime.   Yes [provider]  diphenhydrAMINE (BENADRYL) 25 mg capsule Take 50-75 mg every 6 (six) hours as needed by mouth for itching.     [provider]  Multiple Vitamins-Minerals (MULTIVITAMIN PO) Take 1 tablet daily by mouth.    [provider]  sertraline (ZOLOFT) 25 MG tablet Take 1 tablet (25 mg total) by mouth daily. Patient taking differently: Take 100 mg by mouth daily. 11/01/16   Juline Patch, MD  sotalol (BETAPACE) 80 MG  tablet Take 1 tablet (80 mg total) by mouth 2 (two) times daily. 06/06/21 08/30/21  Sidney Ace, MD  sucralfate (CARAFATE) 1 GM/10ML suspension Take by mouth. Cancer Doc 10/20/15 07/09/19  [provider]    Family History Family History  Problem Relation Age of Onset   Stroke Mother    Depression Mother    Breast cancer Mother 1   Breast cancer Maternal Aunt 14   Alcohol abuse Father    Stroke Father    Hypertension Father     Social History Social History   Tobacco Use   Smoking status: Former   Smokeless tobacco: Never  Scientific laboratory technician Use: Never used  Substance Use Topics   Alcohol use: Yes    Comment: 3 none last 24hrs   Drug use: No     Allergies   Amoxicillin-pot clavulanate, Codeine, Demerol [meperidine], Latex, Morphine and related, Oxycodone-acetaminophen, Tramadol, Buprenorphine hcl, Fentanyl, Strawberry extract, and Tapentadol   Review of Systems Review of Systems  Constitutional:  Negative for activity change, appetite change and fever.  Gastrointestinal:  Negative for abdominal pain.  Genitourinary:  Positive for dysuria, frequency, hematuria and urgency.  Musculoskeletal:  Positive for back pain.  Hematological: Negative.   Psychiatric/Behavioral: Negative.      Physical Exam Triage Vital Signs ED Triage Vitals  Enc Vitals Group     BP 11/05/21 1310 (!) 172/78     Pulse Rate 11/05/21 1310 (!) 56     Resp 11/05/21 1310 14     Temp 11/05/21 1310 98.5 F (36.9 C)     Temp Source 11/05/21 1310 Oral     SpO2 11/05/21 1310 95 %     Weight 11/05/21 1306 160 lb (72.6 kg)     Height 11/05/21 1306 5\' 7"  (1.702 m)     Head Circumference --      Peak Flow --      Pain Score 11/05/21 1306 6     Pain Loc --      Pain Edu? --      Excl. in [place]? --    No data found.  Updated Vital Signs BP (!) 172/78 (BP Location: Left Arm)    Pulse (!) 56    Temp 98.5 F (36.9 C) (Oral)    Resp 14    Ht 5\' 7"  (1.702 m)    Wt 160 lb (72.6 kg)     SpO2 95%    BMI 25.06 kg/m   Visual Acuity Right Eye Distance:   Left Eye Distance:   Bilateral Distance:    Right Eye Near:   Left  Eye Near:    Bilateral Near:     Physical Exam Vitals and nursing note reviewed.  Constitutional:      General: She is not in acute distress.    Appearance: Normal appearance. She is normal weight. She is not ill-appearing.  HENT:     Head: Normocephalic and atraumatic.  Cardiovascular:     Rate and Rhythm: Normal rate and regular rhythm.     Pulses: Normal pulses.     Heart sounds: Normal heart sounds. No murmur heard.   No gallop.  Pulmonary:     Effort: Pulmonary effort is normal.     Breath sounds: Normal breath sounds. No wheezing, rhonchi or rales.  Abdominal:     General: Abdomen is flat.     Palpations: Abdomen is soft.     Tenderness: There is no abdominal tenderness. There is no right CVA tenderness or left CVA tenderness.  Skin:    General: Skin is warm and dry.     Capillary Refill: Capillary refill takes less than 2 seconds.     Findings: No erythema or rash.  Neurological:     General: No focal deficit present.     Mental Status: She is alert and oriented to person, place, and time.  Psychiatric:        Mood and Affect: Mood normal.        Behavior: Behavior normal.        Thought Content: Thought content normal.        Judgment: Judgment normal.     UC Treatments / Results  Labs (all labs ordered are listed, but only abnormal results are displayed) Labs Reviewed  URINALYSIS, COMPLETE (UACMP) WITH MICROSCOPIC - Abnormal; Notable for the following components:      Result Value   APPearance CLOUDY (*)    Hgb urine dipstick MODERATE (*)    Protein, ur 100 (*)    Nitrite POSITIVE (*)    Leukocytes,Ua MODERATE (*)    Bacteria, UA MANY (*)    All other components within normal limits  URINE CULTURE    EKG   Radiology No results found.  Procedures Procedures (including critical care time)  Medications Ordered  in UC Medications - No data to display  Initial Impression / Assessment and Plan / UC Course  I have reviewed the triage vital signs and the nursing notes.  Pertinent labs & imaging results that were available during my care of the patient were reviewed by me and considered in my medical decision making (see chart for details).  Patient is a nontoxic-appearing 80 year old female here for evaluation of urinary complaints as outlined HPI above.  On physical exam patient has a benign cardiopulmonary exam with clear lung sounds in all fields.  No CVA tenderness on exam.  Abdomen is soft and nontender.  Urinalysis was collected at triage which shows moderate hemoglobin, 100 protein, nitrate positive, moderate leukocytes, greater than 50 WBCs, many bacteria.  We will send urine for culture.  I will discharge patient home with a diagnosis of UTI and treated with Macrobid twice daily for 5 days and give Pyridium to help with urinary discomfort.  ER return precautions reviewed with patient.   Final Clinical Impressions(s) / UC Diagnoses   Final diagnoses:  Acute cystitis with hematuria     Discharge Instructions      Take the Macrobid twice daily for 5 days with food for treatment of urinary tract infection.  Use the Pyridium every 8 hours as needed  for urinary discomfort.  This will turn your urine a bright red-orange.  Increase your oral fluid intake so that you increase your urine production and or flushing your urinary system.  Take an over-the-counter probiotic, such as Culturelle-Align-Activia, 1 hour after each dose of antibiotic to prevent diarrhea or yeast infections from forming.  We will culture urine and change the antibiotics if necessary.  Return for reevaluation, or see your primary care provider, for any new or worsening symptoms.      ED Prescriptions     Medication Sig Dispense Auth. Provider   nitrofurantoin, macrocrystal-monohydrate, (MACROBID) 100 MG capsule Take  1 capsule (100 mg total) by mouth 2 (two) times daily. 10 capsule Margarette Canada, NP   phenazopyridine (PYRIDIUM) 200 MG tablet Take 1 tablet (200 mg total) by mouth 3 (three) times daily. 6 tablet Margarette Canada, NP      PDMP not reviewed this encounter.   Margarette Canada, NP 11/05/21 [phone]

## 2021-11-08 LAB — URINE CULTURE: Culture: >=100000 — AB

## 2021-12-14 DIAGNOSIS — R1900 Intra-abdominal and pelvic swelling, mass and lump, unspecified site: Secondary | ICD-10-CM | POA: Diagnosis not present

## 2021-12-14 DIAGNOSIS — Z9889 Other specified postprocedural states: Secondary | ICD-10-CM | POA: Diagnosis not present

## 2021-12-14 DIAGNOSIS — Z760 Encounter for issue of repeat prescription: Secondary | ICD-10-CM | POA: Diagnosis not present

## 2021-12-27 DIAGNOSIS — M5030 Other cervical disc degeneration, unspecified cervical region: Secondary | ICD-10-CM | POA: Diagnosis not present

## 2021-12-27 DIAGNOSIS — M47812 Spondylosis without myelopathy or radiculopathy, cervical region: Secondary | ICD-10-CM | POA: Diagnosis not present

## 2021-12-30 ENCOUNTER — Encounter: Payer: Self-pay | Admitting: Ophthalmology

## 2022-01-04 DIAGNOSIS — M47812 Spondylosis without myelopathy or radiculopathy, cervical region: Secondary | ICD-10-CM | POA: Diagnosis not present

## 2022-01-05 NOTE — Discharge Instructions (Signed)

## 2022-01-07 ENCOUNTER — Ambulatory Visit: Payer: Medicare HMO | Admitting: Anesthesiology

## 2022-01-07 ENCOUNTER — Encounter: Payer: Self-pay | Admitting: Ophthalmology

## 2022-01-07 ENCOUNTER — Ambulatory Visit
Admission: RE | Admit: 2022-01-07 | Discharge: 2022-01-07 | Disposition: A | Discharge status: Home / Self Care | Payer: Medicare HMO | Attending: Ophthalmology | Admitting: Ophthalmology

## 2022-01-07 ENCOUNTER — Other Ambulatory Visit: Payer: Self-pay

## 2022-01-07 ENCOUNTER — Encounter
Admission: RE | Disposition: A | Discharge status: Home / Self Care | Payer: Self-pay | Source: Home / Self Care | Attending: Ophthalmology

## 2022-01-07 DIAGNOSIS — Z8673 Personal history of transient ischemic attack (TIA), and cerebral infarction without residual deficits: Secondary | ICD-10-CM | POA: Diagnosis not present

## 2022-01-07 DIAGNOSIS — Z85118 Personal history of other malignant neoplasm of bronchus and lung: Secondary | ICD-10-CM | POA: Insufficient documentation

## 2022-01-07 DIAGNOSIS — Z87891 Personal history of nicotine dependence: Secondary | ICD-10-CM | POA: Insufficient documentation

## 2022-01-07 DIAGNOSIS — H02834 Dermatochalasis of left upper eyelid: Secondary | ICD-10-CM | POA: Insufficient documentation

## 2022-01-07 DIAGNOSIS — I4891 Unspecified atrial fibrillation: Secondary | ICD-10-CM | POA: Insufficient documentation

## 2022-01-07 DIAGNOSIS — Z9221 Personal history of antineoplastic chemotherapy: Secondary | ICD-10-CM | POA: Insufficient documentation

## 2022-01-07 DIAGNOSIS — I10 Essential (primary) hypertension: Secondary | ICD-10-CM | POA: Insufficient documentation

## 2022-01-07 DIAGNOSIS — K219 Gastro-esophageal reflux disease without esophagitis: Secondary | ICD-10-CM | POA: Insufficient documentation

## 2022-01-07 DIAGNOSIS — H02831 Dermatochalasis of right upper eyelid: Secondary | ICD-10-CM | POA: Diagnosis not present

## 2022-01-07 DIAGNOSIS — Z8711 Personal history of peptic ulcer disease: Secondary | ICD-10-CM | POA: Insufficient documentation

## 2022-01-07 HISTORY — PX: BROW LIFT: SHX178

## 2022-01-07 SURGERY — BLEPHAROPLASTY
Anesthesia: General | Laterality: Bilateral

## 2022-01-07 MED ORDER — TETRACAINE HCL 0.5 % OP SOLN
OPHTHALMIC | Status: DC | PRN
  Administered 2022-01-07: 2 [drp] via OPHTHALMIC

## 2022-01-07 MED ORDER — LIDOCAINE-EPINEPHRINE 2 %-1:100000 IJ SOLN
INTRAMUSCULAR | Status: DC | PRN
  Administered 2022-01-07: 3 mL via OPHTHALMIC

## 2022-01-07 MED ORDER — ERYTHROMYCIN 5 MG/GM OP OINT: TOPICAL_OINTMENT | OPHTHALMIC | 2 refills | Status: DC

## 2022-01-07 MED ORDER — ACETAMINOPHEN 325 MG PO TABS
650.0000 mg | ORAL_TABLET | Freq: Four times a day (QID) | ORAL | Status: DC | PRN
  Administered 2022-01-07: 650 mg via ORAL

## 2022-01-07 MED ORDER — OXYCODONE HCL 5 MG PO TABS: 5.0000 mg | ORAL_TABLET | Freq: Once | ORAL | Status: DC | PRN

## 2022-01-07 MED ORDER — OXYCODONE HCL 5 MG/5ML PO SOLN: 5.0000 mg | Freq: Once | ORAL | Status: DC | PRN

## 2022-01-07 MED ORDER — MIDAZOLAM HCL 2 MG/2ML IJ SOLN
INTRAMUSCULAR | Status: DC | PRN
  Administered 2022-01-07 (×2): 1 mg via INTRAVENOUS

## 2022-01-07 MED ORDER — LACTATED RINGERS IV SOLN: INTRAVENOUS | Status: DC

## 2022-01-07 MED ORDER — DIPHENHYDRAMINE HCL 50 MG/ML IJ SOLN
INTRAMUSCULAR | Status: DC | PRN
  Administered 2022-01-07: 6.25 mg via INTRAVENOUS

## 2022-01-07 MED ORDER — ERYTHROMYCIN 5 MG/GM OP OINT
TOPICAL_OINTMENT | OPHTHALMIC | Status: DC | PRN
  Administered 2022-01-07: 1 via OPHTHALMIC

## 2022-01-07 MED ORDER — HYDROMORPHONE HCL 1 MG/ML IJ SOLN: 0.2500 mg | INTRAMUSCULAR | Status: DC | PRN

## 2022-01-07 MED ORDER — HYDRALAZINE HCL 20 MG/ML IJ SOLN
INTRAMUSCULAR | Status: DC | PRN
  Administered 2022-01-07: 10 mg via INTRAVENOUS

## 2022-01-07 MED ORDER — PROMETHAZINE HCL 25 MG/ML IJ SOLN: 6.2500 mg | INTRAMUSCULAR | Status: DC | PRN

## 2022-01-07 MED ORDER — ALFENTANIL 500 MCG/ML IJ INJ
INJECTION | INTRAVENOUS | Status: DC | PRN
  Administered 2022-01-07: 200 ug via INTRAVENOUS
  Administered 2022-01-07 (×2): 400 ug via INTRAVENOUS

## 2022-01-07 MED ORDER — BSS IO SOLN
INTRAOCULAR | Status: DC | PRN
  Administered 2022-01-07: 15 mL via INTRAOCULAR

## 2022-01-07 MED ORDER — PROPOFOL 500 MG/50ML IV EMUL
INTRAVENOUS | Status: DC | PRN
  Administered 2022-01-07: 25 ug/kg/min via INTRAVENOUS

## 2022-01-07 SURGICAL SUPPLY — 22 items
APPLICATOR COTTON TIP WD 3 STR (MISCELLANEOUS) ×2 IMPLANT
BLADE SURG 15 STRL LF DISP TIS (BLADE) ×1 IMPLANT
BLADE SURG 15 STRL SS (BLADE) ×2
CORD BIP STRL DISP 12FT (MISCELLANEOUS) ×2 IMPLANT
GAUZE SPONGE 4X4 12PLY STRL (GAUZE/BANDAGES/DRESSINGS) ×3 IMPLANT
GLOVE SURG UNDER POLY LF SZ7 (GLOVE) ×4 IMPLANT
GOWN STRL REUS W/ TWL LRG LVL3 (GOWN DISPOSABLE) ×1 IMPLANT
GOWN STRL REUS W/TWL LRG LVL3 (GOWN DISPOSABLE) ×2
MARKER SKIN XFINE TIP W/RULER (MISCELLANEOUS) ×2 IMPLANT
NDL FILTER BLUNT 18X1 1/2 (NEEDLE) ×1 IMPLANT
NDL HYPO 30X.5 LL (NEEDLE) ×2 IMPLANT
NEEDLE FILTER BLUNT 18X 1/2SAF (NEEDLE) ×1
NEEDLE FILTER BLUNT 18X1 1/2 (NEEDLE) ×1 IMPLANT
NEEDLE HYPO 30X.5 LL (NEEDLE) ×4 IMPLANT
PACK ENT CUSTOM (PACKS) ×2 IMPLANT
SOL PREP PVP 2OZ (MISCELLANEOUS) ×2
SOLUTION PREP PVP 2OZ (MISCELLANEOUS) ×1 IMPLANT
SPONGE GAUZE 2X2 8PLY STRL LF (GAUZE/BANDAGES/DRESSINGS) ×20 IMPLANT
SUT GUT PLAIN 6-0 1X18 ABS (SUTURE) ×2 IMPLANT
SYR 10ML LL (SYRINGE) ×2 IMPLANT
SYR 3ML LL SCALE MARK (SYRINGE) ×2 IMPLANT
WATER STERILE IRR 250ML POUR (IV SOLUTION) ×2 IMPLANT

## 2022-01-07 NOTE — Anesthesia Postprocedure Evaluation (Signed)
Anesthesia Post Note  Patient: Kelly Krause  Procedure(s) Performed: BLEPHAROPLASTY UPPER EYELID; W/ EXCESS SKIN BILATERAL (Bilateral)     Patient location during evaluation: PACU Anesthesia Type: General Level of consciousness: awake and alert Pain management: pain level controlled Vital Signs Assessment: post-procedure vital signs reviewed and stable Respiratory status: spontaneous breathing, nonlabored ventilation and respiratory function stable Cardiovascular status: blood pressure returned to baseline and stable Postop Assessment: no apparent nausea or vomiting Anesthetic complications: no   No notable events documented.  April Manson

## 2022-01-07 NOTE — Interval H&P Note (Signed)
History and Physical Interval Note:  01/07/2022 12:19 PM  Kelly Krause  has presented today for surgery, with the diagnosis of H02.831 Dermatochalasis of Right Upper Eyelid H02.834 Dermatochalasis of Left Upper Eyelid.  The various methods of treatment have been discussed with the patient and family. After consideration of risks, benefits and other options for treatment, the patient has consented to  Procedure(s): BLEPHAROPLASTY UPPER EYELID; W/ EXCESS SKIN BILATERAL (Bilateral) as a surgical intervention.  The patient's history has been reviewed, patient examined, no change in status, stable for surgery.  I have reviewed the patient's chart and labs.  Questions were answered to the patient's satisfaction.     Vickki Muff, [place] Oster[place]out M

## 2022-01-07 NOTE — H&P (Signed)
[place]: [facility]  Primary Care Physician:  Dayton Martes Victoriano Lain, NP Ophthalmologist: Dr. Philis Pique. Vickki Muff, M.D.  Pre-Procedure History & Physical: HPI:  Kelly Krause is a 81 y.o. female here for periocular surgery.   Past Medical History:  Diagnosis Date   Allergy    Atrial fibrillation ([place])    Cancer ([place])    esophagus/chemo and rad   Colon cancer ([place])    chemo/rad 15 yrs   Colon cancer ([place])    2 yrs ago   DDD (degenerative disc disease), cervical    Dysrhythmia    Elevated lipids    GERD (gastroesophageal reflux disease)    Hyperlipidemia    Hypertension    Lung cancer (HCC)    chemo   Lung cancer ([place])    Major depressive disorder, recurrent episode, moderate (HCC)    Persistent disorder of initiating or maintaining sleep    Personal history of chemotherapy    Personal history of radiation therapy    colon   Stroke ([place]) 06/03/2021   No Deficits   SVT (supraventricular tachycardia) ([place])     Past Surgical History:  Procedure Laterality Date   ABDOMINAL HYSTERECTOMY     ATRIAL FIBRILLATION ABLATION     BUNIONECTOMY     CAPSULOTOMY METATARSOPHALANGEAL Left 04/19/2017   Procedure: CAPSULOTOMY METATARSOPHALANGEAL;  Surgeon: Samara Deist, DPM;  Location: [place];  Service: Podiatry;  Laterality: Left;   CATARACT EXTRACTION W/PHACO Right 08/16/2021   Procedure: CATARACT EXTRACTION PHACO AND INTRAOCULAR LENS PLACEMENT (IOC) RIGHT Eyhance Toric;  Surgeon: Eulogio Bear, MD;  Location: [place];  Service: Ophthalmology;  Laterality: Right;  5.02 00:33.8   CATARACT EXTRACTION W/PHACO Left 08/30/2021   Procedure: CATARACT EXTRACTION PHACO AND INTRAOCULAR LENS PLACEMENT (IOC) LEFT 5.10 00:43.4;  Surgeon: Eulogio Bear, MD;  Location: [place];  Service: Ophthalmology;  Laterality: Left;   COLECTOMY     COLONOSCOPY WITH PROPOFOL N/A 09/26/2017   Procedure: COLONOSCOPY WITH PROPOFOL;  Surgeon: Lollie Sails, MD;  Location: [facility] ENDOSCOPY;  Service: Endoscopy;  Laterality: N/A;   ESOPHAGOGASTRODUODENOSCOPY N/A 04/21/2015   Procedure: ESOPHAGOGASTRODUODENOSCOPY (EGD);  Surgeon: Lollie Sails, MD;  Location: [facility] ENDOSCOPY;  Service: Endoscopy;  Laterality: N/A;   Esophgeal cancer     HAMMER TOE SURGERY Left 04/19/2017   Procedure: HAMMER TOE CORRECTION-3RD & 4TH;  Surgeon: Samara Deist, DPM;  Location: [place];  Service: Podiatry;  Laterality: Left;   LOBECTOMY Right    METATARSAL OSTEOTOMY Left 04/19/2017   Procedure: PHALANX OSTEOTOMY-AKIN - LEFT;  Surgeon: Samara Deist, DPM;  Location: [place];  Service: Podiatry;  Laterality: Left;   PARTIAL HYSTERECTOMY     SINUSOTOMY      Prior to Admission medications   Medication Sig Start Date End Date Taking? Authorizing Provider  amLODipine (NORVASC) 10 MG tablet TAKE ONE TABLET BY MOUTH ONCE DAILY-NEED  APPOINTMENT Patient taking differently: Take 10 mg by mouth daily. 11/01/16  Yes Juline Patch, MD  atorvastatin (LIPITOR) 40 MG tablet Take 1 tablet (40 mg total) by mouth daily. 06/06/21 01/07/22 Yes Sreenath, Sudheer B, MD  BIOTIN PO Take 1 tablet by mouth daily.   Yes [provider]  buPROPion (WELLBUTRIN SR) 150 MG 12 hr tablet Take 1 tablet (150 mg total) by mouth 2 (two) times daily. 11/01/16  Yes Juline Patch, MD  Cholecalciferol (VITAMIN D-3) 1000 UNITS CAPS Take 1,000 Units daily by mouth.    Yes [provider]  diphenhydrAMINE (BENADRYL) 25 mg capsule Take 50-75 mg every 6 (six) hours as needed by mouth for itching.    Yes [provider]  fexofenadine (ALLEGRA) 180 MG tablet Take 180 mg by mouth at bedtime.   Yes [provider]  fluticasone (FLONASE) 50 MCG/ACT nasal spray USE ONE SPRAY(S) IN EACH NOSTRIL ONCE DAILY Patient taking differently: Place 2 sprays into both nostrils daily. 11/01/16  Yes Juline Patch, MD  hydrochlorothiazide (MICROZIDE) 12.5 MG capsule  Take 1 capsule (12.5 mg total) by mouth daily. 06/07/21 01/07/22 Yes Sreenath, Sudheer B, MD  Multiple Vitamins-Minerals (MULTIVITAMIN PO) Take 1 tablet daily by mouth.   Yes [provider]  Multiple Vitamins-Minerals (OCUVITE ADULT 50+ PO) Take 1 tablet by mouth daily.   Yes [provider]  pantoprazole (PROTONIX) 40 MG tablet Take 1 tablet (40 mg total) by mouth 2 (two) times daily. 11/01/16  Yes Juline Patch, MD  ramipril (ALTACE) 10 MG capsule TAKE ONE CAPSULE BY MOUTH ONCE DAILY. MUST SCHEDULE APPOINTMENT FOR NOVEMBER 11/01/16  Yes Juline Patch, MD  senna-docusate (SENOKOT-S) 8.6-50 MG tablet Take 2 tablets by mouth at bedtime.   Yes [provider]  sertraline (ZOLOFT) 25 MG tablet Take 1 tablet (25 mg total) by mouth daily. Patient taking differently: Take 100 mg by mouth daily. 11/01/16  Yes Juline Patch, MD  sotalol (BETAPACE) 80 MG tablet Take 1 tablet (80 mg total) by mouth 2 (two) times daily. 06/06/21 01/07/22 Yes Sreenath, Trula Slade, MD  apixaban (ELIQUIS) 5 MG TABS tablet Take 1 tablet (5 mg total) by mouth 2 (two) times daily. 06/21/21 11/05/21  Sidney Ace, MD  nitrofurantoin, macrocrystal-monohydrate, (MACROBID) 100 MG capsule Take 1 capsule (100 mg total) by mouth 2 (two) times daily. Patient not taking: Reported on 12/30/2021 11/05/21   Margarette Canada, NP  phenazopyridine (PYRIDIUM) 200 MG tablet Take 1 tablet (200 mg total) by mouth 3 (three) times daily. Patient not taking: Reported on 12/30/2021 11/05/21   Margarette Canada, NP  sucralfate (CARAFATE) 1 GM/10ML suspension Take by mouth. Cancer Doc 10/20/15 07/09/19  [provider]    Allergies as of 11/08/2021 - Review Complete 11/05/2021  Allergen Reaction Noted   Amoxicillin-pot clavulanate Itching, Nausea Only, and Other (See Comments) 04/14/2015   Codeine Nausea Only and Other (See Comments) 03/27/2015   Demerol [meperidine] Itching, Nausea Only, and Other (See Comments) 03/27/2015    Latex Hives and Itching 03/27/2015   Morphine and related Itching, Nausea Only, and Other (See Comments) 04/14/2015   Oxycodone-acetaminophen Itching and Nausea Only 04/14/2015   Tramadol Other (See Comments) 04/14/2015   Buprenorphine hcl Itching, Nausea Only, and Other (See Comments) 06/16/2015   Fentanyl Itching and Nausea And Vomiting 11/04/2015   Strawberry extract Nausea And Vomiting and Rash 06/02/2015   Tapentadol Rash 04/14/2015    Family History  Problem Relation Age of Onset   Stroke Mother    Depression Mother    Breast cancer Mother 48   Breast cancer Maternal Aunt 53   Alcohol abuse Father    Stroke Father    Hypertension Father     Social History   Socioeconomic History   Marital status: Married    Spouse name: Not on file   Number of children: Not on file   Years of education: Not on file   Highest education level: Not on file  Occupational History   Not on file  Tobacco Use   Smoking status: Former   Smokeless  tobacco: Never  Vaping Use   Vaping Use: Never used  Substance and Sexual Activity   Alcohol use: Yes    Comment: 3 none last 24hrs   Drug use: No   Sexual activity: Not Currently  Other Topics Concern   Not on file  Social History Narrative   Not on file   Social Determinants of Health   Financial Resource Strain: Not on file  Food Insecurity: Not on file  Transportation Needs: Not on file  Physical Activity: Not on file  Stress: Not on file  Social Connections: Not on file  Intimate Partner Violence: Not on file    Review of Systems: See HPI, otherwise negative ROS  Physical Exam: BP (!) 155/71    Pulse (!) 52    Temp 98.4 F (36.9 C) (Temporal)    Resp 18    Ht 5\' 7"  (1.702 m)    Wt 81.2 kg    SpO2 96%    BMI 28.04 kg/m  General:   Alert and cooperative in NAD Head:  Normocephalic and atraumatic. Respiratory:  Normal work of breathing.  Impression/Plan: Jeanell Mangan is here for periocular surgery.  Risks, benefits,  limitations, and alternatives regarding surgery have been reviewed with the patient.  Questions have been answered.  All parties agreeable.   Karle Starch, MD  01/07/2022, 12:19 PM

## 2022-01-07 NOTE — Op Note (Signed)
Preoperative Diagnosis:  Visually significant dermatochalasis bilateral  Upper Eyelid(s)  Postoperative Diagnosis:  Same.  Procedure(s) Performed:   Upper eyelid blepharoplasty with excess skin excision  bilateral  Upper Eyelid(s)  Surgeon: Philis Pique. Vickki Muff, M.D.  Assistants: none  Anesthesia: MAC  Specimens: None.  Estimated Blood Loss: Minimal.  Complications: None.  Operative Findings: None Dictated  Procedure:   Allergies were reviewed and the patient is allergic to Amoxicillin-pot clavulanate, Codeine, Demerol [meperidine], Latex, Morphine and related, Oxycodone-acetaminophen, Tramadol, Buprenorphine hcl, Fentanyl, Strawberry extract, and Tapentadol.   After the risks, benefits, complications and alternatives were discussed with the patient, appropriate informed consent was obtained and the patient was brought to the operating suite. The patient was reclined supine and a timeout was conducted.  The patient was then sedated.  Local anesthetic consisting of a 50-50 mixture of 2% lidocaine with epinephrine and 0.75% bupivacaine with added Hylenex was injected subcutaneously to both  upper eyelid(s). After adequate local was instilled, the patient was prepped and draped in the usual sterile fashion for eyelid surgery.   Attention was turned to the upper eyelids. A 35m upper eyelid crease incision line was marked with calipers on both  upper eyelid(s).  A pinch test was used to estimate the amount of excess skin to remove and this was marked in standard blepharoplasty style fashion. Attention was turned to the  right  upper eyelid. A #15 blade was used to open the premarked incision line. A Skin and muscle flap was excised and hemostasis was obtained with bipolar cautery. A strip of ROOF fat was excised to help debulk the lateral eyelid.  Attention was then turned to the opposite eyelid where the same procedure was performed in the same manner. Hemostasis was obtained with bipolar  cautery throughout. All incisions were then closed with a combination of running and interrupted 6-0 fast absorbing plain suture. The patient tolerated the procedure well.  Erythromycin ophthalmic ointment was applied to her incision sites, followed by ice packs. She was taken to the recovery area where she recovered without difficulty.  Post-Op Plan/Instructions:  The patient was instructed to use ice packs frequently for the next 48 hours. She was instructed to use Erythromycin ophthalmic ointment on her incisions 4 times a day for the next 12 to 14 days. She was given a prescription for tramadol (or similar) for pain control should Tylenol not be effective. She was asked to to [facility] [place] as needed for problems.  Chistopher Mangino M. FVickki Muff M.D. Ophthalmology

## 2022-01-07 NOTE — Transfer of Care (Signed)
Immediate Anesthesia Transfer of Care Note  Patient: Kelly Krause  Procedure(s) Performed: BLEPHAROPLASTY UPPER EYELID; W/ EXCESS SKIN BILATERAL (Bilateral)  Patient Location: PACU  Anesthesia Type: General  Level of Consciousness: awake, alert  and patient cooperative  Airway and Oxygen Therapy: Patient Spontanous Breathing and Patient connected to supplemental oxygen  Post-op Assessment: Post-op Vital signs reviewed, Patient's Cardiovascular Status Stable, Respiratory Function Stable, Patent Airway and No signs of Nausea or vomiting  Post-op Vital Signs: Reviewed and stable  Complications: No notable events documented.

## 2022-01-07 NOTE — Anesthesia Preprocedure Evaluation (Signed)
Anesthesia Evaluation  Patient identified by MRN, date of birth, ID band Patient awake    Reviewed: Allergy & Precautions, H&P , NPO status , Patient's Chart, lab work & pertinent test results  Airway Mallampati: II  TM Distance: >3 FB Neck ROM: full    Dental no notable dental hx.    Pulmonary former smoker,  H/o mets to lung, s/p chemo   Pulmonary exam normal breath sounds clear to auscultation       Cardiovascular hypertension, + dysrhythmias Atrial Fibrillation and Supra Ventricular Tachycardia  Rhythm:regular Rate:Normal     Neuro/Psych PSYCHIATRIC DISORDERS CVA    GI/Hepatic PUD, GERD  ,  Endo/Other    Renal/GU      Musculoskeletal   Abdominal   Peds  Hematology   Anesthesia Other Findings   Reproductive/Obstetrics                             Anesthesia Physical  Anesthesia Plan  ASA: 3  Anesthesia Plan: General   Post-op Pain Management:    Induction:   PONV Risk Score and Plan: 2 and Treatment may vary due to age or medical condition, TIVA and Midazolam  Airway Management Planned:   Additional Equipment:   Intra-op Plan:   Post-operative Plan:   Informed Consent: I have reviewed the patients History and Physical, chart, labs and discussed the procedure including the risks, benefits and alternatives for the proposed anesthesia with the patient or authorized representative who has indicated his/her understanding and acceptance.     Dental Advisory Given  Plan Discussed with: CRNA  Anesthesia Plan Comments:         Anesthesia Quick Evaluation

## 2022-01-10 ENCOUNTER — Encounter: Payer: Self-pay | Admitting: Ophthalmology

## 2022-01-17 DIAGNOSIS — Z0000 Encounter for general adult medical examination without abnormal findings: Secondary | ICD-10-CM | POA: Diagnosis not present

## 2022-01-17 DIAGNOSIS — Z8673 Personal history of transient ischemic attack (TIA), and cerebral infarction without residual deficits: Secondary | ICD-10-CM | POA: Diagnosis not present

## 2022-01-17 DIAGNOSIS — R69 Illness, unspecified: Secondary | ICD-10-CM | POA: Diagnosis not present

## 2022-01-17 DIAGNOSIS — K219 Gastro-esophageal reflux disease without esophagitis: Secondary | ICD-10-CM | POA: Diagnosis not present

## 2022-01-17 DIAGNOSIS — Z79899 Other long term (current) drug therapy: Secondary | ICD-10-CM | POA: Diagnosis not present

## 2022-01-17 DIAGNOSIS — F3341 Major depressive disorder, recurrent, in partial remission: Secondary | ICD-10-CM | POA: Diagnosis not present

## 2022-01-17 DIAGNOSIS — I471 Supraventricular tachycardia: Secondary | ICD-10-CM | POA: Diagnosis not present

## 2022-01-17 DIAGNOSIS — R7302 Impaired glucose tolerance (oral): Secondary | ICD-10-CM | POA: Diagnosis not present

## 2022-01-17 DIAGNOSIS — I480 Paroxysmal atrial fibrillation: Secondary | ICD-10-CM | POA: Diagnosis not present

## 2022-01-17 DIAGNOSIS — I10 Essential (primary) hypertension: Secondary | ICD-10-CM | POA: Diagnosis not present

## 2022-01-17 DIAGNOSIS — E782 Mixed hyperlipidemia: Secondary | ICD-10-CM | POA: Diagnosis not present

## 2022-01-19 DIAGNOSIS — M5416 Radiculopathy, lumbar region: Secondary | ICD-10-CM | POA: Diagnosis not present

## 2022-01-19 DIAGNOSIS — M48062 Spinal stenosis, lumbar region with neurogenic claudication: Secondary | ICD-10-CM | POA: Diagnosis not present

## 2022-01-28 DIAGNOSIS — M48062 Spinal stenosis, lumbar region with neurogenic claudication: Secondary | ICD-10-CM | POA: Diagnosis not present

## 2022-01-28 DIAGNOSIS — I10 Essential (primary) hypertension: Secondary | ICD-10-CM | POA: Diagnosis not present

## 2022-01-28 DIAGNOSIS — M47812 Spondylosis without myelopathy or radiculopathy, cervical region: Secondary | ICD-10-CM | POA: Diagnosis not present

## 2022-03-01 DIAGNOSIS — M5030 Other cervical disc degeneration, unspecified cervical region: Secondary | ICD-10-CM | POA: Diagnosis not present

## 2022-03-01 DIAGNOSIS — M5416 Radiculopathy, lumbar region: Secondary | ICD-10-CM | POA: Diagnosis not present

## 2022-03-01 DIAGNOSIS — M48062 Spinal stenosis, lumbar region with neurogenic claudication: Secondary | ICD-10-CM | POA: Diagnosis not present

## 2022-03-01 DIAGNOSIS — M47812 Spondylosis without myelopathy or radiculopathy, cervical region: Secondary | ICD-10-CM | POA: Diagnosis not present

## 2022-03-14 DIAGNOSIS — L821 Other seborrheic keratosis: Secondary | ICD-10-CM | POA: Diagnosis not present

## 2022-03-14 DIAGNOSIS — L570 Actinic keratosis: Secondary | ICD-10-CM | POA: Diagnosis not present

## 2022-04-04 DIAGNOSIS — I4891 Unspecified atrial fibrillation: Secondary | ICD-10-CM | POA: Diagnosis not present

## 2022-04-04 DIAGNOSIS — I69351 Hemiplegia and hemiparesis following cerebral infarction affecting right dominant side: Secondary | ICD-10-CM | POA: Diagnosis not present

## 2022-04-04 DIAGNOSIS — R32 Unspecified urinary incontinence: Secondary | ICD-10-CM | POA: Diagnosis not present

## 2022-04-04 DIAGNOSIS — K219 Gastro-esophageal reflux disease without esophagitis: Secondary | ICD-10-CM | POA: Diagnosis not present

## 2022-04-04 DIAGNOSIS — D6869 Other thrombophilia: Secondary | ICD-10-CM | POA: Diagnosis not present

## 2022-04-04 DIAGNOSIS — E785 Hyperlipidemia, unspecified: Secondary | ICD-10-CM | POA: Diagnosis not present

## 2022-04-04 DIAGNOSIS — K5900 Constipation, unspecified: Secondary | ICD-10-CM | POA: Diagnosis not present

## 2022-04-04 DIAGNOSIS — R69 Illness, unspecified: Secondary | ICD-10-CM | POA: Diagnosis not present

## 2022-04-04 DIAGNOSIS — M1990 Unspecified osteoarthritis, unspecified site: Secondary | ICD-10-CM | POA: Diagnosis not present

## 2022-04-04 DIAGNOSIS — J301 Allergic rhinitis due to pollen: Secondary | ICD-10-CM | POA: Diagnosis not present

## 2022-04-04 DIAGNOSIS — I10 Essential (primary) hypertension: Secondary | ICD-10-CM | POA: Diagnosis not present

## 2022-04-04 DIAGNOSIS — Z008 Encounter for other general examination: Secondary | ICD-10-CM | POA: Diagnosis not present

## 2022-04-13 DIAGNOSIS — L570 Actinic keratosis: Secondary | ICD-10-CM | POA: Diagnosis not present

## 2022-05-04 DIAGNOSIS — L255 Unspecified contact dermatitis due to plants, except food: Secondary | ICD-10-CM | POA: Diagnosis not present

## 2022-05-04 DIAGNOSIS — L03116 Cellulitis of left lower limb: Secondary | ICD-10-CM | POA: Diagnosis not present

## 2022-05-04 DIAGNOSIS — R2681 Unsteadiness on feet: Secondary | ICD-10-CM | POA: Diagnosis not present

## 2022-05-04 DIAGNOSIS — R600 Localized edema: Secondary | ICD-10-CM | POA: Diagnosis not present

## 2022-05-04 DIAGNOSIS — Z8673 Personal history of transient ischemic attack (TIA), and cerebral infarction without residual deficits: Secondary | ICD-10-CM | POA: Diagnosis not present

## 2022-05-04 DIAGNOSIS — M21372 Foot drop, left foot: Secondary | ICD-10-CM | POA: Diagnosis not present

## 2022-05-19 ENCOUNTER — Ambulatory Visit: Payer: Medicare HMO | Attending: Nurse Practitioner | Admitting: Physical Therapy

## 2022-05-23 DIAGNOSIS — R399 Unspecified symptoms and signs involving the genitourinary system: Secondary | ICD-10-CM | POA: Diagnosis not present

## 2022-05-26 ENCOUNTER — Ambulatory Visit: Payer: Medicare HMO | Attending: Nurse Practitioner | Admitting: Physical Therapy

## 2022-05-26 DIAGNOSIS — R2689 Other abnormalities of gait and mobility: Secondary | ICD-10-CM | POA: Insufficient documentation

## 2022-05-26 DIAGNOSIS — R269 Unspecified abnormalities of gait and mobility: Secondary | ICD-10-CM | POA: Insufficient documentation

## 2022-05-26 DIAGNOSIS — M6281 Muscle weakness (generalized): Secondary | ICD-10-CM | POA: Insufficient documentation

## 2022-05-31 ENCOUNTER — Ambulatory Visit: Payer: Medicare HMO | Attending: Nurse Practitioner | Admitting: Physical Therapy

## 2022-06-02 ENCOUNTER — Encounter: Payer: Medicare HMO | Admitting: Physical Therapy

## 2022-06-06 ENCOUNTER — Ambulatory Visit: Payer: Medicare HMO | Admitting: Physical Therapy

## 2022-06-07 ENCOUNTER — Encounter: Payer: Medicare HMO | Admitting: Physical Therapy

## 2022-06-08 ENCOUNTER — Ambulatory Visit: Payer: Medicare HMO | Admitting: Physical Therapy

## 2022-06-09 ENCOUNTER — Encounter: Payer: Medicare HMO | Admitting: Physical Therapy

## 2022-06-13 ENCOUNTER — Ambulatory Visit: Payer: Medicare HMO | Admitting: Physical Therapy

## 2022-06-13 ENCOUNTER — Encounter: Payer: Self-pay | Admitting: Physical Therapy

## 2022-06-13 DIAGNOSIS — R2689 Other abnormalities of gait and mobility: Secondary | ICD-10-CM

## 2022-06-13 DIAGNOSIS — R269 Unspecified abnormalities of gait and mobility: Secondary | ICD-10-CM

## 2022-06-13 DIAGNOSIS — M6281 Muscle weakness (generalized): Secondary | ICD-10-CM | POA: Diagnosis not present

## 2022-06-13 NOTE — Therapy (Signed)
OUTPATIENT PHYSICAL THERAPY LOWER EXTREMITY EVALUATION   Patient Name: Kelly Krause MRN: [id number] DOB:[birth year], 81 y.o., female Today's Date: 06/13/2022   PT End of Session - 06/13/22 2010     Visit Number 1    Number of Visits 12    Date for PT Re-Evaluation 07/25/22    PT Start Time 1429    PT Stop Time 1521    PT Time Calculation (min) 52 min    Equipment Utilized During Treatment Gait belt    Activity Tolerance Patient tolerated treatment well    Behavior During Therapy WFL for tasks assessed/performed             Past Medical History:  Diagnosis Date   Allergy    Atrial fibrillation ([place])    Cancer ([place])    esophagus/chemo and rad   Colon cancer ([place])    chemo/rad 15 yrs   Colon cancer ([place])    2 yrs ago   DDD (degenerative disc disease), cervical    Dysrhythmia    Elevated lipids    GERD (gastroesophageal reflux disease)    Hyperlipidemia    Hypertension    Lung cancer ([place])    chemo   Lung cancer ([place])    Major depressive disorder, recurrent episode, moderate ([place])    Persistent disorder of initiating or maintaining sleep    Personal history of chemotherapy    Personal history of radiation therapy    colon   Stroke ([place]) 06/03/2021   No Deficits   SVT (supraventricular tachycardia) ([place])    Past Surgical History:  Procedure Laterality Date   ABDOMINAL HYSTERECTOMY     ATRIAL FIBRILLATION ABLATION     BROW LIFT Bilateral 01/07/2022   Procedure: BLEPHAROPLASTY UPPER EYELID; W/ EXCESS SKIN BILATERAL;  Surgeon: Karle Starch, MD;  Location: [facility];  Service: Ophthalmology;  Laterality: Bilateral;   BUNIONECTOMY     CAPSULOTOMY METATARSOPHALANGEAL Left 04/19/2017   Procedure: CAPSULOTOMY METATARSOPHALANGEAL;  Surgeon: Samara Deist, DPM;  Location: [facility];  Service: Podiatry;  Laterality: Left;   CATARACT EXTRACTION W/PHACO Right 08/16/2021   Procedure: CATARACT EXTRACTION PHACO AND INTRAOCULAR LENS PLACEMENT (IOC) RIGHT  Eyhance Toric;  Surgeon: Eulogio Bear, MD;  Location: [facility];  Service: Ophthalmology;  Laterality: Right;  5.02 00:33.8   CATARACT EXTRACTION W/PHACO Left 08/30/2021   Procedure: CATARACT EXTRACTION PHACO AND INTRAOCULAR LENS PLACEMENT (IOC) LEFT 5.10 00:43.4;  Surgeon: Eulogio Bear, MD;  Location: [facility];  Service: Ophthalmology;  Laterality: Left;   COLECTOMY     COLONOSCOPY WITH PROPOFOL N/A 09/26/2017   Procedure: COLONOSCOPY WITH PROPOFOL;  Surgeon: Lollie Sails, MD;  Location[facility] ENDOSCOPY;  Service: Endoscopy;  Laterality: N/A;   ESOPHAGOGASTRODUODENOSCOPY N/A 04/21/2015   Procedure: ESOPHAGOGASTRODUODENOSCOPY (EGD);  Surgeon: Lollie Sails, MD;  Location: [facility] ENDOSCOPY;  Service: Endoscopy;  Laterality: N/A;   Esophgeal cancer     HAMMER TOE SURGERY Left 04/19/2017   Procedure: HAMMER TOE CORRECTION-3RD & 4TH;  Surgeon: Samara Deist, DPM;  Location: [facility];  Service: Podiatry;  Laterality: Left;   LOBECTOMY Right    METATARSAL OSTEOTOMY Left 04/19/2017   Procedure: PHALANX OSTEOTOMY-AKIN - LEFT;  Surgeon: Samara Deist, DPM;  Location: [facility];  Service: Podiatry;  Laterality: Left;   PARTIAL HYSTERECTOMY     SINUSOTOMY     Patient Active Problem List   Diagnosis Date Noted   Acute CVA (cerebrovascular accident) ([place]) 06/04/2021   Stroke ([place]) 06/03/2021   Hypertensive  urgency 06/03/2021   Sinus bradycardia 06/03/2021   Cancer of cecum ([place]) 10/04/2017   Normocytic anemia 10/04/2017   A-fib ([place]) 06/16/2015   Cancer of colon ([place]) 06/16/2015   DD (diverticular disease) 06/16/2015   Esophageal ulcer 06/16/2015   Hallux abducto valgus 06/16/2015   Hammer toe 06/16/2015   H/O varicella 06/16/2015   HK (hyperkeratosis) 06/16/2015   BP (high blood pressure) 06/16/2015   Neuritis or radiculitis due to rupture of lumbar intervertebral disc 06/16/2015   Cancer of lung ([place]) 06/16/2015   Degenerative  arthritis of spine 06/16/2015   SCC (squamous cell carcinoma of esophagus) (HCC) 06/16/2015   Supraventricular tachycardia ([place]) 06/16/2015   Temporary cerebral vascular dysfunction 06/16/2015   Familial multiple lipoprotein-type hyperlipidemia 03/27/2015   Clinical depression 03/27/2015   Acid reflux 03/27/2015   CN (constipation) 03/27/2015   Ulcerative cystitis 03/27/2015   Routine general medical examination at a health care facility 03/27/2015   Allergic rhinitis 03/27/2015   H/O malignant neoplasm 03/27/2015   Contusion of knee and lower leg 03/27/2015   Narrowing of intervertebral disc space 03/27/2015   DDD (degenerative disc disease), cervical 03/27/2015   DDD (degenerative disc disease), lumbar 03/27/2015   H/O: depression 03/27/2015   Can't get food down 03/27/2015   Essential (primary) hypertension 03/27/2015   Cephalalgia 03/27/2015   Discitis of lumbar region 03/27/2015   Blood in the urine 03/27/2015   H/O: HTN (hypertension) 03/27/2015   Menopause 03/27/2015   Screening for depression 03/27/2015   Sinus infection 03/27/2015   FOM (frequency of micturition) 03/27/2015   Arrhythmia, ventricular 03/27/2015   Encounter for mammogram to establish baseline mammogram 03/27/2015   Atrial tachycardia ([place]) 03/27/2015   Lumbar and sacral osteoarthritis 11/03/2014   Atrial fibrillation ([place]) 09/18/2014   Cancer of esophagus ([place]) 06/25/2014   Malignant neoplasm of esophagus ([place]) 06/25/2014    PCP: Kelly Lange, NP  REFERRING PROVIDER: Sallee Lange, NP  REFERRING DIAG:  Diagnosis  R26.81 (ICD-10-CM) - Unsteadiness on feet  M21.372 (ICD-10-CM) - Foot drop, left foot  Z86.73 (ICD-10-CM) - Personal history of transient ischemic attack (TIA), and cerebral infarction without residual deficits   THERAPY DIAG:  Gait difficulty  Muscle weakness (generalized)  Imbalance  Rationale for Evaluation and Treatment Rehabilitation  ONSET DATE:  11/21/2021  SUBJECTIVE:   SUBJECTIVE STATEMENT: Pt. Reports falling 6x over past 6 months.  Pt. Lives with husband who has Alzheimer's and requires assistance.  Pt. Drives independently.  Pt. Walks in neighborhood and enjoys reading.  Pt. States she "feels clumsy and like she is doing to fall".  Pt. Reports no pain at this time and states her neck hurts sometimes.  MD f/u next month.     PERTINENT HISTORY: Kelly Lange, NP - 05/04/2022 11:30 AM EDT Formatting of this note is different from the original. Images from the original note were not included. ID Data: Moshe Cipro, 81 y.o. female  CC:  Chief Complaint  Patient presents with  Essie Christine 6 times in the last 6 months  Gait Problem  Unsteady gait  Foot Pain  Left foot pain from fall 2 weeks ago  Foot Swelling  Left foot swelling from fall 2 weeks ago  Mobility/balance Issues   HPI: Presents today with c/o unsteady gait, leading to ~6+ falls over the last 6 mon. Has been going on since had CVA last yr, but is getting worse. Last fall was ~2 wks ago & has Lt foot pain/swelling from this. Lt  foot may drag some on the ground & cause her to fall. Tries to concentrate on lifting leg as much as possible. Feels very unbalanced a lot. Has tried ice on Lt leg, which isn't helping swelling. Taking tylenol for pain, which may help. Lt foot & ankle hurt & Rt foot hurts. Does have both a cane & walking stick, but doesn't use either consistently.  PAIN:  Are you having pain? No  PRECAUTIONS: Fall  WEIGHT BEARING RESTRICTIONS No  FALLS:  Has patient fallen in last 6 months? No  LIVING ENVIRONMENT: Lives with: lives with their family and lives with their spouse Lives in: House/apartment Has following equipment at home: Single point cane  OCCUPATION: Retired  PLOF: Independent  PATIENT GOALS Decrease fall risk/ improve balance/ walking.     OBJECTIVE:  PATIENT SURVEYS:  FOTO initial 49/ goal 57.     COGNITION:  Overall cognitive status: Within functional limits for tasks assessed     SENSATION: WFL  EDEMA:  Pitting edema in B lower legs.    POSTURE: rounded shoulders and forward head (minimal)- able to self-correct.   LOWER EXTREMITY ROM:  Active ROM Right eval Left eval  Hip flexion [facility]  Hip extension    Hip abduction [facility]  Hip adduction    Hip internal rotation    Hip external rotation    Knee flexion [facility] WFL  Knee extension [facility] WFL  Ankle dorsiflexion 0 deg. -2 deg.   Ankle plantarflexion [facility]  Ankle inversion    Ankle eversion     (Blank rows = not tested)  LOWER EXTREMITY MMT:  MMT Right eval Left eval  Hip flexion 4+/5 4/5  Hip extension    Hip abduction 4+/5 4/5  Hip adduction 5/5 4+/5  Hip internal rotation    Hip external rotation    Knee flexion 5/5 5/5  Knee extension 5/5 4+/5  Ankle dorsiflexion 4/5 4/5  Ankle plantarflexion 4/5 4/5  Ankle inversion    Ankle eversion     (Blank rows = not tested)  FUNCTIONAL TESTS:  Berg Balance Scale: 41/56 (high fall risk)- explained to patient.    GAIT: Distance walked: in clinic Assistive device utilized: Single point cane Level of assistance: Modified independence and SBA Comments: Pt. Benefits from use of SPC or walking stick to improve step pattern/ heel strike.     TODAY'S TREATMENT: Evaluation only.  PT will issue HEP next tx. Session.     PATIENT EDUCATION:  Education details: Use of SPC Person educated: Patient Education method: Customer service manager Education comprehension: verbalized understanding and returned demonstration   HOME EXERCISE PROGRAM: TBD  ASSESSMENT:  CLINICAL IMPRESSION: Patient is a pleasant 81 y.o. female who was seen today for physical therapy evaluation and treatment for balance issues/ gait difficulty.  Pt. Presents with moderate B hip muscle weakness and limited ankle DF AROM.  Pt. Ambulates with limited step pattern/ hip flexion/ heel  strike without use of an assistive device.  Pt. Benefits from use of SPC/ walking stick with 2-point gait pattern to decrease fall risk.  Pt. Will benefit from skilled PT services to increase B hip/LE muscle strength to improve pain-free mobility/ decrease fall risk.     OBJECTIVE IMPAIRMENTS Abnormal gait, decreased activity tolerance, decreased balance, decreased coordination, decreased endurance, decreased mobility, difficulty walking, decreased ROM, decreased strength, increased edema, impaired flexibility, improper body mechanics, postural dysfunction, and pain.   ACTIVITY LIMITATIONS carrying, lifting, bending, standing, squatting, transfers, locomotion level, and caring  for others  PARTICIPATION LIMITATIONS: cleaning, driving, community activity, and yard work  PERSONAL FACTORS Age, Education, Fitness, and Past/current experiences are also affecting patient's functional outcome.   REHAB POTENTIAL: Good  CLINICAL DECISION MAKING: Evolving/moderate complexity  EVALUATION COMPLEXITY: Moderate   GOALS: Goals reviewed with patient? Yes  SHORT TERM GOALS: Target date: 07/04/22 Pt. Will be independent with HEP to increase hip flexor strength 1/2 muscle grade to improve step pattern/ decrease fall risk.   Baseline:  see HEP Goal status: INITIAL   LONG TERM GOALS: Target date: 07/25/22  Pt. Will increase FOTO score to 53 to improve pain-free mobility.   Baseline:  initial FOTO 49 Goal status: INITIAL  2.  Pt. Will increase Berg balance test to >48/56 to decrease fall risk/ improve functional mobility.   Baseline: Berg 41/56 Goal status: INITIAL  3.  Pt. Will ambulates with consistent 2-point gait pattern with use of SPC and report no falls consistently for while in PT.   Baseline: Pt. Reports numerous falls and is currently not using SPC Goal status: INITIAL   PLAN: PT FREQUENCY: 2x/week  PT DURATION: 6 weeks  PLANNED INTERVENTIONS: Therapeutic exercises, Therapeutic  activity, Neuromuscular re-education, Balance training, Gait training, Patient/Family education, Self Care, Joint mobilization, Stair training, Cryotherapy, Moist heat, and Manual therapy  PLAN FOR NEXT SESSION: Issue HEP next tx. Session.   Pura Spice, PT, DPT # [phone] 06/13/2022, 8:14 PM

## 2022-06-14 ENCOUNTER — Encounter: Payer: Medicare HMO | Admitting: Physical Therapy

## 2022-06-15 ENCOUNTER — Encounter: Payer: Self-pay | Admitting: Physical Therapy

## 2022-06-15 ENCOUNTER — Ambulatory Visit: Payer: Medicare HMO | Admitting: Physical Therapy

## 2022-06-15 DIAGNOSIS — M6281 Muscle weakness (generalized): Secondary | ICD-10-CM | POA: Diagnosis not present

## 2022-06-15 DIAGNOSIS — R2689 Other abnormalities of gait and mobility: Secondary | ICD-10-CM | POA: Diagnosis not present

## 2022-06-15 DIAGNOSIS — R269 Unspecified abnormalities of gait and mobility: Secondary | ICD-10-CM

## 2022-06-15 NOTE — Therapy (Signed)
OUTPATIENT PHYSICAL THERAPY LOWER EXTREMITY TREATMENT   Patient Name: Kelly Krause MRN: [id number] DOB:[birth year], 81 y.o., female Today's Date: 06/15/2022   PT End of Session - 06/15/22 1619     Visit Number 2    Number of Visits 12    Date for PT Re-Evaluation 07/25/22    PT Start Time 1431    PT Stop Time 1516    PT Time Calculation (min) 45 min    Equipment Utilized During Treatment Gait belt    Activity Tolerance Patient tolerated treatment well    Behavior During Therapy WFL for tasks assessed/performed             Past Medical History:  Diagnosis Date   Allergy    Atrial fibrillation ([place])    Cancer ([place])    esophagus/chemo and rad   Colon cancer ([place])    chemo/rad 15 yrs   Colon cancer ([place])    2 yrs ago   DDD (degenerative disc disease), cervical    Dysrhythmia    Elevated lipids    GERD (gastroesophageal reflux disease)    Hyperlipidemia    Hypertension    Lung cancer ([place])    chemo   Lung cancer ([place])    Major depressive disorder, recurrent episode, moderate ([place])    Persistent disorder of initiating or maintaining sleep    Personal history of chemotherapy    Personal history of radiation therapy    colon   Stroke ([place]) 06/03/2021   No Deficits   SVT (supraventricular tachycardia) ([place])    Past Surgical History:  Procedure Laterality Date   ABDOMINAL HYSTERECTOMY     ATRIAL FIBRILLATION ABLATION     BROW LIFT Bilateral 01/07/2022   Procedure: BLEPHAROPLASTY UPPER EYELID; W/ EXCESS SKIN BILATERAL;  Surgeon: Karle Starch, MD;  Location: [facility];  Service: Ophthalmology;  Laterality: Bilateral;   BUNIONECTOMY     CAPSULOTOMY METATARSOPHALANGEAL Left 04/19/2017   Procedure: CAPSULOTOMY METATARSOPHALANGEAL;  Surgeon: Samara Deist, DPM;  Location: [facility];  Service: Podiatry;  Laterality: Left;   CATARACT EXTRACTION W/PHACO Right 08/16/2021   Procedure: CATARACT EXTRACTION PHACO AND INTRAOCULAR LENS PLACEMENT (IOC) RIGHT  Eyhance Toric;  Surgeon: Eulogio Bear, MD;  Location: [place] [facility];  Service: Ophthalmology;  Laterality: Right;  5.02 00:33.8   CATARACT EXTRACTION W/PHACO Left 08/30/2021   Procedure: CATARACT EXTRACTION PHACO AND INTRAOCULAR LENS PLACEMENT (IOC) LEFT 5.10 00:43.4;  Surgeon: Eulogio Bear, MD;  Location: [facility];  Service: Ophthalmology;  Laterality: Left;   COLECTOMY     COLONOSCOPY WITH PROPOFOL N/A 09/26/2017   Procedure: COLONOSCOPY WITH PROPOFOL;  Surgeon: Lollie Sails, MD;  Location[facility] END[facility]COPY;  Service: Endoscopy;  Laterality: N/A;   ESOPHAGOGASTRODUODENOSCOPY N/A 04/21/2015   Procedure: ESOPHAGOGASTRODUODENOSCOPY (EGD);  Surgeon: Lollie Sails, MD;  Location: [facility] ENDOSCOPY;  Service: Endoscopy;  Laterality: N/A;   Esophgeal cancer     HAMMER TOE SURGERY Left 04/19/2017   Procedure: HAMMER TOE CORRECTION-3RD & 4TH;  Surgeon: Samara Deist, DPM;  Location: [facility];  Service: Podiatry;  Laterality: Left;   LOBECTOMY Right    METATARSAL OSTEOTOMY Left 04/19/2017   Procedure: PHALANX OSTEOTOMY-AKIN - LEFT;  Surgeon: Samara Deist, DPM;  Location: [facility];  Service: Podiatry;  Laterality: Left;   PARTIAL HYSTERECTOMY     SINUSOTOMY     Patient Active Problem List   Diagnosis Date Noted   Acute CVA (cerebrovascular accident) ([place]) 06/04/2021   Stroke ([place]) 06/03/2021   Hypertensive  urgency 06/03/2021   Sinus bradycardia 06/03/2021   Cancer of cecum ([place]) 10/04/2017   Normocytic anemia 10/04/2017   A-fib ([place]) 06/16/2015   Cancer of colon ([place]) 06/16/2015   DD (diverticular disease) 06/16/2015   Esophageal ulcer 06/16/2015   Hallux abducto valgus 06/16/2015   Hammer toe 06/16/2015   H/O varicella 06/16/2015   HK (hyperkeratosis) 06/16/2015   BP (high blood pressure) 06/16/2015   Neuritis or radiculitis due to rupture of lumbar intervertebral disc 06/16/2015   Cancer of lung ([place]) 06/16/2015   Degenerative  arthritis of spine 06/16/2015   SCC (squamous cell carcinoma of esophagus) (HCC) 06/16/2015   Supraventricular tachycardia ([place]) 06/16/2015   Temporary cerebral vascular dysfunction 06/16/2015   Familial multiple lipoprotein-type hyperlipidemia 03/27/2015   Clinical depression 03/27/2015   Acid reflux 03/27/2015   CN (constipation) 03/27/2015   Ulcerative cystitis 03/27/2015   Routine general medical examination at a health care facility 03/27/2015   Allergic rhinitis 03/27/2015   H/O malignant neoplasm 03/27/2015   Contusion of knee and lower leg 03/27/2015   Narrowing of intervertebral disc space 03/27/2015   DDD (degenerative disc disease), cervical 03/27/2015   DDD (degenerative disc disease), lumbar 03/27/2015   H/O: depression 03/27/2015   Can't get food down 03/27/2015   Essential (primary) hypertension 03/27/2015   Cephalalgia 03/27/2015   Discitis of lumbar region 03/27/2015   Blood in the urine 03/27/2015   H/O: HTN (hypertension) 03/27/2015   Menopause 03/27/2015   Screening for depression 03/27/2015   Sinus infection 03/27/2015   FOM (frequency of micturition) 03/27/2015   Arrhythmia, ventricular 03/27/2015   Encounter for mammogram to establish baseline mammogram 03/27/2015   Atrial tachycardia ([place]) 03/27/2015   Lumbar and sacral osteoarthritis 11/03/2014   Atrial fibrillation ([place]) 09/18/2014   Cancer of esophagus ([place]) 06/25/2014   Malignant neoplasm of esophagus ([place]) 06/25/2014    PCP: Sallee Lange, NP  REFERRING PROVIDER: Sallee Lange, NP  REFERRING DIAG:  Diagnosis  R26.81 (ICD-10-CM) - Unsteadiness on feet  M21.372 (ICD-10-CM) - Foot drop, left foot  Z86.73 (ICD-10-CM) - Personal history of transient ischemic attack (TIA), and cerebral infarction without residual deficits   THERAPY DIAG:  Gait difficulty  Muscle weakness (generalized)  Imbalance  Rationale for Evaluation and Treatment Rehabilitation  ONSET DATE:  11/21/2021  SUBJECTIVE:   SUBJECTIVE STATEMENT: Pt. Reports shopping for car today and doing some walking.  Pt. Brought in a SPC and walking stick to ensure proper sizing/ use.  Pt. States her R knee feels stiff/ achy while using Nustep but discomfort improved after several minutes of using Nustep.    PERTINENT HISTORY: Sallee Lange, NP - 05/04/2022 11:30 AM EDT Formatting of this note is different from the original. Images from the original note were not included. ID Data: Kelly Krause, 81 y.o. female  CC:  Chief Complaint  Patient presents with  Essie Christine 6 times in the last 6 months  Gait Problem  Unsteady gait  Foot Pain  Left foot pain from fall 2 weeks ago  Foot Swelling  Left foot swelling from fall 2 weeks ago  Mobility/balance Issues   HPI: Presents today with c/o unsteady gait, leading to ~6+ falls over the last 6 mon. Has been going on since had CVA last yr, but is getting worse. Last fall was ~2 wks ago & has Lt foot pain/swelling from this. Lt foot may drag some on the ground & cause her to fall. Tries to concentrate on lifting leg as  much as possible. Feels very unbalanced a lot. Has tried ice on Lt leg, which isn't helping swelling. Taking tylenol for pain, which may help. Lt foot & ankle hurt & Rt foot hurts. Does have both a cane & walking stick, but doesn't use either consistently.  PAIN:  Are you having pain? No  PRECAUTIONS: Fall  WEIGHT BEARING RESTRICTIONS No  FALLS:  Has patient fallen in last 6 months? No  LIVING ENVIRONMENT: Lives with: lives with their family and lives with their spouse Lives in: House/apartment Has following equipment at home: Single point cane  OCCUPATION: Retired  PLOF: Independent  PATIENT GOALS Decrease fall risk/ improve balance/ walking.     OBJECTIVE:  PATIENT SURVEYS:  FOTO initial 49/ goal 46.    COGNITION:  Overall cognitive status: Within functional limits for tasks  assessed     SENSATION: WFL  EDEMA:  Pitting edema in B lower legs.    POSTURE: rounded shoulders and forward head (minimal)- able to self-correct.   LOWER EXTREMITY ROM:  Active ROM Right eval Left eval  Hip flexion [facility]  Hip extension    Hip abduction [facility]  Hip adduction    Hip internal rotation    Hip external rotation    Knee flexion [facility] WFL  Knee extension [facility] WFL  Ankle dorsiflexion 0 deg. -2 deg.   Ankle plantarflexion [facility]  Ankle inversion    Ankle eversion     (Blank rows = not tested)  LOWER EXTREMITY MMT:  MMT Right eval Left eval  Hip flexion 4+/5 4/5  Hip extension    Hip abduction 4+/5 4/5  Hip adduction 5/5 4+/5  Hip internal rotation    Hip external rotation    Knee flexion 5/5 5/5  Knee extension 5/5 4+/5  Ankle dorsiflexion 4/5 4/5  Ankle plantarflexion 4/5 4/5  Ankle inversion    Ankle eversion     (Blank rows = not tested)  FUNCTIONAL TESTS:  Berg Balance Scale: 41/56 (high fall risk)- explained to patient.    GAIT: Distance walked: in clinic Assistive device utilized: Single point cane Level of assistance: Modified independence and SBA Comments: Pt. Benefits from use of SPC or walking stick to improve step pattern/ heel strike.     TODAY'S TREATMENT:    06/15/22:    Nustep L4 10 min. B UE/LE (discussed daily activities/ benefit of using SPC).      There.ex:    See HEP (light UE assist for safety).      Neuro:    Walking in clinic/ hallway with use of walking stick/SPC with a consistent 2-point gait pattern/ heel strike.  Cuing to increase hip flexion/ maintain BOS.    Walking cone taps in //-bars with use of B UE assist progressing to no UE assist.     Walking over hurdles in //-bars with no UE assist 3 laps.      Walking outside with use of walking stick on mulch/ grassy terrain/ sidewalk/ parking lot.  SBA for safety/ verbal cuing.  No LOB.   PATIENT EDUCATION:  Education details: Use of SPC/ Access Code:  [email or web address] Person educated: Patient Education method: Customer service manager Education comprehension: verbalized understanding and returned demonstration   HOME EXERCISE PROGRAM: Access Code: [email or web address] URL: [email or web address] Date: 06/15/2022 Prepared by: Dorcas Carrow  Exercises - Standing March with Unilateral Counter Support  - 1 x daily - 5 x weekly - 2 sets - 10 reps - Standing Hip Abduction with Unilateral Counter Support  -  1 x daily - 5 x weekly - 2 sets - 10 reps - Standing Tandem Balance with Counter Support  - 1 x daily - 5 x weekly - 2 sets - 10 reps - Mini Squat with Counter Support  - 1 x daily - 5 x weekly - 2 sets - 10 reps  ASSESSMENT:  CLINICAL IMPRESSION: Patient demonstrates good understanding/ technique with HEP.  No increase c/o pain reported during tx. Session and R knee discomfort resolved after several minutes on Nustep.  No LOB during tx. And pt. Challenged with cone taps without UE assist.  Pt. Instructed to use walking stick with all outside walking and SPC while in house to decrease fall risk.  No foot shuffling or drop foot noted while using assistive device.  Pt. Will benefit from skilled PT services to increase B hip/LE muscle strength to improve pain-free mobility/ decrease fall risk.     OBJECTIVE IMPAIRMENTS Abnormal gait, decreased activity tolerance, decreased balance, decreased coordination, decreased endurance, decreased mobility, difficulty walking, decreased ROM, decreased strength, increased edema, impaired flexibility, improper body mechanics, postural dysfunction, and pain.   ACTIVITY LIMITATIONS carrying, lifting, bending, standing, squatting, transfers, locomotion level, and caring for others  PARTICIPATION LIMITATIONS: cleaning, driving, community activity, and yard work  [place] Age, Education, Fitness, and Past/current experiences are also affecting patient's functional outcome.   REHAB POTENTIAL:  Good  CLINICAL DECISION MAKING: Evolving/moderate complexity  EVALUATION COMPLEXITY: Moderate   GOALS: Goals reviewed with patient? Yes  SHORT TERM GOALS: Target date: 07/04/22 Pt. Will be independent with HEP to increase hip flexor strength 1/2 muscle grade to improve step pattern/ decrease fall risk.   Baseline:  see HEP Goal status: INITIAL   LONG TERM GOALS: Target date: 07/25/22  Pt. Will increase FOTO score to 53 to improve pain-free mobility.   Baseline:  initial FOTO 49 Goal status: INITIAL  2.  Pt. Will increase Berg balance test to >48/56 to decrease fall risk/ improve functional mobility.   Baseline: Berg 41/56 Goal status: INITIAL  3.  Pt. Will ambulates with consistent 2-point gait pattern with use of SPC and report no falls consistently for while in PT.   Baseline: Pt. Reports numerous falls and is currently not using SPC Goal status: INITIAL   PLAN: PT FREQUENCY: 2x/week  PT DURATION: 6 weeks  PLANNED INTERVENTIONS: Therapeutic exercises, Therapeutic activity, Neuromuscular re-education, Balance training, Gait training, Patient/Family education, Self Care, Joint mobilization, Stair training, Cryotherapy, Moist heat, and Manual therapy  PLAN FOR NEXT SESSION: Dynamic balance tasks  Pura Spice, PT, DPT # [phone] 06/15/2022, 4:22 PM

## 2022-06-16 ENCOUNTER — Encounter: Payer: Medicare HMO | Admitting: Physical Therapy

## 2022-06-20 ENCOUNTER — Ambulatory Visit: Payer: Medicare HMO | Admitting: Physical Therapy

## 2022-06-20 ENCOUNTER — Encounter: Payer: Self-pay | Admitting: Physical Therapy

## 2022-06-20 DIAGNOSIS — R269 Unspecified abnormalities of gait and mobility: Secondary | ICD-10-CM | POA: Diagnosis not present

## 2022-06-20 DIAGNOSIS — R2689 Other abnormalities of gait and mobility: Secondary | ICD-10-CM

## 2022-06-20 DIAGNOSIS — M6281 Muscle weakness (generalized): Secondary | ICD-10-CM | POA: Diagnosis not present

## 2022-06-20 NOTE — Therapy (Signed)
OUTPATIENT PHYSICAL THERAPY LOWER EXTREMITY TREATMENT   Patient Name: Kelly Krause MRN: [id number] DOB:30-Sep-1941, 81 y.o., female Today's Date: 06/20/2022   PT End of Session - 06/20/22 1436     Visit Number 3    Number of Visits 12    Date for PT Re-Evaluation 07/25/22    PT Start Time 1429    PT Stop Time 1516    PT Time Calculation (min) 47 min    Equipment Utilized During Treatment Gait belt    Activity Tolerance Patient tolerated treatment well    Behavior During Therapy WFL for tasks assessed/performed             Past Medical History:  Diagnosis Date   Allergy    Atrial fibrillation ([place])    Cancer ([place])    esophagus/chemo and rad   Colon cancer ([place])    chemo/rad 15 yrs   Colon cancer ([place])    2 yrs ago   DDD (degenerative disc disease), cervical    Dysrhythmia    Elevated lipids    GERD (gastroesophageal reflux disease)    Hyperlipidemia    Hypertension    Lung cancer ([place])    chemo   Lung cancer ([place])    Major depressive disorder, recurrent episode, moderate ([place])    Persistent disorder of initiating or maintaining sleep    Personal history of chemotherapy    Personal history of radiation therapy    colon   Stroke ([place]) 06/03/2021   No Deficits   SVT (supraventricular tachycardia) ([place])    Past Surgical History:  Procedure Laterality Date   ABDOMINAL HYSTERECTOMY     ATRIAL FIBRILLATION ABLATION     BROW LIFT Bilateral 01/07/2022   Procedure: BLEPHAROPLASTY UPPER EYELID; W/ EXCESS SKIN BILATERAL;  Surgeon: Karle Starch, MD;  Location: [facility];  Service: Ophthalmology;  Laterality: Bilateral;   BUNIONECTOMY     CAPSULOTOMY METATARSOPHALANGEAL Left 04/19/2017   Procedure: CAPSULOTOMY METATARSOPHALANGEAL;  Surgeon: Samara Deist, DPM;  Location: [facility];  Service: Podiatry;  Laterality: Left;   CATARACT EXTRACTION W/PHACO Right 08/16/2021   Procedure: CATARACT EXTRACTION PHACO AND INTRAOCULAR LENS PLACEMENT (IOC) RIGHT  Eyhance Toric;  Surgeon: Eulogio Bear, MD;  Location: [facility];  Service: Ophthalmology;  Laterality: Right;  5.02 00:33.8   CATARACT EXTRACTION W/PHACO Left 08/30/2021   Procedure: CATARACT EXTRACTION PHACO AND INTRAOCULAR LENS PLACEMENT (IOC) LEFT 5.10 00:43.4;  Surgeon: Eulogio Bear, MD;  Location: [place] [facility];  Service: Ophthalmology;  Laterality: Left;   COLECTOMY     COLONOSCOPY WITH PROPOFOL N/A 09/26/2017   Procedure: COLONOSCOPY WITH PROPOFOL;  Surgeon: Lollie Sails, MD;  [facility] ENDOSCOPY;  Service: Endoscopy;  Laterality: N/A;   ESOPHAGOGASTRODUODENOSCOPY N/A 04/21/2015   Procedure: ESOPHAGOGASTRODUODENOSCOPY (EGD);  Surgeon: Lollie Sails, MD;  Location: [facility] ENDOSCOPY;  Service: Endoscopy;  Laterality: N/A;   Esophgeal cancer     HAMMER TOE SURGERY Left 04/19/2017   Procedure: HAMMER TOE CORRECTION-3RD & 4TH;  Surgeon: Samara Deist, DPM;  Location: [place];  Service: Podiatry;  Laterality: Left;   LOBECTOMY Right    METATARSAL OSTEOTOMY Left 04/19/2017   Procedure: PHALANX OSTEOTOMY-AKIN - LEFT;  Surgeon: Samara Deist, DPM;  Location: [facility];  Service: Podiatry;  Laterality: Left;   PARTIAL HYSTERECTOMY     SINUSOTOMY     Patient Active Problem List   Diagnosis Date Noted   Acute CVA (cerebrovascular accident) ([place]) 06/04/2021   Stroke ([place]) 06/03/2021   Hypertensive  urgency 06/03/2021   Sinus bradycardia 06/03/2021   Cancer of cecum ([place]) 10/04/2017   Normocytic anemia 10/04/2017   A-fib ([place]) 06/16/2015   Cancer of colon ([place]) 06/16/2015   DD (diverticular disease) 06/16/2015   Esophageal ulcer 06/16/2015   Hallux abducto valgus 06/16/2015   Hammer toe 06/16/2015   H/O varicella 06/16/2015   HK (hyperkeratosis) 06/16/2015   BP (high blood pressure) 06/16/2015   Neuritis or radiculitis due to rupture of lumbar intervertebral disc 06/16/2015   Cancer of lung ([place]) 06/16/2015   Degenerative  arthritis of spine 06/16/2015   SCC (squamous cell carcinoma of esophagus) (HCC) 06/16/2015   Supraventricular tachycardia ([place]) 06/16/2015   Temporary cerebral vascular dysfunction 06/16/2015   Familial multiple lipoprotein-type hyperlipidemia 03/27/2015   Clinical depression 03/27/2015   Acid reflux 03/27/2015   CN (constipation) 03/27/2015   Ulcerative cystitis 03/27/2015   Routine general medical examination at a health care facility 03/27/2015   Allergic rhinitis 03/27/2015   H/O malignant neoplasm 03/27/2015   Contusion of knee and lower leg 03/27/2015   Narrowing of intervertebral disc space 03/27/2015   DDD (degenerative disc disease), cervical 03/27/2015   DDD (degenerative disc disease), lumbar 03/27/2015   H/O: depression 03/27/2015   Can't get food down 03/27/2015   Essential (primary) hypertension 03/27/2015   Cephalalgia 03/27/2015   Discitis of lumbar region 03/27/2015   Blood in the urine 03/27/2015   H/O: HTN (hypertension) 03/27/2015   Menopause 03/27/2015   Screening for depression 03/27/2015   Sinus infection 03/27/2015   FOM (frequency of micturition) 03/27/2015   Arrhythmia, ventricular 03/27/2015   Encounter for mammogram to establish baseline mammogram 03/27/2015   Atrial tachycardia ([place]) 03/27/2015   Lumbar and sacral osteoarthritis 11/03/2014   Atrial fibrillation ([place]) 09/18/2014   Cancer of esophagus ([place]) 06/25/2014   Malignant neoplasm of esophagus ([place]) 06/25/2014    PCP: Sallee Lange, NP  REFERRING PROVIDER: Sallee Lange, NP  REFERRING DIAG:  Diagnosis  R26.81 (ICD-10-CM) - Unsteadiness on feet  M21.372 (ICD-10-CM) - Foot drop, left foot  Z86.73 (ICD-10-CM) - Personal history of transient ischemic attack (TIA), and cerebral infarction without residual deficits   THERAPY DIAG:  Gait difficulty  Muscle weakness (generalized)  Imbalance  Rationale for Evaluation and Treatment Rehabilitation  ONSET DATE:  11/21/2021  SUBJECTIVE:   SUBJECTIVE STATEMENT: Pt. Forgot SPC today.  Pt. States she has been really busy today.  No falls or pain reported today.  Pt. Reports her main issue is balance.    PERTINENT HISTORY: Sallee Lange, NP - 05/04/2022 11:30 AM EDT Formatting of this note is different from the original. Images from the original note were not included. ID Data: Moshe Cipro, 81 y.o. female  CC:  Chief Complaint  Patient presents with  Essie Christine 6 times in the last 6 months  Gait Problem  Unsteady gait  Foot Pain  Left foot pain from fall 2 weeks ago  Foot Swelling  Left foot swelling from fall 2 weeks ago  Mobility/balance Issues   HPI: Presents today with c/o unsteady gait, leading to ~6+ falls over the last 6 mon. Has been going on since had CVA last yr, but is getting worse. Last fall was ~2 wks ago & has Lt foot pain/swelling from this. Lt foot may drag some on the ground & cause her to fall. Tries to concentrate on lifting leg as much as possible. Feels very unbalanced a lot. Has tried ice on Lt leg, which isn't helping  swelling. Taking tylenol for pain, which may help. Lt foot & ankle hurt & Rt foot hurts. Does have both a cane & walking stick, but doesn't use either consistently.  PAIN:  Are you having pain? No  PRECAUTIONS: Fall  WEIGHT BEARING RESTRICTIONS No  FALLS:  Has patient fallen in last 6 months? No  LIVING ENVIRONMENT: Lives with: lives with their family and lives with their spouse Lives in: House/apartment Has following equipment at home: Single point cane  OCCUPATION: Retired  PLOF: Independent  PATIENT GOALS Decrease fall risk/ improve balance/ walking.     OBJECTIVE:  PATIENT SURVEYS:  FOTO initial 49/ goal 85.    COGNITION:  Overall cognitive status: Within functional limits for tasks assessed     SENSATION: WFL  EDEMA:  Pitting edema in B lower legs.    POSTURE: rounded shoulders and forward head (minimal)- able  to self-correct.   LOWER EXTREMITY ROM:  Active ROM Right eval Left eval  Hip flexion [facility]  Hip extension    Hip abduction [facility]  Hip adduction    Hip internal rotation    Hip external rotation    Knee flexion [facility] WFL  Knee extension [facility] WFL  Ankle dorsiflexion 0 deg. -2 deg.   Ankle plantarflexion [facility]  Ankle inversion    Ankle eversion     (Blank rows = not tested)  LOWER EXTREMITY MMT:  MMT Right eval Left eval  Hip flexion 4+/5 4/5  Hip extension    Hip abduction 4+/5 4/5  Hip adduction 5/5 4+/5  Hip internal rotation    Hip external rotation    Knee flexion 5/5 5/5  Knee extension 5/5 4+/5  Ankle dorsiflexion 4/5 4/5  Ankle plantarflexion 4/5 4/5  Ankle inversion    Ankle eversion     (Blank rows = not tested)  FUNCTIONAL TESTS:  Berg Balance Scale: 41/56 (high fall risk)- explained to patient.    GAIT: Distance walked: in clinic Assistive device utilized: Single point cane Level of assistance: Modified independence and SBA Comments: Pt. Benefits from use of SPC or walking stick to improve step pattern/ heel strike.     TODAY'S TREATMENT:  06/20/22:  Nustep L5 10 min. B UE/LE.  Discussed weekend activities/ use of SPC).    Neuro:  Walking in //-bars: 3" plinth/ 6" hurdles recip. Step overs with B UE assist progressing to L UE only.  8 laps.  Pt. Reports R LE aching.  Cuing to correct R hip circumduction with step through.    Tandem Airex: lateral walking 5 laps (light UE assist for safety/ required).  6"/12" recip. Step touches with heel and toe strike (light UE assist as needed)- 20x.  BOSU step ups/ static holds in //-bars (SBA/CGA)- light UE as needed.      Walking in hallway with alt. UE/ LE touches.  Braiding in hallway L/R.    Walking outside on sidewalk/ curb/ ramp.  No issues.          06/15/22:    Nustep L4 10 min. B UE/LE (discussed daily activities/ benefit of using SPC).      There.ex:    See HEP (light UE assist  for safety).      Neuro:    Walking in clinic/ hallway with use of walking stick/SPC with a consistent 2-point gait pattern/ heel strike.  Cuing to increase hip flexion/ maintain BOS.    Walking cone taps in //-bars with use of B UE assist progressing to  no UE assist.     Walking over hurdles in //-bars with no UE assist 3 laps.      Walking outside with use of walking stick on mulch/ grassy terrain/ sidewalk/ parking lot.  SBA for safety/ verbal cuing.  No LOB.   PATIENT EDUCATION:  Education details: Use of SPC/ Access Code: [email or web address] Person educated: Patient Education method: Customer service manager Education comprehension: verbalized understanding and returned demonstration   HOME EXERCISE PROGRAM: Access Code: [email or web address] URL: [email or web address] Date: 06/15/2022 Prepared by: Dorcas Carrow  Exercises - Standing March with Unilateral Counter Support  - 1 x daily - 5 x weekly - 2 sets - 10 reps - Standing Hip Abduction with Unilateral Counter Support  - 1 x daily - 5 x weekly - 2 sets - 10 reps - Standing Tandem Balance with Counter Support  - 1 x daily - 5 x weekly - 2 sets - 10 reps - Mini Squat with Counter Support  - 1 x daily - 5 x weekly - 2 sets - 10 reps  ASSESSMENT:  CLINICAL IMPRESSION: No increase c/o pain reported during tx. Session but R LE muscle fatigue after braiding tasks in hallway.  No LOB during tx. And pt. Challenged with braiding without UE assist and required light UE assist on wall.  Pt. Instructed to use walking stick with all outside walking and SPC while in house to decrease fall risk.  No foot shuffling or drop foot noted while using assistive device.  Pt. Will benefit from skilled PT services to increase B hip/LE muscle strength to improve pain-free mobility/ decrease fall risk.     OBJECTIVE IMPAIRMENTS Abnormal gait, decreased activity tolerance, decreased balance, decreased coordination, decreased endurance, decreased mobility,  difficulty walking, decreased ROM, decreased strength, increased edema, impaired flexibility, improper body mechanics, postural dysfunction, and pain.   ACTIVITY LIMITATIONS carrying, lifting, bending, standing, squatting, transfers, locomotion level, and caring for others  PARTICIPATION LIMITATIONS: cleaning, driving, community activity, and yard work  [place] Age, Education, Fitness, and Past/current experiences are also affecting patient's functional outcome.   REHAB POTENTIAL: Good  CLINICAL DECISION MAKING: Evolving/moderate complexity  EVALUATION COMPLEXITY: Moderate   GOALS: Goals reviewed with patient? Yes  SHORT TERM GOALS: Target date: 07/04/22 Pt. Will be independent with HEP to increase hip flexor strength 1/2 muscle grade to improve step pattern/ decrease fall risk.   Baseline:  see HEP Goal status: INITIAL   LONG TERM GOALS: Target date: 07/25/22  Pt. Will increase FOTO score to 53 to improve pain-free mobility.   Baseline:  initial FOTO 49 Goal status: INITIAL  2.  Pt. Will increase Berg balance test to >48/56 to decrease fall risk/ improve functional mobility.   Baseline: Berg 41/56 Goal status: INITIAL  3.  Pt. Will ambulates with consistent 2-point gait pattern with use of SPC and report no falls consistently for while in PT.   Baseline: Pt. Reports numerous falls and is currently not using SPC Goal status: INITIAL   PLAN: PT FREQUENCY: 2x/week  PT DURATION: 6 weeks  PLANNED INTERVENTIONS: Therapeutic exercises, Therapeutic activity, Neuromuscular re-education, Balance training, Gait training, Patient/Family education, Self Care, Joint mobilization, Stair training, Cryotherapy, Moist heat, and Manual therapy  PLAN FOR NEXT SESSION: Dynamic balance tasks  Pura Spice, PT, DPT # [phone] 06/20/2022, 3:17 PM

## 2022-06-21 ENCOUNTER — Encounter: Payer: Medicare HMO | Admitting: Physical Therapy

## 2022-06-22 ENCOUNTER — Encounter: Payer: Self-pay | Admitting: Physical Therapy

## 2022-06-22 ENCOUNTER — Ambulatory Visit: Payer: Medicare HMO | Attending: Nurse Practitioner | Admitting: Physical Therapy

## 2022-06-22 DIAGNOSIS — R2689 Other abnormalities of gait and mobility: Secondary | ICD-10-CM | POA: Diagnosis not present

## 2022-06-22 DIAGNOSIS — M6281 Muscle weakness (generalized): Secondary | ICD-10-CM | POA: Insufficient documentation

## 2022-06-22 DIAGNOSIS — R269 Unspecified abnormalities of gait and mobility: Secondary | ICD-10-CM | POA: Diagnosis not present

## 2022-06-22 NOTE — Therapy (Signed)
OUTPATIENT PHYSICAL THERAPY LOWER EXTREMITY TREATMENT   Patient Name: Kelly Krause MRN: [id number] DOB:1940/12/11, 81 y.o., female Today's Date: 06/22/2022   PT End of Session - 06/22/22 1445     Visit Number 4    Number of Visits 12    Date for PT Re-Evaluation 07/25/22    PT Start Time 1660    Equipment Utilized During Treatment Gait belt    Activity Tolerance Patient tolerated treatment well    Behavior During Therapy [facility] for tasks assessed/performed            1431 to 1515  (44 min.).      Past Medical History:  Diagnosis Date   Allergy    Atrial fibrillation ([place])    Cancer ([place])    esophagus/chemo and rad   Colon cancer ([place])    chemo/rad 15 yrs   Colon cancer ([place])    2 yrs ago   DDD (degenerative disc disease), cervical    Dysrhythmia    Elevated lipids    GERD (gastroesophageal reflux disease)    Hyperlipidemia    Hypertension    Lung cancer (HCC)    chemo   Lung cancer ([place])    Major depressive disorder, recurrent episode, moderate (HCC)    Persistent disorder of initiating or maintaining sleep    Personal history of chemotherapy    Personal history of radiation therapy    colon   Stroke ([place]) 06/03/2021   No Deficits   SVT (supraventricular tachycardia) ([place])    Past Surgical History:  Procedure Laterality Date   ABDOMINAL HYSTERECTOMY     ATRIAL FIBRILLATION ABLATION     BROW LIFT Bilateral 01/07/2022   Procedure: BLEPHAROPLASTY UPPER EYELID; W/ EXCESS SKIN BILATERAL;  Surgeon: Karle Starch, MD;  Location: [place];  Service: Ophthalmology;  Laterality: Bilateral;   BUNIONECTOMY     CAPSULOTOMY METATARSOPHALANGEAL Left 04/19/2017   Procedure: CAPSULOTOMY METATARSOPHALANGEAL;  Surgeon: Samara Deist, DPM;  Location: [place];  Service: Podiatry;  Laterality: Left;   CATARACT EXTRACTION W/PHACO Right 08/16/2021   Procedure: CATARACT EXTRACTION PHACO AND INTRAOCULAR LENS PLACEMENT (IOC) RIGHT Eyhance Toric;  Surgeon: Eulogio Bear, MD;  Location: [place];  Service: Ophthalmology;  Laterality: Right;  5.02 00:33.8   CATARACT EXTRACTION W/PHACO Left 08/30/2021   Procedure: CATARACT EXTRACTION PHACO AND INTRAOCULAR LENS PLACEMENT (IOC) LEFT 5.10 00:43.4;  Surgeon: Eulogio Bear, MD;  Location: [place];  Service: Ophthalmology;  Laterality: Left;   COLECTOMY     COLONOSCOPY WITH PROPOFOL N/A 09/26/2017   Procedure: COLONOSCOPY WITH PROPOFOL;  Surgeon: Lollie Sails, MD;  Location[facility] ENDOSCOPY;  Service: Endoscopy;  Laterality: N/A;   ESOPHAGOGASTRODUODENOSCOPY N/A 04/21/2015   Procedure: ESOPHAGOGASTRODUODENOSCOPY (EGD);  Surgeon: Lollie Sails, MD;  Location: [facility] ENDOSCOPY;  Service: Endoscopy;  Laterality: N/A;   Esophgeal cancer     HAMMER TOE SURGERY Left 04/19/2017   Procedure: HAMMER TOE CORRECTION-3RD & 4TH;  Surgeon: Samara Deist, DPM;  Location: [place];  Service: Podiatry;  Laterality: Left;   LOBECTOMY Right    METATARSAL OSTEOTOMY Left 04/19/2017   Procedure: PHALANX OSTEOTOMY-AKIN - LEFT;  Surgeon: Samara Deist, DPM;  Location: [place];  Service: Podiatry;  Laterality: Left;   PARTIAL HYSTERECTOMY     SINUSOTOMY     Patient Active Problem List   Diagnosis Date Noted   Acute CVA (cerebrovascular accident) ([place]) 06/04/2021   Stroke ([place]) 06/03/2021   Hypertensive urgency 06/03/2021   Sinus bradycardia  06/03/2021   Cancer of cecum ([place]) 10/04/2017   Normocytic anemia 10/04/2017   A-fib ([place]) 06/16/2015   Cancer of colon ([place]) 06/16/2015   DD (diverticular disease) 06/16/2015   Esophageal ulcer 06/16/2015   Hallux abducto valgus 06/16/2015   Hammer toe 06/16/2015   H/O varicella 06/16/2015   HK (hyperkeratosis) 06/16/2015   BP (high blood pressure) 06/16/2015   Neuritis or radiculitis due to rupture of lumbar intervertebral disc 06/16/2015   Cancer of lung ([place]) 06/16/2015   Degenerative arthritis of spine 06/16/2015   SCC  (squamous cell carcinoma of esophagus) ([place]) 06/16/2015   Supraventricular tachycardia ([place]) 06/16/2015   Temporary cerebral vascular dysfunction 06/16/2015   Familial multiple lipoprotein-type hyperlipidemia 03/27/2015   Clinical depression 03/27/2015   Acid reflux 03/27/2015   CN (constipation) 03/27/2015   Ulcerative cystitis 03/27/2015   Routine general medical examination at a health care facility 03/27/2015   Allergic rhinitis 03/27/2015   H/O malignant neoplasm 03/27/2015   Contusion of knee and lower leg 03/27/2015   Narrowing of intervertebral disc space 03/27/2015   DDD (degenerative disc disease), cervical 03/27/2015   DDD (degenerative disc disease), lumbar 03/27/2015   H/O: depression 03/27/2015   Can't get food down 03/27/2015   Essential (primary) hypertension 03/27/2015   Cephalalgia 03/27/2015   Discitis of lumbar region 03/27/2015   Blood in the urine 03/27/2015   H/O: HTN (hypertension) 03/27/2015   Menopause 03/27/2015   Screening for depression 03/27/2015   Sinus infection 03/27/2015   FOM (frequency of micturition) 03/27/2015   Arrhythmia, ventricular 03/27/2015   Encounter for mammogram to establish baseline mammogram 03/27/2015   Atrial tachycardia ([place]) 03/27/2015   Lumbar and sacral osteoarthritis 11/03/2014   Atrial fibrillation ([place]) 09/18/2014   Cancer of esophagus ([place]) 06/25/2014   Malignant neoplasm of esophagus ([place]) 06/25/2014    PCP: Sallee Lange, NP  REFERRING PROVIDER: Sallee Lange, NP  REFERRING DIAG:  Diagnosis  R26.81 (ICD-10-CM) - Unsteadiness on feet  M21.372 (ICD-10-CM) - Foot drop, left foot  Z86.73 (ICD-10-CM) - Personal history of transient ischemic attack (TIA), and cerebral infarction without residual deficits   THERAPY DIAG:  Gait difficulty  Muscle weakness (generalized)  Imbalance  Rationale for Evaluation and Treatment Rehabilitation  ONSET DATE: 11/21/2021  SUBJECTIVE:   SUBJECTIVE  STATEMENT: Pt. Reports doing yard work yesterday and had to occasionally hold on to arbor at window for balance.  No falls but pt. Reports B LE were really tired last night.      PERTINENT HISTORY: Sallee Lange, NP - 05/04/2022 11:30 AM EDT Formatting of this note is different from the original. Images from the original note were not included. ID Data: Moshe Cipro, 81 y.o. female  CC:  Chief Complaint  Patient presents with  Essie Christine 6 times in the last 6 months  Gait Problem  Unsteady gait  Foot Pain  Left foot pain from fall 2 weeks ago  Foot Swelling  Left foot swelling from fall 2 weeks ago  Mobility/balance Issues   HPI: Presents today with c/o unsteady gait, leading to ~6+ falls over the last 6 mon. Has been going on since had CVA last yr, but is getting worse. Last fall was ~2 wks ago & has Lt foot pain/swelling from this. Lt foot may drag some on the ground & cause her to fall. Tries to concentrate on lifting leg as much as possible. Feels very unbalanced a lot. Has tried ice on Lt leg, which isn't helping swelling.  Taking tylenol for pain, which may help. Lt foot & ankle hurt & Rt foot hurts. Does have both a cane & walking stick, but doesn't use either consistently.  PAIN:  Are you having pain? No  PRECAUTIONS: Fall  WEIGHT BEARING RESTRICTIONS No  FALLS:  Has patient fallen in last 6 months? No  LIVING ENVIRONMENT: Lives with: lives with their family and lives with their spouse Lives in: House/apartment Has following equipment at home: Single point cane  OCCUPATION: Retired  PLOF: Independent  PATIENT GOALS Decrease fall risk/ improve balance/ walking.     OBJECTIVE:  PATIENT SURVEYS:  FOTO initial 49/ goal 66.    COGNITION:  Overall cognitive status: Within functional limits for tasks assessed     SENSATION: WFL  EDEMA:  Pitting edema in B lower legs.    POSTURE: rounded shoulders and forward head (minimal)- able to  self-correct.   LOWER EXTREMITY ROM:  Active ROM Right eval Left eval  Hip flexion [facility]  Hip extension    Hip abduction [facility]  Hip adduction    Hip internal rotation    Hip external rotation    Knee flexion [facility] WFL  Knee extension [facility] WFL  Ankle dorsiflexion 0 deg. -2 deg.   Ankle plantarflexion [facility]  Ankle inversion    Ankle eversion     (Blank rows = not tested)  LOWER EXTREMITY MMT:  MMT Right eval Left eval  Hip flexion 4+/5 4/5  Hip extension    Hip abduction 4+/5 4/5  Hip adduction 5/5 4+/5  Hip internal rotation    Hip external rotation    Knee flexion 5/5 5/5  Knee extension 5/5 4+/5  Ankle dorsiflexion 4/5 4/5  Ankle plantarflexion 4/5 4/5  Ankle inversion    Ankle eversion     (Blank rows = not tested)  FUNCTIONAL TESTS:  Berg Balance Scale: 41/56 (high fall risk)- explained to patient.    GAIT: Distance walked: in clinic Assistive device utilized: Single point cane Level of assistance: Modified independence and SBA Comments: Pt. Benefits from use of SPC or walking stick to improve step pattern/ heel strike.     TODAY'S TREATMENT:  06/22/22:  Nustep L5 10 min. B UE/LE.     STS from blue mat table (varying heights)- no UE assist with quad muscle focus.  Reviewed HEP  Neuro:  Walking at agility ladder with recip. Step pattern over 6" hurdles/ cone taps.  With and without use of SPC.    Tandem gait: forward/ backwards walking in //-bars with mirror feedback (Light UE required for safety).    6"/12" recip. Step touches with heel and toe strike (light UE assist as needed)- 20x.   Pt. Had L foot scuffing step due to limited hip flexion.    //-bar walking: high marching with B UE assist progressing to no UE assist 8 laps.  Lateral walking with increase hip flexion/ upright posture.   Walking in hallway with alt. UE/ LE touches.  Braiding in hallway L/R.    Walking outside on sidewalk/ curb/ ramp.       PATIENT EDUCATION:  Education  details: Use of SPC/ Access Code: [email or web address] Person educated: Patient Education method: Customer service manager Education comprehension: verbalized understanding and returned demonstration   HOME EXERCISE PROGRAM: Access Code: [email or web address] URL: [email or web address] Date: 06/15/2022 Prepared by: Dorcas Carrow  Exercises - Standing March with Unilateral Counter Support  - 1 x daily - 5 x weekly - 2 sets - 10 reps -  Standing Hip Abduction with Unilateral Counter Support  - 1 x daily - 5 x weekly - 2 sets - 10 reps - Standing Tandem Balance with Counter Support  - 1 x daily - 5 x weekly - 2 sets - 10 reps - Mini Squat with Counter Support  - 1 x daily - 5 x weekly - 2 sets - 10 reps  ASSESSMENT:  CLINICAL IMPRESSION: No increase c/o pain reported during tx. Session but R LE muscle fatigue after 12: step touches and  braiding tasks in hallway.  Pt. Continues to benefit from use of SPC or walking stick with all walking to improve gait pattern/ decrease antalgic stepping.  No foot shuffling or drop foot noted while using assistive device.  Pt. Will benefit from skilled PT services to increase B hip/LE muscle strength to improve pain-free mobility/ decrease fall risk.     OBJECTIVE IMPAIRMENTS Abnormal gait, decreased activity tolerance, decreased balance, decreased coordination, decreased endurance, decreased mobility, difficulty walking, decreased ROM, decreased strength, increased edema, impaired flexibility, improper body mechanics, postural dysfunction, and pain.   ACTIVITY LIMITATIONS carrying, lifting, bending, standing, squatting, transfers, locomotion level, and caring for others  PARTICIPATION LIMITATIONS: cleaning, driving, community activity, and yard work  [place] Age, Education, Fitness, and Past/current experiences are also affecting patient's functional outcome.   REHAB POTENTIAL: Good  CLINICAL DECISION MAKING: Evolving/moderate  complexity  EVALUATION COMPLEXITY: Moderate   GOALS: Goals reviewed with patient? Yes  SHORT TERM GOALS: Target date: 07/04/22 Pt. Will be independent with HEP to increase hip flexor strength 1/2 muscle grade to improve step pattern/ decrease fall risk.   Baseline:  see HEP Goal status: INITIAL   LONG TERM GOALS: Target date: 07/25/22  Pt. Will increase FOTO score to 53 to improve pain-free mobility.   Baseline:  initial FOTO 49 Goal status: INITIAL  2.  Pt. Will increase Berg balance test to >48/56 to decrease fall risk/ improve functional mobility.   Baseline: Berg 41/56 Goal status: INITIAL  3.  Pt. Will ambulates with consistent 2-point gait pattern with use of SPC and report no falls consistently for while in PT.   Baseline: Pt. Reports numerous falls and is currently not using SPC Goal status: INITIAL   PLAN: PT FREQUENCY: 2x/week  PT DURATION: 6 weeks  PLANNED INTERVENTIONS: Therapeutic exercises, Therapeutic activity, Neuromuscular re-education, Balance training, Gait training, Patient/Family education, Self Care, Joint mobilization, Stair training, Cryotherapy, Moist heat, and Manual therapy  PLAN FOR NEXT SESSION: Dynamic balance tasks  Pura Spice, PT, DPT # [phone] 06/22/2022, 2:45 PM

## 2022-06-23 ENCOUNTER — Encounter: Payer: Medicare HMO | Admitting: Physical Therapy

## 2022-06-27 ENCOUNTER — Encounter: Payer: Self-pay | Admitting: Physical Therapy

## 2022-06-27 ENCOUNTER — Ambulatory Visit: Payer: Medicare HMO | Admitting: Physical Therapy

## 2022-06-27 DIAGNOSIS — R2689 Other abnormalities of gait and mobility: Secondary | ICD-10-CM | POA: Diagnosis not present

## 2022-06-27 DIAGNOSIS — M6281 Muscle weakness (generalized): Secondary | ICD-10-CM

## 2022-06-27 DIAGNOSIS — R269 Unspecified abnormalities of gait and mobility: Secondary | ICD-10-CM | POA: Diagnosis not present

## 2022-06-27 NOTE — Therapy (Signed)
OUTPATIENT PHYSICAL THERAPY LOWER EXTREMITY TREATMENT   Patient Name: Kelly Krause MRN: [id number] DOB:1941/09/09, 81 y.o., female Today's Date: 06/27/2022   PT End of Session - 06/27/22 1428     Visit Number 5    Number of Visits 12    Date for PT Re-Evaluation 07/25/22    PT Start Time 1429    PT Stop Time 1514    PT Time Calculation (min) 45 min    Equipment Utilized During Treatment Gait belt    Activity Tolerance Patient tolerated treatment well    Behavior During Therapy WFL for tasks assessed/performed             Past Medical History:  Diagnosis Date   Allergy    Atrial fibrillation ([place])    Cancer ([place])    esophagus/chemo and rad   Colon cancer ([place])    chemo/rad 15 yrs   Colon cancer ([place])    2 yrs ago   DDD (degenerative disc disease), cervical    Dysrhythmia    Elevated lipids    GERD (gastroesophageal reflux disease)    Hyperlipidemia    Hypertension    Lung cancer ([place])    chemo   Lung cancer ([place])    Major depressive disorder, recurrent episode, moderate ([place])    Persistent disorder of initiating or maintaining sleep    Personal history of chemotherapy    Personal history of radiation therapy    colon   Stroke ([place]) 06/03/2021   No Deficits   SVT (supraventricular tachycardia) ([place])    Past Surgical History:  Procedure Laterality Date   ABDOMINAL HYSTERECTOMY     ATRIAL FIBRILLATION ABLATION     BROW LIFT Bilateral 01/07/2022   Procedure: BLEPHAROPLASTY UPPER EYELID; W/ EXCESS SKIN BILATERAL;  Surgeon: Karle Starch, MD;  Location: [facility];  Service: Ophthalmology;  Laterality: Bilateral;   BUNIONECTOMY     CAPSULOTOMY METATARSOPHALANGEAL Left 04/19/2017   Procedure: CAPSULOTOMY METATARSOPHALANGEAL;  Surgeon: Samara Deist, DPM;  Location: [facility];  Service: Podiatry;  Laterality: Left;   CATARACT EXTRACTION W/PHACO Right 08/16/2021   Procedure: CATARACT EXTRACTION PHACO AND INTRAOCULAR LENS PLACEMENT (IOC) RIGHT  Eyhance Toric;  Surgeon: Eulogio Bear, MD;  Location: [facility];  Service: Ophthalmology;  Laterality: Right;  5.02 00:33.8   CATARACT EXTRACTION W/PHACO Left 08/30/2021   Procedure: CATARACT EXTRACTION PHACO AND INTRAOCULAR LENS PLACEMENT (IOC) LEFT 5.10 00:43.4;  Surgeon: Eulogio Bear, MD;  Location: [facility];  Service: Ophthalmology;  Laterality: Left;   COLECTOMY     COLONOSCOPY WITH PROPOFOL N/A 09/26/2017   Procedure: COLONOSCOPY WITH PROPOFOL;  Surgeon: Lollie Sails, MD;  Location[facility] ENDOSCOPY;  Service: Endoscopy;  Laterality: N/A;   ESOPHAGOGASTRODUODENOSCOPY N/A 04/21/2015   Procedure: ESOPHAGOGASTRODUODENOSCOPY (EGD);  Surgeon: Lollie Sails, MD;  Location: [facility] ENDOSCOPY;  Service: Endoscopy;  Laterality: N/A;   Esophgeal cancer     HAMMER TOE SURGERY Left 04/19/2017   Procedure: HAMMER TOE CORRECTION-3RD & 4TH;  Surgeon: Samara Deist, DPM;  Location: [facility];  Service: Podiatry;  Laterality: Left;   LOBECTOMY Right    METATARSAL OSTEOTOMY Left 04/19/2017   Procedure: PHALANX OSTEOTOMY-AKIN - LEFT;  Surgeon: Samara Deist, DPM;  Location: [facility];  Service: Podiatry;  Laterality: Left;   PARTIAL HYSTERECTOMY     SINUSOTOMY     Patient Active Problem List   Diagnosis Date Noted   Acute CVA (cerebrovascular accident) ([place]) 06/04/2021   Stroke ([place]) 06/03/2021   Hypertensive  urgency 06/03/2021   Sinus bradycardia 06/03/2021   Cancer of cecum ([place]) 10/04/2017   Normocytic anemia 10/04/2017   A-fib ([place]) 06/16/2015   Cancer of colon ([place]) 06/16/2015   DD (diverticular disease) 06/16/2015   Esophageal ulcer 06/16/2015   Hallux abducto valgus 06/16/2015   Hammer toe 06/16/2015   H/O varicella 06/16/2015   HK (hyperkeratosis) 06/16/2015   BP (high blood pressure) 06/16/2015   Neuritis or radiculitis due to rupture of lumbar intervertebral disc 06/16/2015   Cancer of lung ([place]) 06/16/2015   Degenerative  arthritis of spine 06/16/2015   SCC (squamous cell carcinoma of esophagus) (HCC) 06/16/2015   Supraventricular tachycardia ([place]) 06/16/2015   Temporary cerebral vascular dysfunction 06/16/2015   Familial multiple lipoprotein-type hyperlipidemia 03/27/2015   Clinical depression 03/27/2015   Acid reflux 03/27/2015   CN (constipation) 03/27/2015   Ulcerative cystitis 03/27/2015   Routine general medical examination at a health care facility 03/27/2015   Allergic rhinitis 03/27/2015   H/O malignant neoplasm 03/27/2015   Contusion of knee and lower leg 03/27/2015   Narrowing of intervertebral disc space 03/27/2015   DDD (degenerative disc disease), cervical 03/27/2015   DDD (degenerative disc disease), lumbar 03/27/2015   H/O: depression 03/27/2015   Can't get food down 03/27/2015   Essential (primary) hypertension 03/27/2015   Cephalalgia 03/27/2015   Discitis of lumbar region 03/27/2015   Blood in the urine 03/27/2015   H/O: HTN (hypertension) 03/27/2015   Menopause 03/27/2015   Screening for depression 03/27/2015   Sinus infection 03/27/2015   FOM (frequency of micturition) 03/27/2015   Arrhythmia, ventricular 03/27/2015   Encounter for mammogram to establish baseline mammogram 03/27/2015   Atrial tachycardia ([place]) 03/27/2015   Lumbar and sacral osteoarthritis 11/03/2014   Atrial fibrillation ([place]) 09/18/2014   Cancer of esophagus ([place]) 06/25/2014   Malignant neoplasm of esophagus ([place]) 06/25/2014    PCP: Sallee Lange, NP  REFERRING PROVIDER: Sallee Lange, NP  REFERRING DIAG:  Diagnosis  R26.81 (ICD-10-CM) - Unsteadiness on feet  M21.372 (ICD-10-CM) - Foot drop, left foot  Z86.73 (ICD-10-CM) - Personal history of transient ischemic attack (TIA), and cerebral infarction without residual deficits   THERAPY DIAG:  Gait difficulty  Muscle weakness (generalized)  Imbalance  Rationale for Evaluation and Treatment Rehabilitation  ONSET DATE:  11/21/2021  SUBJECTIVE:   SUBJECTIVE STATEMENT: Pt. States she is tired today.  Pt. Washed some walls today and did some yardwork.  No falls reported.  No pain reported.  Pt. Entered PT with use of SPC today.    PERTINENT HISTORY: Sallee Lange, NP - 05/04/2022 11:30 AM EDT Formatting of this note is different from the original. Images from the original note were not included. ID Data: Kelly Krause, 81 y.o. female  CC:  Chief Complaint  Patient presents with  Essie Christine 6 times in the last 6 months  Gait Problem  Unsteady gait  Foot Pain  Left foot pain from fall 2 weeks ago  Foot Swelling  Left foot swelling from fall 2 weeks ago  Mobility/balance Issues   HPI: Presents today with c/o unsteady gait, leading to ~6+ falls over the last 6 mon. Has been going on since had CVA last yr, but is getting worse. Last fall was ~2 wks ago & has Lt foot pain/swelling from this. Lt foot may drag some on the ground & cause her to fall. Tries to concentrate on lifting leg as much as possible. Feels very unbalanced a lot. Has tried ice on  Lt leg, which isn't helping swelling. Taking tylenol for pain, which may help. Lt foot & ankle hurt & Rt foot hurts. Does have both a cane & walking stick, but doesn't use either consistently.  PAIN:  Are you having pain? No  PRECAUTIONS: Fall  WEIGHT BEARING RESTRICTIONS No  FALLS:  Has patient fallen in last 6 months? No  LIVING ENVIRONMENT: Lives with: lives with their family and lives with their spouse Lives in: House/apartment Has following equipment at home: Single point cane  OCCUPATION: Retired  PLOF: Independent  PATIENT GOALS Decrease fall risk/ improve balance/ walking.     OBJECTIVE:  PATIENT SURVEYS:  FOTO initial 49/ goal 38.    COGNITION:  Overall cognitive status: Within functional limits for tasks assessed     SENSATION: WFL  EDEMA:  Pitting edema in B lower legs.    POSTURE: rounded shoulders and forward  head (minimal)- able to self-correct.   LOWER EXTREMITY ROM:  Active ROM Right eval Left eval  Hip flexion [facility]  Hip extension    Hip abduction [facility]  Hip adduction    Hip internal rotation    Hip external rotation    Knee flexion [facility] WFL  Knee extension [facility] WFL  Ankle dorsiflexion 0 deg. -2 deg.   Ankle plantarflexion [facility]  Ankle inversion    Ankle eversion     (Blank rows = not tested)  LOWER EXTREMITY MMT:  MMT Right eval Left eval  Hip flexion 4+/5 4/5  Hip extension    Hip abduction 4+/5 4/5  Hip adduction 5/5 4+/5  Hip internal rotation    Hip external rotation    Knee flexion 5/5 5/5  Knee extension 5/5 4+/5  Ankle dorsiflexion 4/5 4/5  Ankle plantarflexion 4/5 4/5  Ankle inversion    Ankle eversion     (Blank rows = not tested)  FUNCTIONAL TESTS:  Berg Balance Scale: 41/56 (high fall risk)- explained to patient.    GAIT: Distance walked: in clinic Assistive device utilized: Single point cane Level of assistance: Modified independence and SBA Comments: Pt. Benefits from use of SPC or walking stick to improve step pattern/ heel strike.     TODAY'S TREATMENT:  06/27/22:  Nustep L5 10 min. B UE/LE.   Discussed weekend activities and use of walking stick with outdoor walking.    Sit to stands from gray chair with proper technique/ posture  Standing marching/ hip abduction in //-bars 20x.   Neuro:  Hallway walking:  high marching with no UE assist 2 laps.   Lateral walking with increase hip flexion/ upright posture. Walking in hallway with alt. UE/ LE touches.    Braiding in //-bars L/R with upright posture and mirror feedback (4 laps)    Tandem gait: forward/ backwards walking in //-bars with mirror feedback (Light UE required for safety).    Star ex.: cone taps/ varying colors on L/R (SBA/CGA for safety and cuing).    Walking outside on sidewalk/ curb/ ramp and ascending/ descending stairs.  SBA for safety without use of  SPC     PATIENT EDUCATION:  Education details: Use of SPC/ Access Code: [email or web address] Person educated: Patient Education method: Customer service manager Education comprehension: verbalized understanding and returned demonstration   HOME EXERCISE PROGRAM: Access Code: [email or web address] URL: [email or web address] Date: 06/15/2022 Prepared by: Dorcas Carrow  Exercises - Standing March with Unilateral Counter Support  - 1 x daily - 5 x weekly - 2 sets - 10 reps - Standing Hip Abduction  with Unilateral Counter Support  - 1 x daily - 5 x weekly - 2 sets - 10 reps - Standing Tandem Balance with Counter Support  - 1 x daily - 5 x weekly - 2 sets - 10 reps - Mini Squat with Counter Support  - 1 x daily - 5 x weekly - 2 sets - 10 reps  ASSESSMENT:  CLINICAL IMPRESSION: Pt. Ascends/descends outside stairs with recip. Gait pattern and on UE assist safely.  Pt. Has marked fatigue with ascending stairs and requires extra time.  No LOB while walking in grass/ uneven terrain of parking lot.  Pt. Continues to benefit from use of SPC or walking stick with all walking to improve gait pattern/ decrease antalgic stepping.  No foot shuffling or drop foot noted while using assistive device.  Pt. Will benefit from skilled PT services to increase B hip/LE muscle strength to improve pain-free mobility/ decrease fall risk.     OBJECTIVE IMPAIRMENTS Abnormal gait, decreased activity tolerance, decreased balance, decreased coordination, decreased endurance, decreased mobility, difficulty walking, decreased ROM, decreased strength, increased edema, impaired flexibility, improper body mechanics, postural dysfunction, and pain.   ACTIVITY LIMITATIONS carrying, lifting, bending, standing, squatting, transfers, locomotion level, and caring for others  PARTICIPATION LIMITATIONS: cleaning, driving, community activity, and yard work  [place] Age, Education, Fitness, and Past/current experiences are also  affecting patient's functional outcome.   REHAB POTENTIAL: Good  CLINICAL DECISION MAKING: Evolving/moderate complexity  EVALUATION COMPLEXITY: Moderate   GOALS: Goals reviewed with patient? Yes  SHORT TERM GOALS: Target date: 07/04/22 Pt. Will be independent with HEP to increase hip flexor strength 1/2 muscle grade to improve step pattern/ decrease fall risk.   Baseline:  see HEP Goal status: INITIAL   LONG TERM GOALS: Target date: 07/25/22  Pt. Will increase FOTO score to 53 to improve pain-free mobility.   Baseline:  initial FOTO 49 Goal status: INITIAL  2.  Pt. Will increase Berg balance test to >48/56 to decrease fall risk/ improve functional mobility.   Baseline: Berg 41/56 Goal status: INITIAL  3.  Pt. Will ambulates with consistent 2-point gait pattern with use of SPC and report no falls consistently for while in PT.   Baseline: Pt. Reports numerous falls and is currently not using SPC Goal status: INITIAL   PLAN: PT FREQUENCY: 2x/week  PT DURATION: 6 weeks  PLANNED INTERVENTIONS: Therapeutic exercises, Therapeutic activity, Neuromuscular re-education, Balance training, Gait training, Patient/Family education, Self Care, Joint mobilization, Stair training, Cryotherapy, Moist heat, and Manual therapy  PLAN FOR NEXT SESSION: Dynamic balance tasks  Pura Spice, PT, DPT # [phone] 06/27/2022, 7:19 PM

## 2022-06-28 ENCOUNTER — Encounter: Payer: Medicare HMO | Admitting: Physical Therapy

## 2022-06-29 ENCOUNTER — Ambulatory Visit: Payer: Medicare HMO | Admitting: Physical Therapy

## 2022-06-29 ENCOUNTER — Encounter: Payer: Self-pay | Admitting: Physical Therapy

## 2022-06-29 NOTE — Therapy (Signed)
OUTPATIENT PHYSICAL THERAPY LOWER EXTREMITY TREATMENT   Patient Name: Kelly Krause MRN: [id number] DOB:02-22-1941, 81 y.o., female Today's Date: 06/29/2022   PT End of Session - 06/29/22 1423     Visit Number 5    Number of Visits 12    Date for PT Re-Evaluation 07/25/22    Equipment Utilized During Treatment Gait belt    Activity Tolerance Patient tolerated treatment well    Behavior During Therapy [facility] for tasks assessed/performed             Past Medical History:  Diagnosis Date   Allergy    Atrial fibrillation ([place])    Cancer ([place])    esophagus/chemo and rad   Colon cancer ([place])    chemo/rad 15 yrs   Colon cancer ([place])    2 yrs ago   DDD (degenerative disc disease), cervical    Dysrhythmia    Elevated lipids    GERD (gastroesophageal reflux disease)    Hyperlipidemia    Hypertension    Lung cancer ([place])    chemo   Lung cancer ([place])    Major depressive disorder, recurrent episode, moderate ([place])    Persistent disorder of initiating or maintaining sleep    Personal history of chemotherapy    Personal history of radiation therapy    colon   Stroke ([place]) 06/03/2021   No Deficits   SVT (supraventricular tachycardia) ([place])    Past Surgical History:  Procedure Laterality Date   ABDOMINAL HYSTERECTOMY     ATRIAL FIBRILLATION ABLATION     BROW LIFT Bilateral 01/07/2022   Procedure: BLEPHAROPLASTY UPPER EYELID; W/ EXCESS SKIN BILATERAL;  Surgeon: Karle Starch, MD;  Location: [place];  Service: Ophthalmology;  Laterality: Bilateral;   BUNIONECTOMY     CAPSULOTOMY METATARSOPHALANGEAL Left 04/19/2017   Procedure: CAPSULOTOMY METATARSOPHALANGEAL;  Surgeon: Samara Deist, DPM;  Location: [place];  Service: Podiatry;  Laterality: Left;   CATARACT EXTRACTION W/PHACO Right 08/16/2021   Procedure: CATARACT EXTRACTION PHACO AND INTRAOCULAR LENS PLACEMENT (IOC) RIGHT Eyhance Toric;  Surgeon: Eulogio Bear, MD;  Location: [place];   Service: Ophthalmology;  Laterality: Right;  5.02 00:33.8   CATARACT EXTRACTION W/PHACO Left 08/30/2021   Procedure: CATARACT EXTRACTION PHACO AND INTRAOCULAR LENS PLACEMENT (IOC) LEFT 5.10 00:43.4;  Surgeon: Eulogio Bear, MD;  Location: [place];  Service: Ophthalmology;  Laterality: Left;   COLECTOMY     COLONOSCOPY WITH PROPOFOL N/A 09/26/2017   Procedure: COLONOSCOPY WITH PROPOFOL;  Surgeon: Lollie Sails, MD;  Location: [facility] ENDOSCOPY;  Service: Endoscopy;  Laterality: N/A;   ESOPHAGOGASTRODUODENOSCOPY N/A 04/21/2015   Procedure: ESOPHAGOGASTRODUODENOSCOPY (EGD);  Surgeon: Lollie Sails, MD;  Location: [facility] ENDOSCOPY;  Service: Endoscopy;  Laterality: N/A;   Esophgeal cancer     HAMMER TOE SURGERY Left 04/19/2017   Procedure: HAMMER TOE CORRECTION-3RD & 4TH;  Surgeon: Samara Deist, DPM;  Location: [place];  Service: Podiatry;  Laterality: Left;   LOBECTOMY Right    METATARSAL OSTEOTOMY Left 04/19/2017   Procedure: PHALANX OSTEOTOMY-AKIN - LEFT;  Surgeon: Samara Deist, DPM;  Location: [place];  Service: Podiatry;  Laterality: Left;   PARTIAL HYSTERECTOMY     SINUSOTOMY     Patient Active Problem List   Diagnosis Date Noted   Acute CVA (cerebrovascular accident) ([place]) 06/04/2021   Stroke ([place]) 06/03/2021   Hypertensive urgency 06/03/2021   Sinus bradycardia 06/03/2021   Cancer of cecum ([place]) 10/04/2017   Normocytic anemia 10/04/2017   A-fib ([place])  06/16/2015   Cancer of colon ([place]) 06/16/2015   DD (diverticular disease) 06/16/2015   Esophageal ulcer 06/16/2015   Hallux abducto valgus 06/16/2015   Hammer toe 06/16/2015   H/O varicella 06/16/2015   HK (hyperkeratosis) 06/16/2015   BP (high blood pressure) 06/16/2015   Neuritis or radiculitis due to rupture of lumbar intervertebral disc 06/16/2015   Cancer of lung ([place]) 06/16/2015   Degenerative arthritis of spine 06/16/2015   SCC (squamous cell carcinoma of esophagus) (HCC)  06/16/2015   Supraventricular tachycardia ([place]) 06/16/2015   Temporary cerebral vascular dysfunction 06/16/2015   Familial multiple lipoprotein-type hyperlipidemia 03/27/2015   Clinical depression 03/27/2015   Acid reflux 03/27/2015   CN (constipation) 03/27/2015   Ulcerative cystitis 03/27/2015   Routine general medical examination at a health care facility 03/27/2015   Allergic rhinitis 03/27/2015   H/O malignant neoplasm 03/27/2015   Contusion of knee and lower leg 03/27/2015   Narrowing of intervertebral disc space 03/27/2015   DDD (degenerative disc disease), cervical 03/27/2015   DDD (degenerative disc disease), lumbar 03/27/2015   H/O: depression 03/27/2015   Can't get food down 03/27/2015   Essential (primary) hypertension 03/27/2015   Cephalalgia 03/27/2015   Discitis of lumbar region 03/27/2015   Blood in the urine 03/27/2015   H/O: HTN (hypertension) 03/27/2015   Menopause 03/27/2015   Screening for depression 03/27/2015   Sinus infection 03/27/2015   FOM (frequency of micturition) 03/27/2015   Arrhythmia, ventricular 03/27/2015   Encounter for mammogram to establish baseline mammogram 03/27/2015   Atrial tachycardia ([place]) 03/27/2015   Lumbar and sacral osteoarthritis 11/03/2014   Atrial fibrillation ([place]) 09/18/2014   Cancer of esophagus ([place]) 06/25/2014   Malignant neoplasm of esophagus ([place]) 06/25/2014    PCP: Sallee Lange, NP  REFERRING PROVIDER: Sallee Lange, NP  REFERRING DIAG:  Diagnosis  R26.81 (ICD-10-CM) - Unsteadiness on feet  M21.372 (ICD-10-CM) - Foot drop, left foot  Z86.73 (ICD-10-CM) - Personal history of transient ischemic attack (TIA), and cerebral infarction without residual deficits   THERAPY DIAG:  Gait difficulty  Muscle weakness (generalized)  Imbalance  Rationale for Evaluation and Treatment Rehabilitation  ONSET DATE: 11/21/2021  SUBJECTIVE:   SUBJECTIVE STATEMENT:  06/29/22:  Pts. Chart opened in error.  No  call/ no show today.    06/27/22:  Pt. States she is tired today.  Pt. Washed some walls today and did some yardwork.  No falls reported.  No pain reported.  Pt. Entered PT with use of SPC today.    PERTINENT HISTORY: Sallee Lange, NP - 05/04/2022 11:30 AM EDT Formatting of this note is different from the original. Images from the original note were not included. ID Data: Moshe Cipro, 81 y.o. female  CC:  Chief Complaint  Patient presents with  Essie Christine 6 times in the last 6 months  Gait Problem  Unsteady gait  Foot Pain  Left foot pain from fall 2 weeks ago  Foot Swelling  Left foot swelling from fall 2 weeks ago  Mobility/balance Issues   HPI: Presents today with c/o unsteady gait, leading to ~6+ falls over the last 6 mon. Has been going on since had CVA last yr, but is getting worse. Last fall was ~2 wks ago & has Lt foot pain/swelling from this. Lt foot may drag some on the ground & cause her to fall. Tries to concentrate on lifting leg as much as possible. Feels very unbalanced a lot. Has tried ice on Lt leg, which isn't  helping swelling. Taking tylenol for pain, which may help. Lt foot & ankle hurt & Rt foot hurts. Does have both a cane & walking stick, but doesn't use either consistently.  PAIN:  Are you having pain? No  PRECAUTIONS: Fall  WEIGHT BEARING RESTRICTIONS No  FALLS:  Has patient fallen in last 6 months? No  LIVING ENVIRONMENT: Lives with: lives with their family and lives with their spouse Lives in: House/apartment Has following equipment at home: Single point cane  OCCUPATION: Retired  PLOF: Independent  PATIENT GOALS Decrease fall risk/ improve balance/ walking.     OBJECTIVE:  PATIENT SURVEYS:  FOTO initial 49/ goal 59.    COGNITION:  Overall cognitive status: Within functional limits for tasks assessed     SENSATION: WFL  EDEMA:  Pitting edema in B lower legs.    POSTURE: rounded shoulders and forward head (minimal)-  able to self-correct.   LOWER EXTREMITY ROM:  Active ROM Right eval Left eval  Hip flexion [facility]  Hip extension    Hip abduction [facility]  Hip adduction    Hip internal rotation    Hip external rotation    Knee flexion [facility] WFL  Knee extension [facility] WFL  Ankle dorsiflexion 0 deg. -2 deg.   Ankle plantarflexion [facility]  Ankle inversion    Ankle eversion     (Blank rows = not tested)  LOWER EXTREMITY MMT:  MMT Right eval Left eval  Hip flexion 4+/5 4/5  Hip extension    Hip abduction 4+/5 4/5  Hip adduction 5/5 4+/5  Hip internal rotation    Hip external rotation    Knee flexion 5/5 5/5  Knee extension 5/5 4+/5  Ankle dorsiflexion 4/5 4/5  Ankle plantarflexion 4/5 4/5  Ankle inversion    Ankle eversion     (Blank rows = not tested)  FUNCTIONAL TESTS:  Berg Balance Scale: 41/56 (high fall risk)- explained to patient.    GAIT: Distance walked: in clinic Assistive device utilized: Single point cane Level of assistance: Modified independence and SBA Comments: Pt. Benefits from use of SPC or walking stick to improve step pattern/ heel strike.     TODAY'S TREATMENT:  06/27/22:  Nustep L5 10 min. B UE/LE.   Discussed weekend activities and use of walking stick with outdoor walking.    Sit to stands from gray chair with proper technique/ posture  Standing marching/ hip abduction in //-bars 20x.   Neuro:  Hallway walking:  high marching with no UE assist 2 laps.   Lateral walking with increase hip flexion/ upright posture. Walking in hallway with alt. UE/ LE touches.    Braiding in //-bars L/R with upright posture and mirror feedback (4 laps)    Tandem gait: forward/ backwards walking in //-bars with mirror feedback (Light UE required for safety).    Star ex.: cone taps/ varying colors on L/R (SBA/CGA for safety and cuing).    Walking outside on sidewalk/ curb/ ramp and ascending/ descending stairs.  SBA for safety without use of SPC     PATIENT EDUCATION:   Education details: Use of SPC/ Access Code: [email or web address] Person educated: Patient Education method: Customer service manager Education comprehension: verbalized understanding and returned demonstration   HOME EXERCISE PROGRAM: Access Code: [email or web address] URL: [email or web address] Date: 06/15/2022 Prepared by: Dorcas Carrow  Exercises - Standing March with Unilateral Counter Support  - 1 x daily - 5 x weekly - 2 sets - 10 reps - Standing Hip Abduction with Unilateral Counter Support  -  1 x daily - 5 x weekly - 2 sets - 10 reps - Standing Tandem Balance with Counter Support  - 1 x daily - 5 x weekly - 2 sets - 10 reps - Mini Squat with Counter Support  - 1 x daily - 5 x weekly - 2 sets - 10 reps  ASSESSMENT:  CLINICAL IMPRESSION:  06/27/22 Pt. Ascends/descends outside stairs with recip. Gait pattern and on UE assist safely.  Pt. Has marked fatigue with ascending stairs and requires extra time.  No LOB while walking in grass/ uneven terrain of parking lot.  Pt. Continues to benefit from use of SPC or walking stick with all walking to improve gait pattern/ decrease antalgic stepping.  No foot shuffling or drop foot noted while using assistive device.  Pt. Will benefit from skilled PT services to increase B hip/LE muscle strength to improve pain-free mobility/ decrease fall risk.     OBJECTIVE IMPAIRMENTS Abnormal gait, decreased activity tolerance, decreased balance, decreased coordination, decreased endurance, decreased mobility, difficulty walking, decreased ROM, decreased strength, increased edema, impaired flexibility, improper body mechanics, postural dysfunction, and pain.   ACTIVITY LIMITATIONS carrying, lifting, bending, standing, squatting, transfers, locomotion level, and caring for others  PARTICIPATION LIMITATIONS: cleaning, driving, community activity, and yard work  [place]erve Age, Education, Fitness, and Past/current experiences are also affecting patient's  functional outcome.   REHAB POTENTIAL: Good  CLINICAL DECISION MAKING: Evolving/moderate complexity  EVALUATION COMPLEXITY: Moderate   GOALS: Goals reviewed with patient? Yes  SHORT TERM GOALS: Target date: 07/04/22 Pt. Will be independent with HEP to increase hip flexor strength 1/2 muscle grade to improve step pattern/ decrease fall risk.   Baseline:  see HEP Goal status: INITIAL   LONG TERM GOALS: Target date: 07/25/22  Pt. Will increase FOTO score to 53 to improve pain-free mobility.   Baseline:  initial FOTO 49 Goal status: INITIAL  2.  Pt. Will increase Berg balance test to >48/56 to decrease fall risk/ improve functional mobility.   Baseline: Berg 41/56 Goal status: INITIAL  3.  Pt. Will ambulates with consistent 2-point gait pattern with use of SPC and report no falls consistently for while in PT.   Baseline: Pt. Reports numerous falls and is currently not using SPC Goal status: INITIAL   PLAN: PT FREQUENCY: 2x/week  PT DURATION: 6 weeks  PLANNED INTERVENTIONS: Therapeutic exercises, Therapeutic activity, Neuromuscular re-education, Balance training, Gait training, Patient/Family education, Self Care, Joint mobilization, Stair training, Cryotherapy, Moist heat, and Manual therapy  PLAN FOR NEXT SESSION: Dynamic balance tasks  Pura Spice, PT, DPT # [id number] 06/29/2022, 2:42 PM

## 2022-06-30 ENCOUNTER — Encounter: Payer: Medicare HMO | Admitting: Physical Therapy

## 2022-07-04 ENCOUNTER — Encounter: Payer: Self-pay | Admitting: Physical Therapy

## 2022-07-04 ENCOUNTER — Ambulatory Visit: Payer: Medicare HMO | Admitting: Physical Therapy

## 2022-07-04 DIAGNOSIS — M6281 Muscle weakness (generalized): Secondary | ICD-10-CM | POA: Diagnosis not present

## 2022-07-04 DIAGNOSIS — R2689 Other abnormalities of gait and mobility: Secondary | ICD-10-CM

## 2022-07-04 DIAGNOSIS — R269 Unspecified abnormalities of gait and mobility: Secondary | ICD-10-CM | POA: Diagnosis not present

## 2022-07-04 NOTE — Therapy (Signed)
OUTPATIENT PHYSICAL THERAPY LOWER EXTREMITY TREATMENT   Patient Name: Danne Scardina MRN: [id number] DOB:1940/12/01, 81 y.o., female Today's Date: 07/04/2022   PT End of Session - 07/04/22 1429     Visit Number 6    Number of Visits 12    Date for PT Re-Evaluation 07/25/22    PT Start Time 1427    PT Stop Time 1510    PT Time Calculation (min) 43 min    Equipment Utilized During Treatment Gait belt    Activity Tolerance Patient tolerated treatment well    Behavior During Therapy WFL for tasks assessed/performed             Past Medical History:  Diagnosis Date   Allergy    Atrial fibrillation ([place])    Cancer ([place])    esophagus/chemo and rad   Colon cancer ([place])    chemo/rad 15 yrs   Colon cancer ([place])    2 yrs ago   DDD (degenerative disc disease), cervical    Dysrhythmia    Elevated lipids    GERD (gastroesophageal reflux disease)    Hyperlipidemia    Hypertension    Lung cancer ([place])    chemo   Lung cancer ([place])    Major depressive disorder, recurrent episode, moderate ([place])    Persistent disorder of initiating or maintaining sleep    Personal history of chemotherapy    Personal history of radiation therapy    colon   Stroke ([place]) 06/03/2021   No Deficits   SVT (supraventricular tachycardia) ([place])    Past Surgical History:  Procedure Laterality Date   ABDOMINAL HYSTERECTOMY     ATRIAL FIBRILLATION ABLATION     BROW LIFT Bilateral 01/07/2022   Procedure: BLEPHAROPLASTY UPPER EYELID; W/ EXCESS SKIN BILATERAL;  Surgeon: Karle Starch, MD;  Location: [place];  Service: Ophthalmology;  Laterality: Bilateral;   BUNIONECTOMY     CAPSULOTOMY METATARSOPHALANGEAL Left 04/19/2017   Procedure: CAPSULOTOMY METATARSOPHALANGEAL;  Surgeon: Samara Deist, DPM;  Location: [place];  Service: Podiatry;  Laterality: Left;   CATARACT EXTRACTION W/PHACO Right 08/16/2021   Procedure: CATARACT EXTRACTION PHACO AND INTRAOCULAR LENS PLACEMENT (IOC) RIGHT  Eyhance Toric;  Surgeon: Eulogio Bear, MD;  Location: [place];  Service: Ophthalmology;  Laterality: Right;  5.02 00:33.8   CATARACT EXTRACTION W/PHACO Left 08/30/2021   Procedure: CATARACT EXTRACTION PHACO AND INTRAOCULAR LENS PLACEMENT (IOC) LEFT 5.10 00:43.4;  Surgeon: Eulogio Bear, MD;  Location: [place];  Service: Ophthalmology;  Laterality: Left;   COLECTOMY     COLONOSCOPY WITH PROPOFOL N/A 09/26/2017   Procedure: COLONOSCOPY WITH PROPOFOL;  Surgeon: Lollie Sails, MD;  Location: [facility] ENDOSCOPY;  Service: Endoscopy;  Laterality: N/A;   ESOPHAGOGASTRODUODENOSCOPY N/A 04/21/2015   Procedure: ESOPHAGOGASTRODUODENOSCOPY (EGD);  Surgeon: Lollie Sails, MD;  [facility] END[facility]COPY;  Service: Endoscopy;  Laterality: N/A;   Esophgeal cancer     HAMMER TOE SURGERY Left 04/19/2017   Procedure: HAMMER TOE CORRECTION-3RD & 4TH;  Surgeon: Samara Deist, DPM;  Location: [place];  Service: Podiatry;  Laterality: Left;   LOBECTOMY Right    METATARSAL OSTEOTOMY Left 04/19/2017   Procedure: PHALANX OSTEOTOMY-AKIN - LEFT;  Surgeon: Samara Deist, DPM;  Location: [place];  Service: Podiatry;  Laterality: Left;   PARTIAL HYSTERECTOMY     SINUSOTOMY     Patient Active Problem List   Diagnosis Date Noted   Acute CVA (cerebrovascular accident) ([place]) 06/04/2021   Stroke ([place]) 06/03/2021   Hypertensive  urgency 06/03/2021   Sinus bradycardia 06/03/2021   Cancer of cecum ([place]) 10/04/2017   Normocytic anemia 10/04/2017   A-fib ([place]) 06/16/2015   Cancer of colon ([place]) 06/16/2015   DD (diverticular disease) 06/16/2015   Esophageal ulcer 06/16/2015   Hallux abducto valgus 06/16/2015   Hammer toe 06/16/2015   H/O varicella 06/16/2015   HK (hyperkeratosis) 06/16/2015   BP (high blood pressure) 06/16/2015   Neuritis or radiculitis due to rupture of lumbar intervertebral disc 06/16/2015   Cancer of lung ([place]) 06/16/2015   Degenerative  arthritis of spine 06/16/2015   SCC (squamous cell carcinoma of esophagus) (HCC) 06/16/2015   Supraventricular tachycardia ([place]) 06/16/2015   Temporary cerebral vascular dysfunction 06/16/2015   Familial multiple lipoprotein-type hyperlipidemia 03/27/2015   Clinical depression 03/27/2015   Acid reflux 03/27/2015   CN (constipation) 03/27/2015   Ulcerative cystitis 03/27/2015   Routine general medical examination at a health care facility 03/27/2015   Allergic rhinitis 03/27/2015   H/O malignant neoplasm 03/27/2015   Contusion of knee and lower leg 03/27/2015   Narrowing of intervertebral disc space 03/27/2015   DDD (degenerative disc disease), cervical 03/27/2015   DDD (degenerative disc disease), lumbar 03/27/2015   H/O: depression 03/27/2015   Can't get food down 03/27/2015   Essential (primary) hypertension 03/27/2015   Cephalalgia 03/27/2015   Discitis of lumbar region 03/27/2015   Blood in the urine 03/27/2015   H/O: HTN (hypertension) 03/27/2015   Menopause 03/27/2015   Screening for depression 03/27/2015   Sinus infection 03/27/2015   FOM (frequency of micturition) 03/27/2015   Arrhythmia, ventricular 03/27/2015   Encounter for mammogram to establish baseline mammogram 03/27/2015   Atrial tachycardia ([place]) 03/27/2015   Lumbar and sacral osteoarthritis 11/03/2014   Atrial fibrillation ([place]) 09/18/2014   Cancer of esophagus ([place]) 06/25/2014   Malignant neoplasm of esophagus ([place]) 06/25/2014    PCP: Sallee Lange, NP  REFERRING PROVIDER: Sallee Lange, NP  REFERRING DIAG:  Diagnosis  R26.81 (ICD-10-CM) - Unsteadiness on feet  M21.372 (ICD-10-CM) - Foot drop, left foot  Z86.73 (ICD-10-CM) - Personal history of transient ischemic attack (TIA), and cerebral infarction without residual deficits   THERAPY DIAG:  Gait difficulty  Muscle weakness (generalized)  Imbalance  Rationale for Evaluation and Treatment Rehabilitation  ONSET DATE:  11/21/2021  SUBJECTIVE:   SUBJECTIVE STATEMENT:  07/04/22:  Pt. States she is tired today.  Pt. 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  No falls reported.  Pt. Using [facility] when entering clinic.  Pt. States she still feels off balance with walking.    PERTINENT HISTORY: Sallee Lange, NP - 05/04/2022 11:30 AM EDT Formatting of this note is different from the original. Images from the original note were not included. ID Data: Moshe Cipro, 81 y.o. female  CC:  Chief Complaint  Patient presents with  Essie Christine 6 times in the last 6 months  Gait Problem  Unsteady gait  Foot Pain  Left foot pain from fall 2 weeks ago  Foot Swelling  Left foot swelling from fall 2 weeks ago  Mobility/balance Issues   HPI: Presents today with c/o unsteady gait, leading to ~6+ falls over the last 6 mon. Has been going on since had CVA last yr, but is getting worse. Last fall was ~2 wks ago & has Lt foot pain/swelling from this. Lt foot may drag some on the ground & cause her to fall. Tries to concentrate on lifting leg as much  as possible. Feels very unbalanced a lot. Has tried ice on Lt leg, which isn't helping swelling. Taking tylenol for pain, which may help. Lt foot & ankle hurt & Rt foot hurts. Does have both a cane & walking stick, but doesn't use either consistently.  PAIN:  Are you having pain? No  PRECAUTIONS: Fall  WEIGHT BEARING RESTRICTIONS No  FALLS:  Has patient fallen in last 6 months? No  LIVING ENVIRONMENT: Lives with: lives with their family and lives with their spouse Lives in: House/apartment Has following equipment at home: Single point cane  OCCUPATION: Retired  PLOF: Independent  PATIENT GOALS Decrease fall risk/ improve balance/ walking.     OBJECTIVE:  PATIENT SURVEYS:  FOTO initial 49/ goal 19.    COGNITION:  Overall cognitive status: Within functional limits for tasks  assessed     SENSATION: WFL  EDEMA:  Pitting edema in B lower legs.    POSTURE: rounded shoulders and forward head (minimal)- able to self-correct.   LOWER EXTREMITY ROM:  Active ROM Right eval Left eval  Hip flexion [facility]  Hip extension    Hip abduction [facility]  Hip adduction    Hip internal rotation    Hip external rotation    Knee flexion [facility] WFL  Knee extension [facility] WFL  Ankle dorsiflexion 0 deg. -2 deg.   Ankle plantarflexion [facility]  Ankle inversion    Ankle eversion     (Blank rows = not tested)  LOWER EXTREMITY MMT:  MMT Right eval Left eval  Hip flexion 4+/5 4/5  Hip extension    Hip abduction 4+/5 4/5  Hip adduction 5/5 4+/5  Hip internal rotation    Hip external rotation    Knee flexion 5/5 5/5  Knee extension 5/5 4+/5  Ankle dorsiflexion 4/5 4/5  Ankle plantarflexion 4/5 4/5  Ankle inversion    Ankle eversion     (Blank rows = not tested)  FUNCTIONAL TESTS:  Berg Balance Scale: 41/56 (high fall risk)- explained to patient.    GAIT: Distance walked: in clinic Assistive device utilized: Single point cane Level of assistance: Modified independence and SBA Comments: Pt. Benefits from use of SPC or walking stick to improve step pattern/ heel strike.     TODAY'S TREATMENT:  07/04/22:  Nustep L5 10 min. B UE/LE.   Discussed weekend activities.  Sit to stands from gray chair at end of tx.  Pt. Unable to stand without UE assist/ wide base of support.  Pt. Requires at least 1 UE assist to safely stand/ correct upright posture.       6" step ups/ downs in //-bars with light to no UE assits 10x2.  Neuro:  Tandem Airex: lateral and forward walking (light UE assist required)- 3x.  Recip. 6" step ups/ overs with light to no UE assist.  CGA for safety and verbal cuing.    Hallway walking:  high marching with no UE assist 3 laps.   Walking in hallway with alt. UE/ LE touches.  Pt. Cued for more consistent arm swing during gait pattern with and  without use of SPC.    Walking outside on sidewalk/ curb/ ramp with consistent recip. Gait pattern.  SBA/mod. I for safety without use of SPC  Reviewed HEP/ discussed importance of daily walking.       PATIENT EDUCATION:  Education details: Use of SPC/ Access Code: [email or web address] Person educated: Patient Education method: Customer service manager Education comprehension: verbalized understanding and returned demonstration  HOME EXERCISE PROGRAM: Access Code: [email or web address] URL: [email or web address] Date: 06/15/2022 Prepared by: Dorcas Carrow  Exercises - Standing March with Unilateral Counter Support  - 1 x daily - 5 x weekly - 2 sets - 10 reps - Standing Hip Abduction with Unilateral Counter Support  - 1 x daily - 5 x weekly - 2 sets - 10 reps - Standing Tandem Balance with Counter Support  - 1 x daily - 5 x weekly - 2 sets - 10 reps - Mini Squat with Counter Support  - 1 x daily - 5 x weekly - 2 sets - 10 reps  ASSESSMENT:  CLINICAL IMPRESSION:  06/27/22 Pt. Has increase fatigue noted in LE during tx. And requires use of at least 1 UE assist during sit to stands from gray chair.  No LOB during tx. Session and pt.continues to benefit from use of SPC or walking stick with all walking to improve gait pattern/ decrease antalgic stepping.  Pt. Will continue with daily walking/ standing there.ex. to increase mobility/ safety with walking.   Pt. Will benefit from skilled PT services to increase B hip/LE muscle strength to improve pain-free mobility/ decrease fall risk.     OBJECTIVE IMPAIRMENTS Abnormal gait, decreased activity tolerance, decreased balance, decreased coordination, decreased endurance, decreased mobility, difficulty walking, decreased ROM, decreased strength, increased edema, impaired flexibility, improper body mechanics, postural dysfunction, and pain.   ACTIVITY LIMITATIONS carrying, lifting, bending, standing, squatting, transfers, locomotion level, and caring  for others  PARTICIPATION LIMITATIONS: cleaning, driving, community activity, and yard work  [place] Age, Education, Fitness, and Past/current experiences are also affecting patient's functional outcome.   REHAB POTENTIAL: Good  CLINICAL DECISION MAKING: Evolving/moderate complexity  EVALUATION COMPLEXITY: Moderate   GOALS: Goals reviewed with patient? Yes  SHORT TERM GOALS: Target date: 07/04/22 Pt. Will be independent with HEP to increase hip flexor strength 1/2 muscle grade to improve step pattern/ decrease fall risk.   Baseline:  see HEP Goal status: Partially met   LONG TERM GOALS: Target date: 07/25/22  Pt. Will increase FOTO score to 53 to improve pain-free mobility.   Baseline:  initial FOTO 49 Goal status: INITIAL  2.  Pt. Will increase Berg balance test to >48/56 to decrease fall risk/ improve functional mobility.   Baseline: Berg 41/56 Goal status: INITIAL  3.  Pt. Will ambulates with consistent 2-point gait pattern with use of SPC and report no falls consistently for while in PT.   Baseline: Pt. Reports numerous falls and is currently not using SPC Goal status: INITIAL   PLAN: PT FREQUENCY: 2x/week  PT DURATION: 6 weeks  PLANNED INTERVENTIONS: Therapeutic exercises, Therapeutic activity, Neuromuscular re-education, Balance training, Gait training, Patient/Family education, Self Care, Joint mobilization, Stair training, Cryotherapy, Moist heat, and Manual therapy  PLAN FOR NEXT SESSION: Dynamic balance tasks  Pura Spice, PT, DPT # [phone] 07/04/2022, 3:14 PM

## 2022-07-05 ENCOUNTER — Encounter: Payer: Medicare HMO | Admitting: Physical Therapy

## 2022-07-06 ENCOUNTER — Encounter: Payer: Self-pay | Admitting: Physical Therapy

## 2022-07-06 ENCOUNTER — Ambulatory Visit: Payer: Medicare HMO | Admitting: Physical Therapy

## 2022-07-06 DIAGNOSIS — M6281 Muscle weakness (generalized): Secondary | ICD-10-CM

## 2022-07-06 DIAGNOSIS — R2689 Other abnormalities of gait and mobility: Secondary | ICD-10-CM | POA: Diagnosis not present

## 2022-07-06 DIAGNOSIS — R269 Unspecified abnormalities of gait and mobility: Secondary | ICD-10-CM | POA: Diagnosis not present

## 2022-07-06 NOTE — Therapy (Signed)
OUTPATIENT PHYSICAL THERAPY LOWER EXTREMITY TREATMENT   Patient Name: Kelly Krause MRN: [id number] DOB:07/27/1941, 81 y.o., female Today's Date: 07/06/2022   PT End of Session - 07/06/22 1436     Visit Number 7    Number of Visits 12    Date for PT Re-Evaluation 07/25/22    PT Start Time 1431    PT Stop Time 1513    PT Time Calculation (min) 42 min    Equipment Utilized During Treatment Gait belt    Activity Tolerance Patient tolerated treatment well    Behavior During Therapy WFL for tasks assessed/performed             Past Medical History:  Diagnosis Date   Allergy    Atrial fibrillation ([place])    Cancer ([place])    esophagus/chemo and rad   Colon cancer ([place])    chemo/rad 15 yrs   Colon cancer ([place])    2 yrs ago   DDD (degenerative disc disease), cervical    Dysrhythmia    Elevated lipids    GERD (gastroesophageal reflux disease)    Hyperlipidemia    Hypertension    Lung cancer ([place])    chemo   Lung cancer ([place])    Major depressive disorder, recurrent episode, moderate ([place])    Persistent disorder of initiating or maintaining sleep    Personal history of chemotherapy    Personal history of radiation therapy    colon   Stroke ([place]) 06/03/2021   No Deficits   SVT (supraventricular tachycardia) ([place])    Past Surgical History:  Procedure Laterality Date   ABDOMINAL HYSTERECTOMY     ATRIAL FIBRILLATION ABLATION     BROW LIFT Bilateral 01/07/2022   Procedure: BLEPHAROPLASTY UPPER EYELID; W/ EXCESS SKIN BILATERAL;  Surgeon: Karle Starch, MD;  Location: [facility];  Service: Ophthalmology;  Laterality: Bilateral;   BUNIONECTOMY     CAPSULOTOMY METATARSOPHALANGEAL Left 04/19/2017   Procedure: CAPSULOTOMY METATARSOPHALANGEAL;  Surgeon: Samara Deist, DPM;  Location: [facility];  Service: Podiatry;  Laterality: Left;   CATARACT EXTRACTION W/PHACO Right 08/16/2021   Procedure: CATARACT EXTRACTION PHACO AND INTRAOCULAR LENS PLACEMENT (IOC) RIGHT  Eyhance Toric;  Surgeon: Eulogio Bear, MD;  Location: [facility] [place];  Service: Ophthalmology;  Laterality: Right;  5.02 00:33.8   CATARACT EXTRACTION W/PHACO Left 08/30/2021   Procedure: CATARACT EXTRACTION PHACO AND INTRAOCULAR LENS PLACEMENT (IOC) LEFT 5.10 00:43.4;  Surgeon: Eulogio Bear, MD;  Location: [facility];  Service: Ophthalmology;  Laterality: Left;   COLECTOMY     COLONOSCOPY WITH PROPOFOL N/A 09/26/2017   Procedure: COLONOSCOPY WITH PROPOFOL;  Surgeon: Lollie Sails, MD;  Location: [facility] ENDOSCOPY;  Service: Endoscopy;  Laterality: N/A;   ESOPHAGOGASTRODUODENOSCOPY N/A 04/21/2015   Procedure: ESOPHAGOGASTRODUODENOSCOPY (EGD);  Surgeon: Lollie Sails, MD;  Location: [facility] ENDOSCOPY;  Service: Endoscopy;  Laterality: N/A;   Esophgeal cancer     HAMMER TOE SURGERY Left 04/19/2017   Procedure: HAMMER TOE CORRECTION-3RD & 4TH;  Surgeon: Samara Deist, DPM;  Location: [place];  Service: Podiatry;  Laterality: Left;   LOBECTOMY Right    METATARSAL OSTEOTOMY Left 04/19/2017   Procedure: PHALANX OSTEOTOMY-AKIN - LEFT;  Surgeon: Samara Deist, DPM;  Location: [place];  Service: Podiatry;  Laterality: Left;   PARTIAL HYSTERECTOMY     SINUSOTOMY     Patient Active Problem List   Diagnosis Date Noted   Acute CVA (cerebrovascular accident) ([place]) 06/04/2021   Stroke ([place]) 06/03/2021   Hypertensive  urgency 06/03/2021   Sinus bradycardia 06/03/2021   Cancer of cecum ([place]) 10/04/2017   Normocytic anemia 10/04/2017   A-fib ([place]) 06/16/2015   Cancer of colon ([place]) 06/16/2015   DD (diverticular disease) 06/16/2015   Esophageal ulcer 06/16/2015   Hallux abducto valgus 06/16/2015   Hammer toe 06/16/2015   H/O varicella 06/16/2015   HK (hyperkeratosis) 06/16/2015   BP (high blood pressure) 06/16/2015   Neuritis or radiculitis due to rupture of lumbar intervertebral disc 06/16/2015   Cancer of lung ([place]) 06/16/2015   Degenerative  arthritis of spine 06/16/2015   SCC (squamous cell carcinoma of esophagus) (HCC) 06/16/2015   Supraventricular tachycardia ([place]) 06/16/2015   Temporary cerebral vascular dysfunction 06/16/2015   Familial multiple lipoprotein-type hyperlipidemia 03/27/2015   Clinical depression 03/27/2015   Acid reflux 03/27/2015   CN (constipation) 03/27/2015   Ulcerative cystitis 03/27/2015   Routine general medical examination at a health care facility 03/27/2015   Allergic rhinitis 03/27/2015   H/O malignant neoplasm 03/27/2015   Contusion of knee and lower leg 03/27/2015   Narrowing of intervertebral disc space 03/27/2015   DDD (degenerative disc disease), cervical 03/27/2015   DDD (degenerative disc disease), lumbar 03/27/2015   H/O: depression 03/27/2015   Can't get food down 03/27/2015   Essential (primary) hypertension 03/27/2015   Cephalalgia 03/27/2015   Discitis of lumbar region 03/27/2015   Blood in the urine 03/27/2015   H/O: HTN (hypertension) 03/27/2015   Menopause 03/27/2015   Screening for depression 03/27/2015   Sinus infection 03/27/2015   FOM (frequency of micturition) 03/27/2015   Arrhythmia, ventricular 03/27/2015   Encounter for mammogram to establish baseline mammogram 03/27/2015   Atrial tachycardia ([place]) 03/27/2015   Lumbar and sacral osteoarthritis 11/03/2014   Atrial fibrillation ([place]) 09/18/2014   Cancer of esophagus ([place]) 06/25/2014   Malignant neoplasm of esophagus ([place]) 06/25/2014    PCP: Sallee Lange, NP  REFERRING PROVIDER: Sallee Lange, NP  REFERRING DIAG:  Diagnosis  R26.81 (ICD-10-CM) - Unsteadiness on feet  M21.372 (ICD-10-CM) - Foot drop, left foot  Z86.73 (ICD-10-CM) - Personal history of transient ischemic attack (TIA), and cerebral infarction without residual deficits   THERAPY DIAG:  Gait difficulty  Muscle weakness (generalized)  Imbalance  Rationale for Evaluation and Treatment Rehabilitation  ONSET DATE:  11/21/2021  SUBJECTIVE:   SUBJECTIVE STATEMENT:  07/06/22:  Pt. States she had a near fall while cleaning house/ moving rugs and padding.  Pt. Using [facility] when entering clinic.  Pt. States she still feels off balance with walking.  Pt. States legs are sore but no pain overall.    PERTINENT HISTORY: Sallee Lange, NP - 05/04/2022 11:30 AM EDT Formatting of this note is different from the original. Images from the original note were not included. ID Data: Moshe Cipro, 81 y.o. female  CC:  Chief Complaint  Patient presents with  Essie Christine 6 times in the last 6 months  Gait Problem  Unsteady gait  Foot Pain  Left foot pain from fall 2 weeks ago  Foot Swelling  Left foot swelling from fall 2 weeks ago  Mobility/balance Issues   HPI: Presents today with c/o unsteady gait, leading to ~6+ falls over the last 6 mon. Has been going on since had CVA last yr, but is getting worse. Last fall was ~2 wks ago & has Lt foot pain/swelling from this. Lt foot may drag some on the ground & cause her to fall. Tries to concentrate on lifting leg as much  as possible. Feels very unbalanced a lot. Has tried ice on Lt leg, which isn't helping swelling. Taking tylenol for pain, which may help. Lt foot & ankle hurt & Rt foot hurts. Does have both a cane & walking stick, but doesn't use either consistently.  PAIN:  Are you having pain? No  PRECAUTIONS: Fall  WEIGHT BEARING RESTRICTIONS No  FALLS:  Has patient fallen in last 6 months? No  LIVING ENVIRONMENT: Lives with: lives with their family and lives with their spouse Lives in: House/apartment Has following equipment at home: Single point cane  OCCUPATION: Retired  PLOF: Independent  PATIENT GOALS Decrease fall risk/ improve balance/ walking.     OBJECTIVE:  PATIENT SURVEYS:  FOTO initial 49/ goal 79.    COGNITION:  Overall cognitive status: Within functional limits for tasks assessed     SENSATION: WFL  EDEMA:  Pitting  edema in B lower legs.    POSTURE: rounded shoulders and forward head (minimal)- able to self-correct.   LOWER EXTREMITY ROM:  Active ROM Right eval Left eval  Hip flexion [facility]  Hip extension    Hip abduction [facility]  Hip adduction    Hip internal rotation    Hip external rotation    Knee flexion [facility] WFL  Knee extension [facility] WFL  Ankle dorsiflexion 0 deg. -2 deg.   Ankle plantarflexion [facility]  Ankle inversion    Ankle eversion     (Blank rows = not tested)  LOWER EXTREMITY MMT:  MMT Right eval Left eval  Hip flexion 4+/5 4/5  Hip extension    Hip abduction 4+/5 4/5  Hip adduction 5/5 4+/5  Hip internal rotation    Hip external rotation    Knee flexion 5/5 5/5  Knee extension 5/5 4+/5  Ankle dorsiflexion 4/5 4/5  Ankle plantarflexion 4/5 4/5  Ankle inversion    Ankle eversion     (Blank rows = not tested)  FUNCTIONAL TESTS:  Berg Balance Scale: 41/56 (high fall risk)- explained to patient.    GAIT: Distance walked: in clinic Assistive device utilized: Single point cane Level of assistance: Modified independence and SBA Comments: Pt. Benefits from use of SPC or walking stick to improve step pattern/ heel strike.     TODAY'S TREATMENT:  07/06/22:  Nustep L5 10 min. B UE/LE.   Discussed pts. Report of near fall while cleaning house.  Pt. Was not using SPC.       6" step ups/ downs in //-bars with light to no UE assits 10x2.     STS in //-bars with proper technique/ mirror feedback.  UE assist   Required for proper technique.    Neuro:  Obstacle course with Airex pads/ 6" step ups/downs/ cone taps at agility ladder. Difficulty with R LE step ups and required min./mod. Ax1 to prevent fall.     Tandem gait forward/ backwards in //-bars (requires light UE assist for balance)- 2 laps.  Cuing to correct head position/ posture.     Hallway walking:  high marching with no UE assist 3 laps.   Walking in hallway with alt. UE/ LE touches (1 lap).  Pt. More aware  of arm swing/ upright posture.  Walking head turns with letter identification (2 laps)    Walking outside on sidewalk/ curb/ grass with consistent recip. Gait pattern.  SBA/mod. I for safety with and without use of SPC  Reviewed HEP/ discussed importance of daily walking.       PATIENT EDUCATION:  Education details: Use of SPC/ Access Code: [email or web address] Person educated: Patient Education method: Customer service manager Education comprehension: verbalized understanding and returned demonstration   HOME EXERCISE PROGRAM: Access Code: [email or web address] URL: [email or web address] Date: 06/15/2022 Prepared by: Dorcas Carrow  Exercises - Standing March with Unilateral Counter Support  - 1 x daily - 5 x weekly - 2 sets - 10 reps - Standing Hip Abduction with Unilateral Counter Support  - 1 x daily - 5 x weekly - 2 sets - 10 reps - Standing Tandem Balance with Counter Support  - 1 x daily - 5 x weekly - 2 sets - 10 reps - Mini Squat with Counter Support  - 1 x daily - 5 x weekly - 2 sets - 10 reps  ASSESSMENT:  CLINICAL IMPRESSION:  06/27/22 Pt. Had a LOB during obstacle course/ stepping up on 6" step and requires PT assist to regain balance. Pt. continues to benefit from use of SPC with all walking to improve gait pattern/ decrease antalgic stepping.  No c/o pain or fatigue during tx. Session with only 2 short seated rest breaks.   Pt. Will continue with daily walking/ standing there.ex. to increase mobility/ safety with walking.   Pt. Will benefit from skilled PT services to increase B hip/LE muscle strength to improve pain-free mobility/ decrease fall risk.     OBJECTIVE IMPAIRMENTS Abnormal gait, decreased activity tolerance, decreased balance, decreased coordination, decreased endurance, decreased mobility, difficulty walking, decreased ROM, decreased strength, increased edema, impaired flexibility, improper body mechanics, postural dysfunction, and pain.   ACTIVITY LIMITATIONS  carrying, lifting, bending, standing, squatting, transfers, locomotion level, and caring for others  PARTICIPATION LIMITATIONS: cleaning, driving, community activity, and yard work  [place] Age, Education, Fitness, and Past/current experiences are also affecting patient's functional outcome.   REHAB POTENTIAL: Good  CLINICAL DECISION MAKING: Evolving/moderate complexity  EVALUATION COMPLEXITY: Moderate   GOALS: Goals reviewed with patient? Yes  SHORT TERM GOALS: Target date: 07/04/22 Pt. Will be independent with HEP to increase hip flexor strength 1/2 muscle grade to improve step pattern/ decrease fall risk.   Baseline:  see HEP Goal status: Partially met   LONG TERM GOALS: Target date: 07/25/22  Pt. Will increase FOTO score to 53 to improve pain-free mobility.   Baseline:  initial FOTO 49 Goal status: INITIAL  2.  Pt. Will increase Berg balance test to >48/56 to decrease fall risk/ improve functional mobility.   Baseline: Berg 41/56 Goal status: INITIAL  3.  Pt. Will ambulates with consistent 2-point gait pattern with use of SPC and report no falls consistently for while in PT.   Baseline: Pt. Reports numerous falls and is currently not using SPC Goal status: INITIAL   PLAN: PT FREQUENCY: 2x/week  PT DURATION: 6 weeks  PLANNED INTERVENTIONS: Therapeutic exercises, Therapeutic activity, Neuromuscular re-education, Balance training, Gait training, Patient/Family education, Self Care, Joint mobilization, Stair training, Cryotherapy, Moist heat, and Manual therapy  PLAN FOR NEXT SESSION: Dynamic balance tasks.  RECHECK BERG test  Pura Spice, PT, DPT # [phone] 07/06/2022, 3:14 PM

## 2022-07-07 ENCOUNTER — Encounter: Payer: Medicare HMO | Admitting: Physical Therapy

## 2022-07-11 ENCOUNTER — Ambulatory Visit: Payer: Medicare HMO | Admitting: Physical Therapy

## 2022-07-11 DIAGNOSIS — M6281 Muscle weakness (generalized): Secondary | ICD-10-CM

## 2022-07-11 DIAGNOSIS — R2689 Other abnormalities of gait and mobility: Secondary | ICD-10-CM | POA: Diagnosis not present

## 2022-07-11 DIAGNOSIS — R269 Unspecified abnormalities of gait and mobility: Secondary | ICD-10-CM

## 2022-07-11 NOTE — Therapy (Signed)
OUTPATIENT PHYSICAL THERAPY LOWER EXTREMITY TREATMENT   Patient Name: Kelly Krause MRN: [id number] DOB:07-Jul-1941, 81 y.o., female Today's Date: 07/11/2022   PT End of Session - 07/11/22 1434     Visit Number 8    Number of Visits 12    Date for PT Re-Evaluation 07/25/22    PT Start Time 9509    PT Stop Time 1515    PT Time Calculation (min) 41 min    Equipment Utilized During Treatment Gait belt    Activity Tolerance Patient tolerated treatment well    Behavior During Therapy WFL for tasks assessed/performed             Past Medical History:  Diagnosis Date   Allergy    Atrial fibrillation ([place])    Cancer ([place])    esophagus/chemo and rad   Colon cancer ([place])    chemo/rad 15 yrs   Colon cancer ([place])    2 yrs ago   DDD (degenerative disc disease), cervical    Dysrhythmia    Elevated lipids    GERD (gastroesophageal reflux disease)    Hyperlipidemia    Hypertension    Lung cancer ([place])    chemo   Lung cancer ([place])    Major depressive disorder, recurrent episode, moderate ([place])    Persistent disorder of initiating or maintaining sleep    Personal history of chemotherapy    Personal history of radiation therapy    colon   Stroke ([place]) 06/03/2021   No Deficits   SVT (supraventricular tachycardia) ([place])    Past Surgical History:  Procedure Laterality Date   ABDOMINAL HYSTERECTOMY     ATRIAL FIBRILLATION ABLATION     BROW LIFT Bilateral 01/07/2022   Procedure: BLEPHAROPLASTY UPPER EYELID; W/ EXCESS SKIN BILATERAL;  Surgeon: Karle Starch, MD;  Location: [place];  Service: Ophthalmology;  Laterality: Bilateral;   BUNIONECTOMY     CAPSULOTOMY METATARSOPHALANGEAL Left 04/19/2017   Procedure: CAPSULOTOMY METATARSOPHALANGEAL;  Surgeon: Samara Deist, DPM;  Location: [place];  Service: Podiatry;  Laterality: Left;   CATARACT EXTRACTION W/PHACO Right 08/16/2021   Procedure: CATARACT EXTRACTION PHACO AND INTRAOCULAR LENS PLACEMENT (IOC) RIGHT  Eyhance Toric;  Surgeon: Eulogio Bear, MD;  Location: [place];  Service: Ophthalmology;  Laterality: Right;  5.02 00:33.8   CATARACT EXTRACTION W/PHACO Left 08/30/2021   Procedure: CATARACT EXTRACTION PHACO AND INTRAOCULAR LENS PLACEMENT (IOC) LEFT 5.10 00:43.4;  Surgeon: Eulogio Bear, MD;  Location: [place];  Service: Ophthalmology;  Laterality: Left;   COLECTOMY     COLONOSCOPY WITH PROPOFOL N/A 09/26/2017   Procedure: COLONOSCOPY WITH PROPOFOL;  Surgeon: Lollie Sails, MD;  Location: [facility] ENDOSCOPY;  Service: Endoscopy;  Laterality: N/A;   ESOPHAGOGASTRODUODENOSCOPY N/A 04/21/2015   Procedure: ESOPHAGOGASTRODUODENOSCOPY (EGD);  Surgeon: Lollie Sails, MD;  Location[facility] ENDOSCOPY;  Service: Endoscopy;  Laterality: N/A;   Esophgeal cancer     HAMMER TOE SURGERY Left 04/19/2017   Procedure: HAMMER TOE CORRECTION-3RD & 4TH;  Surgeon: Samara Deist, DPM;  Location: [place];  Service: Podiatry;  Laterality: Left;   LOBECTOMY Right    METATARSAL OSTEOTOMY Left 04/19/2017   Procedure: PHALANX OSTEOTOMY-AKIN - LEFT;  Surgeon: Samara Deist, DPM;  Location: [place];  Service: Podiatry;  Laterality: Left;   PARTIAL HYSTERECTOMY     SINUSOTOMY     Patient Active Problem List   Diagnosis Date Noted   Acute CVA (cerebrovascular accident) ([place]) 06/04/2021   Stroke ([place]) 06/03/2021   Hypertensive  urgency 06/03/2021   Sinus bradycardia 06/03/2021   Cancer of cecum ([place]) 10/04/2017   Normocytic anemia 10/04/2017   A-fib ([place]) 06/16/2015   Cancer of colon ([place]) 06/16/2015   DD (diverticular disease) 06/16/2015   Esophageal ulcer 06/16/2015   Hallux abducto valgus 06/16/2015   Hammer toe 06/16/2015   H/O varicella 06/16/2015   HK (hyperkeratosis) 06/16/2015   BP (high blood pressure) 06/16/2015   Neuritis or radiculitis due to rupture of lumbar intervertebral disc 06/16/2015   Cancer of lung ([place]) 06/16/2015   Degenerative  arthritis of spine 06/16/2015   SCC (squamous cell carcinoma of esophagus) (HCC) 06/16/2015   Supraventricular tachycardia ([place]) 06/16/2015   Temporary cerebral vascular dysfunction 06/16/2015   Familial multiple lipoprotein-type hyperlipidemia 03/27/2015   Clinical depression 03/27/2015   Acid reflux 03/27/2015   CN (constipation) 03/27/2015   Ulcerative cystitis 03/27/2015   Routine general medical examination at a health care facility 03/27/2015   Allergic rhinitis 03/27/2015   H/O malignant neoplasm 03/27/2015   Contusion of knee and lower leg 03/27/2015   Narrowing of intervertebral disc space 03/27/2015   DDD (degenerative disc disease), cervical 03/27/2015   DDD (degenerative disc disease), lumbar 03/27/2015   H/O: depression 03/27/2015   Can't get food down 03/27/2015   Essential (primary) hypertension 03/27/2015   Cephalalgia 03/27/2015   Discitis of lumbar region 03/27/2015   Blood in the urine 03/27/2015   H/O: HTN (hypertension) 03/27/2015   Menopause 03/27/2015   Screening for depression 03/27/2015   Sinus infection 03/27/2015   FOM (frequency of micturition) 03/27/2015   Arrhythmia, ventricular 03/27/2015   Encounter for mammogram to establish baseline mammogram 03/27/2015   Atrial tachycardia ([place]) 03/27/2015   Lumbar and sacral osteoarthritis 11/03/2014   Atrial fibrillation ([place]) 09/18/2014   Cancer of esophagus ([place]) 06/25/2014   Malignant neoplasm of esophagus ([place]) 06/25/2014    PCP: Sallee Lange, NP  REFERRING PROVIDER: Sallee Lange, NP  REFERRING DIAG:  Diagnosis  R26.81 (ICD-10-CM) - Unsteadiness on feet  M21.372 (ICD-10-CM) - Foot drop, left foot  Z86.73 (ICD-10-CM) - Personal history of transient ischemic attack (TIA), and cerebral infarction without residual deficits   THERAPY DIAG:  Gait difficulty  Muscle weakness (generalized)  Imbalance  Rationale for Evaluation and Treatment Rehabilitation  ONSET DATE:  11/21/2021  SUBJECTIVE:   SUBJECTIVE STATEMENT:  07/11/22:  Pt. Reports no new complaints.  Pt. Has family in town and has been busy.  Pt. May go to [facility] this week and will contact PT to cancel Wednesday appt. If out of town.  Pt. Using [facility] when entering clinic.  No c/o pain.   PERTINENT HISTORY: Sallee Lange, NP - 05/04/2022 11:30 AM EDT Formatting of this note is different from the original. Images from the original note were not included. ID Data: Moshe Cipro, 81 y.o. female  CC:  Chief Complaint  Patient presents with  Essie Christine 6 times in the last 6 months  Gait Problem  Unsteady gait  Foot Pain  Left foot pain from fall 2 weeks ago  Foot Swelling  Left foot swelling from fall 2 weeks ago  Mobility/balance Issues   HPI: Presents today with c/o unsteady gait, leading to ~6+ falls over the last 6 mon. Has been going on since had CVA last yr, but is getting worse. Last fall was ~2 wks ago & has Lt foot pain/swelling from this. Lt foot may drag some on the ground & cause her to fall. Tries to concentrate on  lifting leg as much as possible. Feels very unbalanced a lot. Has tried ice on Lt leg, which isn't helping swelling. Taking tylenol for pain, which may help. Lt foot & ankle hurt & Rt foot hurts. Does have both a cane & walking stick, but doesn't use either consistently.  PAIN:  Are you having pain? No  PRECAUTIONS: Fall  WEIGHT BEARING RESTRICTIONS No  FALLS:  Has patient fallen in last 6 months? No  LIVING ENVIRONMENT: Lives with: lives with their family and lives with their spouse Lives in: House/apartment Has following equipment at home: Single point cane  OCCUPATION: Retired  PLOF: Independent  PATIENT GOALS Decrease fall risk/ improve balance/ walking.     OBJECTIVE:  PATIENT SURVEYS:  FOTO initial 49/ goal 19.    COGNITION:  Overall cognitive status: Within functional limits for tasks assessed     SENSATION: WFL  EDEMA:   Pitting edema in B lower legs.    POSTURE: rounded shoulders and forward head (minimal)- able to self-correct.   LOWER EXTREMITY ROM:  Active ROM Right eval Left eval  Hip flexion [facility]  Hip extension    Hip abduction [facility]  Hip adduction    Hip internal rotation    Hip external rotation    Knee flexion [facility] WFL  Knee extension [facility] WFL  Ankle dorsiflexion 0 deg. -2 deg.   Ankle plantarflexion [facility]  Ankle inversion    Ankle eversion     (Blank rows = not tested)  LOWER EXTREMITY MMT:  MMT Right eval Left eval  Hip flexion 4+/5 4/5  Hip extension    Hip abduction 4+/5 4/5  Hip adduction 5/5 4+/5  Hip internal rotation    Hip external rotation    Knee flexion 5/5 5/5  Knee extension 5/5 4+/5  Ankle dorsiflexion 4/5 4/5  Ankle plantarflexion 4/5 4/5  Ankle inversion    Ankle eversion     (Blank rows = not tested)  FUNCTIONAL TESTS:  Berg Balance Scale: 41/56 (high fall risk)- explained to patient.    GAIT: Distance walked: in clinic Assistive device utilized: Single point cane Level of assistance: Modified independence and SBA Comments: Pt. Benefits from use of SPC or walking stick to improve step pattern/ heel strike.     TODAY'S TREATMENT:  07/11/22:     4# ankle wts.: seated marching/ LAQ 20x each and standing hamstring curls 15x (fatigue).    Nustep L5 10 min. B UE/LE.     Neuro:     Walking in //-bars forward and lateral with no UE assist.  Braiding in //-bars (forward/ backwards)- 2 laps each.  Mirror feedback for posture.       Hallway walking:  high marching with no UE assist 3 laps.   Walking in hallway with alt. UE/ LE touches (2 laps).  Walking in hallway with ball toss forward/ to side with PT assist. No LOB  Tandem gait forward/ backwards in //-bars (requires light UE assist for balance)- 2 laps.  Cuing to correct head position/ posture.     Walking outside on sidewalk/ curb/ grass with consistent recip. Gait pattern.  SBA/mod. I for  safety with and without use of SPC  Reviewed HEP/ discussed importance of daily walking.       PATIENT EDUCATION:  Education details: Use of SPC/ Access Code: [email or web address] Person educated: Patient Education method: Customer service manager Education comprehension: verbalized understanding and returned demonstration   HOME EXERCISE PROGRAM: Access Code: [email or web address] URL: [email or web address] Date:  06/15/2022 Prepared by: Dorcas Carrow  Exercises - Standing March with Unilateral Counter Support  - 1 x daily - 5 x weekly - 2 sets - 10 reps - Standing Hip Abduction with Unilateral Counter Support  - 1 x daily - 5 x weekly - 2 sets - 10 reps - Standing Tandem Balance with Counter Support  - 1 x daily - 5 x weekly - 2 sets - 10 reps - Mini Squat with Counter Support  - 1 x daily - 5 x weekly - 2 sets - 10 reps  ASSESSMENT:  CLINICAL IMPRESSION:  06/27/22 Pt. continues to benefit from use of SPC with all walking to improve gait pattern/ decrease antalgic stepping.  No c/o pain or fatigue during tx. Session with only 2 short seated rest breaks.  Pt. Able to complete hallway tasks with no LOB and improved alt/ UT/LE touches in center of hallway.   Pt. Will benefit from skilled PT services to increase B hip/LE muscle strength to improve pain-free mobility/ decrease fall risk.     OBJECTIVE IMPAIRMENTS Abnormal gait, decreased activity tolerance, decreased balance, decreased coordination, decreased endurance, decreased mobility, difficulty walking, decreased ROM, decreased strength, increased edema, impaired flexibility, improper body mechanics, postural dysfunction, and pain.   ACTIVITY LIMITATIONS carrying, lifting, bending, standing, squatting, transfers, locomotion level, and caring for others  PARTICIPATION LIMITATIONS: cleaning, driving, community activity, and yard work  [place] Age, Education, Fitness, and Past/current experiences are also affecting patient's  functional outcome.   REHAB POTENTIAL: Good  CLINICAL DECISION MAKING: Evolving/moderate complexity  EVALUATION COMPLEXITY: Moderate   GOALS: Goals reviewed with patient? Yes  SHORT TERM GOALS: Target date: 07/04/22 Pt. Will be independent with HEP to increase hip flexor strength 1/2 muscle grade to improve step pattern/ decrease fall risk.   Baseline:  see HEP Goal status: Partially met   LONG TERM GOALS: Target date: 07/25/22  Pt. Will increase FOTO score to 53 to improve pain-free mobility.   Baseline:  initial FOTO 49 Goal status: INITIAL  2.  Pt. Will increase Berg balance test to >48/56 to decrease fall risk/ improve functional mobility.   Baseline: Berg 41/56 Goal status: INITIAL  3.  Pt. Will ambulates with consistent 2-point gait pattern with use of SPC and report no falls consistently for while in PT.   Baseline: Pt. Reports numerous falls and is currently not using SPC Goal status: INITIAL   PLAN: PT FREQUENCY: 2x/week  PT DURATION: 6 weeks  PLANNED INTERVENTIONS: Therapeutic exercises, Therapeutic activity, Neuromuscular re-education, Balance training, Gait training, Patient/Family education, Self Care, Joint mobilization, Stair training, Cryotherapy, Moist heat, and Manual therapy  PLAN FOR NEXT SESSION: Dynamic balance tasks.  RECHECK BERG test  Pura Spice, PT, DPT # [phone] 07/11/2022, 5:59 PM

## 2022-07-13 ENCOUNTER — Ambulatory Visit: Payer: Medicare HMO | Admitting: Physical Therapy

## 2022-07-18 ENCOUNTER — Ambulatory Visit: Payer: Medicare HMO | Admitting: Physical Therapy

## 2022-07-18 DIAGNOSIS — I10 Essential (primary) hypertension: Secondary | ICD-10-CM | POA: Diagnosis not present

## 2022-07-18 DIAGNOSIS — E782 Mixed hyperlipidemia: Secondary | ICD-10-CM | POA: Diagnosis not present

## 2022-07-18 DIAGNOSIS — R7302 Impaired glucose tolerance (oral): Secondary | ICD-10-CM | POA: Diagnosis not present

## 2022-07-18 DIAGNOSIS — Z79899 Other long term (current) drug therapy: Secondary | ICD-10-CM | POA: Diagnosis not present

## 2022-07-18 DIAGNOSIS — R69 Illness, unspecified: Secondary | ICD-10-CM | POA: Diagnosis not present

## 2022-07-18 DIAGNOSIS — R051 Acute cough: Secondary | ICD-10-CM | POA: Diagnosis not present

## 2022-07-18 DIAGNOSIS — I480 Paroxysmal atrial fibrillation: Secondary | ICD-10-CM | POA: Diagnosis not present

## 2022-07-18 DIAGNOSIS — I471 Supraventricular tachycardia: Secondary | ICD-10-CM | POA: Diagnosis not present

## 2022-07-18 DIAGNOSIS — Z8673 Personal history of transient ischemic attack (TIA), and cerebral infarction without residual deficits: Secondary | ICD-10-CM | POA: Diagnosis not present

## 2022-07-18 DIAGNOSIS — K219 Gastro-esophageal reflux disease without esophagitis: Secondary | ICD-10-CM | POA: Diagnosis not present

## 2022-07-18 DIAGNOSIS — J069 Acute upper respiratory infection, unspecified: Secondary | ICD-10-CM | POA: Diagnosis not present

## 2022-07-18 DIAGNOSIS — Z03818 Encounter for observation for suspected exposure to other biological agents ruled out: Secondary | ICD-10-CM | POA: Diagnosis not present

## 2022-07-20 ENCOUNTER — Ambulatory Visit: Payer: Medicare HMO | Admitting: Physical Therapy

## 2022-07-20 DIAGNOSIS — R269 Unspecified abnormalities of gait and mobility: Secondary | ICD-10-CM

## 2022-07-20 DIAGNOSIS — M6281 Muscle weakness (generalized): Secondary | ICD-10-CM

## 2022-07-20 DIAGNOSIS — R2689 Other abnormalities of gait and mobility: Secondary | ICD-10-CM

## 2022-07-21 NOTE — Therapy (Signed)
OUTPATIENT PHYSICAL THERAPY LOWER EXTREMITY TREATMENT/DISCHARGE   Patient Name: Kelly Krause MRN: [id number] DOB:11-03-1941, 81 y.o., female Today's Date: 07/20/22   PT End of Session - 07/21/22 1920     Visit Number 9    Number of Visits 12    Date for PT Re-Evaluation 07/25/22    PT Start Time 1429    PT Stop Time 1508    PT Time Calculation (min) 39 min    Equipment Utilized During Treatment Gait belt    Activity Tolerance Patient tolerated treatment well    Behavior During Therapy WFL for tasks assessed/performed             Past Medical History:  Diagnosis Date   Allergy    Atrial fibrillation ([place])    Cancer ([place])    esophagus/chemo and rad   Colon cancer ([place])    chemo/rad 15 yrs   Colon cancer ([place])    2 yrs ago   DDD (degenerative disc disease), cervical    Dysrhythmia    Elevated lipids    GERD (gastroesophageal reflux disease)    Hyperlipidemia    Hypertension    Lung cancer ([place])    chemo   Lung cancer ([place])    Major depressive disorder, recurrent episode, moderate ([place])    Persistent disorder of initiating or maintaining sleep    Personal history of chemotherapy    Personal history of radiation therapy    colon   Stroke ([place]) 06/03/2021   No Deficits   SVT (supraventricular tachycardia) ([place])    Past Surgical History:  Procedure Laterality Date   ABDOMINAL HYSTERECTOMY     ATRIAL FIBRILLATION ABLATION     BROW LIFT Bilateral 01/07/2022   Procedure: BLEPHAROPLASTY UPPER EYELID; W/ EXCESS SKIN BILATERAL;  Surgeon: Karle Starch, MD;  Location: [facility];  Service: Ophthalmology;  Laterality: Bilateral;   BUNIONECTOMY     CAPSULOTOMY METATARSOPHALANGEAL Left 04/19/2017   Procedure: CAPSULOTOMY METATARSOPHALANGEAL;  Surgeon: Samara Deist, DPM;  Location: [facility];  Service: Podiatry;  Laterality: Left;   CATARACT EXTRACTION W/PHACO Right 08/16/2021   Procedure: CATARACT EXTRACTION PHACO AND INTRAOCULAR LENS PLACEMENT (IOC)  RIGHT Eyhance Toric;  Surgeon: Eulogio Bear, MD;  Location: [facility];  Service: Ophthalmology;  Laterality: Right;  5.02 00:33.8   CATARACT EXTRACTION W/PHACO Left 08/30/2021   Procedure: CATARACT EXTRACTION PHACO AND INTRAOCULAR LENS PLACEMENT (IOC) LEFT 5.10 00:43.4;  Surgeon: Eulogio Bear, MD;  Location: [facility];  Service: Ophthalmology;  Laterality: Left;   COLECTOMY     COLONOSCOPY WITH PROPOFOL N/A 09/26/2017   Procedure: COLONOSCOPY WITH PROPOFOL;  Surgeon: Lollie Sails, MD;  Location: [facility];  Service: Endoscopy;  Laterality: N/A;   ESOPHAGOGASTRODUODENOSCOPY N/A 04/21/2015   Procedure: ESOPHAGOGASTRODUODENOSCOPY (EGD);  Surgeon: Lollie Sails, MD;  Location: [facility] ENDOSCOPY;  Service: Endoscopy;  Laterality: N/A;   Esophgeal cancer     HAMMER TOE SURGERY Left 04/19/2017   Procedure: HAMMER TOE CORRECTION-3RD & 4TH;  Surgeon: Samara Deist, DPM;  Location: [facility];  Service: Podiatry;  Laterality: Left;   LOBECTOMY Right    METATARSAL OSTEOTOMY Left 04/19/2017   Procedure: PHALANX OSTEOTOMY-AKIN - LEFT;  Surgeon: Samara Deist, DPM;  Location: [place];  Service: Podiatry;  Laterality: Left;   PARTIAL HYSTERECTOMY     SINUSOTOMY     Patient Active Problem List   Diagnosis Date Noted   Acute CVA (cerebrovascular accident) ([place]) 06/04/2021   Stroke ([place]) 06/03/2021   Hypertensive  urgency 06/03/2021   Sinus bradycardia 06/03/2021   Cancer of cecum ([place]) 10/04/2017   Normocytic anemia 10/04/2017   A-fib ([place]) 06/16/2015   Cancer of colon ([place]) 06/16/2015   DD (diverticular disease) 06/16/2015   Esophageal ulcer 06/16/2015   Hallux abducto valgus 06/16/2015   Hammer toe 06/16/2015   H/O varicella 06/16/2015   HK (hyperkeratosis) 06/16/2015   BP (high blood pressure) 06/16/2015   Neuritis or radiculitis due to rupture of lumbar intervertebral disc 06/16/2015   Cancer of lung ([place]) 06/16/2015   Degenerative  arthritis of spine 06/16/2015   SCC (squamous cell carcinoma of esophagus) (HCC) 06/16/2015   Supraventricular tachycardia ([place]) 06/16/2015   Temporary cerebral vascular dysfunction 06/16/2015   Familial multiple lipoprotein-type hyperlipidemia 03/27/2015   Clinical depression 03/27/2015   Acid reflux 03/27/2015   CN (constipation) 03/27/2015   Ulcerative cystitis 03/27/2015   Routine general medical examination at a health care facility 03/27/2015   Allergic rhinitis 03/27/2015   H/O malignant neoplasm 03/27/2015   Contusion of knee and lower leg 03/27/2015   Narrowing of intervertebral disc space 03/27/2015   DDD (degenerative disc disease), cervical 03/27/2015   DDD (degenerative disc disease), lumbar 03/27/2015   H/O: depression 03/27/2015   Can't get food down 03/27/2015   Essential (primary) hypertension 03/27/2015   Cephalalgia 03/27/2015   Discitis of lumbar region 03/27/2015   Blood in the urine 03/27/2015   H/O: HTN (hypertension) 03/27/2015   Menopause 03/27/2015   Screening for depression 03/27/2015   Sinus infection 03/27/2015   FOM (frequency of micturition) 03/27/2015   Arrhythmia, ventricular 03/27/2015   Encounter for mammogram to establish baseline mammogram 03/27/2015   Atrial tachycardia ([place]) 03/27/2015   Lumbar and sacral osteoarthritis 11/03/2014   Atrial fibrillation ([place]) 09/18/2014   Cancer of esophagus ([place]) 06/25/2014   Malignant neoplasm of esophagus ([place]) 06/25/2014    PCP: Sallee Lange, NP  REFERRING PROVIDER: Sallee Lange, NP  REFERRING DIAG:  Diagnosis  R26.81 (ICD-10-CM) - Unsteadiness on feet  M21.372 (ICD-10-CM) - Foot drop, left foot  Z86.73 (ICD-10-CM) - Personal history of transient ischemic attack (TIA), and cerebral infarction without residual deficits   THERAPY DIAG:  Gait difficulty  Muscle weakness (generalized)  Imbalance  Rationale for Evaluation and Treatment Rehabilitation  ONSET DATE:  11/21/2021  SUBJECTIVE:   SUBJECTIVE STATEMENT:  07/20/22:  Pt. Reports no new complaints.  Pt. Cancelled last PT appt. Secondary to illness.  Pt. States she is feeling better.  No falls reported.  Pt. States she is tired and has been busy all day.    PERTINENT HISTORY: Sallee Lange, NP - 05/04/2022 11:30 AM EDT Formatting of this note is different from the original. Images from the original note were not included. ID Data: Moshe Cipro, 81 y.o. female  CC:  Chief Complaint  Patient presents with  Essie Christine 6 times in the last 6 months  Gait Problem  Unsteady gait  Foot Pain  Left foot pain from fall 2 weeks ago  Foot Swelling  Left foot swelling from fall 2 weeks ago  Mobility/balance Issues   HPI: Presents today with c/o unsteady gait, leading to ~6+ falls over the last 6 mon. Has been going on since had CVA last yr, but is getting worse. Last fall was ~2 wks ago & has Lt foot pain/swelling from this. Lt foot may drag some on the ground & cause her to fall. Tries to concentrate on lifting leg as much as possible. Feels very  unbalanced a lot. Has tried ice on Lt leg, which isn't helping swelling. Taking tylenol for pain, which may help. Lt foot & ankle hurt & Rt foot hurts. Does have both a cane & walking stick, but doesn't use either consistently.  PAIN:  Are you having pain? No  PRECAUTIONS: Fall  WEIGHT BEARING RESTRICTIONS No  FALLS:  Has patient fallen in last 6 months? No  LIVING ENVIRONMENT: Lives with: lives with their family and lives with their spouse Lives in: House/apartment Has following equipment at home: Single point cane  OCCUPATION: Retired  PLOF: Independent  PATIENT GOALS Decrease fall risk/ improve balance/ walking.     OBJECTIVE:  PATIENT SURVEYS:  FOTO initial 49/ goal 40.    COGNITION:  Overall cognitive status: Within functional limits for tasks assessed     SENSATION: WFL  EDEMA:  Pitting edema in B lower legs.     POSTURE: rounded shoulders and forward head (minimal)- able to self-correct.   LOWER EXTREMITY ROM:  Active ROM Right eval Left eval  Hip flexion [facility]  Hip extension    Hip abduction [facility]  Hip adduction    Hip internal rotation    Hip external rotation    Knee flexion [facility] WFL  Knee extension [facility] WFL  Ankle dorsiflexion 0 deg. -2 deg.   Ankle plantarflexion [facility]  Ankle inversion    Ankle eversion     (Blank rows = not tested)  LOWER EXTREMITY MMT:  MMT Right eval Left eval  Hip flexion 4+/5 4/5  Hip extension    Hip abduction 4+/5 4/5  Hip adduction 5/5 4+/5  Hip internal rotation    Hip external rotation    Knee flexion 5/5 5/5  Knee extension 5/5 4+/5  Ankle dorsiflexion 4/5 4/5  Ankle plantarflexion 4/5 4/5  Ankle inversion    Ankle eversion     (Blank rows = not tested)  FUNCTIONAL TESTS:  Berg Balance Scale: 41/56 (high fall risk)- explained to patient.    GAIT: Distance walked: in clinic Assistive device utilized: Single point cane Level of assistance: Modified independence and SBA Comments: Pt. Benefits from use of SPC or walking stick to improve step pattern/ heel strike.     TODAY'S TREATMENT:  07/20/22:  Nustep L5 10 min. B UE/LE.     Reviewed HEP/ importance of consistent walking program and use of SPC, esp. With outside walking.   FOTO: 58 (goal met)  Neuro:     Berg balance test: 49/56 (marked improvement)     Walking in //-bars forward and lateral with no UE assist.  High marching/ alt. UE and LE touches with mirror feedback.        Stairs without use of SPC and 1 handrail assist.  3 reps.   Walking outside on sidewalk/ curb/ grass with consistent recip. Gait pattern.  SBA/mod. I for safety with and without use of SPC  Reviewed HEP/ discussed importance of daily walking.       PATIENT EDUCATION:  Education details: Use of SPC/ Access Code: [email or web address] Person educated: Patient Education method: Holiday representative Education comprehension: verbalized understanding and returned demonstration   HOME EXERCISE PROGRAM: Access Code: [email or web address] URL: [email or web address] Date: 06/15/2022 Prepared by: Dorcas Carrow  Exercises - Standing March with Unilateral Counter Support  - 1 x daily - 5 x weekly - 2 sets - 10 reps - Standing Hip Abduction with Unilateral Counter Support  - 1 x daily - 5 x weekly - 2  sets - 10 reps - Standing Tandem Balance with Counter Support  - 1 x daily - 5 x weekly - 2 sets - 10 reps - Mini Squat with Counter Support  - 1 x daily - 5 x weekly - 2 sets - 10 reps  ASSESSMENT:  CLINICAL IMPRESSION:  07/20/22 Pt. continues to benefit from use of SPC with all walking to improve gait pattern/ decrease antalgic stepping.  Pt. Has shown progress towards goals (marked improvement in [place] and [place]).  Pt. Will be discharged from skilled PT at this time with focus on daily walking/ HEP/ use of SPC.  Pt. Instructed to contact PT if any issues or falls.      OBJECTIVE IMPAIRMENTS Abnormal gait, decreased activity tolerance, decreased balance, decreased coordination, decreased endurance, decreased mobility, difficulty walking, decreased ROM, decreased strength, increased edema, impaired flexibility, improper body mechanics, postural dysfunction, and pain.   ACTIVITY LIMITATIONS carrying, lifting, bending, standing, squatting, transfers, locomotion level, and caring for others  PARTICIPATION LIMITATIONS: cleaning, driving, community activity, and yard work  [place] Age, Education, Fitness, and Past/current experiences are also affecting patient's functional outcome.   REHAB POTENTIAL: Good  CLINICAL DECISION MAKING: Evolving/moderate complexity  EVALUATION COMPLEXITY: Moderate   GOALS: Goals reviewed with patient? Yes  SHORT TERM GOALS: Target date: 07/04/22 Pt. Will be independent with HEP to increase hip flexor strength 1/2 muscle grade to improve  step pattern/ decrease fall risk.   Baseline:  see HEP Goal status: Goal met   LONG TERM GOALS: Target date: 07/25/22  Pt. Will increase FOTO score to 53 to improve pain-free mobility.   Baseline:  initial FOTO 49.  8/30: 58 Goal status: Goal met  2.  Pt. Will increase Berg balance test to >48/56 to decrease fall risk/ improve functional mobility.   Baseline: Berg 41/56.  8/30: 49/56 Goal status: Goal met  3.  Pt. Will ambulates with consistent 2-point gait pattern with use of SPC and report no falls consistently for while in PT.   Baseline: Pt. Reports numerous falls and is currently not using SPC.  8/30: no recent falls.   Goal status: Goal met   PLAN: PT FREQUENCY: 2x/week  PT DURATION: 6 weeks  PLANNED INTERVENTIONS: Therapeutic exercises, Therapeutic activity, Neuromuscular re-education, Balance training, Gait training, Patient/Family education, Self Care, Joint mobilization, Stair training, Cryotherapy, Moist heat, and Manual therapy  PLAN FOR NEXT SESSION: Discharge  Pura Spice, PT, DPT # [phone] 07/21/2022, 7:22 PM

## 2022-08-12 DIAGNOSIS — I10 Essential (primary) hypertension: Secondary | ICD-10-CM | POA: Diagnosis not present

## 2022-08-12 DIAGNOSIS — Z888 Allergy status to other drugs, medicaments and biological substances status: Secondary | ICD-10-CM | POA: Diagnosis not present

## 2022-08-12 DIAGNOSIS — I4891 Unspecified atrial fibrillation: Secondary | ICD-10-CM | POA: Diagnosis not present

## 2022-08-12 DIAGNOSIS — Z9049 Acquired absence of other specified parts of digestive tract: Secondary | ICD-10-CM | POA: Diagnosis not present

## 2022-08-12 DIAGNOSIS — R1310 Dysphagia, unspecified: Secondary | ICD-10-CM | POA: Diagnosis not present

## 2022-08-12 DIAGNOSIS — Z90710 Acquired absence of both cervix and uterus: Secondary | ICD-10-CM | POA: Diagnosis not present

## 2022-08-12 DIAGNOSIS — Z79899 Other long term (current) drug therapy: Secondary | ICD-10-CM | POA: Diagnosis not present

## 2022-08-12 DIAGNOSIS — Z9889 Other specified postprocedural states: Secondary | ICD-10-CM | POA: Diagnosis not present

## 2022-08-12 DIAGNOSIS — Z7982 Long term (current) use of aspirin: Secondary | ICD-10-CM | POA: Diagnosis not present

## 2022-08-12 DIAGNOSIS — K219 Gastro-esophageal reflux disease without esophagitis: Secondary | ICD-10-CM | POA: Diagnosis not present

## 2022-08-12 DIAGNOSIS — N179 Acute kidney failure, unspecified: Secondary | ICD-10-CM | POA: Diagnosis not present

## 2022-08-12 DIAGNOSIS — Z85118 Personal history of other malignant neoplasm of bronchus and lung: Secondary | ICD-10-CM | POA: Diagnosis not present

## 2022-08-12 DIAGNOSIS — Z87891 Personal history of nicotine dependence: Secondary | ICD-10-CM | POA: Diagnosis not present

## 2022-08-12 DIAGNOSIS — Z8673 Personal history of transient ischemic attack (TIA), and cerebral infarction without residual deficits: Secondary | ICD-10-CM | POA: Diagnosis not present

## 2022-08-12 DIAGNOSIS — Z8501 Personal history of malignant neoplasm of esophagus: Secondary | ICD-10-CM | POA: Diagnosis not present

## 2022-08-12 DIAGNOSIS — Z85038 Personal history of other malignant neoplasm of large intestine: Secondary | ICD-10-CM | POA: Diagnosis not present

## 2022-08-12 DIAGNOSIS — K920 Hematemesis: Secondary | ICD-10-CM | POA: Diagnosis not present

## 2022-08-12 DIAGNOSIS — Z885 Allergy status to narcotic agent status: Secondary | ICD-10-CM | POA: Diagnosis not present

## 2022-08-16 DIAGNOSIS — K219 Gastro-esophageal reflux disease without esophagitis: Secondary | ICD-10-CM | POA: Diagnosis not present

## 2022-08-16 DIAGNOSIS — Z91040 Latex allergy status: Secondary | ICD-10-CM | POA: Diagnosis not present

## 2022-08-16 DIAGNOSIS — C159 Malignant neoplasm of esophagus, unspecified: Secondary | ICD-10-CM | POA: Diagnosis not present

## 2022-08-16 DIAGNOSIS — Z882 Allergy status to sulfonamides status: Secondary | ICD-10-CM | POA: Diagnosis not present

## 2022-08-16 DIAGNOSIS — E785 Hyperlipidemia, unspecified: Secondary | ICD-10-CM | POA: Diagnosis not present

## 2022-08-16 DIAGNOSIS — K2289 Other specified disease of esophagus: Secondary | ICD-10-CM | POA: Diagnosis not present

## 2022-08-16 DIAGNOSIS — Z8673 Personal history of transient ischemic attack (TIA), and cerebral infarction without residual deficits: Secondary | ICD-10-CM | POA: Diagnosis not present

## 2022-08-16 DIAGNOSIS — Z885 Allergy status to narcotic agent status: Secondary | ICD-10-CM | POA: Diagnosis not present

## 2022-08-16 DIAGNOSIS — Z79899 Other long term (current) drug therapy: Secondary | ICD-10-CM | POA: Diagnosis not present

## 2022-08-16 DIAGNOSIS — Z7901 Long term (current) use of anticoagulants: Secondary | ICD-10-CM | POA: Diagnosis not present

## 2022-08-16 DIAGNOSIS — I10 Essential (primary) hypertension: Secondary | ICD-10-CM | POA: Diagnosis not present

## 2022-08-29 ENCOUNTER — Other Ambulatory Visit: Payer: Self-pay | Admitting: Nurse Practitioner

## 2022-08-29 DIAGNOSIS — Z1231 Encounter for screening mammogram for malignant neoplasm of breast: Secondary | ICD-10-CM

## 2022-09-27 ENCOUNTER — Ambulatory Visit
Admission: RE | Admit: 2022-09-27 | Discharge: 2022-09-27 | Disposition: A | Discharge status: Home / Self Care | Payer: Medicare HMO | Source: Ambulatory Visit | Attending: Nurse Practitioner | Admitting: Nurse Practitioner

## 2022-09-27 DIAGNOSIS — Z1231 Encounter for screening mammogram for malignant neoplasm of breast: Secondary | ICD-10-CM | POA: Insufficient documentation

## 2022-10-31 DIAGNOSIS — M2041 Other hammer toe(s) (acquired), right foot: Secondary | ICD-10-CM | POA: Diagnosis not present

## 2022-10-31 DIAGNOSIS — M19072 Primary osteoarthritis, left ankle and foot: Secondary | ICD-10-CM | POA: Diagnosis not present

## 2022-11-01 DIAGNOSIS — Z79899 Other long term (current) drug therapy: Secondary | ICD-10-CM | POA: Diagnosis not present

## 2022-11-01 DIAGNOSIS — R1310 Dysphagia, unspecified: Secondary | ICD-10-CM | POA: Diagnosis not present

## 2022-11-01 DIAGNOSIS — Z7982 Long term (current) use of aspirin: Secondary | ICD-10-CM | POA: Diagnosis not present

## 2022-11-01 DIAGNOSIS — R69 Illness, unspecified: Secondary | ICD-10-CM | POA: Diagnosis not present

## 2022-11-01 DIAGNOSIS — K222 Esophageal obstruction: Secondary | ICD-10-CM | POA: Diagnosis not present

## 2022-11-01 DIAGNOSIS — K2289 Other specified disease of esophagus: Secondary | ICD-10-CM | POA: Diagnosis not present

## 2022-11-01 DIAGNOSIS — Z9089 Acquired absence of other organs: Secondary | ICD-10-CM | POA: Diagnosis not present

## 2022-11-01 DIAGNOSIS — Z91040 Latex allergy status: Secondary | ICD-10-CM | POA: Diagnosis not present

## 2022-11-01 DIAGNOSIS — Z9049 Acquired absence of other specified parts of digestive tract: Secondary | ICD-10-CM | POA: Diagnosis not present

## 2022-11-01 DIAGNOSIS — Z880 Allergy status to penicillin: Secondary | ICD-10-CM | POA: Diagnosis not present

## 2022-11-01 DIAGNOSIS — I10 Essential (primary) hypertension: Secondary | ICD-10-CM | POA: Diagnosis not present

## 2022-11-01 DIAGNOSIS — Z888 Allergy status to other drugs, medicaments and biological substances status: Secondary | ICD-10-CM | POA: Diagnosis not present

## 2022-11-01 DIAGNOSIS — Z8673 Personal history of transient ischemic attack (TIA), and cerebral infarction without residual deficits: Secondary | ICD-10-CM | POA: Diagnosis not present

## 2022-11-01 DIAGNOSIS — I4891 Unspecified atrial fibrillation: Secondary | ICD-10-CM | POA: Diagnosis not present

## 2022-11-01 DIAGNOSIS — Z885 Allergy status to narcotic agent status: Secondary | ICD-10-CM | POA: Diagnosis not present

## 2022-11-01 DIAGNOSIS — K219 Gastro-esophageal reflux disease without esophagitis: Secondary | ICD-10-CM | POA: Diagnosis not present

## 2022-11-01 DIAGNOSIS — Z9889 Other specified postprocedural states: Secondary | ICD-10-CM | POA: Diagnosis not present

## 2022-11-01 DIAGNOSIS — Z90710 Acquired absence of both cervix and uterus: Secondary | ICD-10-CM | POA: Diagnosis not present

## 2022-12-02 DIAGNOSIS — Z8673 Personal history of transient ischemic attack (TIA), and cerebral infarction without residual deficits: Secondary | ICD-10-CM | POA: Diagnosis not present

## 2022-12-02 DIAGNOSIS — R69 Illness, unspecified: Secondary | ICD-10-CM | POA: Diagnosis not present

## 2022-12-02 DIAGNOSIS — R519 Headache, unspecified: Secondary | ICD-10-CM | POA: Diagnosis not present

## 2022-12-02 DIAGNOSIS — H918X3 Other specified hearing loss, bilateral: Secondary | ICD-10-CM | POA: Diagnosis not present

## 2022-12-02 DIAGNOSIS — I480 Paroxysmal atrial fibrillation: Secondary | ICD-10-CM | POA: Diagnosis not present

## 2022-12-13 DIAGNOSIS — I10 Essential (primary) hypertension: Secondary | ICD-10-CM | POA: Diagnosis not present

## 2022-12-13 DIAGNOSIS — J301 Allergic rhinitis due to pollen: Secondary | ICD-10-CM | POA: Diagnosis not present

## 2022-12-13 DIAGNOSIS — Z87891 Personal history of nicotine dependence: Secondary | ICD-10-CM | POA: Diagnosis not present

## 2022-12-13 DIAGNOSIS — I69354 Hemiplegia and hemiparesis following cerebral infarction affecting left non-dominant side: Secondary | ICD-10-CM | POA: Diagnosis not present

## 2022-12-13 DIAGNOSIS — I252 Old myocardial infarction: Secondary | ICD-10-CM | POA: Diagnosis not present

## 2022-12-13 DIAGNOSIS — R69 Illness, unspecified: Secondary | ICD-10-CM | POA: Diagnosis not present

## 2022-12-13 DIAGNOSIS — I4891 Unspecified atrial fibrillation: Secondary | ICD-10-CM | POA: Diagnosis not present

## 2022-12-13 DIAGNOSIS — K219 Gastro-esophageal reflux disease without esophagitis: Secondary | ICD-10-CM | POA: Diagnosis not present

## 2022-12-13 DIAGNOSIS — K5900 Constipation, unspecified: Secondary | ICD-10-CM | POA: Diagnosis not present

## 2022-12-13 DIAGNOSIS — E785 Hyperlipidemia, unspecified: Secondary | ICD-10-CM | POA: Diagnosis not present

## 2022-12-13 DIAGNOSIS — M1990 Unspecified osteoarthritis, unspecified site: Secondary | ICD-10-CM | POA: Diagnosis not present

## 2022-12-13 DIAGNOSIS — I672 Cerebral atherosclerosis: Secondary | ICD-10-CM | POA: Diagnosis not present

## 2023-01-04 DIAGNOSIS — H6122 Impacted cerumen, left ear: Secondary | ICD-10-CM | POA: Diagnosis not present

## 2023-01-04 DIAGNOSIS — H903 Sensorineural hearing loss, bilateral: Secondary | ICD-10-CM | POA: Diagnosis not present

## 2023-01-12 DIAGNOSIS — H903 Sensorineural hearing loss, bilateral: Secondary | ICD-10-CM | POA: Diagnosis not present

## 2023-01-12 DIAGNOSIS — H6122 Impacted cerumen, left ear: Secondary | ICD-10-CM | POA: Diagnosis not present

## 2023-01-24 DIAGNOSIS — Z23 Encounter for immunization: Secondary | ICD-10-CM | POA: Diagnosis not present

## 2023-01-24 DIAGNOSIS — E782 Mixed hyperlipidemia: Secondary | ICD-10-CM | POA: Diagnosis not present

## 2023-01-24 DIAGNOSIS — I480 Paroxysmal atrial fibrillation: Secondary | ICD-10-CM | POA: Diagnosis not present

## 2023-01-24 DIAGNOSIS — I10 Essential (primary) hypertension: Secondary | ICD-10-CM | POA: Diagnosis not present

## 2023-01-24 DIAGNOSIS — E119 Type 2 diabetes mellitus without complications: Secondary | ICD-10-CM | POA: Diagnosis not present

## 2023-01-24 DIAGNOSIS — Z0000 Encounter for general adult medical examination without abnormal findings: Secondary | ICD-10-CM | POA: Diagnosis not present

## 2023-01-24 DIAGNOSIS — Z8673 Personal history of transient ischemic attack (TIA), and cerebral infarction without residual deficits: Secondary | ICD-10-CM | POA: Diagnosis not present

## 2023-01-24 DIAGNOSIS — Z8501 Personal history of malignant neoplasm of esophagus: Secondary | ICD-10-CM | POA: Diagnosis not present

## 2023-02-13 ENCOUNTER — Ambulatory Visit
Admission: EM | Admit: 2023-02-13 | Discharge: 2023-02-13 | Disposition: A | Discharge status: Home / Self Care | Payer: Medicare HMO | Attending: Emergency Medicine | Admitting: Emergency Medicine

## 2023-02-13 DIAGNOSIS — S61213A Laceration without foreign body of left middle finger without damage to nail, initial encounter: Secondary | ICD-10-CM | POA: Diagnosis not present

## 2023-02-13 MED ORDER — CEPHALEXIN 500 MG PO CAPS: 500.0000 mg | ORAL_CAPSULE | Freq: Two times a day (BID) | ORAL | 0 refills | Status: AC

## 2023-02-13 NOTE — ED Provider Notes (Addendum)
[facility]    CSN: [id number] Arrival date & time: 02/13/23  1625      History   Chief Complaint Chief Complaint  Patient presents with   Laceration    HPI Kelly Krause is a 82 y.o. female.   Patient presents for evaluation of a laceration to the left middle finger occurring 3 days ago.  Finger was cut on X-Acto knife.  Endorses slight bleeds intermittently and is tender to touch.  Full range of motion of the finger.  Denies swelling, drainage or fevers.  Currently taking Eliquis.  Last tetanus within the last 1 to 2 years.   Past Medical History:  Diagnosis Date   Allergy    Atrial fibrillation ([place])    Cancer ([place])    esophagus/chemo and rad   Colon cancer ([place])    chemo/rad 15 yrs   Colon cancer ([place])    2 yrs ago   DDD (degenerative disc disease), cervical    Dysrhythmia    Elevated lipids    GERD (gastroesophageal reflux disease)    Hyperlipidemia    Hypertension    Lung cancer ([place])    chemo   Lung cancer ([place])    Major depressive disorder, recurrent episode, moderate (HCC)    Persistent disorder of initiating or maintaining sleep    Personal history of chemotherapy    Personal history of radiation therapy    colon   Stroke ([place]) 06/03/2021   No Deficits   SVT (supraventricular tachycardia)     Patient Active Problem List   Diagnosis Date Noted   Acute CVA (cerebrovascular accident) ([place]) 06/04/2021   Stroke ([place]) 06/03/2021   Hypertensive urgency 06/03/2021   Sinus bradycardia 06/03/2021   Cancer of cecum ([place]) 10/04/2017   Normocytic anemia 10/04/2017   A-fib ([place]) 06/16/2015   Cancer of colon ([place]) 06/16/2015   DD (diverticular disease) 06/16/2015   Esophageal ulcer 06/16/2015   Hallux abducto valgus 06/16/2015   Hammer toe 06/16/2015   H/O varicella 06/16/2015   HK (hyperkeratosis) 06/16/2015   BP (high blood pressure) 06/16/2015   Neuritis or radiculitis due to rupture of lumbar intervertebral disc 06/16/2015   Cancer of  lung ([place]) 06/16/2015   Degenerative arthritis of spine 06/16/2015   SCC (squamous cell carcinoma of esophagus) ([place]) 06/16/2015   Supraventricular tachycardia ([place]) 06/16/2015   Temporary cerebral vascular dysfunction 06/16/2015   Familial multiple lipoprotein-type hyperlipidemia 03/27/2015   Clinical depression 03/27/2015   Acid reflux 03/27/2015   CN (constipation) 03/27/2015   Ulcerative cystitis 03/27/2015   Routine general medical examination at a health care facility 03/27/2015   Allergic rhinitis 03/27/2015   H/O malignant neoplasm 03/27/2015   Contusion of knee and lower leg 03/27/2015   Narrowing of intervertebral disc space 03/27/2015   DDD (degenerative disc disease), cervical 03/27/2015   DDD (degenerative disc disease), lumbar 03/27/2015   H/O: depression 03/27/2015   Can't get food down 03/27/2015   Essential (primary) hypertension 03/27/2015   Cephalalgia 03/27/2015   Discitis of lumbar region 03/27/2015   Blood in the urine 03/27/2015   H/O: HTN (hypertension) 03/27/2015   Menopause 03/27/2015   Screening for depression 03/27/2015   Sinus infection 03/27/2015   FOM (frequency of micturition) 03/27/2015   Arrhythmia, ventricular 03/27/2015   Encounter for mammogram to establish baseline mammogram 03/27/2015   Atrial tachycardia 03/27/2015   Lumbar and sacral osteoarthritis 11/03/2014   Atrial fibrillation ([place]) 09/18/2014   Cancer of esophagus ([place]) 06/25/2014   Malignant neoplasm of esophagus (  [place]) 06/25/2014    Past Surgical History:  Procedure Laterality Date   ABDOMINAL HYSTERECTOMY     ATRIAL FIBRILLATION ABLATION     BROW LIFT Bilateral 01/07/2022   Procedure: BLEPHAROPLASTY UPPER EYELID; W/ EXCESS SKIN BILATERAL;  Surgeon: Karle Starch, MD;  Location: [facility];  Service: Ophthalmology;  Laterality: Bilateral;   BUNIONECTOMY     CAPSULOTOMY METATARSOPHALANGEAL Left 04/19/2017   Procedure: CAPSULOTOMY METATARSOPHALANGEAL;  Surgeon: Samara Deist, DPM;  Location: [facility];  Service: Podiatry;  Laterality: Left;   CATARACT EXTRACTION W/PHACO Right 08/16/2021   Procedure: CATARACT EXTRACTION PHACO AND INTRAOCULAR LENS PLACEMENT (IOC) RIGHT Eyhance Toric;  Surgeon: Eulogio Bear, MD;  Location: [facility];  Service: Ophthalmology;  Laterality: Right;  5.02 00:33.8   CATARACT EXTRACTION W/PHACO Left 08/30/2021   Procedure: CATARACT EXTRACTION PHACO AND INTRAOCULAR LENS PLACEMENT (IOC) LEFT 5.10 00:43.4;  Surgeon: Eulogio Bear, MD;  Location: [facility];  Service: Ophthalmology;  Laterality: Left;   COLECTOMY     COLONOSCOPY WITH PROPOFOL N/A 09/26/2017   Procedure: COLONOSCOPY WITH PROPOFOL;  Surgeon: Lollie Sails, MD;  Location: [facility] ENDOSCOPY;  Service: Endoscopy;  Laterality: N/A;   ESOPHAGOGASTRODUODENOSCOPY N/A 04/21/2015   Procedure: ESOPHAGOGASTRODUODENOSCOPY (EGD);  Surgeon: Lollie Sails, MD;  Location[facility] ENDOSCOPY;  Service: Endoscopy;  Laterality: N/A;   Esophgeal cancer     HAMMER TOE SURGERY Left 04/19/2017   Procedure: HAMMER TOE CORRECTION-3RD & 4TH;  Surgeon: Samara Deist, DPM;  Location: [facility];  Service: Podiatry;  Laterality: Left;   LOBECTOMY Right    METATARSAL OSTEOTOMY Left 04/19/2017   Procedure: PHALANX OSTEOTOMY-AKIN - LEFT;  Surgeon: Samara Deist, DPM;  Location: [facility];  Service: Podiatry;  Laterality: Left;   PARTIAL HYSTERECTOMY     SINUSOTOMY      OB History   No obstetric history on file.      Home Medications    Prior to Admission medications   Medication Sig Start Date End Date Taking? Authorizing Provider  cephALEXin (KEFLEX) 500 MG capsule Take 1 capsule (500 mg total) by mouth 2 (two) times daily for 5 days. 02/13/23 02/18/23 Yes Jassica Zazueta R, NP  amLODipine (NORVASC) 10 MG tablet TAKE ONE TABLET BY MOUTH ONCE DAILY-NEED  APPOINTMENT Patient taking differently: Take 10 mg by mouth daily. 11/01/16   Juline Patch, MD  apixaban (ELIQUIS) 5 MG TABS tablet Take 1 tablet (5 mg total) by mouth 2 (two) times daily. 06/21/21 11/05/21  Sidney Ace, MD  atorvastatin (LIPITOR) 40 MG tablet Take 1 tablet (40 mg total) by mouth daily. 06/06/21 01/07/22  Sidney Ace, MD  BIOTIN PO Take 1 tablet by mouth daily.    [provider]  buPROPion (WELLBUTRIN SR) 150 MG 12 hr tablet Take 1 tablet (150 mg total) by mouth 2 (two) times daily. 11/01/16   Juline Patch, MD  Cholecalciferol (VITAMIN D-3) 1000 UNITS CAPS Take 1,000 Units daily by mouth.     [provider]  diphenhydrAMINE (BENADRYL) 25 mg capsule Take 50-75 mg every 6 (six) hours as needed by mouth for itching.     [provider]  erythromycin ophthalmic ointment Apply to sutures 4 times a day for 10-12 days.  Discontinue if allergy develops and call our office 01/07/22   Karle Starch, MD  fexofenadine (ALLEGRA) 180 MG tablet Take 180 mg by mouth at bedtime.    [provider]  fluticasone (FLONASE) 50 MCG/ACT  nasal spray USE ONE SPRAY(S) IN EACH NOSTRIL ONCE DAILY Patient taking differently: Place 2 sprays into both nostrils daily. 11/01/16   Juline Patch, MD  hydrochlorothiazide (MICROZIDE) 12.5 MG capsule Take 1 capsule (12.5 mg total) by mouth daily. 06/07/21 01/07/22  Sidney Ace, MD  Multiple Vitamins-Minerals (MULTIVITAMIN PO) Take 1 tablet daily by mouth.    [provider]  Multiple Vitamins-Minerals (OCUVITE ADULT 50+ PO) Take 1 tablet by mouth daily.    [provider]  nitrofurantoin, macrocrystal-monohydrate, (MACROBID) 100 MG capsule Take 1 capsule (100 mg total) by mouth 2 (two) times daily. Patient not taking: Reported on 12/30/2021 11/05/21   Margarette Canada, NP  pantoprazole (PROTONIX) 40 MG tablet Take 1 tablet (40 mg total) by mouth 2 (two) times daily. 11/01/16   Juline Patch, MD  phenazopyridine (PYRIDIUM) 200 MG tablet Take 1 tablet (200 mg total) by mouth 3  (three) times daily. Patient not taking: Reported on 12/30/2021 11/05/21   Margarette Canada, NP  ramipril (ALTACE) 10 MG capsule TAKE ONE CAPSULE BY MOUTH ONCE DAILY. MUST SCHEDULE APPOINTMENT FOR NOVEMBER 11/01/16   Juline Patch, MD  senna-docusate (SENOKOT-S) 8.6-50 MG tablet Take 2 tablets by mouth at bedtime.    [provider]  sertraline (ZOLOFT) 25 MG tablet Take 1 tablet (25 mg total) by mouth daily. Patient taking differently: Take 100 mg by mouth daily. 11/01/16   Juline Patch, MD  sotalol (BETAPACE) 80 MG tablet Take 1 tablet (80 mg total) by mouth 2 (two) times daily. 06/06/21 01/07/22  Sidney Ace, MD  sucralfate (CARAFATE) 1 GM/10ML suspension Take by mouth. Cancer Doc 10/20/15 07/09/19  [provider]    Family History Family History  Problem Relation Age of Onset   Stroke Mother    Depression Mother    Breast cancer Mother 79   Breast cancer Maternal Aunt 34   Alcohol abuse Father    Stroke Father    Hypertension Father     Social History Social History   Tobacco Use   Smoking status: Former   Smokeless tobacco: Never  Scientific laboratory technician Use: Never used  Substance Use Topics   Alcohol use: Yes    Comment: 3 none last 24hrs   Drug use: No     Allergies   Amoxicillin-pot clavulanate, Codeine, Demerol [meperidine], Latex, Morphine and related, Oxycodone-acetaminophen, Tramadol, Buprenorphine hcl, Fentanyl, Strawberry extract, and Tapentadol   Review of Systems Review of Systems   Physical Exam Triage Vital Signs ED Triage Vitals  Enc Vitals Group     BP 02/13/23 1718 (!) 167/77     Pulse Rate 02/13/23 1716 (!) 57     Resp 02/13/23 1716 18     Temp 02/13/23 1716 98.3 F (36.8 C)     Temp Source 02/13/23 1716 Oral     SpO2 02/13/23 1716 98 %     Weight --      Height --      Head Circumference --      Peak Flow --      Pain Score 02/13/23 1715 3     Pain Loc --      Pain Edu? --      Excl. in [place]? --    No data  found.  Updated Vital Signs BP (!) 167/77   Pulse (!) 57   Temp 98.3 F (36.8 C) (Oral)   Resp 18   SpO2 98%   Visual Acuity Right Eye  Distance:   Left Eye Distance:   Bilateral Distance:    Right Eye Near:   Left Eye Near:    Bilateral Near:     Physical Exam Constitutional:      Appearance: Normal appearance.  Eyes:     Extraocular Movements: Extraocular movements intact.  Pulmonary:     Effort: Pulmonary effort is normal.  Musculoskeletal:     Comments: Superficial 1 x 1 cm laceration present to the palmar aspect of the proximal phalanx of the left middle finger, no involvement of the nail or joint, sensation intact, capillary refills less than 3  Neurological:     Mental Status: She is alert and oriented to person, place, and time. Mental status is at baseline.      UC Treatments / Results  Labs (all labs ordered are listed, but only abnormal results are displayed) Labs Reviewed - No data to display  EKG   Radiology No results found.  Procedures Laceration Repair  Date/Time: 02/13/2023 6:02 PM  Performed by: Hans Eden, NP Authorized by: Hans Eden, NP   Consent:    Consent obtained:  Verbal   Consent given by:  Patient   Risks, benefits, and alternatives were discussed: yes     Risks discussed:  Infection Universal protocol:    Procedure explained and questions answered to patient or proxy's satisfaction: yes     Patient identity confirmed:  Verbally with patient Anesthesia:    Anesthesia method:  None Laceration details:    Location:  Finger   Finger location:  L long finger   Length (cm):  1 Pre-procedure details:    Preparation:  Patient was prepped and draped in usual sterile fashion Exploration:    Wound exploration: entire depth of wound visualized   Treatment:    Area cleansed with:  Chlorhexidine   Amount of cleaning:  Standard Skin repair:    Repair method:  Steri-Strips   Number of Steri-Strips:  4 Approximation:     Approximation:  Loose Repair type:    Repair type:  Simple Post-procedure details:    Dressing:  Non-adherent dressing   Procedure completion:  Tolerated  (including critical care time)  Medications Ordered in UC Medications - No data to display  Initial Impression / Assessment and Plan / UC Course  I have reviewed the triage vital signs and the nursing notes.  Pertinent labs & imaging results that were available during my care of the patient were reviewed by me and considered in my medical decision making (see chart for details).  Laceration of left middle finger without foreign body without damage to nail, initial encounter  Due to timeline of illness unable to suture, discussed with patient and spouse, cleaned with chlorhexidine and Steri-Strips applied, approximated loosely , wound not closed ,Keflex prescribed prophylactically, advised follow-up regarding any concerns with healing Final Clinical Impressions(s) / UC Diagnoses   Final diagnoses:  Laceration of left middle finger without foreign body without damage to nail, initial encounter     Discharge Instructions      Unfortunately unable to closure laceration as it is been past 24 hours, it may take 1 to 2 weeks to fully heal  You are currently up-to-date on your tetanus injection and will not need a new one  Laceration has been cleaned here in our office and covered with Steri-Strips, these will fall off with time, they will begin to peel and curl up, you may trim the edges but do not pull off,  allow to fall off completely  Begin Keflex every morning and every evening for 5 days to prevent infection  May follow-up with urgent care or primary doctor for any concerns regarding healing   ED Prescriptions     Medication Sig Dispense Auth. Provider   cephALEXin (KEFLEX) 500 MG capsule Take 1 capsule (500 mg total) by mouth 2 (two) times daily for 5 days. 10 capsule Hans Eden, NP      PDMP not reviewed  this encounter.   Hans Eden, NP 02/13/23 1801    Hans Eden, NP 02/13/23 [id number]

## 2023-02-13 NOTE — ED Triage Notes (Signed)
Pt presents with a cut to the left middle finger. States it happened Friday. Pt cut her finger with a knife.

## 2023-02-13 NOTE — Discharge Instructions (Addendum)
Unfortunately unable to closure laceration as it is been past 24 hours, it may take 1 to 2 weeks to fully heal  You are currently up-to-date on your tetanus injection and will not need a new one  Laceration has been cleaned here in our office and covered with Steri-Strips, these will fall off with time, they will begin to peel and curl up, you may trim the edges but do not pull off, allow to fall off completely  Begin Keflex every morning and every evening for 5 days to prevent infection  May follow-up with urgent care or primary doctor for any concerns regarding healing

## 2023-03-02 ENCOUNTER — Encounter: Payer: Self-pay | Admitting: Student

## 2023-03-02 ENCOUNTER — Other Ambulatory Visit: Payer: Self-pay | Admitting: Student

## 2023-03-02 DIAGNOSIS — Z8673 Personal history of transient ischemic attack (TIA), and cerebral infarction without residual deficits: Secondary | ICD-10-CM | POA: Diagnosis not present

## 2023-03-02 DIAGNOSIS — R69 Illness, unspecified: Secondary | ICD-10-CM | POA: Diagnosis not present

## 2023-03-02 DIAGNOSIS — R2689 Other abnormalities of gait and mobility: Secondary | ICD-10-CM | POA: Diagnosis not present

## 2023-03-02 DIAGNOSIS — R296 Repeated falls: Secondary | ICD-10-CM | POA: Diagnosis not present

## 2023-03-02 DIAGNOSIS — R519 Headache, unspecified: Secondary | ICD-10-CM | POA: Diagnosis not present

## 2023-03-03 DIAGNOSIS — C159 Malignant neoplasm of esophagus, unspecified: Secondary | ICD-10-CM | POA: Diagnosis not present

## 2023-03-03 DIAGNOSIS — I471 Supraventricular tachycardia, unspecified: Secondary | ICD-10-CM | POA: Diagnosis not present

## 2023-03-03 DIAGNOSIS — I10 Essential (primary) hypertension: Secondary | ICD-10-CM | POA: Diagnosis not present

## 2023-03-03 DIAGNOSIS — E782 Mixed hyperlipidemia: Secondary | ICD-10-CM | POA: Diagnosis not present

## 2023-03-03 DIAGNOSIS — K219 Gastro-esophageal reflux disease without esophagitis: Secondary | ICD-10-CM | POA: Diagnosis not present

## 2023-03-03 DIAGNOSIS — E119 Type 2 diabetes mellitus without complications: Secondary | ICD-10-CM | POA: Diagnosis not present

## 2023-03-03 DIAGNOSIS — I480 Paroxysmal atrial fibrillation: Secondary | ICD-10-CM | POA: Diagnosis not present

## 2023-03-10 ENCOUNTER — Ambulatory Visit
Admission: RE | Admit: 2023-03-10 | Discharge: 2023-03-10 | Disposition: A | Discharge status: Home / Self Care | Payer: Medicare HMO | Source: Ambulatory Visit | Attending: Student | Admitting: Student

## 2023-03-10 DIAGNOSIS — G9389 Other specified disorders of brain: Secondary | ICD-10-CM | POA: Diagnosis not present

## 2023-03-10 DIAGNOSIS — R296 Repeated falls: Secondary | ICD-10-CM | POA: Diagnosis not present

## 2023-03-10 DIAGNOSIS — Z8673 Personal history of transient ischemic attack (TIA), and cerebral infarction without residual deficits: Secondary | ICD-10-CM | POA: Insufficient documentation

## 2023-03-20 DIAGNOSIS — M47812 Spondylosis without myelopathy or radiculopathy, cervical region: Secondary | ICD-10-CM | POA: Diagnosis not present

## 2023-03-20 DIAGNOSIS — M5136 Other intervertebral disc degeneration, lumbar region: Secondary | ICD-10-CM | POA: Diagnosis not present

## 2023-03-20 DIAGNOSIS — M50323 Other cervical disc degeneration at C6-C7 level: Secondary | ICD-10-CM | POA: Diagnosis not present

## 2023-03-20 DIAGNOSIS — M5030 Other cervical disc degeneration, unspecified cervical region: Secondary | ICD-10-CM | POA: Diagnosis not present

## 2023-03-20 DIAGNOSIS — M5137 Other intervertebral disc degeneration, lumbosacral region: Secondary | ICD-10-CM | POA: Diagnosis not present

## 2023-03-20 DIAGNOSIS — M5416 Radiculopathy, lumbar region: Secondary | ICD-10-CM | POA: Diagnosis not present

## 2023-03-28 ENCOUNTER — Other Ambulatory Visit: Payer: Self-pay | Admitting: Family Medicine

## 2023-03-28 DIAGNOSIS — M5416 Radiculopathy, lumbar region: Secondary | ICD-10-CM

## 2023-03-28 DIAGNOSIS — M5412 Radiculopathy, cervical region: Secondary | ICD-10-CM

## 2023-05-22 ENCOUNTER — Ambulatory Visit
Admission: RE | Admit: 2023-05-22 | Discharge: 2023-05-22 | Disposition: A | Discharge status: Home / Self Care | Payer: Medicare HMO | Source: Ambulatory Visit | Attending: Family Medicine | Admitting: Family Medicine

## 2023-05-22 DIAGNOSIS — M4722 Other spondylosis with radiculopathy, cervical region: Secondary | ICD-10-CM | POA: Diagnosis not present

## 2023-05-22 DIAGNOSIS — M4726 Other spondylosis with radiculopathy, lumbar region: Secondary | ICD-10-CM | POA: Diagnosis not present

## 2023-05-22 DIAGNOSIS — M5412 Radiculopathy, cervical region: Secondary | ICD-10-CM

## 2023-05-22 DIAGNOSIS — M5416 Radiculopathy, lumbar region: Secondary | ICD-10-CM

## 2023-05-24 ENCOUNTER — Ambulatory Visit (INDEPENDENT_AMBULATORY_CARE_PROVIDER_SITE_OTHER): Payer: Medicare HMO

## 2023-05-24 ENCOUNTER — Ambulatory Visit
Admission: EM | Admit: 2023-05-24 | Discharge: 2023-05-24 | Disposition: A | Discharge status: Home / Self Care | Payer: Medicare HMO

## 2023-05-24 DIAGNOSIS — R051 Acute cough: Secondary | ICD-10-CM

## 2023-05-24 DIAGNOSIS — R062 Wheezing: Secondary | ICD-10-CM | POA: Diagnosis not present

## 2023-05-24 DIAGNOSIS — R0982 Postnasal drip: Secondary | ICD-10-CM | POA: Diagnosis not present

## 2023-05-24 DIAGNOSIS — R918 Other nonspecific abnormal finding of lung field: Secondary | ICD-10-CM | POA: Diagnosis not present

## 2023-05-24 DIAGNOSIS — R0602 Shortness of breath: Secondary | ICD-10-CM | POA: Diagnosis not present

## 2023-05-24 DIAGNOSIS — R059 Cough, unspecified: Secondary | ICD-10-CM | POA: Diagnosis not present

## 2023-05-24 MED ORDER — BENZONATATE 100 MG PO CAPS: 200.0000 mg | ORAL_CAPSULE | Freq: Three times a day (TID) | ORAL | 0 refills | Status: DC

## 2023-05-24 MED ORDER — IPRATROPIUM BROMIDE 0.06 % NA SOLN: 2.0000 | Freq: Four times a day (QID) | NASAL | 12 refills | Status: DC

## 2023-05-24 NOTE — ED Triage Notes (Signed)
Pt c/o cough & wheezing x2 wks. Denies any hx of asthma or COPD. Has tried cough syrup w/o relief.

## 2023-05-24 NOTE — Discharge Instructions (Addendum)
Your chest x-ray does not demonstrate any evidence of pneumonia.  Your physical exam does reveal inflamed nasal mucosa with clear nasal discharge and clear postnasal drip in the back of your throat.  I suspect that this postnasal drip is what is triggering her cough, especially given that your cough is worse when you lay down at night.  Use the Atrovent nasal spray, 2 squirts up each nostril every 6 hours, as needed for runny nose and postnasal drip.  Use the Tessalon Perles every 8 hours during the day as needed for cough.  Take them with a small sip of water.  You may experience numbness to the base of your tongue or metallic taste in her mouth, this is normal.  If your symptoms or not improving, new symptoms develop, or your symptoms worsen to return for reevaluation or see your primary care provider.

## 2023-05-24 NOTE — ED Provider Notes (Signed)
[facility]    CSN: [id number] Arrival date & time: 05/24/23  1409      History   Chief Complaint Chief Complaint  Patient presents with   Cough   Wheezing         HPI Kelly Krause is a 82 y.o. female.   HPI  82 year old female with a past medical history significant for esophageal cancer, atrial fibrillation, colon cancer, dysrhythmia, GERD, hypertension, and lung cancer presents for evaluation of 2 weeks worth of nonproductive cough with shortness of breath and wheezing.  She reports that on rare occasions she will get a small amount of.  She does endorse a runny nose as well.  Her cough is worse when she lays down at night.  She denies fever.  Past Medical History:  Diagnosis Date   Allergy    Atrial fibrillation (HCC)    Cancer (HCC)    esophagus/chemo and rad   Colon cancer (HCC)    chemo/rad 15 yrs   Colon cancer (HCC)    2 yrs ago   DDD (degenerative disc disease), cervical    Dysrhythmia    Elevated lipids    GERD (gastroesophageal reflux disease)    Hyperlipidemia    Hypertension    Lung cancer (HCC)    chemo   Lung cancer (HCC)    Major depressive disorder, recurrent episode, moderate (HCC)    Persistent disorder of initiating or maintaining sleep    Personal history of chemotherapy    Personal history of radiation therapy    colon   Stroke (HCC) 06/03/2021   No Deficits   SVT (supraventricular tachycardia)     Patient Active Problem List   Diagnosis Date Noted   Acute CVA (cerebrovascular accident) (HCC) 06/04/2021   Stroke (HCC) 06/03/2021   Hypertensive urgency 06/03/2021   Sinus bradycardia 06/03/2021   Cancer of cecum (HCC) 10/04/2017   Normocytic anemia 10/04/2017   A-fib (HCC) 06/16/2015   Cancer of colon (HCC) 06/16/2015   DD (diverticular disease) 06/16/2015   Esophageal ulcer 06/16/2015   Hallux abducto valgus 06/16/2015   Hammer toe 06/16/2015   H/O varicella 06/16/2015   HK (hyperkeratosis) 06/16/2015   BP  (high blood pressure) 06/16/2015   Neuritis or radiculitis due to rupture of lumbar intervertebral disc 06/16/2015   Cancer of lung (HCC) 06/16/2015   Degenerative arthritis of spine 06/16/2015   SCC (squamous cell carcinoma of esophagus) (HCC) 06/16/2015   Supraventricular tachycardia (HCC) 06/16/2015   Temporary cerebral vascular dysfunction 06/16/2015   Familial multiple lipoprotein-type hyperlipidemia 03/27/2015   Clinical depression 03/27/2015   Acid reflux 03/27/2015   CN (constipation) 03/27/2015   Ulcerative cystitis 03/27/2015   Routine general medical examination at a health care facility 03/27/2015   Allergic rhinitis 03/27/2015   H/O malignant neoplasm 03/27/2015   Contusion of knee and lower leg 03/27/2015   Narrowing of intervertebral disc space 03/27/2015   DDD (degenerative disc disease), cervical 03/27/2015   DDD (degenerative disc disease), lumbar 03/27/2015   H/O: depression 03/27/2015   Can't get food down 03/27/2015   Essential (primary) hypertension 03/27/2015   Cephalalgia 03/27/2015   Discitis of lumbar region 03/27/2015   Blood in the urine 03/27/2015   H/O: HTN (hypertension) 03/27/2015   Menopause 03/27/2015   Screening for depression 03/27/2015   Sinus infection 03/27/2015   FOM (frequency of micturition) 03/27/2015   Arrhythmia, ventricular 03/27/2015   Encounter for mammogram to establish baseline mammogram 03/27/2015   Atrial tachycardia 03/27/2015   Lumbar  and sacral osteoarthritis 11/03/2014   Atrial fibrillation (HCC) 09/18/2014   Cancer of esophagus (HCC) 06/25/2014   Malignant neoplasm of esophagus (HCC) 06/25/2014    Past Surgical History:  Procedure Laterality Date   ABDOMINAL HYSTERECTOMY     ATRIAL FIBRILLATION ABLATION     BROW LIFT Bilateral 01/07/2022   Procedure: BLEPHAROPLASTY UPPER EYELID; W/ EXCESS SKIN BILATERAL;  Surgeon: Imagene Riches, MD;  Location: [facility];  Service: Ophthalmology;  Laterality: Bilateral;    BUNIONECTOMY     CAPSULOTOMY METATARSOPHALANGEAL Left 04/19/2017   Procedure: CAPSULOTOMY METATARSOPHALANGEAL;  Surgeon: Gwyneth Revels, DPM;  Location: [facility];  Service: Podiatry;  Laterality: Left;   CATARACT EXTRACTION W/PHACO Right 08/16/2021   Procedure: CATARACT EXTRACTION PHACO AND INTRAOCULAR LENS PLACEMENT (IOC) RIGHT Eyhance Toric;  Surgeon: Nevada Crane, MD;  Location: [facility];  Service: Ophthalmology;  Laterality: Right;  5.02 00:33.8   CATARACT EXTRACTION W/PHACO Left 08/30/2021   Procedure: CATARACT EXTRACTION PHACO AND INTRAOCULAR LENS PLACEMENT (IOC) LEFT 5.10 00:43.4;  Surgeon: Nevada Crane, MD;  Location: [facility];  Service: Ophthalmology;  Laterality: Left;   COLECTOMY     COLONOSCOPY WITH PROPOFOL N/A 09/26/2017   Procedure: COLONOSCOPY WITH PROPOFOL;  Surgeon: Christena Deem, MD;  Location: [facility];  Service: Endoscopy;  Laterality: N/A;   ESOPHAGOGASTRODUODENOSCOPY N/A 04/21/2015   Procedure: ESOPHAGOGASTRODUODENOSCOPY (EGD);  Surgeon: Christena Deem, MD;  Location: [facility];  Service: Endoscopy;  Laterality: N/A;   Esophgeal cancer     HAMMER TOE SURGERY Left 04/19/2017   Procedure: HAMMER TOE CORRECTION-3RD & 4TH;  Surgeon: Gwyneth Revels, DPM;  Location: [facility];  Service: Podiatry;  Laterality: Left;   LOBECTOMY Right    METATARSAL OSTEOTOMY Left 04/19/2017   Procedure: PHALANX OSTEOTOMY-AKIN - LEFT;  Surgeon: Gwyneth Revels, DPM;  Location: [facility];  Service: Podiatry;  Laterality: Left;   PARTIAL HYSTERECTOMY     SINUSOTOMY      OB History   No obstetric history on file.      Home Medications    Prior to Admission medications   Medication Sig Start Date End Date Taking? Authorizing Provider  amLODipine (NORVASC) 10 MG tablet TAKE ONE TABLET BY MOUTH ONCE DAILY-NEED  APPOINTMENT Patient taking differently: Take 10 mg by mouth daily. 11/01/16  Yes Duanne Limerick, MD   apixaban (ELIQUIS) 5 MG TABS tablet Take 1 tablet (5 mg total) by mouth 2 (two) times daily. 06/21/21 05/24/23 Yes Sreenath, Sudheer B, MD  atorvastatin (LIPITOR) 40 MG tablet Take 1 tablet (40 mg total) by mouth daily. 06/06/21 05/24/23 Yes Sreenath, Sudheer B, MD  benzonatate (TESSALON) 100 MG capsule Take 2 capsules (200 mg total) by mouth every 8 (eight) hours. 05/24/23  Yes Becky Augusta, NP  BIOTIN PO Take 1 tablet by mouth daily.   Yes [provider]  buPROPion (WELLBUTRIN SR) 150 MG 12 hr tablet Take 1 tablet (150 mg total) by mouth 2 (two) times daily. 11/01/16  Yes Duanne Limerick, MD  Cholecalciferol (VITAMIN D-3) 1000 UNITS CAPS Take 1,000 Units daily by mouth.    Yes [provider]  diltiazem (CARDIZEM) 30 MG tablet Take 1 tablet by mouth 4 (four) times daily as needed. 04/10/23  Yes [provider]  diphenhydrAMINE (BENADRYL) 25 mg capsule Take 50-75 mg every 6 (six) hours as needed by mouth for itching.    Yes [provider]  erythromycin ophthalmic ointment Apply to sutures 4 times a day for  10-12 days.  Discontinue if allergy develops and call our office 01/07/22  Yes Imagene Riches, MD  fexofenadine (ALLEGRA) 180 MG tablet Take 180 mg by mouth at bedtime.   Yes [provider]  fluticasone (FLONASE) 50 MCG/ACT nasal spray USE ONE SPRAY(S) IN EACH NOSTRIL ONCE DAILY Patient taking differently: Place 2 sprays into both nostrils daily. 11/01/16  Yes Duanne Limerick, MD  hydrochlorothiazide (MICROZIDE) 12.5 MG capsule Take 1 capsule (12.5 mg total) by mouth daily. 06/07/21 05/24/23 Yes Sreenath, Sudheer B, MD  ipratropium (ATROVENT) 0.06 % nasal spray Place 2 sprays into both nostrils 4 (four) times daily. 05/24/23  Yes Becky Augusta, NP  Multiple Vitamins-Minerals (MULTIVITAMIN PO) Take 1 tablet daily by mouth.   Yes [provider]  Multiple Vitamins-Minerals (OCUVITE ADULT 50+ PO) Take 1 tablet by mouth daily.   Yes [provider]   nitrofurantoin, macrocrystal-monohydrate, (MACROBID) 100 MG capsule Take 1 capsule (100 mg total) by mouth 2 (two) times daily. 11/05/21  Yes Becky Augusta, NP  nortriptyline (PAMELOR) 10 MG capsule Take by mouth. 04/18/23  Yes [provider]  pantoprazole (PROTONIX) 40 MG tablet Take 1 tablet (40 mg total) by mouth 2 (two) times daily. 11/01/16  Yes Duanne Limerick, MD  phenazopyridine (PYRIDIUM) 200 MG tablet Take 1 tablet (200 mg total) by mouth 3 (three) times daily. 11/05/21  Yes Becky Augusta, NP  ramipril (ALTACE) 10 MG capsule TAKE ONE CAPSULE BY MOUTH ONCE DAILY. MUST SCHEDULE APPOINTMENT FOR NOVEMBER 11/01/16  Yes Duanne Limerick, MD  senna-docusate (SENOKOT-S) 8.6-50 MG tablet Take 2 tablets by mouth at bedtime.   Yes [provider]  sertraline (ZOLOFT) 25 MG tablet Take 1 tablet (25 mg total) by mouth daily. Patient taking differently: Take 100 mg by mouth daily. 11/01/16  Yes Duanne Limerick, MD  sotalol (BETAPACE) 80 MG tablet Take 1 tablet (80 mg total) by mouth 2 (two) times daily. 06/06/21 05/24/23 Yes Sreenath, Sudheer B, MD  venlafaxine XR (EFFEXOR-XR) 150 MG 24 hr capsule Take 150 mg by mouth daily.   Yes [provider]  sucralfate (CARAFATE) 1 GM/10ML suspension Take by mouth. Cancer Doc 10/20/15 07/09/19  [provider]    Family History Family History  Problem Relation Age of Onset   Stroke Mother    Depression Mother    Breast cancer Mother 65   Breast cancer Maternal Aunt 49   Alcohol abuse Father    Stroke Father    Hypertension Father     Social History Social History   Tobacco Use   Smoking status: Former   Smokeless tobacco: Never  Building services engineer Use: Never used  Substance Use Topics   Alcohol use: Yes    Comment: 3 none last 24hrs   Drug use: No     Allergies   Amoxicillin-pot clavulanate, Codeine, Demerol [meperidine], Doxycycline, Latex, Morphine and codeine, Oxycodone-acetaminophen, Tramadol,  Buprenorphine hcl, Fentanyl, Strawberry extract, and Tapentadol   Review of Systems Review of Systems  Constitutional:  Negative for fever.  HENT:  Positive for congestion and rhinorrhea.   Respiratory:  Positive for cough, shortness of breath and wheezing.      Physical Exam Triage Vital Signs ED Triage Vitals  Enc Vitals Group     BP 05/24/23 1418 (!) 145/65     Pulse Rate 05/24/23 1418 70     Resp 05/24/23 1418 16     Temp 05/24/23 1418 97.7 F (36.5 C)  Temp Source 05/24/23 1418 Oral     SpO2 05/24/23 1418 100 %     Weight 05/24/23 1416 165 lb (74.8 kg)     Height 05/24/23 1416 5\' 7"  (1.702 m)     Head Circumference --      Peak Flow --      Pain Score 05/24/23 1420 3     Pain Loc --      Pain Edu? --      Excl. in GC? --    No data found.  Updated Vital Signs BP (!) 145/65 (BP Location: Left Arm)   Pulse 70   Temp 97.7 F (36.5 C) (Oral)   Resp 16   Ht 5\' 7"  (1.702 m)   Wt 165 lb (74.8 kg)   SpO2 100%   BMI 25.84 kg/m   Visual Acuity Right Eye Distance:   Left Eye Distance:   Bilateral Distance:    Right Eye Near:   Left Eye Near:    Bilateral Near:     Physical Exam Vitals and nursing note reviewed.  Constitutional:      Appearance: Normal appearance. She is not ill-appearing.  HENT:     Head: Normocephalic and atraumatic.     Nose: Congestion and rhinorrhea present.     Comments: Nasal mucosa is erythematous and edematous with scant clear discharge in both nares.    Mouth/Throat:     Mouth: Mucous membranes are moist.     Pharynx: Oropharynx is clear. Posterior oropharyngeal erythema present. No oropharyngeal exudate.     Comments: Mild erythema to the posterior pharynx without injection.  No appreciable exudate. Cardiovascular:     Rate and Rhythm: Normal rate and regular rhythm.     Pulses: Normal pulses.     Heart sounds: Normal heart sounds. No murmur heard.    No friction rub. No gallop.  Pulmonary:     Effort: Pulmonary effort is  normal.     Breath sounds: Wheezing and rhonchi present. No rales.     Comments: Patient has coarse expiratory lung sounds in the left upper lobe posteriorly.  Remainder of her lung sounds are clear. Musculoskeletal:     Cervical back: Normal range of motion and neck supple. No tenderness.  Lymphadenopathy:     Cervical: No cervical adenopathy.  Skin:    General: Skin is warm and dry.     Capillary Refill: Capillary refill takes less than 2 seconds.  Neurological:     General: No focal deficit present.     Mental Status: She is alert and oriented to person, place, and time.      UC Treatments / Results  Labs (all labs ordered are listed, but only abnormal results are displayed) Labs Reviewed - No data to display  EKG   Radiology DG Chest 2 View  Result Date: 05/24/2023 CLINICAL DATA:  Nonproductive cough with shortness of breath and wheezing x 2 weeks. No history of COPD or asthma. EXAM: CHEST - 2 VIEW COMPARISON:  Chest x-ray May 07, 2021. FINDINGS: The heart size and mediastinal contours are within normal limits. Right IJ approach Port-A-Cath with the tip projecting near the superior cavoatrial junction. No confluent consolidation similar mild streaky right basilar opacity. No visible pleural effusions or pneumothorax. No acute osseous abnormality. IMPRESSION: 1. No active cardiopulmonary disease. 2. Similar right basilar scarring and/or atelectasis. Electronically Signed   By: Feliberto Harts M.D.   On: 05/24/2023 14:58    Procedures Procedures (including critical care time)  Medications Ordered in UC Medications - No data to display  Initial Impression / Assessment and Plan / UC Course  I have reviewed the triage vital signs and the nursing notes.  Pertinent labs & imaging results that were available during my care of the patient were reviewed by me and considered in my medical decision making (see chart for details).   The patient is a nontoxic-appearing 82 year old  female who presents for evaluation of 2 weeks worth of nonproductive cough that is worse when she lays down with some mild associated runny nose, nasal congestion, shortness breath, and wheezing.  The patient is not in any respiratory distress in the exam room and she is able to speak in full sentence without dyspnea or tachypnea.  Respiratory rate in triage was 16 and her room air oxygen saturation is 100%.  Have some inflamed nasal mucosa as well as erythema to her posterior oropharynx but no significant nasal discharge or postnasal drip appreciated on exam.  No cervical lymphadenopathy present.  Cardiopulmonary exam does reveal coarse expiratory lung sounds in the left upper lobe.  I will obtain a chest x-ray to evaluate for any acute cardiopulmonary pathology.  Chest x-ray independently reviewed and evaluated by me.  Impression: There is a port overlying the right mid chest.  The right lower lobe is surgically absent and there are clips in place.  Remainder of the lung fields are well aerated and no evidence of effusion or infiltrate.  Radiology overread is pending. Radiology impression states no active cardiopulmonary disease.  Similar right basilar scarring and/or atelectasis.  I suspect that the patient's cough is being driven by postnasal drip.  I will discharge her home with Atrovent nasal spray that she can use 4 times a day to help with her runny nose and postnasal drip.  Also Jerilynn Som that she can use every 8 hours as needed for cough.  If her symptoms not improve, or new symptoms develop she can return for reevaluation or see her PCP.   Final Clinical Impressions(s) / UC Diagnoses   Final diagnoses:  Postnasal drip  Acute cough     Discharge Instructions      Your chest x-ray does not demonstrate any evidence of pneumonia.  Your physical exam does reveal inflamed nasal mucosa with clear nasal discharge and clear postnasal drip in the back of your throat.  I suspect that this  postnasal drip is what is triggering her cough, especially given that your cough is worse when you lay down at night.  Use the Atrovent nasal spray, 2 squirts up each nostril every 6 hours, as needed for runny nose and postnasal drip.  Use the Tessalon Perles every 8 hours during the day as needed for cough.  Take them with a small sip of water.  You may experience numbness to the base of your tongue or metallic taste in her mouth, this is normal.  If your symptoms or not improving, new symptoms develop, or your symptoms worsen to return for reevaluation or see your primary care provider.     ED Prescriptions     Medication Sig Dispense Auth. Provider   benzonatate (TESSALON) 100 MG capsule Take 2 capsules (200 mg total) by mouth every 8 (eight) hours. 21 capsule Becky Augusta, NP   ipratropium (ATROVENT) 0.06 % nasal spray Place 2 sprays into both nostrils 4 (four) times daily. 15 mL Becky Augusta, NP      PDMP not reviewed this encounter.   Becky Augusta, NP  05/24/23 1502  

## 2023-05-31 DIAGNOSIS — K1329 Other disturbances of oral epithelium, including tongue: Secondary | ICD-10-CM | POA: Diagnosis not present

## 2023-06-06 DIAGNOSIS — R519 Headache, unspecified: Secondary | ICD-10-CM | POA: Diagnosis not present

## 2023-06-06 DIAGNOSIS — Z8673 Personal history of transient ischemic attack (TIA), and cerebral infarction without residual deficits: Secondary | ICD-10-CM | POA: Diagnosis not present

## 2023-06-06 DIAGNOSIS — R296 Repeated falls: Secondary | ICD-10-CM | POA: Diagnosis not present

## 2023-06-07 DIAGNOSIS — J188 Other pneumonia, unspecified organism: Secondary | ICD-10-CM | POA: Diagnosis not present

## 2023-06-21 DIAGNOSIS — R918 Other nonspecific abnormal finding of lung field: Secondary | ICD-10-CM | POA: Diagnosis not present

## 2023-06-21 DIAGNOSIS — L309 Dermatitis, unspecified: Secondary | ICD-10-CM | POA: Diagnosis not present

## 2023-06-21 DIAGNOSIS — J188 Other pneumonia, unspecified organism: Secondary | ICD-10-CM | POA: Diagnosis not present

## 2023-06-21 DIAGNOSIS — R051 Acute cough: Secondary | ICD-10-CM | POA: Diagnosis not present

## 2023-06-21 DIAGNOSIS — J90 Pleural effusion, not elsewhere classified: Secondary | ICD-10-CM | POA: Diagnosis not present

## 2023-07-26 ENCOUNTER — Ambulatory Visit: Payer: Medicare HMO | Attending: Physical Therapy | Admitting: Physical Therapy

## 2023-07-28 DIAGNOSIS — F3341 Major depressive disorder, recurrent, in partial remission: Secondary | ICD-10-CM | POA: Diagnosis not present

## 2023-07-28 DIAGNOSIS — M25532 Pain in left wrist: Secondary | ICD-10-CM | POA: Diagnosis not present

## 2023-07-28 DIAGNOSIS — J90 Pleural effusion, not elsewhere classified: Secondary | ICD-10-CM | POA: Diagnosis not present

## 2023-07-28 DIAGNOSIS — E119 Type 2 diabetes mellitus without complications: Secondary | ICD-10-CM | POA: Diagnosis not present

## 2023-07-28 DIAGNOSIS — E782 Mixed hyperlipidemia: Secondary | ICD-10-CM | POA: Diagnosis not present

## 2023-07-28 DIAGNOSIS — I10 Essential (primary) hypertension: Secondary | ICD-10-CM | POA: Diagnosis not present

## 2023-07-28 DIAGNOSIS — M8580 Other specified disorders of bone density and structure, unspecified site: Secondary | ICD-10-CM | POA: Diagnosis not present

## 2023-07-28 DIAGNOSIS — R053 Chronic cough: Secondary | ICD-10-CM | POA: Diagnosis not present

## 2023-07-28 DIAGNOSIS — M19032 Primary osteoarthritis, left wrist: Secondary | ICD-10-CM | POA: Diagnosis not present

## 2023-07-28 DIAGNOSIS — J189 Pneumonia, unspecified organism: Secondary | ICD-10-CM | POA: Diagnosis not present

## 2023-08-07 DIAGNOSIS — L910 Hypertrophic scar: Secondary | ICD-10-CM | POA: Diagnosis not present

## 2023-08-19 ENCOUNTER — Emergency Department: Payer: Medicare HMO

## 2023-08-19 ENCOUNTER — Other Ambulatory Visit: Payer: Self-pay

## 2023-08-19 ENCOUNTER — Emergency Department
Admission: EM | Admit: 2023-08-19 | Discharge: 2023-08-19 | Disposition: A | Discharge status: Home / Self Care | Payer: Medicare HMO | Attending: Emergency Medicine | Admitting: Emergency Medicine

## 2023-08-19 ENCOUNTER — Ambulatory Visit
Admission: EM | Admit: 2023-08-19 | Discharge: 2023-08-19 | Disposition: A | Discharge status: Home / Self Care | Payer: Medicare HMO

## 2023-08-19 DIAGNOSIS — M79642 Pain in left hand: Secondary | ICD-10-CM | POA: Insufficient documentation

## 2023-08-19 DIAGNOSIS — R0789 Other chest pain: Secondary | ICD-10-CM | POA: Insufficient documentation

## 2023-08-19 DIAGNOSIS — G319 Degenerative disease of nervous system, unspecified: Secondary | ICD-10-CM | POA: Diagnosis not present

## 2023-08-19 DIAGNOSIS — S0990XA Unspecified injury of head, initial encounter: Secondary | ICD-10-CM | POA: Diagnosis not present

## 2023-08-19 DIAGNOSIS — W01198A Fall on same level from slipping, tripping and stumbling with subsequent striking against other object, initial encounter: Secondary | ICD-10-CM | POA: Diagnosis not present

## 2023-08-19 DIAGNOSIS — M7989 Other specified soft tissue disorders: Secondary | ICD-10-CM | POA: Diagnosis not present

## 2023-08-19 DIAGNOSIS — M19042 Primary osteoarthritis, left hand: Secondary | ICD-10-CM | POA: Diagnosis not present

## 2023-08-19 DIAGNOSIS — S0181XA Laceration without foreign body of other part of head, initial encounter: Secondary | ICD-10-CM

## 2023-08-19 DIAGNOSIS — R0781 Pleurodynia: Secondary | ICD-10-CM | POA: Diagnosis not present

## 2023-08-19 DIAGNOSIS — M4312 Spondylolisthesis, cervical region: Secondary | ICD-10-CM | POA: Diagnosis not present

## 2023-08-19 DIAGNOSIS — M4802 Spinal stenosis, cervical region: Secondary | ICD-10-CM | POA: Diagnosis not present

## 2023-08-19 MED ORDER — CEPHALEXIN 500 MG PO CAPS: 500.0000 mg | ORAL_CAPSULE | Freq: Three times a day (TID) | ORAL | 0 refills | Status: AC

## 2023-08-19 MED ORDER — IBUPROFEN 400 MG PO TABS
400.0000 mg | ORAL_TABLET | Freq: Once | ORAL | Status: AC
  Administered 2023-08-19: 400 mg via ORAL
  Filled 2023-08-19: qty 1

## 2023-08-19 MED ORDER — LIDOCAINE HCL (PF) 1 % IJ SOLN
5.0000 mL | Freq: Once | INTRAMUSCULAR | Status: AC
  Administered 2023-08-19: 5 mL
  Filled 2023-08-19: qty 5

## 2023-08-19 MED ORDER — BACITRACIN ZINC 500 UNIT/GM EX OINT: TOPICAL_OINTMENT | Freq: Two times a day (BID) | CUTANEOUS | Status: DC

## 2023-08-19 MED ORDER — DOUBLE ANTIBIOTIC 500-10000 UNIT/GM EX OINT
TOPICAL_OINTMENT | Freq: Two times a day (BID) | CUTANEOUS | Status: DC
  Filled 2023-08-19: qty 1

## 2023-08-19 MED ORDER — BACITRACIN-NEOMYCIN-POLYMYXIN OINTMENT TUBE
TOPICAL_OINTMENT | Freq: Once | CUTANEOUS | Status: AC
  Filled 2023-08-19: qty 14.17

## 2023-08-19 NOTE — ED Triage Notes (Signed)
Patient brought here with her husband.  Patient states that she fell last night and hit her face and forehead on the hardwood floor.  Patient states that it was an unwitnessed fall.  Patient unsure of LOC.  Patient has indentation on the left side of her forehead above her eyebrow and swelling and bruising in her left facial cheek.  Patient states that she is on a blood thinner.  Eusebio Friendly, PA was notified to assess patient.

## 2023-08-19 NOTE — ED Provider Notes (Signed)
[facility] Provider Note    Event Date/Time   First MD Initiated Contact with Patient 08/19/23 1502     (approximate)   History   Laceration and Fall   HPI  Kelly Krause is a 82 y.o. female who presents to the emergency department today because of concerns for a fall that she suffered yesterday.  Patient is unsure why she fell although has a history of falls.  She says that she is clumsy.  She denies any chest pain or lightheadedness or palpitations prior to the fall.  She fell and struck her head.  Denies loss of consciousness.  She says that she did not come in last night because of how busy the ER typically is.  Today she is complaining of some headache in the left chest pain.  She is also having some left hand pain.  She denies any shortness of breath.     Physical Exam   Triage Vital Signs: ED Triage Vitals  Encounter Vitals Group     BP 08/19/23 1445 (!) 168/70     Systolic BP Percentile --      Diastolic BP Percentile --      Pulse Rate 08/19/23 1445 67     Resp 08/19/23 1445 20     Temp 08/19/23 1445 98 F (36.7 C)     Temp src --      SpO2 08/19/23 1445 94 %     Weight 08/19/23 1448 165 lb (74.8 kg)     Height 08/19/23 1448 5\' 7"  (1.702 m)     Head Circumference --      Peak Flow --      Pain Score 08/19/23 1448 4     Pain Loc --      Pain Education --      Exclude from Growth Chart --     Most recent vital signs: Vitals:   08/19/23 1445 08/19/23 1456  BP: (!) 168/70 (!) 167/67  Pulse: 67 66  Resp: 20 14  Temp: 98 F (36.7 C)   SpO2: 94% 100%   General: Awake, alert, oriented. CV:  Good peripheral perfusion. Regular rate and rhythm. Resp:  Normal effort. Lungs clear. Abd:  No distention.  Other:  Stellate laceration to left forehead.   ED Results / Procedures / Treatments   Labs (all labs ordered are listed, but only abnormal results are displayed) Labs Reviewed - No data to  display   EKG  None   RADIOLOGY I independently interpreted and visualized the CT head/cervical spine. My interpretation: no bleed. No fracture Radiology interpretation: IMPRESSION: 1. No intracranial trauma. 2. Fluid in the frontal sinuses suggest sinusitis. Cannot exclude hemorrhage within the frontal sinuses. No fracture identified. Additional scattered opacification of ethmoid air cells. 3. Small laceration above the LEFT orbit. No evidence of orbital fracture. 4. Chronic RIGHT MCA territory infarctions. 5. No evidence cervical spine fracture. 6. Cervical spondylosis.   I independently interpreted and visualized the left rib series. My interpretation: no fracture Radiology interpretation:  IMPRESSION:  1. No evidence for an acute displaced left-sided rib fracture.  2. Right base atelectasis with small right pleural effusion, similar  to prior.    I independently interpreted and visualized the left hand. My interpretation: No fracture Radiology interpretation:  IMPRESSION:  1. No acute bony abnormality.  2. Degenerative changes in scattered IP joints.  3. Age indeterminate scapholunate dissociation. Correlation for  point tenderness in the wrist may prove helpful.  PROCEDURES:  Critical Care performed: No  MEDICATIONS ORDERED IN ED: Medications - No data to display   IMPRESSION / MDM / ASSESSMENT AND PLAN / ED COURSE  I reviewed the triage vital signs and the nursing notes.                              Differential diagnosis includes, but is not limited to, ICH, fracture  Patient's presentation is most consistent with acute presentation with potential threat to life or bodily function.   Patient presents to the emergency department today because of concern for injuries sustained after a fall yesterday evening. On exam patient with laceration to left forehead. Will obtain CT scans of the head and neck, x-ray of left ribs and left hand. Discussed obtaining  blood work/EKG to evaluate for causes of fall but patient deferred at this time. States she falls frequently and is clumsy.  Imaging without any acute fractures. There was questions of wrist injury on x-ray however patient not particularly tender to that area. Laceration was sutured closed. Discussed infection return precautions.       FINAL CLINICAL IMPRESSION(S) / ED DIAGNOSES   Final diagnoses:  Facial laceration, initial encounter  Injury of head, initial encounter    Note:  This document was prepared using Dragon voice recognition software and may include unintentional dictation errors.    Phineas Semen, MD 08/19/23 [phone]

## 2023-08-19 NOTE — ED Notes (Signed)
Lidocaine used by provider for laceration repair.

## 2023-08-19 NOTE — ED Notes (Signed)
Patient is being discharged from the Urgent Care and sent to the [facility] Emergency Department via private vehicle with spouse . Per Eusebio Friendly, PA, patient is in need of higher level of care due to severe facial and head injury. Patient is aware and verbalizes understanding of plan of care.  Vitals:   08/19/23 1407  BP: (!) 145/57  Pulse: 67  Resp: 14  Temp: 97.8 F (36.6 C)  SpO2: 98%

## 2023-08-19 NOTE — ED Notes (Signed)
Laceration cart placed by room door

## 2023-08-19 NOTE — ED Provider Notes (Signed)
82 year old female with a past medical history significant for esophageal cancer, atrial fibrillation, colon cancer, dysrhythmia, GERD, hypertension, and lung cancer presents with husband for head injury following accidental fall yesterday.  Of note, patient is on Eliquis.  Patient brought in in a wheelchair by her husband.  She has an indention and jagged laceration of the left brow/orbit.  Appears to be swelling around the orbit and bruising.  Vitals are all stable.  She is conscious, alert and oriented x 3.  Advised patient and husband that she needs to be evaluated in the emergency department for CT scan given her injury and concern for skull fracture and intracranial bleeding especially since she is on an anticoagulant.  Advised EMS transport but they declined.  He states he will take her to [facility].  She is leaving in stable condition and understands the risks of going by personal vehicle.   Shirlee Latch, PA-C 08/19/23 1419

## 2023-08-19 NOTE — Discharge Instructions (Signed)
15 sutures were placed today. They will need to be removed in 7-10 days.

## 2023-08-19 NOTE — ED Triage Notes (Signed)
Pt presents with laceration to left forehead following a fall; has periorbital bruising to left eye. Pt is on Eliquis. Denies LOC. Is alert and oriented; in NAD during triage.

## 2023-08-19 NOTE — Discharge Instructions (Signed)

## 2023-08-31 DIAGNOSIS — W010XXD Fall on same level from slipping, tripping and stumbling without subsequent striking against object, subsequent encounter: Secondary | ICD-10-CM | POA: Diagnosis not present

## 2023-08-31 DIAGNOSIS — S0181XD Laceration without foreign body of other part of head, subsequent encounter: Secondary | ICD-10-CM | POA: Diagnosis not present

## 2023-08-31 DIAGNOSIS — F32A Depression, unspecified: Secondary | ICD-10-CM | POA: Diagnosis not present

## 2023-08-31 DIAGNOSIS — I10 Essential (primary) hypertension: Secondary | ICD-10-CM | POA: Diagnosis not present

## 2023-08-31 DIAGNOSIS — Y92009 Unspecified place in unspecified non-institutional (private) residence as the place of occurrence of the external cause: Secondary | ICD-10-CM | POA: Diagnosis not present

## 2023-08-31 DIAGNOSIS — I480 Paroxysmal atrial fibrillation: Secondary | ICD-10-CM | POA: Diagnosis not present

## 2023-09-04 DIAGNOSIS — L570 Actinic keratosis: Secondary | ICD-10-CM | POA: Diagnosis not present

## 2023-09-04 DIAGNOSIS — C44719 Basal cell carcinoma of skin of left lower limb, including hip: Secondary | ICD-10-CM | POA: Diagnosis not present

## 2023-09-04 DIAGNOSIS — D0439 Carcinoma in situ of skin of other parts of face: Secondary | ICD-10-CM | POA: Diagnosis not present

## 2023-09-04 DIAGNOSIS — D485 Neoplasm of uncertain behavior of skin: Secondary | ICD-10-CM | POA: Diagnosis not present

## 2023-09-06 DIAGNOSIS — F411 Generalized anxiety disorder: Secondary | ICD-10-CM | POA: Diagnosis not present

## 2023-09-06 DIAGNOSIS — R296 Repeated falls: Secondary | ICD-10-CM | POA: Diagnosis not present

## 2023-09-06 DIAGNOSIS — Z8673 Personal history of transient ischemic attack (TIA), and cerebral infarction without residual deficits: Secondary | ICD-10-CM | POA: Diagnosis not present

## 2023-09-08 ENCOUNTER — Other Ambulatory Visit: Payer: Self-pay | Admitting: Student

## 2023-09-08 DIAGNOSIS — R2689 Other abnormalities of gait and mobility: Secondary | ICD-10-CM

## 2023-09-08 DIAGNOSIS — Z87828 Personal history of other (healed) physical injury and trauma: Secondary | ICD-10-CM

## 2023-09-08 DIAGNOSIS — R296 Repeated falls: Secondary | ICD-10-CM

## 2023-09-08 DIAGNOSIS — Z8673 Personal history of transient ischemic attack (TIA), and cerebral infarction without residual deficits: Secondary | ICD-10-CM

## 2023-09-18 DIAGNOSIS — I471 Supraventricular tachycardia, unspecified: Secondary | ICD-10-CM | POA: Diagnosis not present

## 2023-09-18 DIAGNOSIS — R55 Syncope and collapse: Secondary | ICD-10-CM | POA: Diagnosis not present

## 2023-09-18 DIAGNOSIS — K219 Gastro-esophageal reflux disease without esophagitis: Secondary | ICD-10-CM | POA: Diagnosis not present

## 2023-09-18 DIAGNOSIS — E782 Mixed hyperlipidemia: Secondary | ICD-10-CM | POA: Diagnosis not present

## 2023-09-18 DIAGNOSIS — C159 Malignant neoplasm of esophagus, unspecified: Secondary | ICD-10-CM | POA: Diagnosis not present

## 2023-09-18 DIAGNOSIS — I10 Essential (primary) hypertension: Secondary | ICD-10-CM | POA: Diagnosis not present

## 2023-09-18 DIAGNOSIS — E119 Type 2 diabetes mellitus without complications: Secondary | ICD-10-CM | POA: Diagnosis not present

## 2023-09-18 DIAGNOSIS — I480 Paroxysmal atrial fibrillation: Secondary | ICD-10-CM | POA: Diagnosis not present

## 2023-09-25 DIAGNOSIS — R55 Syncope and collapse: Secondary | ICD-10-CM | POA: Diagnosis not present

## 2023-10-02 DIAGNOSIS — I480 Paroxysmal atrial fibrillation: Secondary | ICD-10-CM | POA: Diagnosis not present

## 2023-10-02 DIAGNOSIS — R55 Syncope and collapse: Secondary | ICD-10-CM | POA: Diagnosis not present

## 2023-10-02 DIAGNOSIS — I471 Supraventricular tachycardia, unspecified: Secondary | ICD-10-CM | POA: Diagnosis not present

## 2023-10-03 ENCOUNTER — Encounter: Payer: Self-pay | Admitting: Student

## 2023-10-05 ENCOUNTER — Ambulatory Visit
Admission: RE | Admit: 2023-10-05 | Discharge: 2023-10-05 | Disposition: A | Discharge status: Home / Self Care | Payer: Medicare HMO | Source: Ambulatory Visit | Attending: Student | Admitting: Student

## 2023-10-05 DIAGNOSIS — Z87828 Personal history of other (healed) physical injury and trauma: Secondary | ICD-10-CM

## 2023-10-05 DIAGNOSIS — R2689 Other abnormalities of gait and mobility: Secondary | ICD-10-CM

## 2023-10-05 DIAGNOSIS — R296 Repeated falls: Secondary | ICD-10-CM | POA: Diagnosis not present

## 2023-10-05 DIAGNOSIS — Z8673 Personal history of transient ischemic attack (TIA), and cerebral infarction without residual deficits: Secondary | ICD-10-CM

## 2023-10-05 MED ORDER — GADOPICLENOL 0.5 MMOL/ML IV SOLN
7.5000 mL | Freq: Once | INTRAVENOUS | Status: AC | PRN
  Administered 2023-10-05: 7.5 mL via INTRAVENOUS

## 2023-11-03 ENCOUNTER — Other Ambulatory Visit: Payer: Self-pay | Admitting: Family Medicine

## 2023-11-03 DIAGNOSIS — Z1231 Encounter for screening mammogram for malignant neoplasm of breast: Secondary | ICD-10-CM

## 2023-11-28 ENCOUNTER — Ambulatory Visit
Admission: RE | Admit: 2023-11-28 | Discharge: 2023-11-28 | Disposition: A | Discharge status: Home / Self Care | Payer: Medicare HMO | Source: Ambulatory Visit | Attending: Family Medicine | Admitting: Family Medicine

## 2023-11-28 DIAGNOSIS — Z1231 Encounter for screening mammogram for malignant neoplasm of breast: Secondary | ICD-10-CM | POA: Insufficient documentation

## 2024-01-08 DIAGNOSIS — H26491 Other secondary cataract, right eye: Secondary | ICD-10-CM | POA: Diagnosis not present

## 2024-01-10 DIAGNOSIS — H26491 Other secondary cataract, right eye: Secondary | ICD-10-CM | POA: Diagnosis not present

## 2024-01-22 DIAGNOSIS — D0439 Carcinoma in situ of skin of other parts of face: Secondary | ICD-10-CM | POA: Diagnosis not present

## 2024-01-22 DIAGNOSIS — C44719 Basal cell carcinoma of skin of left lower limb, including hip: Secondary | ICD-10-CM | POA: Diagnosis not present

## 2024-02-05 DIAGNOSIS — R1310 Dysphagia, unspecified: Secondary | ICD-10-CM | POA: Diagnosis not present

## 2024-02-05 DIAGNOSIS — K279 Peptic ulcer, site unspecified, unspecified as acute or chronic, without hemorrhage or perforation: Secondary | ICD-10-CM | POA: Diagnosis not present

## 2024-02-05 DIAGNOSIS — Z859 Personal history of malignant neoplasm, unspecified: Secondary | ICD-10-CM | POA: Diagnosis not present

## 2024-02-05 DIAGNOSIS — K222 Esophageal obstruction: Secondary | ICD-10-CM | POA: Diagnosis not present

## 2024-02-05 DIAGNOSIS — K219 Gastro-esophageal reflux disease without esophagitis: Secondary | ICD-10-CM | POA: Diagnosis not present

## 2024-02-05 DIAGNOSIS — Z79899 Other long term (current) drug therapy: Secondary | ICD-10-CM | POA: Diagnosis not present

## 2024-02-05 DIAGNOSIS — I10 Essential (primary) hypertension: Secondary | ICD-10-CM | POA: Diagnosis not present

## 2024-02-05 DIAGNOSIS — Z8501 Personal history of malignant neoplasm of esophagus: Secondary | ICD-10-CM | POA: Diagnosis not present

## 2024-02-05 DIAGNOSIS — Z885 Allergy status to narcotic agent status: Secondary | ICD-10-CM | POA: Diagnosis not present

## 2024-02-05 DIAGNOSIS — Z87891 Personal history of nicotine dependence: Secondary | ICD-10-CM | POA: Diagnosis not present

## 2024-02-06 DIAGNOSIS — H26492 Other secondary cataract, left eye: Secondary | ICD-10-CM | POA: Diagnosis not present

## 2024-02-15 DIAGNOSIS — Z8249 Family history of ischemic heart disease and other diseases of the circulatory system: Secondary | ICD-10-CM | POA: Diagnosis not present

## 2024-02-15 DIAGNOSIS — Z809 Family history of malignant neoplasm, unspecified: Secondary | ICD-10-CM | POA: Diagnosis not present

## 2024-02-15 DIAGNOSIS — I110 Hypertensive heart disease with heart failure: Secondary | ICD-10-CM | POA: Diagnosis not present

## 2024-02-15 DIAGNOSIS — D6869 Other thrombophilia: Secondary | ICD-10-CM | POA: Diagnosis not present

## 2024-02-15 DIAGNOSIS — K5900 Constipation, unspecified: Secondary | ICD-10-CM | POA: Diagnosis not present

## 2024-02-15 DIAGNOSIS — I509 Heart failure, unspecified: Secondary | ICD-10-CM | POA: Diagnosis not present

## 2024-02-15 DIAGNOSIS — Z85828 Personal history of other malignant neoplasm of skin: Secondary | ICD-10-CM | POA: Diagnosis not present

## 2024-02-15 DIAGNOSIS — F325 Major depressive disorder, single episode, in full remission: Secondary | ICD-10-CM | POA: Diagnosis not present

## 2024-02-15 DIAGNOSIS — E785 Hyperlipidemia, unspecified: Secondary | ICD-10-CM | POA: Diagnosis not present

## 2024-02-15 DIAGNOSIS — I4891 Unspecified atrial fibrillation: Secondary | ICD-10-CM | POA: Diagnosis not present

## 2024-02-15 DIAGNOSIS — G319 Degenerative disease of nervous system, unspecified: Secondary | ICD-10-CM | POA: Diagnosis not present

## 2024-02-15 DIAGNOSIS — M1990 Unspecified osteoarthritis, unspecified site: Secondary | ICD-10-CM | POA: Diagnosis not present

## 2024-02-15 DIAGNOSIS — Z87891 Personal history of nicotine dependence: Secondary | ICD-10-CM | POA: Diagnosis not present

## 2024-04-01 DIAGNOSIS — E782 Mixed hyperlipidemia: Secondary | ICD-10-CM | POA: Diagnosis not present

## 2024-04-01 DIAGNOSIS — I480 Paroxysmal atrial fibrillation: Secondary | ICD-10-CM | POA: Diagnosis not present

## 2024-04-01 DIAGNOSIS — F32A Depression, unspecified: Secondary | ICD-10-CM | POA: Diagnosis not present

## 2024-04-01 DIAGNOSIS — K219 Gastro-esophageal reflux disease without esophagitis: Secondary | ICD-10-CM | POA: Diagnosis not present

## 2024-04-01 DIAGNOSIS — E119 Type 2 diabetes mellitus without complications: Secondary | ICD-10-CM | POA: Diagnosis not present

## 2024-04-01 DIAGNOSIS — I10 Essential (primary) hypertension: Secondary | ICD-10-CM | POA: Diagnosis not present

## 2024-04-09 DIAGNOSIS — R399 Unspecified symptoms and signs involving the genitourinary system: Secondary | ICD-10-CM | POA: Diagnosis not present

## 2024-04-10 DIAGNOSIS — R399 Unspecified symptoms and signs involving the genitourinary system: Secondary | ICD-10-CM | POA: Diagnosis not present

## 2024-04-24 DIAGNOSIS — Z85828 Personal history of other malignant neoplasm of skin: Secondary | ICD-10-CM | POA: Diagnosis not present

## 2024-04-24 DIAGNOSIS — L821 Other seborrheic keratosis: Secondary | ICD-10-CM | POA: Diagnosis not present

## 2024-04-24 DIAGNOSIS — M1612 Unilateral primary osteoarthritis, left hip: Secondary | ICD-10-CM | POA: Diagnosis not present

## 2024-04-24 DIAGNOSIS — M25552 Pain in left hip: Secondary | ICD-10-CM | POA: Diagnosis not present

## 2024-04-24 DIAGNOSIS — L570 Actinic keratosis: Secondary | ICD-10-CM | POA: Diagnosis not present

## 2024-04-24 DIAGNOSIS — L578 Other skin changes due to chronic exposure to nonionizing radiation: Secondary | ICD-10-CM | POA: Diagnosis not present

## 2024-04-24 DIAGNOSIS — Z872 Personal history of diseases of the skin and subcutaneous tissue: Secondary | ICD-10-CM | POA: Diagnosis not present

## 2024-04-24 DIAGNOSIS — Z859 Personal history of malignant neoplasm, unspecified: Secondary | ICD-10-CM | POA: Diagnosis not present

## 2024-05-17 DIAGNOSIS — M1612 Unilateral primary osteoarthritis, left hip: Secondary | ICD-10-CM | POA: Diagnosis not present

## 2024-05-17 DIAGNOSIS — M8588 Other specified disorders of bone density and structure, other site: Secondary | ICD-10-CM | POA: Diagnosis not present

## 2024-05-17 DIAGNOSIS — M542 Cervicalgia: Secondary | ICD-10-CM | POA: Diagnosis not present

## 2024-06-12 DIAGNOSIS — Z85038 Personal history of other malignant neoplasm of large intestine: Secondary | ICD-10-CM | POA: Diagnosis not present

## 2024-06-12 DIAGNOSIS — Z7901 Long term (current) use of anticoagulants: Secondary | ICD-10-CM | POA: Diagnosis not present

## 2024-06-12 DIAGNOSIS — K219 Gastro-esophageal reflux disease without esophagitis: Secondary | ICD-10-CM | POA: Diagnosis not present

## 2024-06-28 ENCOUNTER — Encounter
Admission: RE | Disposition: A | Discharge status: Home / Self Care | Payer: Self-pay | Source: Home / Self Care | Attending: Gastroenterology

## 2024-06-28 ENCOUNTER — Ambulatory Visit: Admitting: Anesthesiology

## 2024-06-28 ENCOUNTER — Ambulatory Visit
Admission: RE | Admit: 2024-06-28 | Discharge: 2024-06-28 | Disposition: A | Discharge status: Home / Self Care | Attending: Gastroenterology | Admitting: Gastroenterology

## 2024-06-28 ENCOUNTER — Encounter: Payer: Self-pay | Admitting: Gastroenterology

## 2024-06-28 DIAGNOSIS — K5730 Diverticulosis of large intestine without perforation or abscess without bleeding: Secondary | ICD-10-CM | POA: Insufficient documentation

## 2024-06-28 DIAGNOSIS — Z8711 Personal history of peptic ulcer disease: Secondary | ICD-10-CM | POA: Insufficient documentation

## 2024-06-28 DIAGNOSIS — I4891 Unspecified atrial fibrillation: Secondary | ICD-10-CM | POA: Diagnosis not present

## 2024-06-28 DIAGNOSIS — Z1211 Encounter for screening for malignant neoplasm of colon: Secondary | ICD-10-CM | POA: Diagnosis not present

## 2024-06-28 DIAGNOSIS — F32A Depression, unspecified: Secondary | ICD-10-CM | POA: Diagnosis not present

## 2024-06-28 DIAGNOSIS — Z8673 Personal history of transient ischemic attack (TIA), and cerebral infarction without residual deficits: Secondary | ICD-10-CM | POA: Insufficient documentation

## 2024-06-28 DIAGNOSIS — K621 Rectal polyp: Secondary | ICD-10-CM | POA: Insufficient documentation

## 2024-06-28 DIAGNOSIS — K9189 Other postprocedural complications and disorders of digestive system: Secondary | ICD-10-CM | POA: Diagnosis not present

## 2024-06-28 DIAGNOSIS — I471 Supraventricular tachycardia, unspecified: Secondary | ICD-10-CM | POA: Insufficient documentation

## 2024-06-28 DIAGNOSIS — Z980 Intestinal bypass and anastomosis status: Secondary | ICD-10-CM | POA: Insufficient documentation

## 2024-06-28 DIAGNOSIS — I252 Old myocardial infarction: Secondary | ICD-10-CM | POA: Insufficient documentation

## 2024-06-28 DIAGNOSIS — Z85038 Personal history of other malignant neoplasm of large intestine: Secondary | ICD-10-CM | POA: Insufficient documentation

## 2024-06-28 DIAGNOSIS — Z87891 Personal history of nicotine dependence: Secondary | ICD-10-CM | POA: Insufficient documentation

## 2024-06-28 DIAGNOSIS — K5140 Inflammatory polyps of colon without complications: Secondary | ICD-10-CM | POA: Insufficient documentation

## 2024-06-28 DIAGNOSIS — K6389 Other specified diseases of intestine: Secondary | ICD-10-CM | POA: Diagnosis not present

## 2024-06-28 DIAGNOSIS — K219 Gastro-esophageal reflux disease without esophagitis: Secondary | ICD-10-CM | POA: Diagnosis not present

## 2024-06-28 DIAGNOSIS — Z79899 Other long term (current) drug therapy: Secondary | ICD-10-CM | POA: Diagnosis not present

## 2024-06-28 DIAGNOSIS — K635 Polyp of colon: Secondary | ICD-10-CM | POA: Diagnosis not present

## 2024-06-28 DIAGNOSIS — K649 Unspecified hemorrhoids: Secondary | ICD-10-CM | POA: Diagnosis not present

## 2024-06-28 DIAGNOSIS — I10 Essential (primary) hypertension: Secondary | ICD-10-CM | POA: Diagnosis not present

## 2024-06-28 HISTORY — PX: POLYPECTOMY: SHX149

## 2024-06-28 HISTORY — PX: COLONOSCOPY: SHX5424

## 2024-06-28 SURGERY — COLONOSCOPY
Anesthesia: General

## 2024-06-28 MED ORDER — PROPOFOL 10 MG/ML IV BOLUS
INTRAVENOUS | Status: DC | PRN
  Administered 2024-06-28 (×2): 40 mg via INTRAVENOUS

## 2024-06-28 MED ORDER — PROPOFOL 500 MG/50ML IV EMUL
INTRAVENOUS | Status: DC | PRN
Start: 2024-06-28 — End: 2024-06-28
  Administered 2024-06-28: 50 ug/kg/min via INTRAVENOUS

## 2024-06-28 MED ORDER — SODIUM CHLORIDE 0.9 % IV SOLN
INTRAVENOUS | Status: DC
  Administered 2024-06-28: 500 mL via INTRAVENOUS

## 2024-06-28 MED ORDER — LIDOCAINE HCL (CARDIAC) PF 100 MG/5ML IV SOSY
PREFILLED_SYRINGE | INTRAVENOUS | Status: DC | PRN
Start: 2024-06-28 — End: 2024-06-28
  Administered 2024-06-28: 60 mg via INTRAVENOUS

## 2024-06-28 MED ORDER — LIDOCAINE HCL (PF) 2 % IJ SOLN
INTRAMUSCULAR | Status: AC
  Filled 2024-06-28: qty 10

## 2024-06-28 NOTE — Interval H&P Note (Signed)
 History and Physical Interval Note: Preprocedure H&P from 06/28/24  was reviewed and there was no interval change after seeing and examining the patient.  Written consent was obtained from the patient after discussion of risks, benefits, and alternatives. Patient has consented to proceed with Colonoscopy with possible intervention   06/28/2024 10:12 AM  Kelly Krause  has presented today for surgery, with the diagnosis of HX OF COLON.  The various methods of treatment have been discussed with the patient and family. After consideration of risks, benefits and other options for treatment, the patient has consented to  Procedure(s): COLONOSCOPY (N/A) as a surgical intervention.  The patient's history has been reviewed, patient examined, no change in status, stable for surgery.  I have reviewed the patient's chart and labs.  Questions were answered to the patient's satisfaction.     [name]

## 2024-06-28 NOTE — Op Note (Signed)
 [facility] Gastroenterology Patient Name: Kelly Krause Procedure Date: 06/28/2024 10:00 AM MRN: [id number] Account #: [id number] Date of Birth: 1941-06-12 Admit Type: Outpatient Age: 83 Room: [facility] ENDO ROOM 1 Gender: Female Note Status: Finalized Instrument Name: Peds Colonoscope [id number] Procedure:             Colonoscopy Indications:           High risk colon cancer surveillance: Personal history                         of colon cancer Providers:             Elspeth Ozell Onita ROSALEA, DO Referring MD:          Charlie CROME. Bertrum, MD (Referring MD) Medicines:             Monitored Anesthesia Care Complications:         No immediate complications. Estimated blood loss:                         Minimal. Procedure:             Pre-Anesthesia Assessment:                        - Prior to the procedure, a History and Physical was                         performed, and patient medications and allergies were                         reviewed. The patient is competent. The risks and                         benefits of the procedure and the sedation options and                         risks were discussed with the patient. All questions                         were answered and informed consent was obtained.                         Patient identification and proposed procedure were                         verified by the physician, the nurse, the anesthetist                         and the technician in the endoscopy suite. Mental                         Status Examination: alert and oriented. Airway                         Examination: normal oropharyngeal airway and neck                         mobility. Respiratory Examination: clear to  auscultation. CV Examination: RRR, no murmurs, no S3                         or S4. Prophylactic Antibiotics: The patient does not                         require prophylactic antibiotics. Prior                          Anticoagulants: The patient has taken Eliquis                          (apixaban ), last dose was 5 days prior to procedure.                         ASA Grade Assessment: III - A patient with severe                         systemic disease. After reviewing the risks and                         benefits, the patient was deemed in satisfactory                         condition to undergo the procedure. The anesthesia                         plan was to use monitored anesthesia care (MAC).                         Immediately prior to administration of medications,                         the patient was re-assessed for adequacy to receive                         sedatives. The heart rate, respiratory rate, oxygen                         saturations, blood pressure, adequacy of pulmonary                         ventilation, and response to care were monitored                         throughout the procedure. The physical status of the                         patient was re-assessed after the procedure.                        After obtaining informed consent, the colonoscope was                         passed under direct vision. Throughout the procedure,                         the patient's blood pressure, pulse, and oxygen  saturations were monitored continuously. The                         Colonoscope was introduced through the anus and                         advanced to the the ileocolonic anastomosis. The                         colonoscopy was performed without difficulty. The                         patient tolerated the procedure well. The quality of                         the bowel preparation was excellent. The rectum,                         neoterminal ileum/iliocolonic anastamosis were                         photographed. Findings:      The perianal and digital rectal examinations were normal. Pertinent       negatives include normal sphincter tone.       The neo-terminal ileum appeared normal. Estimated blood loss: none.      Two sessile polyps were found in the descending colon and transverse       colon. The polyps were 3 to 4 mm in size. These polyps were removed with       a cold snare. Resection and retrieval were complete. Estimated blood       loss was minimal.      Two sessile polyps were found in the rectum. The polyps were 1 mm in       size. These polyps were removed with a jumbo cold forceps. Resection and       retrieval were complete. Estimated blood loss was minimal.      Scattered small-mouthed diverticula were found in the left colon.       Estimated blood loss: none.      There was evidence of a prior end-to-side colo-colonic anastomosis in       the sigmoid colon. This was patent and was characterized by healthy       appearing mucosa. The anastomosis was traversed.      There was evidence of a prior end-to-side ileo-colonic anastomosis in       the transverse colon. This was patent and was characterized by healthy       appearing mucosa. The anastomosis was traversed. Estimated blood loss:       none.      The exam was otherwise without abnormality on direct and retroflexion       views. Impression:            - The examined portion of the ileum was normal.                        - Two 3 to 4 mm polyps in the descending colon and in                         the transverse colon, removed with a cold snare.  Resected and retrieved.                        - Two 1 mm polyps in the rectum, removed with a jumbo                         cold forceps. Resected and retrieved.                        - Diverticulosis in the left colon.                        - Patent end-to-side colo-colonic anastomosis,                         characterized by healthy appearing mucosa.                        - Patent end-to-side ileo-colonic anastomosis,                         characterized by healthy appearing  mucosa.                        - The examination was otherwise normal on direct and                         retroflexion views. Recommendation:        - Patient has a contact number available for                         emergencies. The signs and symptoms of potential                         delayed complications were discussed with the patient.                         Return to normal activities tomorrow. Written                         discharge instructions were provided to the patient.                        - Discharge patient to home.                        - Resume previous diet.                        - Continue present medications.                        - No ibuprofen , naproxen, or other non-steroidal                         anti-inflammatory drugs for 5 days after polyp removal.                        - Resume Eliquis  (apixaban ) at prior dose in 2 days.  Refer to managing physician for further adjustment of                         therapy.                        - Await pathology results.                        - No further surveillance colonoscopies given                         comorbidities and age.                        - Return to referring physician as previously                         scheduled.                        - The findings and recommendations were discussed with                         the patient. Procedure Code(s):     --- Professional ---                        [phone], Colonoscopy, flexible; with removal of                         tumor(s), polyp(s), or other lesion(s) by snare                         technique                        45380, 59, Colonoscopy, flexible; with biopsy, single                         or multiple Diagnosis Code(s):     --- Professional ---                        S14.961, Personal history of other malignant neoplasm                         of large intestine                        D12.4, Benign neoplasm of  descending colon                        D12.3, Benign neoplasm of transverse colon (hepatic                         flexure or splenic flexure)                        D12.8, Benign neoplasm of rectum                        Z98.0, Intestinal bypass and anastomosis status  K57.30, Diverticulosis of large intestine without                         perforation or abscess without bleeding CPT copyright 2022 [employer]. All rights reserved. The codes documented in this report are preliminary and upon coder review may  be revised to meet current compliance requirements. Attending Participation:      I personally performed the entire procedure. Elspeth Jungling, DO [name] DO, DO 06/28/2024 10:44:12 AM This report has been signed electronically. Number of Addenda: 0 Note Initiated On: 06/28/2024 10:00 AM Scope Withdrawal Time: 0 hours 15 minutes 16 seconds  Total Procedure Duration: 0 hours 20 minutes 26 seconds  Estimated Blood Loss:  Estimated blood loss was minimal.      [facility]

## 2024-06-28 NOTE — Transfer of Care (Signed)
 Immediate Anesthesia Transfer of Care Note  Patient: Kelly Krause  Procedure(s) Performed: COLONOSCOPY POLYPECTOMY, INTESTINE  Patient Location: PACU  Anesthesia Type:General  Level of Consciousness: sedated  Airway & Oxygen Therapy: Patient Spontanous Breathing  Post-op Assessment: Report given to RN and Post -op Vital signs reviewed and stable  Post vital signs: Reviewed and stable  Last Vitals:  Vitals Value Taken Time  BP 126/66 06/28/24 10:40  Temp    Pulse 71 06/28/24 10:41  Resp 11 06/28/24 10:41  SpO2 100 % 06/28/24 10:41  Vitals shown include unfiled device data.  Last Pain:  Vitals:   06/28/24 0947  TempSrc: Temporal  PainSc: 0-No pain         Complications: No notable events documented.

## 2024-06-28 NOTE — Anesthesia Preprocedure Evaluation (Signed)
 Anesthesia Evaluation  Patient identified by MRN, date of birth, ID band Patient awake    Reviewed: Allergy & Precautions, NPO status , Patient's Chart, lab work & pertinent test results  History of Anesthesia Complications Negative for: history of anesthetic complications  Airway Mallampati: II       Dental  (+) Teeth Intact, Dental Advidsory Given   Pulmonary neg pulmonary ROS, former smoker   breath sounds clear to auscultation       Cardiovascular Exercise Tolerance: Good hypertension, Pt. on medications (-) angina + Past MI  (-) Cardiac Stents and (-) CABG + dysrhythmias Atrial Fibrillation and Supra Ventricular Tachycardia (-) Valvular Problems/Murmurs Rhythm:Regular     Neuro/Psych  Headaches, neg Seizures PSYCHIATRIC DISORDERS  Depression    CVA, No Residual Symptoms    GI/Hepatic Neg liver ROS, PUD,GERD  Medicated,,  Endo/Other  negative endocrine ROS    Renal/GU negative Renal ROS     Musculoskeletal   Abdominal Normal abdominal exam  (+)   Peds  Hematology negative hematology ROS (+)   Anesthesia Other Findings Past Medical History: No date: Allergy No date: Atrial fibrillation (HCC) No date: Cancer [facility])     Comment:  esophagus/chemo and rad No date: Colon cancer (HCC)     Comment:  chemo/rad 15 yrs No date: Colon cancer (HCC)     Comment:  2 yrs ago No date: DDD (degenerative disc disease), cervical No date: Dysrhythmia No date: Elevated lipids No date: GERD (gastroesophageal reflux disease) No date: Hyperlipidemia No date: Hypertension No date: Lung cancer (HCC)     Comment:  chemo No date: Lung cancer (HCC) No date: Major depressive disorder, recurrent episode, moderate (HCC) No date: Persistent disorder of initiating or maintaining sleep No date: Personal history of chemotherapy No date: Personal history of radiation therapy     Comment:  colon 06/03/2021: Stroke (HCC)     Comment:  No  Deficits No date: SVT (supraventricular tachycardia) (HCC)   Reproductive/Obstetrics                              Anesthesia Physical Anesthesia Plan  ASA: 3  Anesthesia Plan: General   Post-op Pain Management:    Induction: Intravenous  PONV Risk Score and Plan: 3 and Propofol  infusion, TIVA and Treatment may vary due to age or medical condition  Airway Management Planned: Natural Airway and Nasal Cannula  Additional Equipment:   Intra-op Plan:   Post-operative Plan:   Informed Consent: I have reviewed the patients History and Physical, chart, labs and discussed the procedure including the risks, benefits and alternatives for the proposed anesthesia with the patient or authorized representative who has indicated his/her understanding and acceptance.       Plan Discussed with: Surgeon  Anesthesia Plan Comments:          Anesthesia Quick Evaluation

## 2024-06-28 NOTE — H&P (Signed)
 Pre-Procedure H&P   Patient ID: Kelly Krause is a 83 y.o. female.  Gastroenterology Provider: [name], DO  Referring Provider: Delmar Gails, NP PCP: Bertrum Charlie CROME, MD  Date: 06/28/2024  HPI Ms. Kelly Krause is a 83 y.o. female who presents today for Colonoscopy for Personal history of colon cancer .  Patient with history of colon cancer x 2.   Late 90s- underwent surgical resection along with systemic chemotherapy. Metastases to the lung s/p R lobe resection as well  She then had a tumor in the cecum in 2018 and underwent surgical resection.  Esophageal cancer s/p systemic chemotherapy and radiation (following with [facility]).  Eliquis  has been held for the procedure (5-6 days per patient).  Cerebrovascular accident last year  Deals with constipation on a regular basis   Past Medical History:  Diagnosis Date   Allergy    Atrial fibrillation (HCC)    Cancer (HCC)    esophagus/chemo and rad   Colon cancer (HCC)    chemo/rad 15 yrs   Colon cancer (HCC)    2 yrs ago   DDD (degenerative disc disease), cervical    Dysrhythmia    Elevated lipids    GERD (gastroesophageal reflux disease)    Hyperlipidemia    Hypertension    Lung cancer (HCC)    chemo   Lung cancer (HCC)    Major depressive disorder, recurrent episode, moderate (HCC)    Persistent disorder of initiating or maintaining sleep    Personal history of chemotherapy    Personal history of radiation therapy    colon   Stroke (HCC) 06/03/2021   No Deficits   SVT (supraventricular tachycardia) (HCC)     Past Surgical History:  Procedure Laterality Date   ABDOMINAL HYSTERECTOMY     ATRIAL FIBRILLATION ABLATION     BROW LIFT Bilateral 01/07/2022   Procedure: BLEPHAROPLASTY UPPER EYELID; W/ EXCESS SKIN BILATERAL;  Surgeon: Ashley Greig HERO, MD;  Location: [facility];  Service: Ophthalmology;  Laterality: Bilateral;   BUNIONECTOMY     CAPSULOTOMY METATARSOPHALANGEAL Left  04/19/2017   Procedure: CAPSULOTOMY METATARSOPHALANGEAL;  Surgeon: Ashley Soulier, DPM;  Location: [facility];  Service: Podiatry;  Laterality: Left;   CATARACT EXTRACTION W/PHACO Right 08/16/2021   Procedure: CATARACT EXTRACTION PHACO AND INTRAOCULAR LENS PLACEMENT (IOC) RIGHT Eyhance Toric;  Surgeon: Myrna Adine Anes, MD;  Location: [facility];  Service: Ophthalmology;  Laterality: Right;  5.02 00:33.8   CATARACT EXTRACTION W/PHACO Left 08/30/2021   Procedure: CATARACT EXTRACTION PHACO AND INTRAOCULAR LENS PLACEMENT (IOC) LEFT 5.10 00:43.4;  Surgeon: Myrna Adine Anes, MD;  Location: [facility];  Service: Ophthalmology;  Laterality: Left;   COLECTOMY     COLONOSCOPY WITH PROPOFOL  N/A 09/26/2017   Procedure: COLONOSCOPY WITH PROPOFOL ;  Surgeon: Gaylyn Gladis PENNER, MD;  Location: [facility];  Service: Endoscopy;  Laterality: N/A;   ESOPHAGOGASTRODUODENOSCOPY N/A 04/21/2015   Procedure: ESOPHAGOGASTRODUODENOSCOPY (EGD);  Surgeon: Gladis PENNER Gaylyn, MD;  Location: [facility];  Service: Endoscopy;  Laterality: N/A;   Esophgeal cancer     HAMMER TOE SURGERY Left 04/19/2017   Procedure: HAMMER TOE CORRECTION-3RD & 4TH;  Surgeon: Ashley Soulier, DPM;  Location: [facility];  Service: Podiatry;  Laterality: Left;   LOBECTOMY Right    METATARSAL OSTEOTOMY Left 04/19/2017   Procedure: PHALANX OSTEOTOMY-AKIN - LEFT;  Surgeon: Ashley Soulier, DPM;  Location: [facility];  Service: Podiatry;  Laterality: Left;   PARTIAL HYSTERECTOMY     SINUSOTOMY  Family History No h/o GI disease or malignancy  Review of Systems  Constitutional:  Negative for activity change, appetite change, chills, diaphoresis, fatigue, fever and unexpected weight change.  HENT:  Negative for trouble swallowing and voice change.   Respiratory:  Negative for shortness of breath and wheezing.   Cardiovascular:  Negative for chest pain, palpitations and leg swelling.   Gastrointestinal:  Positive for constipation. Negative for abdominal distention, abdominal pain, anal bleeding, blood in stool, diarrhea, nausea, rectal pain and vomiting.  Musculoskeletal:  Negative for arthralgias and myalgias.  Skin:  Negative for color change and pallor.  Neurological:  Negative for dizziness, syncope and weakness.  Psychiatric/Behavioral:  Negative for confusion.   All other systems reviewed and are negative.    Medications Current Facility-Administered Medications on File Prior to Encounter  Medication Dose Route Frequency Provider Last Rate Last Admin   0.9 %  sodium chloride  infusion   Intravenous Continuous Lionell Sonny FALCON, NP 999 mL/hr at 06/28/24 0948 Continued from Pre-op at 06/28/24 0948   heparin  lock flush 100 unit/mL  500 Units Intravenous Once Choksi, Vester, MD       heparin  lock flush 100 unit/mL  500 Units Intravenous Once Corcoran, Melissa C, MD       sodium chloride  0.9 % injection 10 mL  10 mL Intravenous PRN Wilder Vester, MD   10 mL at 07/20/15 1147   sodium chloride  flush (NS) 0.9 % injection 10 mL  10 mL Intravenous PRN Corcoran, Melissa C, MD   10 mL at 03/22/17 1235   Current Outpatient Medications on File Prior to Encounter  Medication Sig Dispense Refill   amLODipine  (NORVASC ) 10 MG tablet TAKE ONE TABLET BY MOUTH ONCE DAILY-NEED  APPOINTMENT (Patient taking differently: Take 10 mg by mouth daily.) 90 tablet 2   BIOTIN PO Take 1 tablet by mouth daily.     buPROPion  (WELLBUTRIN  SR) 150 MG 12 hr tablet Take 1 tablet (150 mg total) by mouth 2 (two) times daily. 180 tablet 2   Cholecalciferol (VITAMIN D-3) 1000 UNITS CAPS Take 1,000 Units daily by mouth.      diltiazem (CARDIZEM) 30 MG tablet Take 1 tablet by mouth 4 (four) times daily as needed.     erythromycin  ophthalmic ointment Apply to sutures 4 times a day for 10-12 days.  Discontinue if allergy develops and call our office 3.5 g 2   fexofenadine (ALLEGRA) 180 MG tablet Take 180 mg by  mouth at bedtime.     ipratropium (ATROVENT ) 0.06 % nasal spray Place 2 sprays into both nostrils 4 (four) times daily. 15 mL 12   Multiple Vitamins-Minerals (MULTIVITAMIN PO) Take 1 tablet daily by mouth.     Multiple Vitamins-Minerals (OCUVITE ADULT 50+ PO) Take 1 tablet by mouth daily.     nortriptyline (PAMELOR) 10 MG capsule Take by mouth.     pantoprazole  (PROTONIX ) 40 MG tablet Take 1 tablet (40 mg total) by mouth 2 (two) times daily. 180 tablet 2   ramipril  (ALTACE ) 10 MG capsule TAKE ONE CAPSULE BY MOUTH ONCE DAILY. MUST SCHEDULE APPOINTMENT FOR NOVEMBER 90 capsule 2   senna-docusate (SENOKOT-S) 8.6-50 MG tablet Take 2 tablets by mouth at bedtime.     sertraline  (ZOLOFT ) 25 MG tablet Take 1 tablet (25 mg total) by mouth daily. (Patient taking differently: Take 100 mg by mouth daily.) 90 tablet 2   venlafaxine XR (EFFEXOR-XR) 150 MG 24 hr capsule Take 150 mg by mouth daily.     apixaban  (ELIQUIS )  5 MG TABS tablet Take 1 tablet (5 mg total) by mouth 2 (two) times daily. 60 tablet 0   atorvastatin  (LIPITOR ) 40 MG tablet Take 1 tablet (40 mg total) by mouth daily. 30 tablet 0   benzonatate  (TESSALON ) 100 MG capsule Take 2 capsules (200 mg total) by mouth every 8 (eight) hours. 21 capsule 0   diphenhydrAMINE  (BENADRYL ) 25 mg capsule Take 50-75 mg every 6 (six) hours as needed by mouth for itching.      fluticasone  (FLONASE ) 50 MCG/ACT nasal spray USE ONE SPRAY(S) IN EACH NOSTRIL ONCE DAILY (Patient taking differently: Place 2 sprays into both nostrils daily.) 16 g 11   hydrochlorothiazide  (MICROZIDE ) 12.5 MG capsule Take 1 capsule (12.5 mg total) by mouth daily. 30 capsule 0   nitrofurantoin , macrocrystal-monohydrate, (MACROBID ) 100 MG capsule Take 1 capsule (100 mg total) by mouth 2 (two) times daily. (Patient not taking: Reported on 06/28/2024) 10 capsule 0   phenazopyridine  (PYRIDIUM ) 200 MG tablet Take 1 tablet (200 mg total) by mouth 3 (three) times daily. (Patient not taking: Reported on  06/28/2024) 6 tablet 0   sotalol  (BETAPACE ) 80 MG tablet Take 1 tablet (80 mg total) by mouth 2 (two) times daily. 60 tablet 0   [DISCONTINUED] sucralfate  (CARAFATE ) 1 GM/10ML suspension Take by mouth. Cancer Doc      Pertinent medications related to GI and procedure were reviewed by me with the patient prior to the procedure   Current Facility-Administered Medications:    0.9 %  sodium chloride  infusion, , Intravenous, Continuous, Onita Elspeth Sharper, DO, Last Rate: 20 mL/hr at 06/28/24 0951, 500 mL at 06/28/24 9048  Facility-Administered Medications Ordered in Other Encounters:    0.9 %  sodium chloride  infusion, , Intravenous, Continuous, Herring, Sonny FALCON, NP, Last Rate: 999 mL/hr at 06/28/24 0948, Continued from Pre-op at 06/28/24 0948   heparin  lock flush 100 unit/mL, 500 Units, Intravenous, Once, Choksi, Vester, MD   heparin  lock flush 100 unit/mL, 500 Units, Intravenous, Once, Corcoran, Melissa C, MD   sodium chloride  0.9 % injection 10 mL, 10 mL, Intravenous, PRN, Choksi, Janak, MD, 10 mL at 07/20/15 1147   sodium chloride  flush (NS) 0.9 % injection 10 mL, 10 mL, Intravenous, PRN, Rudell, Melissa C, MD, 10 mL at 03/22/17 1235  sodium chloride  500 mL (06/28/24 0951)       Allergies  Allergen Reactions   Amoxicillin-Pot Clavulanate Itching, Nausea Only and Other (See Comments)    Has patient had a PCN reaction causing immediate rash, facial/tongue/throat swelling, SOB or lightheadedness with hypotension: No Has patient had a PCN reaction causing severe rash involving mucus membranes or skin necrosis: No Has patient had a PCN reaction that required hospitalization: No Has patient had a PCN reaction occurring within the last 10 years: Yes If all of the above answers are NO, then may proceed with Cephalosporin use.    Codeine Nausea Only and Other (See Comments)    GI Upset headaches   Demerol [Meperidine] Itching, Nausea Only and Other (See Comments)    GI Upset, Headaches     Doxycycline  Hives and Itching   Latex Hives and Itching   Morphine And Codeine Itching, Nausea Only and Other (See Comments)    Headaches, pt can still take    Oxycodone -Acetaminophen  Itching and Nausea Only   Tramadol Other (See Comments)     GI Upset   Buprenorphine Hcl Itching, Nausea Only and Other (See Comments)    Headaches   Fentanyl  Itching and Nausea And  Vomiting   Strawberry Extract Nausea And Vomiting and Rash   Tapentadol Rash   Allergies were reviewed by me prior to the procedure  Objective   Body mass index is 25.84 kg/m. Vitals:   06/28/24 0947  BP: (!) 144/76  Pulse: 79  Resp: 20  Temp: (!) 97 F (36.1 C)  TempSrc: Temporal  SpO2: 99%  Weight: 74.8 kg  Height: 5' 7 (1.702 m)     Physical Exam Vitals and nursing note reviewed.  Constitutional:      General: She is not in acute distress.    Appearance: Normal appearance. She is not ill-appearing, toxic-appearing or diaphoretic.  HENT:     Head: Normocephalic and atraumatic.     Nose: Nose normal.     Mouth/Throat:     Mouth: Mucous membranes are moist.     Pharynx: Oropharynx is clear.  Eyes:     General: No scleral icterus.    Extraocular Movements: Extraocular movements intact.  Cardiovascular:     Rate and Rhythm: Normal rate and regular rhythm.     Heart sounds: Normal heart sounds. No murmur heard.    No friction rub. No gallop.  Pulmonary:     Effort: Pulmonary effort is normal. No respiratory distress.     Breath sounds: No wheezing, rhonchi or rales.     Comments: diminished Abdominal:     General: Bowel sounds are normal. There is no distension.     Palpations: Abdomen is soft.     Tenderness: There is no abdominal tenderness. There is no guarding or rebound.  Musculoskeletal:     Cervical back: Neck supple.     Right lower leg: No edema.     Left lower leg: No edema.  Skin:    General: Skin is warm and dry.     Coloration: Skin is not jaundiced or pale.  Neurological:      General: No focal deficit present.     Mental Status: She is alert and oriented to person, place, and time. Mental status is at baseline.  Psychiatric:        Mood and Affect: Mood normal.        Behavior: Behavior normal.        Thought Content: Thought content normal.        Judgment: Judgment normal.      Assessment:  Ms. Makaela Cando is a 83 y.o. female  who presents today for Colonoscopy for Personal history of colon cancer .  Plan:  Colonoscopy with possible intervention today  Colonoscopy with possible biopsy, control of bleeding, polypectomy, and interventions as necessary has been discussed with the patient/patient representative. Informed consent was obtained from the patient/patient representative after explaining the indication, nature, and risks of the procedure including but not limited to death, bleeding, perforation, missed neoplasm/lesions, cardiorespiratory compromise, and reaction to medications. Opportunity for questions was given and appropriate answers were provided. Patient/patient representative has verbalized understanding is amenable to undergoing the procedure.   [name], DO  [facility]  Portions of the record may have been created with voice recognition software. Occasional wrong-word or 'sound-a-like' substitutions may have occurred due to the inherent limitations of voice recognition software.  Read the chart carefully and recognize, using context, where substitutions may have occurred.

## 2024-06-30 NOTE — Anesthesia Postprocedure Evaluation (Signed)
 Anesthesia Post Note  Patient: Kelly Krause  Procedure(s) Performed: COLONOSCOPY POLYPECTOMY, INTESTINE  Patient location during evaluation: Endoscopy Anesthesia Type: General Level of consciousness: awake and alert Pain management: pain level controlled Vital Signs Assessment: post-procedure vital signs reviewed and stable Respiratory status: spontaneous breathing, nonlabored ventilation, respiratory function stable and patient connected to nasal cannula oxygen Cardiovascular status: blood pressure returned to baseline and stable Postop Assessment: no apparent nausea or vomiting Anesthetic complications: no   No notable events documented.   Last Vitals:  Vitals:   06/28/24 1051 06/28/24 1103  BP: (!) 159/78 (!) 162/72  Pulse: 76 75  Resp: 16 14  Temp: (!) 36.1 C (!) 36.1 C  SpO2: 97% 98%    Last Pain:  Vitals:   06/28/24 1103  TempSrc:   PainSc: 0-No pain                 Prentice Murphy

## 2024-07-01 LAB — SURGICAL PATHOLOGY

## 2024-07-09 ENCOUNTER — Other Ambulatory Visit: Payer: Self-pay | Admitting: Orthopedic Surgery

## 2024-07-17 ENCOUNTER — Encounter
Admission: RE | Admit: 2024-07-17 | Discharge: 2024-07-17 | Disposition: A | Discharge status: Home / Self Care | Source: Ambulatory Visit | Attending: Orthopedic Surgery | Admitting: Orthopedic Surgery

## 2024-07-17 ENCOUNTER — Other Ambulatory Visit: Payer: Self-pay

## 2024-07-17 VITALS — BP 148/65 | HR 74 | Resp 12 | Ht 67.0 in | Wt 163.4 lb

## 2024-07-17 DIAGNOSIS — Z01818 Encounter for other preprocedural examination: Secondary | ICD-10-CM

## 2024-07-17 DIAGNOSIS — Z01812 Encounter for preprocedural laboratory examination: Secondary | ICD-10-CM | POA: Insufficient documentation

## 2024-07-17 DIAGNOSIS — M1612 Unilateral primary osteoarthritis, left hip: Secondary | ICD-10-CM

## 2024-07-17 HISTORY — DX: Malignant neoplasm of esophagus, unspecified: C15.9

## 2024-07-17 HISTORY — DX: Long term (current) use of anticoagulants: Z79.01

## 2024-07-17 HISTORY — DX: Type 2 diabetes mellitus without complications: E11.9

## 2024-07-17 HISTORY — DX: Unilateral primary osteoarthritis, left hip: M16.12

## 2024-07-17 HISTORY — DX: Personal history of other medical treatment: Z92.89

## 2024-07-17 LAB — COMPREHENSIVE METABOLIC PANEL WITH GFR
ALT: 27 U/L (ref 0–44)
AST: 32 U/L (ref 15–41)
Albumin: 3.9 g/dL (ref 3.5–5.0)
Alkaline Phosphatase: 75 U/L (ref 38–126)
Anion gap: 11 (ref 5–15)
BUN: 24 mg/dL — ABNORMAL HIGH (ref 8–23)
CO2: 29 mmol/L (ref 22–32)
Calcium: 9.4 mg/dL (ref 8.9–10.3)
Chloride: 100 mmol/L (ref 98–111)
Creatinine, Ser: 0.84 mg/dL (ref 0.44–1.00)
GFR, Estimated: >60 mL/min (ref >60)
Glucose, Bld: 121 mg/dL — ABNORMAL HIGH (ref 70–99)
Potassium: 4 mmol/L (ref 3.5–5.1)
Sodium: 140 mmol/L (ref 135–145)
Total Bilirubin: 0.7 mg/dL (ref 0.0–1.2)
Total Protein: 7.7 g/dL (ref 6.5–8.1)

## 2024-07-17 LAB — TYPE AND SCREEN
ABO/RH(D): O NEG
Antibody Screen: NEGATIVE

## 2024-07-17 LAB — SURGICAL PCR SCREEN
MRSA, PCR: NEGATIVE
Staphylococcus aureus: NEGATIVE

## 2024-07-17 NOTE — Patient Instructions (Addendum)
 Your procedure is scheduled on:07-29-24 Monday Report to the Registration Desk on the 1st floor of the [facility].Then proceed to the 2nd floor Surgery Desk To find out your arrival time, please call [phone] between 1PM - 3PM on:07-26-24 Friday If your arrival time is 6:00 am, do not arrive before that time as the [facility] entrance doors do not open until 6:00 am.  REMEMBER: Instructions that are not followed completely may result in serious medical risk, up to and including death; or upon the discretion of your surgeon and anesthesiologist your surgery may need to be rescheduled.  Do not eat food after midnight the night before surgery.  No gum chewing or hard candies.  You may however, drink Water up to 2 hours before you are scheduled to arrive for your surgery. Do not drink anything within 2 hours of your scheduled arrival time.  In addition, your doctor has ordered for you to drink the provided:  Gatorade G2 Drinking this carbohydrate drink up to two hours before surgery helps to reduce insulin resistance and improve patient outcomes. Please complete drinking 2 hours before scheduled arrival time.  One week prior to surgery:Last dose will be on 07-21-24 Stop Anti-inflammatories (NSAIDS) such as Advil , Aleve, Ibuprofen , Motrin , Naproxen, Naprosyn and Aspirin  based products such as Excedrin, Goody's Powder, BC Powder. Stop ANY OVER THE COUNTER supplements until after surgery (Biotin, Ocuvite)  You may however, continue to take Tylenol  if needed for pain up until the day of surgery.  Stop apixaban  (ELIQUIS ) 3 days prior to surgery-Last dose will be on 07-25-24 Thursday  Continue taking all of your other prescription medications up until the day of surgery.  ON THE DAY OF SURGERY ONLY TAKE THESE MEDICATIONS WITH SIPS OF WATER: -buPROPion  (WELLBUTRIN  SR)  -busPIRone (BUSPAR)  -pantoprazole  (PROTONIX )  -sotalol  (BETAPACE )  -venlafaxine XR (EFFEXOR-XR)   No Alcohol for 24 hours  before or after surgery.  No Smoking including e-cigarettes for 24 hours before surgery.  No chewable tobacco products for at least 6 hours before surgery.  No nicotine patches on the day of surgery.  Do not use any recreational drugs for at least a week (preferably 2 weeks) before your surgery.  Please be advised that the combination of cocaine and anesthesia may have negative outcomes, up to and including death. If you test positive for cocaine, your surgery will be cancelled.  On the morning of surgery brush your teeth with toothpaste and water, you may rinse your mouth with mouthwash if you wish. Do not swallow any toothpaste or mouthwash.  Use CHG Soap as directed on instruction sheet.  Do not wear jewelry, make-up, hairpins, clips or nail polish.  For welded (permanent) jewelry: bracelets, anklets, waist bands, etc.  Please have this removed prior to surgery.  If it is not removed, there is a chance that hospital personnel will need to cut it off on the day of surgery.  Do not wear lotions, powders, or perfumes.   Do not shave body hair from the neck down 48 hours before surgery.  Contact lenses, hearing aids and dentures may not be worn into surgery.  Do not bring valuables to the hospital. [facility] is not responsible for any missing/lost belongings or valuables.   Notify your doctor if there is any change in your medical condition (cold, fever, infection).  Wear comfortable clothing (specific to your surgery type) to the hospital.  After surgery, you can help prevent lung complications by doing breathing exercises.  Take  deep breaths and cough every 1-2 hours. Your doctor may order a device called an Incentive Spirometer to help you take deep breaths. When coughing or sneezing, hold a pillow firmly against your incision with both hands. This is called "splinting." Doing this helps protect your incision. It also decreases belly discomfort.  If you are being admitted to  the hospital overnight, leave your suitcase in the car. After surgery it may be brought to your room.  In case of increased patient census, it may be necessary for you, the patient, to continue your postoperative care in the Same Day Surgery department.  If you are being discharged the day of surgery, you will not be allowed to drive home. You will need a responsible individual to drive you home and stay with you for 24 hours after surgery.   If you are taking public transportation, you will need to have a responsible individual with you.  Please call the Pre-admissions Testing Dept. at [phone] if you have any questions about these instructions.  Surgery Visitation Policy:  Patients having surgery or a procedure may have two visitors.  Children under the age of 53 must have an adult with them who is not the patient.  Inpatient Visitation:    Visiting hours are 7 a.m. to 8 p.m. Up to four visitors are allowed at one time in a patient room. The visitors may rotate out with other people during the day.  One visitor age 67 or older may stay with the patient overnight and must be in the room by 8 p.m.    Pre-operative 5 CHG Bath Instructions   You can play a key role in reducing the risk of infection after surgery. Your skin needs to be as free of germs as possible. You can reduce the number of germs on your skin by washing with CHG (chlorhexidine  gluconate) soap before surgery. CHG is an antiseptic soap that kills germs and continues to kill germs even after washing.   DO NOT use if you have an allergy to chlorhexidine /CHG or antibacterial soaps. If your skin becomes reddened or irritated, stop using the CHG and notify one of our RNs at [phone].   Please shower with the CHG soap starting 4 days before surgery using the following schedule:     Please keep in mind the following:  DO NOT shave, including legs and underarms, starting the day of your first shower.   You may  shave your face at any point before/day of surgery.  Place clean sheets on your bed the day you start using CHG soap. Use a clean washcloth (not used since being washed) for each shower. DO NOT sleep with pets once you start using the CHG.   CHG Shower Instructions:  If you choose to wash your hair and private area, wash first with your normal shampoo/soap.  After you use shampoo/soap, rinse your hair and body thoroughly to remove shampoo/soap residue.  Turn the water OFF and apply about 3 tablespoons (45 ml) of CHG soap to a CLEAN washcloth.  Apply CHG soap ONLY FROM YOUR NECK DOWN TO YOUR TOES (washing for 3-5 minutes)  DO NOT use CHG soap on face, private areas, open wounds, or sores.  Pay special attention to the area where your surgery is being performed.  If you are having back surgery, having someone wash your back for you may be helpful. Wait 2 minutes after CHG soap is applied, then you may rinse off the CHG soap.  Pat dry with a clean towel  Put on clean clothes/pajamas   If you choose to wear lotion, please use ONLY the CHG-compatible lotions on the back of this paper.     Additional instructions for the day of surgery: DO NOT APPLY any lotions, deodorants, cologne, or perfumes.   Put on clean/comfortable clothes.  Brush your teeth.  Ask your nurse before applying any prescription medications to the skin.      CHG Compatible Lotions   Aveeno Moisturizing lotion  Cetaphil Moisturizing Cream  Cetaphil Moisturizing Lotion  Clairol Herbal Essence Moisturizing Lotion, Dry Skin  Clairol Herbal Essence Moisturizing Lotion, Extra Dry Skin  Clairol Herbal Essence Moisturizing Lotion, Normal Skin  Curel Age Defying Therapeutic Moisturizing Lotion with Alpha Hydroxy  Curel Extreme Care Body Lotion  Curel Soothing Hands Moisturizing Hand Lotion  Curel Therapeutic Moisturizing Cream, Fragrance-Free  Curel Therapeutic Moisturizing Lotion, Fragrance-Free  Curel Therapeutic  Moisturizing Lotion, Original Formula  Eucerin Daily Replenishing Lotion  Eucerin Dry Skin Therapy Plus Alpha Hydroxy Crme  Eucerin Dry Skin Therapy Plus Alpha Hydroxy Lotion  Eucerin Original Crme  Eucerin Original Lotion  Eucerin Plus Crme Eucerin Plus Lotion  Eucerin TriLipid Replenishing Lotion  Keri Anti-Bacterial Hand Lotion  Keri Deep Conditioning Original Lotion Dry Skin Formula Softly Scented  Keri Deep Conditioning Original Lotion, Fragrance Free Sensitive Skin Formula  Keri Lotion Fast Absorbing Fragrance Free Sensitive Skin Formula  Keri Lotion Fast Absorbing Softly Scented Dry Skin Formula  Keri Original Lotion  Keri Skin Renewal Lotion Keri Silky Smooth Lotion  Keri Silky Smooth Sensitive Skin Lotion  Nivea Body Creamy Conditioning Oil  Nivea Body Extra Enriched Lotion  Nivea Body Original Lotion  Nivea Body Sheer Moisturizing Lotion Nivea Crme  Nivea Skin Firming Lotion  NutraDerm 30 Skin Lotion  NutraDerm Skin Lotion  NutraDerm Therapeutic Skin Cream  NutraDerm Therapeutic Skin Lotion  ProShield Protective Hand Cream  Provon moisturizing lotion  How to Use an Incentive Spirometer An incentive spirometer is a tool that measures how well you are filling your lungs with each breath. Learning to take long, deep breaths using this tool can help you keep your lungs clear and active. This may help to reverse or lessen your chance of developing breathing (pulmonary) problems, especially infection. You may be asked to use a spirometer: After a surgery. If you have a lung problem or a history of smoking. After a long period of time when you have been unable to move or be active. If the spirometer includes an indicator to show the highest number that you have reached, your health care provider or respiratory therapist will help you set a goal. Keep a log of your progress as told by your health care provider. What are the risks? Breathing too quickly may cause dizziness or  cause you to pass out. Take your time so you do not get dizzy or light-headed. If you are in pain, you may need to take pain medicine before doing incentive spirometry. It is harder to take a deep breath if you are having pain. How to use your incentive spirometer  Sit up on the edge of your bed or on a chair. Hold the incentive spirometer so that it is in an upright position. Before you use the spirometer, breathe out normally. Place the mouthpiece in your mouth. Make sure your lips are closed tightly around it. Breathe in slowly and as deeply as you can through your mouth, causing the piston or the ball to rise toward  the top of the chamber. Hold your breath for 3-5 seconds, or for as long as possible. If the spirometer includes a coach indicator, use this to guide you in breathing. Slow down your breathing if the indicator goes above the marked areas. Remove the mouthpiece from your mouth and breathe out normally. The piston or ball will return to the bottom of the chamber. Rest for a few seconds, then repeat the steps 10 or more times. Take your time and take a few normal breaths between deep breaths so that you do not get dizzy or light-headed. Do this every 1-2 hours when you are awake. If the spirometer includes a goal marker to show the highest number you have reached (best effort), use this as a goal to work toward during each repetition. After each set of 10 deep breaths, cough a few times. This will help to make sure that your lungs are clear. If you have an incision on your chest or abdomen from surgery, place a pillow or a rolled-up towel firmly against the incision when you cough. This can help to reduce pain while taking deep breaths and coughing. General tips When you are able to get out of bed: Walk around often. Continue to take deep breaths and cough in order to clear your lungs. Keep using the incentive spirometer until your health care provider says it is okay to stop using  it. If you have been in the hospital, you may be told to keep using the spirometer at home. Contact a health care provider if: You are having difficulty using the spirometer. You have trouble using the spirometer as often as instructed. Your pain medicine is not giving enough relief for you to use the spirometer as told. You have a fever. Get help right away if: You develop shortness of breath. You develop a cough with bloody mucus from the lungs. You have fluid or blood coming from an incision site after you cough. Summary An incentive spirometer is a tool that can help you learn to take long, deep breaths to keep your lungs clear and active. You may be asked to use a spirometer after a surgery, if you have a lung problem or a history of smoking, or if you have been inactive for a long period of time. Use your incentive spirometer as instructed every 1-2 hours while you are awake. If you have an incision on your chest or abdomen, place a pillow or a rolled-up towel firmly against your incision when you cough. This will help to reduce pain. Get help right away if you have shortness of breath, you cough up bloody mucus, or blood comes from your incision when you cough. This information is not intended to replace advice given to you by your health care provider. Make sure you discuss any questions you have with your health care provider. Document Revised: 09/15/2023 Document Reviewed: 09/15/2023 Elsevier Patient Education  2024 [employer] Inc.    Preoperative Educational Videos for Total Hip, Knee and Shoulder Replacements  To better prepare for surgery, please view our videos that explain the physical activity and discharge planning required to have the best surgical recovery at [facility].  [email or web address]  Questions? Call [phone] or email [email or web address]        Community  Resource Directory to address health-related social needs:  [email or web address]/ procedure is scheduled on:

## 2024-07-19 LAB — URINALYSIS, COMPLETE (UACMP) WITH MICROSCOPIC
Bacteria, UA: NONE SEEN
Bilirubin Urine: NEGATIVE
Glucose, UA: NEGATIVE mg/dL
Hgb urine dipstick: NEGATIVE
Ketones, ur: NEGATIVE mg/dL
Leukocytes,Ua: NEGATIVE
Nitrite: NEGATIVE
Protein, ur: NEGATIVE mg/dL
Specific Gravity, Urine: 1.016 (ref 1.005–1.030)
Squamous Epithelial / HPF: 0 /HPF (ref 0–5)
pH: 5 (ref 5.0–8.0)

## 2024-07-24 DIAGNOSIS — G253 Myoclonus: Secondary | ICD-10-CM | POA: Diagnosis not present

## 2024-07-29 ENCOUNTER — Observation Stay
Admission: RE | Admit: 2024-07-29 | Discharge: 2024-08-06 | Disposition: A | Discharge status: Skilled Nursing Facility | Attending: Orthopedic Surgery | Admitting: Orthopedic Surgery

## 2024-07-29 ENCOUNTER — Encounter: Payer: Self-pay | Admitting: Orthopedic Surgery

## 2024-07-29 ENCOUNTER — Encounter
Admission: RE | Disposition: A | Discharge status: Skilled Nursing Facility | Payer: Self-pay | Source: Home / Self Care | Attending: Orthopedic Surgery

## 2024-07-29 ENCOUNTER — Other Ambulatory Visit: Payer: Self-pay

## 2024-07-29 ENCOUNTER — Ambulatory Visit: Payer: Self-pay | Admitting: Urgent Care

## 2024-07-29 ENCOUNTER — Ambulatory Visit

## 2024-07-29 DIAGNOSIS — I4891 Unspecified atrial fibrillation: Secondary | ICD-10-CM

## 2024-07-29 DIAGNOSIS — Z01812 Encounter for preprocedural laboratory examination: Secondary | ICD-10-CM

## 2024-07-29 DIAGNOSIS — Z79899 Other long term (current) drug therapy: Secondary | ICD-10-CM | POA: Diagnosis not present

## 2024-07-29 DIAGNOSIS — Z96642 Presence of left artificial hip joint: Secondary | ICD-10-CM | POA: Diagnosis present

## 2024-07-29 DIAGNOSIS — Z91040 Latex allergy status: Secondary | ICD-10-CM | POA: Diagnosis not present

## 2024-07-29 DIAGNOSIS — Z01818 Encounter for other preprocedural examination: Secondary | ICD-10-CM

## 2024-07-29 DIAGNOSIS — M1612 Unilateral primary osteoarthritis, left hip: Principal | ICD-10-CM | POA: Insufficient documentation

## 2024-07-29 DIAGNOSIS — E119 Type 2 diabetes mellitus without complications: Secondary | ICD-10-CM | POA: Insufficient documentation

## 2024-07-29 DIAGNOSIS — I110 Hypertensive heart disease with heart failure: Secondary | ICD-10-CM | POA: Insufficient documentation

## 2024-07-29 DIAGNOSIS — I509 Heart failure, unspecified: Secondary | ICD-10-CM | POA: Diagnosis not present

## 2024-07-29 DIAGNOSIS — M169 Osteoarthritis of hip, unspecified: Principal | ICD-10-CM

## 2024-07-29 DIAGNOSIS — Z87891 Personal history of nicotine dependence: Secondary | ICD-10-CM | POA: Insufficient documentation

## 2024-07-29 HISTORY — PX: TOTAL HIP ARTHROPLASTY: SHX124

## 2024-07-29 LAB — ABO/RH: ABO/RH(D): O NEG

## 2024-07-29 SURGERY — ARTHROPLASTY, HIP, TOTAL, ANTERIOR APPROACH
Anesthesia: Choice | Site: Hip | Laterality: Left

## 2024-07-29 MED ORDER — KETOROLAC TROMETHAMINE 15 MG/ML IJ SOLN
INTRAMUSCULAR | Status: AC
  Filled 2024-07-29: qty 1

## 2024-07-29 MED ORDER — CEFAZOLIN SODIUM-DEXTROSE 2-4 GM/100ML-% IV SOLN
2.0000 g | INTRAVENOUS | Status: AC
  Administered 2024-07-29: 2 g via INTRAVENOUS

## 2024-07-29 MED ORDER — METOCLOPRAMIDE HCL 5 MG/ML IJ SOLN: 5.0000 mg | Freq: Three times a day (TID) | INTRAMUSCULAR | Status: DC | PRN

## 2024-07-29 MED ORDER — BUPIVACAINE-EPINEPHRINE (PF) 0.25% -1:200000 IJ SOLN
INTRAMUSCULAR | Status: DC | PRN
  Administered 2024-07-29: 20 mL

## 2024-07-29 MED ORDER — TRANEXAMIC ACID-NACL 1000-0.7 MG/100ML-% IV SOLN
INTRAVENOUS | Status: AC
  Filled 2024-07-29: qty 100

## 2024-07-29 MED ORDER — SODIUM CHLORIDE 0.9 % IV SOLN: INTRAVENOUS | Status: DC

## 2024-07-29 MED ORDER — DOCUSATE SODIUM 100 MG PO CAPS
100.0000 mg | ORAL_CAPSULE | Freq: Two times a day (BID) | ORAL | Status: DC
  Administered 2024-07-29 – 2024-08-06 (×16): 100 mg via ORAL
  Filled 2024-07-29 (×16): qty 1

## 2024-07-29 MED ORDER — SODIUM CHLORIDE 0.9 % IV SOLN
INTRAVENOUS | Status: DC | PRN
  Administered 2024-07-29: 30 mL

## 2024-07-29 MED ORDER — EPHEDRINE SULFATE-NACL 50-0.9 MG/10ML-% IV SOSY
PREFILLED_SYRINGE | INTRAVENOUS | Status: DC | PRN
  Administered 2024-07-29 (×2): 5 mg via INTRAVENOUS

## 2024-07-29 MED ORDER — HYDROCHLOROTHIAZIDE 12.5 MG PO CAPS
12.5000 mg | ORAL_CAPSULE | ORAL | Status: DC
Start: 2024-07-30 — End: 2024-07-29

## 2024-07-29 MED ORDER — ACETAMINOPHEN 10 MG/ML IV SOLN
INTRAVENOUS | Status: DC | PRN
  Administered 2024-07-29: 1000 mg via INTRAVENOUS

## 2024-07-29 MED ORDER — SURGIPHOR WOUND IRRIGATION SYSTEM - OPTIME
TOPICAL | Status: DC | PRN
  Administered 2024-07-29: 450 mL via TOPICAL

## 2024-07-29 MED ORDER — PHENYLEPHRINE 80 MCG/ML (10ML) SYRINGE FOR IV PUSH (FOR BLOOD PRESSURE SUPPORT)
PREFILLED_SYRINGE | INTRAVENOUS | Status: AC
Start: 2024-07-29 — End: 2024-07-29
  Filled 2024-07-29: qty 10

## 2024-07-29 MED ORDER — APIXABAN 5 MG PO TABS
5.0000 mg | ORAL_TABLET | Freq: Two times a day (BID) | ORAL | Status: DC
  Administered 2024-07-30 – 2024-08-06 (×15): 5 mg via ORAL
  Filled 2024-07-29 (×3): qty 1
  Filled 2024-07-29: qty 2
  Filled 2024-07-29 (×4): qty 1
  Filled 2024-07-29 (×3): qty 2
  Filled 2024-07-29: qty 1
  Filled 2024-07-29: qty 2
  Filled 2024-07-29 (×2): qty 1

## 2024-07-29 MED ORDER — ONDANSETRON HCL 4 MG/2ML IJ SOLN
4.0000 mg | Freq: Four times a day (QID) | INTRAMUSCULAR | Status: DC | PRN
  Administered 2024-07-31: 4 mg via INTRAVENOUS
  Filled 2024-07-29: qty 2

## 2024-07-29 MED ORDER — RAMIPRIL 5 MG PO CAPS
10.0000 mg | ORAL_CAPSULE | Freq: Every day | ORAL | Status: DC
  Administered 2024-07-29 – 2024-08-06 (×9): 10 mg via ORAL
  Filled 2024-07-29 (×9): qty 2

## 2024-07-29 MED ORDER — FENTANYL CITRATE (PF) 100 MCG/2ML IJ SOLN
INTRAMUSCULAR | Status: AC
  Filled 2024-07-29: qty 2

## 2024-07-29 MED ORDER — METOCLOPRAMIDE HCL 5 MG PO TABS: 5.0000 mg | ORAL_TABLET | Freq: Three times a day (TID) | ORAL | Status: DC | PRN

## 2024-07-29 MED ORDER — BUPIVACAINE LIPOSOME 1.3 % IJ SUSP
INTRAMUSCULAR | Status: AC
  Filled 2024-07-29: qty 20

## 2024-07-29 MED ORDER — FUROSEMIDE 20 MG PO TABS
20.0000 mg | ORAL_TABLET | Freq: Every day | ORAL | Status: DC
Start: 2024-07-30 — End: 2024-08-06
  Administered 2024-07-30 – 2024-08-06 (×8): 20 mg via ORAL
  Filled 2024-07-29 (×8): qty 1

## 2024-07-29 MED ORDER — DEXAMETHASONE SODIUM PHOSPHATE 10 MG/ML IJ SOLN
INTRAMUSCULAR | Status: AC
Start: 2024-07-29 — End: 2024-07-29
  Filled 2024-07-29: qty 1

## 2024-07-29 MED ORDER — LIDOCAINE HCL (CARDIAC) PF 100 MG/5ML IV SOSY
PREFILLED_SYRINGE | INTRAVENOUS | Status: DC | PRN
  Administered 2024-07-29: 60 mg via INTRAVENOUS

## 2024-07-29 MED ORDER — TRANEXAMIC ACID-NACL 1000-0.7 MG/100ML-% IV SOLN
INTRAVENOUS | Status: AC
Start: 2024-07-29 — End: 2024-07-29
  Filled 2024-07-29: qty 100

## 2024-07-29 MED ORDER — ATORVASTATIN CALCIUM 20 MG PO TABS
40.0000 mg | ORAL_TABLET | Freq: Every day | ORAL | Status: DC
  Administered 2024-07-29 – 2024-08-05 (×8): 40 mg via ORAL
  Filled 2024-07-29: qty 4
  Filled 2024-07-29: qty 2
  Filled 2024-07-29: qty 4
  Filled 2024-07-29 (×2): qty 2
  Filled 2024-07-29: qty 4
  Filled 2024-07-29 (×2): qty 2

## 2024-07-29 MED ORDER — ONDANSETRON HCL 4 MG PO TABS: 4.0000 mg | ORAL_TABLET | Freq: Four times a day (QID) | ORAL | Status: DC | PRN

## 2024-07-29 MED ORDER — EPINEPHRINE PF 1 MG/ML IJ SOLN
INTRAMUSCULAR | Status: AC
  Filled 2024-07-29: qty 1

## 2024-07-29 MED ORDER — SOTALOL HCL 80 MG PO TABS
80.0000 mg | ORAL_TABLET | Freq: Two times a day (BID) | ORAL | Status: DC
Start: 2024-07-29 — End: 2024-07-30

## 2024-07-29 MED ORDER — BUPIVACAINE HCL (PF) 0.5 % IJ SOLN
INTRAMUSCULAR | Status: AC
  Filled 2024-07-29: qty 10

## 2024-07-29 MED ORDER — PROPOFOL 1000 MG/100ML IV EMUL
INTRAVENOUS | Status: AC
  Filled 2024-07-29: qty 100

## 2024-07-29 MED ORDER — ORAL CARE MOUTH RINSE: 15.0000 mL | OROMUCOSAL | Status: DC | PRN

## 2024-07-29 MED ORDER — ONDANSETRON HCL 4 MG/2ML IJ SOLN
INTRAMUSCULAR | Status: DC | PRN
  Administered 2024-07-29: 4 mg via INTRAVENOUS

## 2024-07-29 MED ORDER — MIDAZOLAM HCL 2 MG/2ML IJ SOLN
INTRAMUSCULAR | Status: AC
  Filled 2024-07-29: qty 2

## 2024-07-29 MED ORDER — OXYCODONE HCL 5 MG PO TABS
5.0000 mg | ORAL_TABLET | ORAL | Status: DC | PRN
  Administered 2024-07-29 – 2024-08-06 (×21): 5 mg via ORAL
  Filled 2024-07-29 (×24): qty 1

## 2024-07-29 MED ORDER — DEXAMETHASONE SODIUM PHOSPHATE 10 MG/ML IJ SOLN: 8.0000 mg | Freq: Once | INTRAMUSCULAR | Status: DC

## 2024-07-29 MED ORDER — ACETAMINOPHEN 325 MG PO TABS
325.0000 mg | ORAL_TABLET | Freq: Four times a day (QID) | ORAL | Status: DC | PRN
  Administered 2024-07-31 – 2024-08-05 (×5): 650 mg via ORAL
  Filled 2024-07-29 (×6): qty 2

## 2024-07-29 MED ORDER — CHLORHEXIDINE GLUCONATE 0.12 % MT SOLN
15.0000 mL | Freq: Once | OROMUCOSAL | Status: AC
  Administered 2024-07-29: 15 mL via OROMUCOSAL

## 2024-07-29 MED ORDER — BUSPIRONE HCL 15 MG PO TABS
7.5000 mg | ORAL_TABLET | Freq: Two times a day (BID) | ORAL | Status: DC
  Administered 2024-07-29 – 2024-08-06 (×16): 7.5 mg via ORAL
  Filled 2024-07-29 (×17): qty 1

## 2024-07-29 MED ORDER — EPHEDRINE 5 MG/ML INJ
INTRAVENOUS | Status: AC
  Filled 2024-07-29: qty 5

## 2024-07-29 MED ORDER — PHENOL 1.4 % MT LIQD: 1.0000 | OROMUCOSAL | Status: DC | PRN

## 2024-07-29 MED ORDER — ACETAMINOPHEN 10 MG/ML IV SOLN
INTRAVENOUS | Status: AC
  Filled 2024-07-29: qty 100

## 2024-07-29 MED ORDER — VENLAFAXINE HCL ER 150 MG PO CP24
150.0000 mg | ORAL_CAPSULE | Freq: Every day | ORAL | Status: DC
  Administered 2024-07-30 – 2024-08-06 (×8): 150 mg via ORAL
  Filled 2024-07-29 (×9): qty 1

## 2024-07-29 MED ORDER — ONDANSETRON HCL 4 MG/2ML IJ SOLN
INTRAMUSCULAR | Status: AC
  Filled 2024-07-29: qty 2

## 2024-07-29 MED ORDER — LIDOCAINE HCL (PF) 2 % IJ SOLN
INTRAMUSCULAR | Status: AC
  Filled 2024-07-29: qty 5

## 2024-07-29 MED ORDER — SODIUM CHLORIDE (PF) 0.9 % IJ SOLN
INTRAMUSCULAR | Status: AC
Start: 2024-07-29 — End: 2024-07-29
  Filled 2024-07-29: qty 10

## 2024-07-29 MED ORDER — DEXAMETHASONE SODIUM PHOSPHATE 10 MG/ML IJ SOLN
INTRAMUSCULAR | Status: DC | PRN
  Administered 2024-07-29: 4 mg via INTRAVENOUS

## 2024-07-29 MED ORDER — HYDROCHLOROTHIAZIDE 12.5 MG PO TABS
12.5000 mg | ORAL_TABLET | Freq: Every day | ORAL | Status: DC
  Administered 2024-07-30 – 2024-08-06 (×8): 12.5 mg via ORAL
  Filled 2024-07-29 (×8): qty 1

## 2024-07-29 MED ORDER — TRANEXAMIC ACID-NACL 1000-0.7 MG/100ML-% IV SOLN
1000.0000 mg | INTRAVENOUS | Status: AC
  Administered 2024-07-29 (×2): 1000 mg via INTRAVENOUS

## 2024-07-29 MED ORDER — PHENYLEPHRINE 80 MCG/ML (10ML) SYRINGE FOR IV PUSH (FOR BLOOD PRESSURE SUPPORT)
PREFILLED_SYRINGE | INTRAVENOUS | Status: DC | PRN
  Administered 2024-07-29 (×2): 80 ug via INTRAVENOUS

## 2024-07-29 MED ORDER — BUPIVACAINE HCL (PF) 0.25 % IJ SOLN
INTRAMUSCULAR | Status: AC
  Filled 2024-07-29: qty 30

## 2024-07-29 MED ORDER — DIPHENHYDRAMINE HCL 12.5 MG/5ML PO ELIX
12.5000 mg | ORAL_SOLUTION | ORAL | Status: DC | PRN
  Administered 2024-07-29: 25 mg via ORAL
  Filled 2024-07-29: qty 10

## 2024-07-29 MED ORDER — OXYCODONE HCL 5 MG PO TABS
2.5000 mg | ORAL_TABLET | ORAL | Status: DC | PRN
  Administered 2024-07-29 – 2024-08-05 (×8): 2.5 mg via ORAL
  Filled 2024-07-29 (×8): qty 1

## 2024-07-29 MED ORDER — ACETAMINOPHEN 500 MG PO TABS
1000.0000 mg | ORAL_TABLET | Freq: Three times a day (TID) | ORAL | Status: AC
  Administered 2024-07-29 – 2024-07-30 (×4): 1000 mg via ORAL
  Filled 2024-07-29 (×4): qty 2

## 2024-07-29 MED ORDER — MORPHINE SULFATE (PF) 4 MG/ML IV SOLN
0.5000 mg | INTRAVENOUS | Status: DC | PRN
  Administered 2024-08-04 (×2): 1 mg via INTRAVENOUS
  Filled 2024-07-29 (×2): qty 1

## 2024-07-29 MED ORDER — ORAL CARE MOUTH RINSE: 15.0000 mL | Freq: Once | OROMUCOSAL | Status: AC

## 2024-07-29 MED ORDER — HYDROCHLOROTHIAZIDE 12.5 MG PO TABS
12.5000 mg | ORAL_TABLET | Freq: Once | ORAL | Status: AC
  Administered 2024-07-29: 12.5 mg via ORAL
  Filled 2024-07-29: qty 1

## 2024-07-29 MED ORDER — KETOROLAC TROMETHAMINE 15 MG/ML IJ SOLN
7.5000 mg | Freq: Four times a day (QID) | INTRAMUSCULAR | Status: AC
  Administered 2024-07-29 – 2024-07-30 (×4): 7.5 mg via INTRAVENOUS
  Filled 2024-07-29 (×3): qty 1

## 2024-07-29 MED ORDER — HYDROCHLOROTHIAZIDE 25 MG PO TABS
ORAL_TABLET | ORAL | Status: AC
  Filled 2024-07-29: qty 1

## 2024-07-29 MED ORDER — PANTOPRAZOLE SODIUM 40 MG PO TBEC
40.0000 mg | DELAYED_RELEASE_TABLET | Freq: Every day | ORAL | Status: DC
  Administered 2024-07-29 – 2024-08-05 (×8): 40 mg via ORAL
  Filled 2024-07-29 (×8): qty 1

## 2024-07-29 MED ORDER — BUPIVACAINE HCL (PF) 0.5 % IJ SOLN
INTRAMUSCULAR | Status: DC | PRN
  Administered 2024-07-29: 3 mL via INTRATHECAL

## 2024-07-29 MED ORDER — PROPOFOL 500 MG/50ML IV EMUL
INTRAVENOUS | Status: DC | PRN
  Administered 2024-07-29: 50 ug/kg/min via INTRAVENOUS

## 2024-07-29 MED ORDER — CEFAZOLIN SODIUM-DEXTROSE 2-4 GM/100ML-% IV SOLN
INTRAVENOUS | Status: AC
Start: 2024-07-29 — End: 2024-07-29
  Filled 2024-07-29: qty 100

## 2024-07-29 MED ORDER — CEFAZOLIN SODIUM-DEXTROSE 2-4 GM/100ML-% IV SOLN
2.0000 g | Freq: Four times a day (QID) | INTRAVENOUS | Status: AC
  Administered 2024-07-29 – 2024-07-30 (×2): 2 g via INTRAVENOUS
  Filled 2024-07-29: qty 100

## 2024-07-29 MED ORDER — PROPOFOL 10 MG/ML IV BOLUS
INTRAVENOUS | Status: DC | PRN
  Administered 2024-07-29: 20 mg via INTRAVENOUS

## 2024-07-29 MED ORDER — 0.9 % SODIUM CHLORIDE (POUR BTL) OPTIME
TOPICAL | Status: DC | PRN
  Administered 2024-07-29: 500 mL

## 2024-07-29 MED ORDER — MENTHOL 3 MG MT LOZG: 1.0000 | LOZENGE | OROMUCOSAL | Status: DC | PRN

## 2024-07-29 MED ORDER — BUPROPION HCL ER (SR) 150 MG PO TB12
150.0000 mg | ORAL_TABLET | Freq: Two times a day (BID) | ORAL | Status: DC
  Administered 2024-07-29 – 2024-08-06 (×16): 150 mg via ORAL
  Filled 2024-07-29 (×16): qty 1

## 2024-07-29 MED ORDER — CHLORHEXIDINE GLUCONATE 0.12 % MT SOLN
OROMUCOSAL | Status: AC
  Filled 2024-07-29: qty 15

## 2024-07-29 MED ORDER — MIDAZOLAM HCL 5 MG/5ML IJ SOLN
INTRAMUSCULAR | Status: DC | PRN
  Administered 2024-07-29 (×2): 1 mg via INTRAVENOUS

## 2024-07-29 SURGICAL SUPPLY — 57 items
BIT DRILL TRIDENT 4X40 SU (BIT) IMPLANT
BLADE CLIPPER SURG (BLADE) IMPLANT
BLADE SAGITTAL AGGR TOOTH XLG (BLADE) ×1 IMPLANT
BNDG COHESIVE 6X5 TAN ST LF (GAUZE/BANDAGES/DRESSINGS) ×2 IMPLANT
BRUSH SCRUB EZ PLAIN DRY (MISCELLANEOUS) ×1 IMPLANT
CHLORAPREP W/TINT 26 (MISCELLANEOUS) ×1 IMPLANT
DERMABOND ADVANCED .7 DNX12 (GAUZE/BANDAGES/DRESSINGS) ×1 IMPLANT
DRAPE C-ARM XRAY 36X54 (DRAPES) ×1 IMPLANT
DRAPE SHEET LG 3/4 BI-LAMINATE (DRAPES) ×2 IMPLANT
DRAPE TABLE BACK 80X90 (DRAPES) ×1 IMPLANT
DRSG MEPILEX SACRM 8.7X9.8 (GAUZE/BANDAGES/DRESSINGS) ×1 IMPLANT
DRSG OPSITE POSTOP 4X8 (GAUZE/BANDAGES/DRESSINGS) ×1 IMPLANT
ELECTRODE BLDE 4.0 EZ CLN MEGD (MISCELLANEOUS) ×1 IMPLANT
ELECTRODE REM PT RTRN 9FT ADLT (ELECTROSURGICAL) ×1 IMPLANT
GLOVE BIOGEL PI IND STRL 8 (GLOVE) ×1 IMPLANT
GLOVE PI ORTHO PRO STRL 7.5 (GLOVE) ×2 IMPLANT
GLOVE PI ORTHO PRO STRL SZ8 (GLOVE) ×2 IMPLANT
GLOVE SURG SYN 7.5 PF PI (GLOVE) ×1 IMPLANT
GOWN SRG XL LVL 3 NONREINFORCE (GOWNS) ×1 IMPLANT
GOWN STRL REUS W/ TWL LRG LVL3 (GOWN DISPOSABLE) ×1 IMPLANT
GOWN STRL REUS W/ TWL XL LVL3 (GOWN DISPOSABLE) ×1 IMPLANT
HEAD CERAMIC FEMORAL 36MM (Head) IMPLANT
HOOD PEEL AWAY T7 (MISCELLANEOUS) ×2 IMPLANT
INSERT TRIDENT POLY 36 0DEG (Insert) IMPLANT
IV NS 100ML SINGLE PACK (IV SOLUTION) ×1 IMPLANT
KIT PATIENT CARE HANA TABLE (KITS) ×1 IMPLANT
KIT TURNOVER CYSTO (KITS) ×1 IMPLANT
LIGHT WAVEGUIDE WIDE FLAT (MISCELLANEOUS) ×1 IMPLANT
MANIFOLD NEPTUNE II (INSTRUMENTS) ×1 IMPLANT
MARKER SKIN DUAL TIP RULER LAB (MISCELLANEOUS) ×1 IMPLANT
MAT ABSORB FLUID 56X50 GRAY (MISCELLANEOUS) ×1 IMPLANT
NDL SPNL 20GX3.5 QUINCKE YW (NEEDLE) ×1 IMPLANT
NEEDLE SPNL 20GX3.5 QUINCKE YW (NEEDLE) ×1 IMPLANT
NS IRRIG 500ML POUR BTL (IV SOLUTION) ×1 IMPLANT
PACK HIP COMPR (MISCELLANEOUS) ×1 IMPLANT
PAD ARMBOARD POSITIONER FOAM (MISCELLANEOUS) ×1 IMPLANT
PENCIL SMOKE EVACUATOR (MISCELLANEOUS) ×1 IMPLANT
SCREW HEX LP 6.5X25 (Screw) IMPLANT
SCREW HEX LP 6.5X30 (Screw) IMPLANT
SHELL CLUSTERHOLE ACETABULAR 5 (Shell) IMPLANT
SLEEVE SCD COMPRESS KNEE MED (STOCKING) ×1 IMPLANT
SOLUTION IRRIG SURGIPHOR (IV SOLUTION) ×1 IMPLANT
STEM HIGH OFFSET SZ5X105 HIGH (Stem) IMPLANT
SURGIFLO W/THROMBIN 8M KIT (HEMOSTASIS) IMPLANT
SUT BONE WAX W31G (SUTURE) ×1 IMPLANT
SUT ETHIBOND 2 V 37 (SUTURE) ×1 IMPLANT
SUT SILK 0 30XBRD TIE 6 (SUTURE) ×1 IMPLANT
SUT STRATAFIX 14 PDO 36 VLT (SUTURE) ×1 IMPLANT
SUT VIC AB 0 CT1 36 (SUTURE) ×1 IMPLANT
SUT VIC AB 2-0 CT2 27 (SUTURE) ×1 IMPLANT
SUTURE STRATA SPIR 4-0 18 (SUTURE) ×1 IMPLANT
SYR 20ML LL LF (SYRINGE) ×2 IMPLANT
TAPE MICROFOAM 4IN (TAPE) IMPLANT
TOWEL OR 17X26 4PK STRL BLUE (TOWEL DISPOSABLE) IMPLANT
TRAP FLUID SMOKE EVACUATOR (MISCELLANEOUS) ×1 IMPLANT
WAND WEREWOLF FASTSEAL 6.0 (MISCELLANEOUS) ×1 IMPLANT
WATER STERILE IRR 1000ML POUR (IV SOLUTION) ×1 IMPLANT

## 2024-07-29 NOTE — Interval H&P Note (Signed)
 Patient history and physical updated. Consent reviewed including risks, benefits, and alternatives to surgery. Patient agrees with above plan to proceed with left anterior total hip arthorplasty.

## 2024-07-29 NOTE — Plan of Care (Signed)
Educated

## 2024-07-29 NOTE — Anesthesia Procedure Notes (Signed)
 Spinal  Patient location during procedure: OR Start time: 07/29/2024 1:47 PM End time: 07/29/2024 1:51 PM Reason for block: surgical anesthesia Staffing Performed: resident/CRNA  Resident/CRNA: Trudy Rankin LABOR, CRNA Performed by: Trudy Rankin LABOR, CRNA Authorized by: Leavy Ned, MD   Preanesthetic Checklist Completed: patient identified, IV checked, site marked, risks and benefits discussed, surgical consent, monitors and equipment checked, pre-op evaluation and timeout performed Spinal Block Patient position: sitting Prep: ChloraPrep Patient monitoring: heart rate, continuous pulse ox, blood pressure and cardiac monitor Approach: midline Location: L4-5 Injection technique: single-shot Needle Needle type: Introducer and Pencan  Needle gauge: 24 G Needle length: 9 cm Assessment Sensory level: T4 Events: CSF return Additional Notes Negative paresthesia. Negative blood return. Positive free-flowing CSF. Expiration date of kit checked and confirmed. Patient tolerated procedure well, without complications.

## 2024-07-29 NOTE — Transfer of Care (Signed)
 Immediate Anesthesia Transfer of Care Note  Patient: Kelly Krause  Procedure(s) Performed: ARTHROPLASTY, HIP, TOTAL, ANTERIOR APPROACH (Left: Hip)  Patient Location: PACU  Anesthesia Type:General  Level of Consciousness: awake, alert , and drowsy  Airway & Oxygen Therapy: Patient Spontanous Breathing and Patient connected to face mask oxygen  Post-op Assessment: Report given to RN and Post -op Vital signs reviewed and stable  Post vital signs: Reviewed and stable  Last Vitals:  Vitals Value Taken Time  BP 157/65 07/29/24 15:42  Temp 36.3 C 07/29/24 15:42  Pulse 67 07/29/24 15:44  Resp 21 07/29/24 15:44  SpO2 100 % 07/29/24 15:44  Vitals shown include unfiled device data.  Last Pain:  Vitals:   07/29/24 1140  TempSrc: Temporal  PainSc: 5       Patients Stated Pain Goal: 0 (07/29/24 1140)  Complications: No notable events documented.

## 2024-07-29 NOTE — Anesthesia Preprocedure Evaluation (Signed)
 Anesthesia Evaluation  Patient identified by MRN, date of birth, ID band Patient awake    Reviewed: Allergy & Precautions, NPO status , Patient's Chart, lab work & pertinent test results  History of Anesthesia Complications Negative for: history of anesthetic complications  Airway Mallampati: II       Dental  (+) Teeth Intact, Dental Advidsory Given   Pulmonary neg pulmonary ROS, former smoker   breath sounds clear to auscultation       Cardiovascular Exercise Tolerance: Good hypertension, Pt. on medications (-) angina + Past MI and +CHF  (-) Cardiac Stents and (-) CABG + dysrhythmias Atrial Fibrillation and Supra Ventricular Tachycardia (-) Valvular Problems/Murmurs Rhythm:Regular     Neuro/Psych  Headaches, neg Seizures PSYCHIATRIC DISORDERS  Depression    CVA, No Residual Symptoms    GI/Hepatic Neg liver ROS, PUD,GERD  Medicated,,  Endo/Other  negative endocrine ROSdiabetes    Renal/GU      Musculoskeletal   Abdominal   Peds  Hematology negative hematology ROS (+)   Anesthesia Other Findings Past Medical History: No date: Allergy No date: Atrial fibrillation (HCC) No date: Cancer [facility])     Comment:  esophagus/chemo and rad No date: Colon cancer (HCC)     Comment:  chemo/rad 15 yrs No date: Colon cancer (HCC)     Comment:  2 yrs ago No date: DDD (degenerative disc disease), cervical No date: Dysrhythmia No date: Elevated lipids No date: GERD (gastroesophageal reflux disease) No date: Hyperlipidemia No date: Hypertension No date: Lung cancer (HCC)     Comment:  chemo No date: Lung cancer (HCC) No date: Major depressive disorder, recurrent episode, moderate (HCC) No date: Persistent disorder of initiating or maintaining sleep No date: Personal history of chemotherapy No date: Personal history of radiation therapy     Comment:  colon 06/03/2021: Stroke (HCC)     Comment:  No Deficits No date: SVT  (supraventricular tachycardia) (HCC)   Reproductive/Obstetrics                              Anesthesia Physical Anesthesia Plan  ASA: 3  Anesthesia Plan: General/Spinal   Post-op Pain Management:    Induction: Intravenous  PONV Risk Score and Plan: 2 and Ondansetron , Dexamethasone , Propofol  infusion, TIVA and Midazolam   Airway Management Planned: Natural Airway and Nasal Cannula  Additional Equipment:   Intra-op Plan:   Post-operative Plan:   Informed Consent: I have reviewed the patients History and Physical, chart, labs and discussed the procedure including the risks, benefits and alternatives for the proposed anesthesia with the patient or authorized representative who has indicated his/her understanding and acceptance.     Dental Advisory Given  Plan Discussed with: Anesthesiologist, CRNA and Surgeon  Anesthesia Plan Comments: (Patient reports no bleeding problems and no anticoagulant use.  Plan for spinal with backup GA  Patient consented for risks of anesthesia including but not limited to:  - adverse reactions to medications - damage to eyes, teeth, lips or other oral mucosa - nerve damage due to positioning  - risk of bleeding, infection and or nerve damage from spinal that could lead to paralysis - risk of headache or failed spinal - damage to teeth, lips or other oral mucosa - sore throat or hoarseness - damage to heart, brain, nerves, lungs, other parts of body or loss of life  Patient voiced understanding and assent.)         Anesthesia Quick Evaluation

## 2024-07-29 NOTE — Op Note (Signed)
 Patient Name: Felise Georgia  FMW:[id number]  Pre-Operative Diagnosis: Left hip Osteoarthritis  Post-Operative Diagnosis: (same)  Procedure: Left Total Hip Arthroplasty  Components/Implants: Cup: Trident Tritanium Clusterhole 52/E w/ x2 screws    Liner: Neutral X3 poly 36/E  Stem: Insignia #5 high offset  Head: Biolox ceramic 36 +0  Date of Surgery: 07/29/2024  Surgeon: Arthea Sheer MD  Assistant: Debby Amber PA (present and scrubbed throughout the case, critical for assistance with exposure, retraction, instrumentation, and closure)   Anesthesiologist: Leavy  Anesthesia: Spinal   EBL: 125cc  IVF:400cc  Complications: None   Brief history: The patient is a 83 year old female with a history of osteoarthritis of the left hip with pain limiting their range of motion and activities of daily living, which has failed multiple attempts at conservative therapy.  The risks and benefits of total hip arthroplasty as definitive surgical treatment were discussed with the patient, who opted to proceed with the operation.  After outpatient medical clearance and optimization was completed the patient was admitted to [facility] for the procedure.  All preoperative films were reviewed and an appropriate surgical plan was made prior to surgery.   Description of procedure: The patient was brought to the operating room where laterality was confirmed by all those present to be the left side.  The patient was administered spinal anesthesia on a stretcher prior to being moved supine on the operating room table. Patient was given an intravenous dose of antibiotics for surgical prophylaxis and TXA.  All bony prominences and extremities were well padded and the patient was securely attached to the table boots, a perineal post was placed and the patient had a safety strap placed.  Surgical site was prepped with alcohol and chlorhexidine . The surgical site over the hip was and draped in  typical sterile fashion with multiple layers of adhesive and nonadhesive drapes.  The incision site was marked out with a sterile marker and care was taken to assess the position of the ASIS and ensure appropriate position for the incision.    A surgical timeout was then called with participation of all staff in the room the patient was then a confirmed again and laterality confirmed.  Incision was made over the anterior lateral aspect of the proximal thigh in line with the TFL.  Appropriate retractors were placed and all bleeding vessels were coagulated within the subcutaneous and fatty layers.  An incision was made in the TFL fascia in the interval was carefully identified.  The lateral ascending branches of the circumflex vessels were identified, cauterized and carefully dissected. The main vessels were then tied with a 0 silk hand tie.  Retractors were placed around the superior lateral and inferior medial aspects of the femoral neck and a capsulotomy was performed exposing the hip joint.  Retraction stitches were placed and the capsulotomy to assist with visualization.  Femoral neck cut was then made and the femoral head was extracted after placing the leg in traction.  Bone wax was then applied to the proximal cut surface of the femur and water cooled bipolar electrocautery was used to address any bleeding around the femoral neck cut.  Retractors were then placed around the acetabulum to fully visualize the joint space, and the remaining labral tissue was removed and pulvinar was removed.   The acetabulum was then sequentially reamed up to the appropriate size in order to get good fit and fill for the acetabular component while under fluoroscopic guidance.  Acetabular component was then placed  and malleted into a secure fit while confirming position and abduction angle and anteversion utilizing fluoroscopy.  2 screws were then placed in the acetabular cup to assist in securing the cup in place. The cup  was irrigated,  a real neutral liner was placed, impacted, and checked for stability. The femur traction was dropped and sequentially externally rotated while performing a release of the posterior and superomedial tissues off of the proximal femur to allow for mobility, care was taken to preserve the external rotators and piriformis attachments.  The remaining interval between the abductors and the capsule was dissected out and a retractor was placed over the superolateral aspect of the femur over the greater trochanter.  The leg was carefully brought down into extension and adducted to provide visualization of the proximal femur for broaching.  The femur was then sequentially broached up to an appropriate size which provided for good fill and stability to the femoral broach.  A trial neck and head were placed on the femoral broach and the leg was brought up for reduction.  The hip was reduced and manual check of stability was performed.  The hip was found to be stable in flexion internal rotation and extension external rotation.  Leg lengths were confirmed on fluoroscopy.   The hip was then dislocated the trial neck and head were removed.  The leg was then brought down into extension and adduction in the proximal femur was reexposed.  The broach trial was removed and the femur was irrigated with normal saline prior to the real femoral stem being implanted.  After the femoral stem was seated and shown to have good fit and fill the appropriate head was impacted the leg was brought up and reduced.  There was good range of motion with stability in flexion internal rotation and extension external rotation on testing.  Leg lengths were found to be appropriate on fluoroscopic evaluation at this time.  The hip was then irrigated with betdine based surgiphor solution and then saline solution.  The capsulotomy was repaired with Ethibond sutures.  A pericapsular and peritrochanteric cocktail with Exparel  and bupivacaine  was  then injected as well as the subcutaneous tissues. The fascia was closed with a #1 barbed running suture.  The deep tissues were closed with Vicryl sutures the subcutaneous tissues were closed with interrupted Vicryl sutures and a running barbed 4-0 suture.  The skin was then reinforced with Dermabond and a sterile dressing was placed.   The patient was awoken from anesthesia transferred off of the operating room table onto a hospital bed where examination of leg lengths found the leg lengths to be equal with a good distal pulse.  The patient was then transferred to the PACU in stable condition.

## 2024-07-29 NOTE — H&P (Signed)
 History of Present Illness: Kelly Krause is an 83 y.o. female who presents for history and physical for left anterior total hip arthroplasty. She has had greater than 1 year of left anterior thigh and groin pain with x-ray showing advanced left hip osteoarthritis with complete loss of joint space throughout the left hip joint with deformity of the femoral head. Patient's pain is constant but increased with activity. Pain can reach 10 out of 10 with standing and walking. Pain is interfering with quality of life and activities of daily living. She underwent a cortisone injection on 04/24/2024 with excellent relief but only for a few weeks. She is seen Dr. Lorelle, discussed total hip arthroplasty and agreed and consented to the procedure. Recent DEXA bone density scan showed osteopenia. Patient will proceed with a anterior total hip arthroplasty  Patient is a non-smoker, well-controlled diabetic with an A1c of 7.0 and a BMI of 26  Past Medical History: Past Medical History:  Diagnosis Date  Allergic rhinitis  Atrial fibrillation (CMS/HHS-HCC)  vs paroxsymal afib. Controlled on sotalol . No anticoagulation due to low CHADS score. Followed by Dr. Bosie.  Cervical spondylosis 03/20/2017  Severe mid to lower cervical degenerative spodylosis most pronounced at C5-6 & C6-7.  Colon cancer (CMS/HHS-HCC) 2018  s/p Rt colectomy at [facility]  Colon carcinoma metastatic to lung (CMS/HHS-HCC) 1999  stage IV colon cancer. s/p chemo. Rt lobectomy.  Colon polyp 10/14/2014  Tubular adenoma  DDD (degenerative disc disease), lumbar 11/05/2014  Depression  Diabetes mellitus, type 2 (CMS/HHS-HCC) 05/2021  A1c 6.7%  Diverticulosis 10/14/2014  Esophageal ulceration 07/08/2014  Gastric polyp 04/21/2015  Gastritis 04/21/2015  GERD (gastroesophageal reflux disease)  Severe sx s/p XRT. Has required multiple dilations 2/2 dysphagia & still w/ recurrent sx.  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 Ashley in podiatry  Squamous cell carcinoma of esophagus (CMS/HHS-HCC) 02/2014  stage IB esophageal cancer. s/p chemo & XRT. Declined surgery.  SVT (supraventricular tachycardia) (CMS/HHS-HCC)   Past Surgical History: Past Surgical History:  Procedure Laterality Date  HYSTERECTOMY 1990  Transvaginal  FUNCTIONAL ENDOSCOPIC SINUS SURGERY 1995  COLON SURGERY 1998  THORACOSCOPY WITH WEDGE RESECTION LUNG 1998  Rt upper lobe  PUBOVAGINAL SLING SYNTHETIC 2002  COLONOSCOPY 10/14/2014  + Tubular Adenoma/ Repeat 3 years/ MUS  EGD 04/21/2015  Gastric polyp/Gastritis/Repeat to be decided at Office visit in one year (scheduled for 04/20/2016 at 8:30am)/MUS  EGD 05/17/2016  esophageal stenosis (dilated), a few non-bleeding erosions in the middle third of the esophagus (biopsied). Z-line irregular, 35 cm from the incisors. Chromoscopy was performed in the entire esophagus. Destruction of remaining portion of lesion in the middle third of the esophagus with argon plasma coagulation (APC) was performed.  EGD 07/19/2016  benign-appearing esophageal stenosis. There were a few non-bleeding erosions in the middle third of the esophagus. There was nodular heterogeneously stained areas in the esophagus (biopsied) and treated with argon plasma coagulation (APC). Z-line regular, 37 cm from the incisors.  EGD 08/08/2017  COLONOSCOPY 09/26/2017  INVASIVE COLORECTAL ADENOCARCINOMA 1 YR MUS  COLECTOMY PARTIAL ABDOMINAL & TRANSANAL Right 10/17/2017  Rt ascending colectomy with colo-colostomy at [facility]  EGD 05/28/2019  COLONOSCOPY 10/29/2019  COLONOSCOPY 10/29/2019  Negative  EGD  06/10/2020  Esphogeal  dilation  CATARACT EXTRACTION Right 08/16/2021  Dr. Myrna  CATARACT EXTRACTION Left 08/30/2021  Dr. Myrna  BLEPHAROPLASTY 01/07/2022  COLON@[facility]  06/28/2024  HP/InflamPolyp/NoRpt/SMR  Bunionectomy  COLONOSCOPY 04/25/06; 07/22/09; 07/07/14  EGD 03/04/14; 07/08/14  no repeat per mus  HALLUX VALGUS CORRECTION   Past Family History: Family History  Problem Relation Age of Onset  Stroke Mother  Breast cancer Mother  High blood pressure (Hypertension) Mother  Stroke Father  Coronary Artery Disease (Blocked arteries around heart) Father  High blood pressure (Hypertension) Father  High blood pressure (Hypertension) Sister   Medications: Current Outpatient Medications  Medication Sig Dispense Refill  amLODIPine  (NORVASC ) 10 MG tablet Take 1 tablet (10 mg total) by mouth once daily (Patient not taking: Reported on 06/12/2024) 90 tablet 1  atorvastatin  (LIPITOR ) 40 MG tablet Take 1 tablet (40 mg total) by mouth once daily 90 tablet 3  BIOTIN, BULK, MISC Use 10,000 mg once daily  buPROPion  (WELLBUTRIN  SR) 150 MG SR tablet Take 1 tablet (150 mg total) by mouth 2 (two) times daily Please schedule an office visit before anymore refills 60 tablet 0  busPIRone  (BUSPAR ) 7.5 MG tablet  cholecalciferol (VITAMIN D3) 1000 unit capsule Take 1,000 Units by mouth once daily (Patient not taking: Reported on 07/17/2024)  diphenhydramine  HCl (BENADRYL  ORAL) Take by mouth as needed  docusate sodium  (DULCOEASE ORAL) Take 5 mg by mouth once daily Dulcolax  docusate sodium  (STOOL SOFTENER ORAL) Take 3 tablets by mouth at bedtime  ELIQUIS  5 mg tablet Take 1 tablet (5 mg total) by mouth 2 (two) times daily 180 tablet 1  fexofenadine (ALLEGRA ALLERGY) 180 MG tablet Take 1 tablet (180 mg total) by mouth once daily 90 tablet 3  fluticasone  propionate (FLONASE ) 50 mcg/actuation nasal spray Place 2 sprays into both nostrils once daily 48 g 1  FUROsemide  (LASIX ) 20 MG tablet Take 1 tablet (20 mg total) by mouth once daily for 5  days 5 tablet 0  hydroCHLOROthiazide  (HYDRODIURIL ) 12.5 MG tablet Take 1 tablet (12.5 mg total) by mouth once daily 90 tablet 0  mv-mn/om3/dha/epa/fish/lut/zea (OCUVITE ADULT 50 PLUS ORAL) Take 1 tablet by mouth once daily (Patient not taking: Reported on 07/17/2024)  pantoprazole  (PROTONIX ) 40 MG DR tablet Take 1 tablet (40 mg total) by mouth 2 (two) times daily before meals 180 tablet 1  ramipriL  (ALTACE ) 10 MG capsule Take 2 capsules (20 mg total) by mouth once daily 180 capsule 3  sodium, potassium, and magnesium  (SUPREP) oral solution Take 1 Bottle by mouth as directed One kit contains 2 bottles. Take both bottles at the times instructed by your provider. (Patient not taking: Reported on 07/17/2024) 354 mL 0  sotaloL  (BETAPACE ) 80 MG tablet Take 1 tablet (80 mg total) by mouth 2 (two) times daily 180 tablet 1  upadacitinib (RINVOQ ORAL) Take by mouth (Patient not taking: Reported on 07/17/2024)  venlafaxine  (EFFEXOR -XR) 150 MG XR capsule TAKE (1) CAPSULE BY MOUTH EVERY DAY PLEASE SCHEDULE OFFICE VISIT BEFORE ANYMORE REFILLS 30 capsule 1   No current facility-administered medications for this visit.   Allergies: Allergies  Allergen Reactions  Codeine Other (See Comments)  Other Reaction: GI Upset  Latex Other (See Comments)  Other Reaction: CNS Disorder, internal itching, hives Itching  Meperidine Itching, Nausea and Other (See Comments)  Other Reaction: GI Upset, HA  Tramadol Other (See Comments)  Other Reaction: GI Upset  Amoxicillin-Pot Clavulanate Itching and Nausea  Vomit and Migraines  Buprenorphine Hcl Itching, Nausea and Other (See Comments)  headaches  Doxycycline  Hives and Itching  Fentanyl  Itching, Nausea, Vomiting and Headache  Ferrous Fumarate Other (See Comments)  Vomit and Migraines Vomit and Migraines  Morphine  Itching and Nausea  Vomit and Migraines  Nucynta [Tapentadol] Rash  Oxycodone -Acetaminophen  Unknown  Vomit and Migraines  Penicillin Diarrhea   Strawberry Extract Unknown  Tramadol-Acetaminophen  Other (See Comments)  Vomit and Migraines  Tree And Shrub Pollen Other (See Comments)  Hives, Itch, Sewlling    Visit Vitals: Vitals:  07/17/24 1131  BP: (!) 160/82    Review of Systems:  A comprehensive 14 point ROS was performed, reviewed, and the pertinent orthopaedic findings are documented in the HPI.  Physical Exam: Body mass index is 26.16 kg/m. General:  Well developed, well nourished, no apparent distress, normal affect, antalgic gait with a cane  HEENT: Head normocephalic, atraumatic, PERRL.   Abdomen: Soft, non tender, non distended, Bowel sounds present.  Heart: Examination of the heart reveals regular, rate, and rhythm. There is no murmur noted on ascultation. There is a normal apical pulse.  Lungs: Lungs are clear to auscultation. There is no wheeze, rhonchi, or crackles. There is normal expansion of bilateral chest walls.   Left hip exam  SKIN: intact SWELLING: none WARMTH: no warmth TENDERNESS: none, Stinchfield Positive ROM: 0 degrees internal rotation and 30 degrees external rotation and pain with internal rotation localized to the superior anterior thigh,; Hip Flexion 95 STRENGTH: limited by pain GAIT: antalgic and stiff-legged STABILITY: stable to testing CREPITUS: no LEG LENGTH DISCREPANCY: none NEUROLOGICAL EXAM: normal VASCULAR EXAM: normal LUMBAR SPINE: tenderness: Paraspinal tenderness no bony tenderness straight leg raising sign: no motor exam: No focal deficits  The contralateral hip was examined for comparison and it showed: TENDERNESS: none ROM: normal and full STRENGTH: normal STABILITY: stable to testing  Hip Imaging :  I reviewed left hip x-rays from 04/24/2024 images reviewed by myself. There are moderate to severe degenerative changes of the left hip with bone-on-bone articulation femoral head deformity with cam lesion and osteophyte formation. The right hip shows moderate  degenerative changes with joint space narrowing osteophyte formation and sclerosis. No fractures or dislocations noted in either hip.  Assessment:  Left hip osteoarthritis  Plan: Kelly Krause is an 83 year old female with advanced left hip degenerative arthritis. She has complete loss of joint space in the left hip joint with deformity of the femoral head. Her pain is severe and interfering with her quality of life and activities daily living. Risks, benefits, complications of a left anterior total hip arthroplasty have been discussed with the patient.   The hospitalization and post-operative care and rehabilitation were also discussed. The use of perioperative antibiotics and DVT prophylaxis were discussed. The risk, benefits and alternatives to a surgical intervention were discussed at length with the patient. The patient was also advised of risks related to the medical comorbidities and elevated body mass index (BMI). A lengthy discussion took place to review the most common complications including but not limited to: deep vein thrombosis, pulmonary embolus, heart attack, stroke, infection, wound breakdown, heterotopic ossification, dislocation, numbness, leg length in-equality, intraoperative fracture, damage to nerves, tendon,muscles, arteries or other blood vessels, death and other possible complications from anesthesia. The patient was told that we will take steps to minimize these risks by using sterile technique, antibiotics and DVT prophylaxis when appropriate and follow the patient postoperatively in the office setting to monitor progress. The possibility of recurrent pain, no improvement in pain and actual worsening of pain were also discussed with the  patient. The risk of dislocation following total hip replacement was discussed and potential precautions to prevent dislocation were reviewed.   Reviewed using eliquis  postoperatively for DVT prophylaxis. Patient has been off anticoagulants 6 days per  report.

## 2024-07-29 NOTE — Anesthesia Postprocedure Evaluation (Signed)
 Anesthesia Post Note  Patient: Kelly Krause  Procedure(s) Performed: ARTHROPLASTY, HIP, TOTAL, ANTERIOR APPROACH (Left: Hip)  Patient location during evaluation: PACU Anesthesia Type: Spinal Level of consciousness: awake and alert Pain management: pain level controlled Vital Signs Assessment: post-procedure vital signs reviewed and stable Respiratory status: spontaneous breathing and respiratory function stable Cardiovascular status: blood pressure returned to baseline and stable Postop Assessment: no headache, no backache, no apparent nausea or vomiting and adequate PO intake Anesthetic complications: no   No notable events documented.   Last Vitals:  Vitals:   07/29/24 1600 07/29/24 1615  BP: (!) 162/72 (!) 164/72  Pulse: 71 71  Resp: 10 11  Temp:    SpO2: 99% 100%    Last Pain:  Vitals:   07/29/24 1615  TempSrc:   PainSc: 0-No pain    LLE Motor Response: No movement due to regional block (07/29/24 1615)   RLE Motor Response: No movement due to regional block (07/29/24 1615)   L Sensory Level: T12-Inguinal (groin) region (07/29/24 1615) R Sensory Level: T12-Inguinal (groin) region (07/29/24 1615)  Alfonso Ruths

## 2024-07-30 ENCOUNTER — Encounter: Payer: Self-pay | Admitting: Orthopedic Surgery

## 2024-07-30 DIAGNOSIS — M1612 Unilateral primary osteoarthritis, left hip: Secondary | ICD-10-CM | POA: Diagnosis not present

## 2024-07-30 LAB — CBC
HCT: 31.9 % — ABNORMAL LOW (ref 36.0–46.0)
Hemoglobin: 10.4 g/dL — ABNORMAL LOW (ref 12.0–15.0)
MCH: 31.5 pg (ref 26.0–34.0)
MCHC: 32.6 g/dL (ref 30.0–36.0)
MCV: 96.7 fL (ref 80.0–100.0)
Platelets: 220 K/uL (ref 150–400)
RBC: 3.3 MIL/uL — ABNORMAL LOW (ref 3.87–5.11)
RDW: 12.6 % (ref 11.5–15.5)
WBC: 10.8 K/uL — ABNORMAL HIGH (ref 4.0–10.5)
nRBC: 0 % (ref 0.0–0.2)

## 2024-07-30 LAB — BASIC METABOLIC PANEL WITH GFR
Anion gap: 7 (ref 5–15)
BUN: 29 mg/dL — ABNORMAL HIGH (ref 8–23)
CO2: 27 mmol/L (ref 22–32)
Calcium: 8.4 mg/dL — ABNORMAL LOW (ref 8.9–10.3)
Chloride: 102 mmol/L (ref 98–111)
Creatinine, Ser: 0.89 mg/dL (ref 0.44–1.00)
GFR, Estimated: >60 mL/min (ref >60)
Glucose, Bld: 120 mg/dL — ABNORMAL HIGH (ref 70–99)
Potassium: 3.7 mmol/L (ref 3.5–5.1)
Sodium: 136 mmol/L (ref 135–145)

## 2024-07-30 MED ORDER — CEFAZOLIN SODIUM-DEXTROSE 2-4 GM/100ML-% IV SOLN
INTRAVENOUS | Status: AC
Start: 2024-07-30 — End: 2024-07-30
  Filled 2024-07-30: qty 100

## 2024-07-30 NOTE — Progress Notes (Signed)
   Subjective: 1 Day Post-Op Procedure(s) (LRB): ARTHROPLASTY, HIP, TOTAL, ANTERIOR APPROACH (Left) Patient reports pain as moderate.   Patient is well, and has had no acute complaints or problems Denies any CP, SOB, ABD pain. We will continue therapy today.  Plan is to go Skilled nursing facility after hospital stay.  Objective: Vital signs in last 24 hours: Temp:  [97.1 F (36.2 C)-98.3 F (36.8 C)] 97.1 F (36.2 C) (09/09 0456) Pulse Rate:  [66-86] 78 (09/09 0456) Resp:  [10-20] 18 (09/09 0456) BP: (128-171)/(56-83) 136/69 (09/09 0456) SpO2:  [93 %-100 %] 93 % (09/09 0456) Weight:  [73.5 kg] 73.5 kg (09/08 1140)  Intake/Output from previous day: 09/08 0701 - 09/09 0700 In: 831.9 [I.V.:441.7; IV Piggyback:390.2] Out: 1075 [Urine:950; Blood:125] Intake/Output this shift: No intake/output data recorded.  Recent Labs    07/30/24 0624  HGB 10.4*   Recent Labs    07/30/24 0624  WBC 10.8*  RBC 3.30*  HCT 31.9*  PLT 220   Recent Labs    07/30/24 0624  NA 136  K 3.7  CL 102  CO2 27  BUN 29*  CREATININE 0.89  GLUCOSE 120*  CALCIUM  8.4*   No results for input(s): LABPT, INR in the last 72 hours.  EXAM General - Patient is Alert, Appropriate, and Oriented Extremity - Sensation intact distally Intact pulses distally Dorsiflexion/Plantar flexion intact No cellulitis present Compartment soft Dressing - dressing C/D/I and no drainage Motor Function - intact, moving foot and toes well on exam.   Past Medical History:  Diagnosis Date   Allergy    Anticoagulated on apixaban     Atrial fibrillation (HCC)    Colon cancer (HCC)    chemo/rad 15 yrs   DDD (degenerative disc disease), cervical    DM (diabetes mellitus), type 2 (HCC)    controlled no medications   Elevated lipids    Esophageal cancer (HCC)    GERD (gastroesophageal reflux disease)    History of blood transfusion    Hyperlipidemia    Hypertension    Lung cancer (HCC)    chemo   Major  depressive disorder, recurrent episode, moderate (HCC)    Osteoarthritis of left hip    Persistent disorder of initiating or maintaining sleep    Personal history of chemotherapy    Personal history of radiation therapy    colon   Stroke (HCC) 06/03/2021   No Deficits   SVT (supraventricular tachycardia) (HCC)     Assessment/Plan:   1 Day Post-Op Procedure(s) (LRB): ARTHROPLASTY, HIP, TOTAL, ANTERIOR APPROACH (Left) Principal Problem:   S/P total left hip arthroplasty  Estimated body mass index is 25.37 kg/m as calculated from the following:   Height as of this encounter: 5' 7 (1.702 m).   Weight as of this encounter: 73.5 kg. Advance diet Up with therapy Pain well controlled Labs and VSS Encourage Incentive spirometer CM to assist with discharge to SNF  DVT Prophylaxis - TED hose and SCDs Eliquis  Weight-Bearing as tolerated to left leg   T. Medford Amber, PA-C [facility] 07/30/2024, 7:46 AM

## 2024-07-30 NOTE — Progress Notes (Signed)
 Physical Therapy Treatment Patient Details Name: Kelly Krause MRN: [id number] DOB: May 16, 1941 Today's Date: 07/30/2024   History of Present Illness 83 y/o female s/p L THA on 07/29/24. PMH: hx of L SDH and R CVA, colon cancer, Afib, HTN, depression, T2DM    PT Comments  Patient seen for PM session in hopes to progress ambulation. In recliner on arrival and agreeable to PT treatment session. Stood from Medical illustrator with CGA and RW. Ambulated 150' with RW and CGA, mild dizziness but improved from initial attempt in AM. Performed seated and supine exercises focused on LLE strength with good tolerance. Assisted in bed with minA for BLE management. Discharge plan remains appropriate.     If plan is discharge home, recommend the following: A little help with walking and/or transfers;A little help with bathing/dressing/bathroom;Assistance with cooking/housework;Direct supervision/assist for medications management;Assist for transportation;Direct supervision/assist for financial management;Help with stairs or ramp for entrance   Can travel by private vehicle     Yes  Equipment Recommendations  Rolling Izel Eisenhardt (2 wheels);BSC/3in1    Recommendations for Other Services       Precautions / Restrictions Precautions Precautions: Fall Recall of Precautions/Restrictions: Intact Restrictions Weight Bearing Restrictions Per Provider Order: Yes LLE Weight Bearing Per Provider Order: Weight bearing as tolerated     Mobility  Bed Mobility Overal bed mobility: Needs Assistance Bed Mobility: Sit to Supine       Sit to supine: Min assist   General bed mobility comments: in recliner at beginning of session. Returned to bed at end of session with minA for LE management    Transfers Overall transfer level: Needs assistance Equipment used: Rolling Shravan Salahuddin (2 wheels) Transfers: Sit to/from Stand Sit to Stand: Contact guard assist           General transfer comment: initial stand from recliner  required minA, however subsequent required CGA    Ambulation/Gait Ambulation/Gait assistance: Contact guard assist Gait Distance (Feet): 150 Feet Assistive device: Rolling Eugen Jeansonne (2 wheels) Gait Pattern/deviations: Step-through pattern, Decreased stride length, Decreased stance time - left Gait velocity: decreased     General Gait Details: CGA for safety. Mild dizziness reported but improved from first session. Chair follow for safety but did not utilize   Comptroller Bed    Modified Rankin (Stroke Patients Only)       Balance Overall balance assessment: Needs assistance Sitting-balance support: No upper extremity supported, Feet supported Sitting balance-Leahy Scale: Fair     Standing balance support: Bilateral upper extremity supported, Reliant on assistive device for balance Standing balance-Leahy Scale: Fair                              Hotel manager: Impaired Factors Affecting Communication: Hearing impaired  Cognition Arousal: Alert Behavior During Therapy: WFL for tasks assessed/performed   PT - Cognitive impairments: No family/caregiver present to determine baseline                       PT - Cognition Comments: slow processing and delayed responses. A&Ox4. Following commands: Impaired Following commands impaired: Follows one step commands inconsistently, Follows one step commands with increased time    Cueing Cueing Techniques: Verbal cues, Tactile cues  Exercises Total Joint Exercises Heel Slides: AROM, Left, 10 reps, Supine Long Arc Quad: AROM, Left, 10 reps, Seated Marching in Standing:  AROM, Left, 10 reps, Seated    General Comments        Pertinent Vitals/Pain Pain Assessment Pain Assessment: Faces Faces Pain Scale: Hurts little more Pain Location: L hip Pain Descriptors / Indicators: Grimacing Pain Intervention(s): Monitored during session,  Limited activity within patient's tolerance, Repositioned    Home Living                          Prior Function            PT Goals (current goals can now be found in the care plan section) Acute Rehab PT Goals Patient Stated Goal: to go to rehab PT Goal Formulation: With patient Time For Goal Achievement: 08/13/24 Potential to Achieve Goals: Fair Progress towards PT goals: Progressing toward goals    Frequency    BID      PT Plan      Co-evaluation              AM-PAC PT 6 Clicks Mobility   Outcome Measure  Help needed turning from your back to your side while in a flat bed without using bedrails?: A Little Help needed moving from lying on your back to sitting on the side of a flat bed without using bedrails?: A Little Help needed moving to and from a bed to a chair (including a wheelchair)?: A Little Help needed standing up from a chair using your arms (e.g., wheelchair or bedside chair)?: A Little Help needed to walk in hospital room?: A Little Help needed climbing 3-5 steps with a railing? : A Lot 6 Click Score: 17    End of Session   Activity Tolerance: Patient tolerated treatment well Patient left: in bed;with call bell/phone within reach Nurse Communication: Mobility status PT Visit Diagnosis: Muscle weakness (generalized) (M62.81);Unsteadiness on feet (R26.81)     Time: 8570-8551 PT Time Calculation (min) (ACUTE ONLY): 19 min  Charges:    $Therapeutic Activity: 8-22 mins PT General Charges $$ ACUTE PT VISIT: 1 Visit                     Maryanne Finder, PT, DPT Physical Therapist - [facility]  [facility]    Carolynne Schuchard A Tiaja Hagan 07/30/2024, 3:03 PM

## 2024-07-30 NOTE — Plan of Care (Signed)
?  Problem: Education: ?Goal: Knowledge of the prescribed therapeutic regimen will improve ?Outcome: Progressing ?  ?Problem: Activity: ?Goal: Ability to avoid complications of mobility impairment will improve ?Outcome: Progressing ?  ?Problem: Pain Management: ?Goal: Pain level will decrease with appropriate interventions ?Outcome: Progressing ?  ?Problem: Skin Integrity: ?Goal: Will show signs of wound healing ?Outcome: Progressing ?  ?

## 2024-07-30 NOTE — Evaluation (Signed)
 Physical Therapy Evaluation Patient Details Name: Kelly Krause MRN: [id number] DOB: Jun 06, 1941 Today's Date: 07/30/2024  History of Present Illness  83 y/o female s/p L THA on 07/29/24. PMH: hx of L SDH and R CVA, colon cancer, Afib, HTN, depression, T2DM  Clinical Impression  Patient admitted following the above procedure. PTA, patient lives alone and was modI with use of SPC. Husband is in a nursing home after stroke. Patient with no family/friends available consistently. Patient presents with weakness, impaired balance, and decreased activity tolerance. In recliner on arrival, stood from recliner with minA initially and CGA for second attempt during session. Ambulated 10' initially with RW and CGA, however patient reporting dizziness and staring off with delayed responses. Seated rest break. Ambulated additional 20' with RW and CGA with chair follow for safety. Overall mobilizing well but patient unable to verbalize how she's feeling with dizziness. Patient will benefit from skilled PT services during acute stay to address listed deficits. Patient will benefit from ongoing therapy at discharge to maximize functional independence and safety.       If plan is discharge home, recommend the following: A little help with walking and/or transfers;A little help with bathing/dressing/bathroom;Assistance with cooking/housework;Direct supervision/assist for medications management;Assist for transportation;Direct supervision/assist for financial management;Help with stairs or ramp for entrance   Can travel by private vehicle   Yes    Equipment Recommendations Rolling Ilo Beamon (2 wheels);BSC/3in1  Recommendations for Other Services       Functional Status Assessment Patient has had a recent decline in their functional status and demonstrates the ability to make significant improvements in function in a reasonable and predictable amount of time.     Precautions / Restrictions Precautions Precautions:  Fall Recall of Precautions/Restrictions: Intact Restrictions Weight Bearing Restrictions Per Provider Order: Yes LLE Weight Bearing Per Provider Order: Weight bearing as tolerated      Mobility  Bed Mobility               General bed mobility comments: in recliner on arrival    Transfers Overall transfer level: Needs assistance Equipment used: Rolling Jonee Lamore (2 wheels) Transfers: Sit to/from Stand Sit to Stand: Contact guard assist, Min assist           General transfer comment: initial stand from recliner required minA, however subsequent required CGA    Ambulation/Gait Ambulation/Gait assistance: Contact guard assist Gait Distance (Feet): 10 Feet (+20') Assistive device: Rolling Tomeko Scoville (2 wheels) Gait Pattern/deviations: Step-through pattern, Decreased stride length, Decreased stance time - left Gait velocity: decreased     General Gait Details: CGA for safety. On initial ambulation attempt, patient complaining of dizziness and became in a daze with minimal responses. Seated rest break and proceeded to attempt ambulation again with dizziness noted by patient. Deferred further mobility 2/2 patient inability to vocalize dizziness and overall feeling while mobilizing  Stairs            Wheelchair Mobility     Tilt Bed    Modified Rankin (Stroke Patients Only)       Balance Overall balance assessment: Needs assistance Sitting-balance support: No upper extremity supported, Feet supported Sitting balance-Leahy Scale: Fair     Standing balance support: Bilateral upper extremity supported, Reliant on assistive device for balance Standing balance-Leahy Scale: Poor                               Pertinent Vitals/Pain Pain Assessment Pain Assessment: Faces Faces Pain Scale:  Hurts little more Pain Location: L hip Pain Descriptors / Indicators: Grimacing Pain Intervention(s): Monitored during session, Limited activity within patient's  tolerance, Repositioned    Home Living Family/patient expects to be discharged to:: Private residence Living Arrangements: Alone   Type of Home: House Home Access: Stairs to enter   [employer] of Steps: 1   Home Layout: One level Home Equipment: Pharmacist, hospital (2 wheels);Cane - single point      Prior Function Prior Level of Function : Independent/Modified Independent             Mobility Comments: uses SPC at all times       Extremity/Trunk Assessment   Upper Extremity Assessment Upper Extremity Assessment: Generalized weakness    Lower Extremity Assessment Lower Extremity Assessment: Generalized weakness (L LE deficits consistent with post op pain and weakness)    Cervical / Trunk Assessment Cervical / Trunk Assessment: Normal  Communication   Communication Communication: Impaired Factors Affecting Communication: Hearing impaired    Cognition Arousal: Alert Behavior During Therapy: WFL for tasks assessed/performed   PT - Cognitive impairments: No family/caregiver present to determine baseline                       PT - Cognition Comments: slow processing and delayed responses. A&Ox4. Following commands: Impaired Following commands impaired: Follows one step commands inconsistently, Follows one step commands with increased time     Cueing Cueing Techniques: Verbal cues, Tactile cues     General Comments      Exercises     Assessment/Plan    PT Assessment Patient needs continued PT services  PT Problem List Decreased strength;Decreased activity tolerance;Decreased balance;Decreased mobility;Decreased range of motion;Decreased cognition;Decreased knowledge of use of DME;Decreased safety awareness;Decreased knowledge of precautions       PT Treatment Interventions DME instruction;Gait training;Functional mobility training;Therapeutic activities;Therapeutic exercise;Balance training;Neuromuscular  re-education;Patient/family education    PT Goals (Current goals can be found in the Care Plan section)  Acute Rehab PT Goals Patient Stated Goal: to go to rehab PT Goal Formulation: With patient Time For Goal Achievement: 08/13/24 Potential to Achieve Goals: Fair    Frequency BID     Co-evaluation               AM-PAC PT 6 Clicks Mobility  Outcome Measure Help needed turning from your back to your side while in a flat bed without using bedrails?: A Little Help needed moving from lying on your back to sitting on the side of a flat bed without using bedrails?: A Little Help needed moving to and from a bed to a chair (including a wheelchair)?: A Little Help needed standing up from a chair using your arms (e.g., wheelchair or bedside chair)?: A Little Help needed to walk in hospital room?: A Little Help needed climbing 3-5 steps with a railing? : A Lot 6 Click Score: 17    End of Session   Activity Tolerance: Patient tolerated treatment well Patient left: in chair;with call bell/phone within reach Nurse Communication: Mobility status PT Visit Diagnosis: Muscle weakness (generalized) (M62.81);Unsteadiness on feet (R26.81)    Time: 9066-9046 PT Time Calculation (min) (ACUTE ONLY): 20 min   Charges:   PT Evaluation $PT Eval Moderate Complexity: 1 Mod   PT General Charges $$ ACUTE PT VISIT: 1 Visit         Maryanne Finder, PT, DPT Physical Therapist - [facility]  [facility]   Kimber Fritts A Ignace Mandigo 07/30/2024,  10:04 AM

## 2024-07-30 NOTE — Plan of Care (Signed)
  Problem: Activity: Goal: Ability to avoid complications of mobility impairment will improve Outcome: Progressing   Problem: Pain Management: Goal: Pain level will decrease with appropriate interventions Outcome: Progressing   Problem: Activity: Goal: Risk for activity intolerance will decrease Outcome: Progressing   

## 2024-07-31 DIAGNOSIS — M1612 Unilateral primary osteoarthritis, left hip: Secondary | ICD-10-CM | POA: Diagnosis not present

## 2024-07-31 LAB — CBC
HCT: 32.8 % — ABNORMAL LOW (ref 36.0–46.0)
Hemoglobin: 10.9 g/dL — ABNORMAL LOW (ref 12.0–15.0)
MCH: 32.1 pg (ref 26.0–34.0)
MCHC: 33.2 g/dL (ref 30.0–36.0)
MCV: 96.5 fL (ref 80.0–100.0)
Platelets: 216 K/uL (ref 150–400)
RBC: 3.4 MIL/uL — ABNORMAL LOW (ref 3.87–5.11)
RDW: 13 % (ref 11.5–15.5)
WBC: 12.9 K/uL — ABNORMAL HIGH (ref 4.0–10.5)
nRBC: 0 % (ref 0.0–0.2)

## 2024-07-31 MED ORDER — ONDANSETRON HCL 4 MG PO TABS: 4.0000 mg | ORAL_TABLET | Freq: Four times a day (QID) | ORAL | 0 refills | Status: DC | PRN

## 2024-07-31 MED ORDER — DOCUSATE SODIUM 100 MG PO CAPS: 100.0000 mg | ORAL_CAPSULE | Freq: Two times a day (BID) | ORAL | 0 refills | Status: AC

## 2024-07-31 MED ORDER — ACETAMINOPHEN 500 MG PO TABS: 1000.0000 mg | ORAL_TABLET | Freq: Three times a day (TID) | ORAL | Status: AC

## 2024-07-31 MED ORDER — OXYCODONE HCL 5 MG PO TABS: 2.5000 mg | ORAL_TABLET | Freq: Four times a day (QID) | ORAL | 0 refills | Status: DC | PRN

## 2024-07-31 NOTE — Discharge Instructions (Signed)
 Instructions after Anterior Total Hip Replacement        Dr. Zachary Aberman, Jr., M.D.      Dept. of Orthopaedics & Sports Medicine  [facility]  [address]  [place], [place]  [address]  Phone: [phone]   Fax: [phone]    DIET: Drink plenty of non-alcoholic fluids. Resume your normal diet. Include foods high in fiber.  ACTIVITY:  You may use crutches or a walker with weight-bearing as tolerated, unless instructed otherwise. You may be weaned off of the walker or crutches by your Physical Therapist.  Continue doing gentle exercises. Exercising will reduce the pain and swelling, increase motion, and prevent muscle weakness.   Please continue to use the TED compression stockings for 2 weeks. You may remove the stockings at night, but should reapply them in the morning. Do not drive or operate any equipment until instructed.  WOUND CARE:  Continue to use ice packs periodically to reduce pain and swelling. You may shower with honeycomb dressing 3 days after your surgery. Do not submerge incision site under water. Remove honeycomb dressing 7 days after surgery and allow dermabond to fall off on its own.   MEDICATIONS: You may resume your regular medications. Please take the pain medication as prescribed on the medication list. Do not take pain medication on an empty stomach. Pain medications and iron supplements can cause constipation. Use a stool softener (Senokot or Colace) on a daily basis and a laxative (dulcolax or miralax ) as needed. Do not drive or drink alcoholic beverages when taking pain medications.  POSTOPERATIVE CONSTIPATION PROTOCOL Constipation - defined medically as fewer than three stools per week and severe constipation as less than one stool per week.  One of the most common issues patients have following surgery is constipation.  Even if you have a regular bowel pattern at home, your normal regimen is likely to be disrupted due to multiple  reasons following surgery.  Combination of anesthesia, postoperative narcotics, change in appetite and fluid intake all can affect your bowels.  In order to avoid complications following surgery, here are some recommendations in order to help you during your recovery period.  Colace (docusate) - Pick up an over-the-counter form of Colace or another stool softener and take twice a day as long as you are requiring postoperative pain medications.  Take with a full glass of water daily.  If you experience loose stools or diarrhea, hold the colace until you stool forms back up.  If your symptoms do not get better within 1 week or if they get worse, check with your doctor.  Dulcolax (bisacodyl ) - Pick up over-the-counter and take as directed by the product packaging as needed to assist with the movement of your bowels.  Take with a full glass of water.  Use this product as needed if not relieved by Colace only.   MiraLax  (polyethylene glycol) - Pick up over-the-counter to have on hand.  MiraLax  is a solution that will increase the amount of water in your bowels to assist with bowel movements.  Take as directed and can mix with a glass of water, juice, soda, coffee, or tea.  Take if you go more than two days without a movement. Do not use MiraLax  more than once per day. Call your doctor if you are still constipated or irregular after using this medication for 7 days in a row.  If you continue to have problems with postoperative constipation, please contact the office for further assistance and recommendations.  If you experience the worst abdominal pain ever or develop nausea or vomiting, please contact the office immediatly for further recommendations for treatment.   CALL THE OFFICE FOR: Temperature above 101 degrees Excessive bleeding or drainage on the dressing. Excessive swelling, coldness, or paleness of the toes. Persistent nausea and vomiting.  FOLLOW-UP:  You should have an appointment to  return to the office in 2 weeks after surgery. Arrangements have been made for continuation of Physical Therapy (either home therapy or outpatient therapy).

## 2024-07-31 NOTE — Progress Notes (Signed)
   Subjective: 2 Days Post-Op Procedure(s) (LRB): ARTHROPLASTY, HIP, TOTAL, ANTERIOR APPROACH (Left) Patient reports pain as moderate.   Patient is well, and has had no acute complaints or problems Denies any CP, SOB, ABD pain. We will continue therapy today.  Plan is to go Skilled nursing facility after hospital stay.  Objective: Vital signs in last 24 hours: Temp:  [97.7 F (36.5 C)-98.2 F (36.8 C)] 97.7 F (36.5 C) (09/10 0419) Pulse Rate:  [69-81] 74 (09/10 0419) Resp:  [15-17] 16 (09/10 0419) BP: (124-143)/(55-67) 124/67 (09/10 0419) SpO2:  [94 %-97 %] 94 % (09/10 0419)  Intake/Output from previous day: No intake/output data recorded. Intake/Output this shift: No intake/output data recorded.  Recent Labs    07/30/24 0624 07/31/24 0538  HGB 10.4* 10.9*   Recent Labs    07/30/24 0624 07/31/24 0538  WBC 10.8* 12.9*  RBC 3.30* 3.40*  HCT 31.9* 32.8*  PLT 220 216   Recent Labs    07/30/24 0624  NA 136  K 3.7  CL 102  CO2 27  BUN 29*  CREATININE 0.89  GLUCOSE 120*  CALCIUM  8.4*   No results for input(s): LABPT, INR in the last 72 hours.  EXAM General - Patient is Alert, Appropriate, and Oriented Extremity - Sensation intact distally Intact pulses distally Dorsiflexion/Plantar flexion intact No cellulitis present Compartment soft Dressing - dressing C/D/I and no drainage Motor Function - intact, moving foot and toes well on exam.   Past Medical History:  Diagnosis Date   Allergy    Anticoagulated on apixaban     Atrial fibrillation (HCC)    Colon cancer (HCC)    chemo/rad 15 yrs   DDD (degenerative disc disease), cervical    DM (diabetes mellitus), type 2 (HCC)    controlled no medications   Elevated lipids    Esophageal cancer (HCC)    GERD (gastroesophageal reflux disease)    History of blood transfusion    Hyperlipidemia    Hypertension    Lung cancer (HCC)    chemo   Major depressive disorder, recurrent episode, moderate (HCC)     Osteoarthritis of left hip    Persistent disorder of initiating or maintaining sleep    Personal history of chemotherapy    Personal history of radiation therapy    colon   Stroke (HCC) 06/03/2021   No Deficits   SVT (supraventricular tachycardia) (HCC)     Assessment/Plan:   2 Days Post-Op Procedure(s) (LRB): ARTHROPLASTY, HIP, TOTAL, ANTERIOR APPROACH (Left) Principal Problem:   S/P total left hip arthroplasty  Estimated body mass index is 25.37 kg/m as calculated from the following:   Height as of this encounter: 5' 7 (1.702 m).   Weight as of this encounter: 73.5 kg. Advance diet Up with therapy Pain well controlled Labs and VSS Encourage Incentive spirometer CM to assist with discharge to SNF  DVT Prophylaxis - TED hose and SCDs Eliquis  Weight-Bearing as tolerated to left leg   T. Medford Amber, PA-C [facility] 07/31/2024, 8:12 AM

## 2024-07-31 NOTE — Discharge Summary (Addendum)
 Physician Discharge Summary  Patient ID: Kelly Krause MRN: [id number] DOB/AGE: February 23, 1941 83 y.o.  Admit date: 07/29/2024 Discharge date: 08/02/2024  Admission Diagnoses:  Primary osteoarthritis of left hip [M16.12] S/P total left hip arthroplasty [S03.357]   Discharge Diagnoses: Patient Active Problem List   Diagnosis Date Noted   S/P total left hip arthroplasty 07/29/2024   Acute CVA (cerebrovascular accident) (HCC) 06/04/2021   Stroke (HCC) 06/03/2021   Hypertensive urgency 06/03/2021   Sinus bradycardia 06/03/2021   Cancer of cecum (HCC) 10/04/2017   Normocytic anemia 10/04/2017   A-fib (HCC) 06/16/2015   Cancer of colon (HCC) 06/16/2015   DD (diverticular disease) 06/16/2015   Esophageal ulcer 06/16/2015   Hallux abducto valgus 06/16/2015   Hammer toe 06/16/2015   H/O varicella 06/16/2015   HK (hyperkeratosis) 06/16/2015   BP (high blood pressure) 06/16/2015   Neuritis or radiculitis due to rupture of lumbar intervertebral disc 06/16/2015   Cancer of lung (HCC) 06/16/2015   Degenerative arthritis of spine 06/16/2015   SCC (squamous cell carcinoma of esophagus) (HCC) 06/16/2015   Supraventricular tachycardia (HCC) 06/16/2015   Temporary cerebral vascular dysfunction 06/16/2015   Familial multiple lipoprotein-type hyperlipidemia 03/27/2015   Clinical depression 03/27/2015   Acid reflux 03/27/2015   Constipation 03/27/2015   Ulcerative cystitis 03/27/2015   Routine general medical examination at a health care facility 03/27/2015   Allergic rhinitis 03/27/2015   H/O malignant neoplasm 03/27/2015   Contusion of knee and lower leg 03/27/2015   Narrowing of intervertebral disc space 03/27/2015   DDD (degenerative disc disease), cervical 03/27/2015   DDD (degenerative disc disease), lumbar 03/27/2015   H/O: depression 03/27/2015   Can't get food down 03/27/2015   Essential (primary) hypertension 03/27/2015   Cephalalgia 03/27/2015   Discitis of lumbar region  03/27/2015   Hematuria 03/27/2015   H/O: HTN (hypertension) 03/27/2015   Menopause 03/27/2015   Screening for depression 03/27/2015   Sinus infection 03/27/2015   FOM (frequency of micturition) 03/27/2015   Arrhythmia, ventricular 03/27/2015   Encounter for mammogram to establish baseline mammogram 03/27/2015   Atrial tachycardia (HCC) 03/27/2015   Lumbar and sacral osteoarthritis 11/03/2014   Atrial fibrillation (HCC) 09/18/2014   Cancer of esophagus (HCC) 06/25/2014   Malignant neoplasm of esophagus (HCC) 06/25/2014    Past Medical History:  Diagnosis Date   Allergy    Anticoagulated on apixaban     Atrial fibrillation (HCC)    Colon cancer (HCC)    chemo/rad 15 yrs   DDD (degenerative disc disease), cervical    DM (diabetes mellitus), type 2 (HCC)    controlled no medications   Elevated lipids    Esophageal cancer (HCC)    GERD (gastroesophageal reflux disease)    History of blood transfusion    Hyperlipidemia    Hypertension    Lung cancer (HCC)    chemo   Major depressive disorder, recurrent episode, moderate (HCC)    Osteoarthritis of left hip    Persistent disorder of initiating or maintaining sleep    Personal history of chemotherapy    Personal history of radiation therapy    colon   Stroke (HCC) 06/03/2021   No Deficits   SVT (supraventricular tachycardia) (HCC)      Transfusion: none   Consultants (if any):   Discharged Condition: Improved  Hospital Course: Kelly Krause is an 83 y.o. female who was admitted 07/29/2024 with a diagnosis of S/P total left hip arthroplasty and went to the operating room on 07/29/2024 and underwent the above named  procedures.    Surgeries: Procedure(s): ARTHROPLASTY, HIP, TOTAL, ANTERIOR APPROACH on 07/29/2024 Patient tolerated the surgery well. Taken to PACU where she was stabilized and then transferred to the orthopedic floor.  Started on Eliquis , TEDs and SCDs applied bilaterally. Heels elevated on bed. No evidence of  DVT. Negative Homan. Physical therapy started on day #1 for gait training and transfer. OT started day #1 for ADL and assisted devices.  Patient's IV was d/c on day #1. Slow progress with PT. PT recommending discharge to SNF due to no help at home.  On post op day #4 patient was stable and ready for discharge to SNF  Implants: Cup: Trident Tritanium Clusterhole 52/E w/ x2 screws    Liner: Neutral X3 poly 36/E  Stem: Insignia #5 high offset  Head: Biolox ceramic 36 +0   She was given perioperative antibiotics:  Anti-infectives (From admission, onward)    Start     Dose/Rate Route Frequency Ordered Stop   07/29/24 2000  ceFAZolin  (ANCEF ) IVPB 2g/100 mL premix        2 g 200 mL/hr over 30 Minutes Intravenous Every 6 hours 07/29/24 1634 07/30/24 0310   07/29/24 1115  ceFAZolin  (ANCEF ) IVPB 2g/100 mL premix        2 g 200 mL/hr over 30 Minutes Intravenous On call to O.R. 07/29/24 1112 07/29/24 1352     .  She was given sequential compression devices, early ambulation, and ELiquis  for DVT prophylaxis.  She benefited maximally from the hospital stay and there were no complications.    Recent vital signs:  Vitals:   08/02/24 0449 08/02/24 0739  BP: 127/64 129/61  Pulse: 77 70  Resp: 17 16  Temp: 98 F (36.7 C) 98.1 F (36.7 C)  SpO2: 95% 95%    Recent laboratory studies:  Lab Results  Component Value Date   HGB 10.9 (L) 07/31/2024   HGB 10.4 (L) 07/30/2024   HGB 12.6 06/03/2021   Lab Results  Component Value Date   WBC 12.9 (H) 07/31/2024   PLT 216 07/31/2024   Lab Results  Component Value Date   INR 1.1 06/03/2021   Lab Results  Component Value Date   NA 136 07/30/2024   K 3.7 07/30/2024   CL 102 07/30/2024   CO2 27 07/30/2024   BUN 29 (H) 07/30/2024   CREATININE 0.89 07/30/2024   GLUCOSE 120 (H) 07/30/2024    Discharge Medications:   Allergies as of 08/02/2024       Reactions   Amoxicillin-pot Clavulanate Itching, Nausea Only, Other (See Comments)   Has  patient had a PCN reaction causing immediate rash, facial/tongue/throat swelling, SOB or lightheadedness with hypotension: No Has patient had a PCN reaction causing severe rash involving mucus membranes or skin necrosis: No Has patient had a PCN reaction that required hospitalization: No Has patient had a PCN reaction occurring within the last 10 years: Yes If all of the above answers are NO, then may proceed with Cephalosporin use.   Codeine Nausea Only, Other (See Comments)   GI Upset headaches   Demerol [meperidine] Itching, Nausea Only, Other (See Comments)   GI Upset, Headaches   Doxycycline  Hives, Itching   Latex Hives, Itching   Morphine  And Codeine Itching, Nausea Only, Other (See Comments)   Headaches, pt can still take    Oxycodone -acetaminophen  Itching, Nausea Only   Tramadol Other (See Comments)    GI Upset   Buprenorphine Hcl Itching, Nausea Only, Other (See Comments)   Headaches  Fentanyl  Itching, Nausea And Vomiting   Strawberry Extract Nausea And Vomiting, Rash   Tapentadol Rash        Medication List     STOP taking these medications    sotalol  80 MG tablet Commonly known as: BETAPACE        TAKE these medications    acetaminophen  500 MG tablet Commonly known as: TYLENOL  Take 2 tablets (1,000 mg total) by mouth every 8 (eight) hours.   apixaban  5 MG Tabs tablet Commonly known as: ELIQUIS  Take 1 tablet (5 mg total) by mouth 2 (two) times daily.   atorvastatin  40 MG tablet Commonly known as: Lipitor  Take 1 tablet (40 mg total) by mouth daily. What changed: when to take this   BIOTIN PO Take 1 tablet by mouth daily.   buPROPion  150 MG 12 hr tablet Commonly known as: WELLBUTRIN  SR Take 1 tablet (150 mg total) by mouth 2 (two) times daily.   busPIRone  7.5 MG tablet Commonly known as: BUSPAR  Take 7.5 mg by mouth 2 (two) times daily.   diphenhydrAMINE  25 mg capsule Commonly known as: BENADRYL  Take 50-75 mg every 6 (six) hours as needed by  mouth for itching.   docusate sodium  100 MG capsule Commonly known as: COLACE Take 1 capsule (100 mg total) by mouth 2 (two) times daily.   fexofenadine 180 MG tablet Commonly known as: ALLEGRA Take 180 mg by mouth at bedtime.   fluticasone  50 MCG/ACT nasal spray Commonly known as: FLONASE  USE ONE SPRAY(S) IN EACH NOSTRIL ONCE DAILY What changed:  how much to take how to take this when to take this additional instructions   furosemide  20 MG tablet Commonly known as: LASIX  Take 20 mg by mouth every morning.   hydrochlorothiazide  12.5 MG capsule Commonly known as: MICROZIDE  Take 1 capsule (12.5 mg total) by mouth daily. What changed: when to take this   OCUVITE ADULT 50+ PO Take 1 tablet by mouth daily.   ondansetron  4 MG tablet Commonly known as: ZOFRAN  Take 1 tablet (4 mg total) by mouth every 6 (six) hours as needed for nausea.   oxyCODONE  5 MG immediate release tablet Commonly known as: Oxy IR/ROXICODONE  Take 0.5 tablets (2.5 mg total) by mouth every 6 (six) hours as needed for moderate pain (pain score 4-6).   pantoprazole  40 MG tablet Commonly known as: PROTONIX  Take 1 tablet (40 mg total) by mouth 2 (two) times daily.   ramipril  10 MG capsule Commonly known as: ALTACE  TAKE ONE CAPSULE BY MOUTH ONCE DAILY. MUST SCHEDULE APPOINTMENT FOR NOVEMBER What changed:  how much to take how to take this when to take this   venlafaxine  XR 150 MG 24 hr capsule Commonly known as: EFFEXOR -XR Take 150 mg by mouth daily with breakfast.        Diagnostic Studies: DG HIP UNILAT WITH PELVIS 2-3 VIEWS LEFT Result Date: 07/29/2024 CLINICAL DATA:  Left hip replacement EXAM: DG HIP (WITH OR WITHOUT PELVIS) 2-3V LEFT COMPARISON:  None Available. FINDINGS: Changes of left hip replacement. Normal alignment. No visible complicating feature. IMPRESSION: Left hip replacement without visible complicating feature. Electronically Signed   By: Franky Crease M.D.   On: 07/29/2024 18:14    DG C-Arm 1-60 Min-No Report Result Date: 07/29/2024 Fluoroscopy was utilized by the requesting physician.  No radiographic interpretation.   DG C-Arm 1-60 Min-No Report Result Date: 07/29/2024 Fluoroscopy was utilized by the requesting physician.  No radiographic interpretation.    Disposition:      Follow-up Information  Charlene Debby BROCKS, PA-C Follow up in 2 week(s).   Specialties: Orthopedic Surgery, Emergency Medicine Contact information: [address] [place] [address] [phone]                  Signed: Debby BROCKS Charlene 08/02/2024, 8:12 AM

## 2024-07-31 NOTE — Progress Notes (Signed)
 Physical Therapy Treatment Patient Details Name: Kelly Krause MRN: [id number] DOB: [birth year] Today's Date: 07/31/2024   History of Present Illness 83 y/o female s/p L THA on 07/29/24. PMH: hx of L SDH and R CVA, colon cancer, Afib, HTN, depression, T2DM    PT Comments  Pt pleasant and willing to work with PT, but was self limiting due to pain, she had also just gotten back to bed with nursing before PT arrival and she did not want to get back out of bed.  She did participate well with supine LE exercises, initially quite hesitant with most L hip movement but with gentle encouragement she was able to tolerate more range and show better quality of movement with increased reps.  Pt endorses improved hip and headache pain from this morning, but it remains significant and limiting.  Pt will benefit from ongoing PT, continue with POC.    If plan is discharge home, recommend the following: A little help with walking and/or transfers;A little help with bathing/dressing/bathroom;Assistance with cooking/housework;Direct supervision/assist for medications management;Assist for transportation;Direct supervision/assist for financial management;Help with stairs or ramp for entrance   Can travel by private vehicle        Equipment Recommendations  Rolling walker (2 wheels);BSC/3in1    Recommendations for Other Services       Precautions / Restrictions Precautions Precautions: Fall Recall of Precautions/Restrictions: Intact Restrictions LLE Weight Bearing Per Provider Order: Weight bearing as tolerated     Mobility  Bed Mobility               General bed mobility comments: Pt has just gotten back to bed with nursing and reports that due to pain and fatigue does not wish to get up again at this time    Transfers                        Ambulation/Gait                   Stairs             Wheelchair Mobility     Tilt Bed    Modified Rankin (Stroke Patients  Only)       Balance                                            Communication Communication Factors Affecting Communication: Hearing impaired  Cognition Arousal: Alert Behavior During Therapy: WFL for tasks assessed/performed   PT - Cognitive impairments: No apparent impairments                       PT - Cognition Comments: slow processing and delayed responses. A&Ox4. Following commands: Impaired Following commands impaired: Follows one step commands with increased time    Cueing Cueing Techniques: Verbal cues, Tactile cues  Exercises Total Joint Exercises Ankle Circles/Pumps: 10 reps, AROM Quad Sets: Strengthening, 10 reps (popliteal resistance) Short Arc Quad: AROM, 10 reps (does not tolerate light resistance 2/2 pain) Heel Slides: AROM, 10 reps (lightly resisted leg ext) Hip ABduction/ADduction: AROM, Strengthening, 10 reps (resisted ADd) Straight Leg Raises: PROM, 5 reps    General Comments General comments (skin integrity, edema, etc.): Pt reports 5/10 L hip pain at rest that increases significantly with exercises, head ache pain remains limiting as well      Pertinent Vitals/Pain Pain  Assessment Pain Score: 8  Pain Location: L hip and headache Pain Intervention(s): Limited activity within patient's tolerance    Home Living                          Prior Function            PT Goals (current goals can now be found in the care plan section) Progress towards PT goals: Progressing toward goals    Frequency    BID      PT Plan      Co-evaluation              AM-PAC PT 6 Clicks Mobility   Outcome Measure  Help needed turning from your back to your side while in a flat bed without using bedrails?: A Little Help needed moving from lying on your back to sitting on the side of a flat bed without using bedrails?: A Little Help needed moving to and from a bed to a chair (including a wheelchair)?: A  Little Help needed standing up from a chair using your arms (e.g., wheelchair or bedside chair)?: A Little Help needed to walk in hospital room?: A Little Help needed climbing 3-5 steps with a railing? : A Lot 6 Click Score: 17    End of Session   Activity Tolerance: Patient limited by pain Patient left: in bed;with call bell/phone within reach Nurse Communication: Mobility status PT Visit Diagnosis: Muscle weakness (generalized) (M62.81);Unsteadiness on feet (R26.81)     Time: 8451-8395 PT Time Calculation (min) (ACUTE ONLY): 16 min  Charges:    $Therapeutic Exercise: 8-22 mins PT General Charges $$ ACUTE PT VISIT: 1 Visit                     Carmin JONELLE Deed, DPT 07/31/2024, 4:20 PM

## 2024-07-31 NOTE — Plan of Care (Signed)
 Progressing towards discharge

## 2024-07-31 NOTE — Progress Notes (Signed)
 Physical Therapy Treatment Patient Details Name: Kelly Krause MRN: [id number] DOB: 10/15/1941 Today's Date: 07/31/2024   History of Present Illness 83 y/o female s/p L THA on 07/29/24. PMH: hx of L SDH and R CVA, colon cancer, Afib, HTN, depression, T2DM    PT Comments  Pt was sitting in recliner upon arrival. She is alert and O x 4 however endorsing severe head ache and L hip pain. Pt was pre-medicated however endorses pain 8/10 at rest. 9/10 with activity. Pt was able to tolerate standing and ambulating however due to pain, was unable to ambulate as far today. Vcs and tcs throughout ambulation for improved posture and overall gait kinematics. Pt remains far form her baseline abilities. DC recs remain appropriate to maximize her independence and safety with all ADLs. Acute PT will continue to follow per current POC.    If plan is discharge home, recommend the following: A little help with walking and/or transfers;A little help with bathing/dressing/bathroom;Assistance with cooking/housework;Direct supervision/assist for medications management;Assist for transportation;Direct supervision/assist for financial management;Help with stairs or ramp for entrance     Equipment Recommendations  Rolling walker (2 wheels);BSC/3in1       Precautions / Restrictions Precautions Precautions: Fall Recall of Precautions/Restrictions: Intact Restrictions Weight Bearing Restrictions Per Provider Order: Yes LLE Weight Bearing Per Provider Order: Weight bearing as tolerated     Mobility  Bed Mobility Overal bed mobility: Needs Assistance Bed Mobility: Sit to Supine  Sit to supine: Min assist, Mod assist, Used rails General bed mobility comments: required assistance with BLEs progressing into bed form EOB short sitting    Transfers Overall transfer level: Needs assistance Equipment used: Rolling walker (2 wheels) Transfers: Sit to/from Stand Sit to Stand: Contact guard assist, Min assist  General  transfer comment: CGA for sit> stand however min assist with stand > sit    Ambulation/Gait Ambulation/Gait assistance: Contact guard assist Gait Distance (Feet): 45 Feet Assistive device: Rolling walker (2 wheels) Gait Pattern/deviations: Step-to pattern, Step-through pattern, Antalgic Gait velocity: decreased  General Gait Details: Pt unable to tolerate ambulation as far today and cotinues to require vc/tcs for improved gait sequencing. Pt remains far form baseline abilities.    Balance Overall balance assessment: Needs assistance Sitting-balance support: No upper extremity supported, Feet supported Sitting balance-Leahy Scale: Fair     Standing balance support: Bilateral upper extremity supported, Reliant on assistive device for balance Standing balance-Leahy Scale: Fair    Hotel manager: Impaired Factors Affecting Communication: Hearing impaired  Cognition Arousal: Alert Behavior During Therapy: WFL for tasks assessed/performed   PT - Cognitive impairments: No apparent impairments    PT - Cognition Comments: slow processing and delayed responses. A&Ox4. Following commands: Impaired Following commands impaired: Follows one step commands inconsistently, Follows one step commands with increased time    Cueing Cueing Techniques: Verbal cues, Tactile cues     General Comments General comments (skin integrity, edema, etc.): due to pain, pt requested to hold on HEP until PM session      Pertinent Vitals/Pain Pain Assessment Pain Assessment: 0-10 Pain Score: 8  Pain Location: L hip and headache Pain Intervention(s): Limited activity within patient's tolerance, Monitored during session, Premedicated before session, Repositioned, Ice applied     PT Goals (current goals can now be found in the care plan section) Acute Rehab PT Goals Patient Stated Goal: rehab and less pain Progress towards PT goals: Progressing toward goals    Frequency     BID       AM-PAC  PT 6 Clicks Mobility   Outcome Measure  Help needed turning from your back to your side while in a flat bed without using bedrails?: A Little Help needed moving from lying on your back to sitting on the side of a flat bed without using bedrails?: A Little Help needed moving to and from a bed to a chair (including a wheelchair)?: A Little Help needed standing up from a chair using your arms (e.g., wheelchair or bedside chair)?: A Little Help needed to walk in hospital room?: A Little Help needed climbing 3-5 steps with a railing? : A Lot 6 Click Score: 17    End of Session   Activity Tolerance: Patient tolerated treatment well;Patient limited by pain Patient left: in bed;with call bell/phone within reach Nurse Communication: Mobility status PT Visit Diagnosis: Muscle weakness (generalized) (M62.81);Unsteadiness on feet (R26.81)     Time: 8949-8888 PT Time Calculation (min) (ACUTE ONLY): 21 min  Charges:    $Therapeutic Activity: 8-22 mins PT General Charges $$ ACUTE PT VISIT: 1 Visit                     Rankin Essex PTA 07/31/24, 11:49 AM

## 2024-07-31 NOTE — Plan of Care (Signed)
  Problem: Education: Goal: Knowledge of the prescribed therapeutic regimen will improve Outcome: Progressing   Problem: Activity: Goal: Ability to avoid complications of mobility impairment will improve Outcome: Progressing   Problem: Pain Management: Goal: Pain level will decrease with appropriate interventions Outcome: Progressing   Problem: Education: Goal: Knowledge of General Education information will improve Description: Including pain rating scale, medication(s)/side effects and non-pharmacologic comfort measures Outcome: Progressing   Problem: Clinical Measurements: Goal: Ability to maintain clinical measurements within normal limits will improve Outcome: Progressing

## 2024-07-31 NOTE — Care Management Obs Status (Signed)
 MEDICARE OBSERVATION STATUS NOTIFICATION   Patient Details  Name: Kelly Krause MRN: [id number] Date of Birth: 09-22-1941   Medicare Observation Status Notification Given:  Yes    [name] 07/31/2024, 12:52 PM

## 2024-08-01 DIAGNOSIS — M1612 Unilateral primary osteoarthritis, left hip: Secondary | ICD-10-CM | POA: Diagnosis not present

## 2024-08-01 MED ORDER — POLYETHYLENE GLYCOL 3350 17 G PO PACK
17.0000 g | PACK | Freq: Two times a day (BID) | ORAL | Status: DC
  Administered 2024-08-01 – 2024-08-06 (×10): 17 g via ORAL
  Filled 2024-08-01 (×10): qty 1

## 2024-08-01 MED ORDER — MAGNESIUM HYDROXIDE 400 MG/5ML PO SUSP
30.0000 mL | Freq: Once | ORAL | Status: DC
  Filled 2024-08-01: qty 30

## 2024-08-01 NOTE — Progress Notes (Signed)
 Physical Therapy Treatment Patient Details Name: Kelly Krause MRN: [id number] DOB: 01/19/1941 Today's Date: 08/01/2024   History of Present Illness 83 y/o female s/p L THA on 07/29/24. PMH: hx of L SDH and R CVA, colon cancer, Afib, HTN, depression, T2DM    PT Comments  Pt pleasant and willing to work with PT, but ultimately remains self limiting 2/2 pain (neck and L hip) and generally showed guarded/hesitant activity.  With each L LE exercise she needed a few P/AAROM reps and extra break between reps 2/2 pain/hesitancy but ultimately showed better tolerance than yesterday and did manage a more prolonged bout of ambulation than prior few sessions.  Pt will benefit from continued PT to address functional limitations.     If plan is discharge home, recommend the following: A little help with walking and/or transfers;A little help with bathing/dressing/bathroom;Assistance with cooking/housework;Direct supervision/assist for medications management;Assist for transportation;Direct supervision/assist for financial management;Help with stairs or ramp for entrance   Can travel by private vehicle     Yes  Equipment Recommendations  Rolling walker (2 wheels);BSC/3in1    Recommendations for Other Services       Precautions / Restrictions Precautions Precautions: Fall Recall of Precautions/Restrictions: Intact Restrictions LLE Weight Bearing Per Provider Order: Weight bearing as tolerated     Mobility  Bed Mobility               General bed mobility comments: in recliner pre/post session    Transfers Overall transfer level: Needs assistance Equipment used: Rolling walker (2 wheels) Transfers: Sit to/from Stand Sit to Stand: Contact guard assist, Min assist           General transfer comment: struggled to get weight fully forward - light assist to insure forward motion    Ambulation/Gait Ambulation/Gait assistance: Contact guard assist Gait Distance (Feet): 90  Feet Assistive device: Rolling walker (2 wheels)         General Gait Details: Pt continues to display very guarded and hesitant gait - self selected step-to gait, able to occasionally do reciprocal cadence with a lot of cuing but quick to revert back to guarded step-to gait.   Stairs             Wheelchair Mobility     Tilt Bed    Modified Rankin (Stroke Patients Only)       Balance Overall balance assessment: Needs assistance Sitting-balance support: No upper extremity supported, Feet supported Sitting balance-Leahy Scale: Fair Sitting balance - Comments: pain hesistant to sit up today 2/2 neck pain - slumped posture   Standing balance support: Bilateral upper extremity supported, Reliant on assistive device for balance Standing balance-Leahy Scale: Fair Standing balance comment: reliant on walker, quick to fatigue with prolonged standing                            Communication Communication Communication: Impaired Factors Affecting Communication: Hearing impaired  Cognition Arousal: Alert Behavior During Therapy: WFL for tasks assessed/performed   PT - Cognitive impairments: No apparent impairments                       PT - Cognition Comments: slow processing and delayed responses. A&Ox4. Following commands: Impaired Following commands impaired: Follows one step commands with increased time    Cueing Cueing Techniques: Verbal cues, Tactile cues  Exercises Total Joint Exercises Ankle Circles/Pumps: Strengthening, 15 reps Quad Sets: Strengthening, 10 reps Short Arc Quad: AROM, 10  reps Heel Slides: AROM, 10 reps (resisted leg extension) Hip ABduction/ADduction: AROM, Strengthening, 10 reps Long Arc Quad: AROM, Left, 10 reps, Seated Knee Flexion: Strengthening, 10 reps    General Comments        Pertinent Vitals/Pain Pain Assessment Pain Assessment: 0-10 Pain Score: 6  Pain Location: L hip and neck Pain Intervention(s):  Limited activity within patient's tolerance    Home Living                          Prior Function            PT Goals (current goals can now be found in the care plan section) Progress towards PT goals: Progressing toward goals    Frequency    BID      PT Plan      Co-evaluation              AM-PAC PT 6 Clicks Mobility   Outcome Measure  Help needed turning from your back to your side while in a flat bed without using bedrails?: A Little Help needed moving from lying on your back to sitting on the side of a flat bed without using bedrails?: A Little Help needed moving to and from a bed to a chair (including a wheelchair)?: A Little Help needed standing up from a chair using your arms (e.g., wheelchair or bedside chair)?: A Little Help needed to walk in hospital room?: A Little Help needed climbing 3-5 steps with a railing? : A Lot 6 Click Score: 17    End of Session Equipment Utilized During Treatment: Gait belt Activity Tolerance: Patient limited by pain Patient left: in bed;with call bell/phone within reach Nurse Communication: Mobility status PT Visit Diagnosis: Muscle weakness (generalized) (M62.81);Unsteadiness on feet (R26.81)     Time: 9041-8975 PT Time Calculation (min) (ACUTE ONLY): 26 min  Charges:    $Gait Training: 8-22 mins $Therapeutic Exercise: 8-22 mins PT General Charges $$ ACUTE PT VISIT: 1 Visit                     Carmin JONELLE Deed, DPT 08/01/2024, 1:23 PM

## 2024-08-01 NOTE — Plan of Care (Signed)
  Problem: Education: Goal: Knowledge of the prescribed therapeutic regimen will improve Outcome: Progressing   Problem: Activity: Goal: Ability to avoid complications of mobility impairment will improve Outcome: Progressing   Problem: Skin Integrity: Goal: Will show signs of wound healing Outcome: Progressing   Problem: Education: Goal: Knowledge of General Education information will improve Description: Including pain rating scale, medication(s)/side effects and non-pharmacologic comfort measures Outcome: Progressing   Problem: Health Behavior/Discharge Planning: Goal: Ability to manage health-related needs will improve Outcome: Progressing

## 2024-08-01 NOTE — Progress Notes (Signed)
 Transfer Note:  Nurse called report to Delon, Charity fundraiser. Nurse wheeled pt down to room 148 and put her belongings on countertop. Pt and nurse voices no questions or concerns at this time.

## 2024-08-01 NOTE — Progress Notes (Signed)
 Physical Therapy Treatment Patient Details Name: Kelly Krause MRN: [id number] DOB: Jun 26, 1941 Today's Date: 08/01/2024   History of Present Illness 83 y/o female s/p L THA on 07/29/24. PMH: hx of L SDH and R CVA, colon cancer, Afib, HTN, depression, T2DM    PT Comments  Pt overall did well, continues to c/o pain t/o her body during session but never-the-less showed good effort and determination to participate well with PT.  Pt continues to initially show hesitancy with first few reps of most exercises but with gentle encouragement did manage to prove effort and quality of motion with increased reps.  Similarly gait is safe but slow and guarded with limited step-through tolerance. Pt will benefit from ongoing PT, continue with PC.     If plan is discharge home, recommend the following: A little help with walking and/or transfers;A little help with bathing/dressing/bathroom;Assistance with cooking/housework;Direct supervision/assist for medications management;Assist for transportation;Direct supervision/assist for financial management;Help with stairs or ramp for entrance   Can travel by private vehicle     Yes  Equipment Recommendations  Rolling walker (2 wheels);BSC/3in1    Recommendations for Other Services       Precautions / Restrictions Precautions Precautions: Fall Recall of Precautions/Restrictions: Intact Restrictions LLE Weight Bearing Per Provider Order: Weight bearing as tolerated     Mobility  Bed Mobility Overal bed mobility: Needs Assistance Bed Mobility: Supine to Sit     Supine to sit: Min assist     General bed mobility comments: showed good effort and after some extra cuing initiation, did need assist with getting LEs to EOB and to assist with truncal elevation    Transfers Overall transfer level: Needs assistance Equipment used: Rolling walker (2 wheels) Transfers: Sit to/from Stand Sit to Stand: Min assist, From elevated surface           General  transfer comment: unabole to rise from standard height bed with minA, did so from 2 elevated bed    Ambulation/Gait Ambulation/Gait assistance: Contact guard assist Gait Distance (Feet): 45 Feet Assistive device: Rolling walker (2 wheels)         General Gait Details: guarded and hesitant gait - again with self selected step-to gait, minimal tolerance for reciprocal cadence with a lot of cuing   Stairs             Wheelchair Mobility     Tilt Bed    Modified Rankin (Stroke Patients Only)       Balance Overall balance assessment: Needs assistance Sitting-balance support: No upper extremity supported, Feet supported Sitting balance-Leahy Scale: Good Sitting balance - Comments: pain hesistant to sit up today 2/2 neck pain - slumped posture   Standing balance support: Bilateral upper extremity supported, Reliant on assistive device for balance Standing balance-Leahy Scale: Fair Standing balance comment: reliant on walker, quick to fatigue with prolonged standing                            Communication Communication Communication: Impaired Factors Affecting Communication: Hearing impaired  Cognition Arousal: Alert Behavior During Therapy: WFL for tasks assessed/performed   PT - Cognitive impairments: No apparent impairments                       PT - Cognition Comments: slow processing and delayed responses. A&Ox4. Following commands: Impaired Following commands impaired: Follows one step commands with increased time    Cueing Cueing Techniques: Verbal cues, Tactile cues  Exercises Total Joint Exercises Ankle Circles/Pumps: Strengthening, 15 reps Quad Sets: Strengthening, 10 reps Short Arc Quad: AROM, 10 reps Heel Slides: AROM, 10 reps (resisted leg ext) Hip ABduction/ADduction: AROM, Strengthening, 10 reps Long Arc Quad: AROM, Left, 10 reps, Seated Knee Flexion: Strengthening, 10 reps    General Comments        Pertinent  Vitals/Pain Pain Assessment Pain Assessment: 0-10 Pain Score: 5  Pain Location: L hip and neck, back Pain Intervention(s): RN gave pain meds during session    Home Living Family/patient expects to be discharged to:: Private residence Living Arrangements: Alone                      Prior Function            PT Goals (current goals can now be found in the care plan section) Progress towards PT goals: Progressing toward goals    Frequency    BID      PT Plan      Co-evaluation              AM-PAC PT 6 Clicks Mobility   Outcome Measure  Help needed turning from your back to your side while in a flat bed without using bedrails?: A Little Help needed moving from lying on your back to sitting on the side of a flat bed without using bedrails?: A Little Help needed moving to and from a bed to a chair (including a wheelchair)?: A Little Help needed standing up from a chair using your arms (e.g., wheelchair or bedside chair)?: A Little Help needed to walk in hospital room?: A Little Help needed climbing 3-5 steps with a railing? : A Lot 6 Click Score: 17    End of Session Equipment Utilized During Treatment: Gait belt Activity Tolerance: Patient limited by pain Patient left: in bed;with call bell/phone within reach Nurse Communication: Mobility status PT Visit Diagnosis: Muscle weakness (generalized) (M62.81);Unsteadiness on feet (R26.81)     Time: 8444-8374 PT Time Calculation (min) (ACUTE ONLY): 30 min  Charges:    $Gait Training: 8-22 mins $Therapeutic Exercise: 8-22 mins PT General Charges $$ ACUTE PT VISIT: 1 Visit                     Carmin JONELLE Deed, DPT 08/01/2024, 4:56 PM

## 2024-08-01 NOTE — Progress Notes (Signed)
   Subjective: 3 Days Post-Op Procedure(s) (LRB): ARTHROPLASTY, HIP, TOTAL, ANTERIOR APPROACH (Left) Patient reports pain as mild. Improving from yesterday.  Patient is well, and has had no acute complaints or problems Denies any CP, SOB, ABD pain. We will continue therapy today, slow progress  Plan is to go Skilled nursing facility after hospital stay for patient safety.  Objective: Vital signs in last 24 hours: Temp:  [97.8 F (36.6 C)-98.7 F (37.1 C)] 98.6 F (37 C) (09/10 2244) Pulse Rate:  [78-81] 78 (09/10 2244) Resp:  [15-17] 16 (09/10 2244) BP: (130-151)/(54-69) 132/62 (09/10 2244) SpO2:  [94 %-98 %] 98 % (09/10 2244)  Intake/Output from previous day: No intake/output data recorded. Intake/Output this shift: No intake/output data recorded.  Recent Labs    07/30/24 0624 07/31/24 0538  HGB 10.4* 10.9*   Recent Labs    07/30/24 0624 07/31/24 0538  WBC 10.8* 12.9*  RBC 3.30* 3.40*  HCT 31.9* 32.8*  PLT 220 216   Recent Labs    07/30/24 0624  NA 136  K 3.7  CL 102  CO2 27  BUN 29*  CREATININE 0.89  GLUCOSE 120*  CALCIUM  8.4*   No results for input(s): LABPT, INR in the last 72 hours.  EXAM General - Patient is Alert, Appropriate, and Oriented Extremity - Sensation intact distally Intact pulses distally Dorsiflexion/Plantar flexion intact No cellulitis present Compartment soft Dressing - dressing C/D/I and no drainage Motor Function - intact, moving foot and toes well on exam.   Past Medical History:  Diagnosis Date   Allergy    Anticoagulated on apixaban     Atrial fibrillation (HCC)    Colon cancer (HCC)    chemo/rad 15 yrs   DDD (degenerative disc disease), cervical    DM (diabetes mellitus), type 2 (HCC)    controlled no medications   Elevated lipids    Esophageal cancer (HCC)    GERD (gastroesophageal reflux disease)    History of blood transfusion    Hyperlipidemia    Hypertension    Lung cancer (HCC)    chemo   Major  depressive disorder, recurrent episode, moderate (HCC)    Osteoarthritis of left hip    Persistent disorder of initiating or maintaining sleep    Personal history of chemotherapy    Personal history of radiation therapy    colon   Stroke (HCC) 06/03/2021   No Deficits   SVT (supraventricular tachycardia) (HCC)     Assessment/Plan:   3 Days Post-Op Procedure(s) (LRB): ARTHROPLASTY, HIP, TOTAL, ANTERIOR APPROACH (Left) Principal Problem:   S/P total left hip arthroplasty  Estimated body mass index is 25.37 kg/m as calculated from the following:   Height as of this encounter: 5' 7 (1.702 m).   Weight as of this encounter: 73.5 kg. Advance diet Up with therapy, slow progress with PT.  Pain well controlled Vital signs are stable Work on BM CM to assist with discharge to SNF. Patient lives at home alone and is not capable of safely caring for herself.   DVT Prophylaxis - TED hose and SCDs Eliquis  Weight-Bearing as tolerated to left leg   T. Medford Amber, PA-C [facility] 08/01/2024, 7:50 AM

## 2024-08-01 NOTE — NC FL2 (Addendum)
 [place]  MEDICAID FL2 LEVEL OF CARE FORM     IDENTIFICATION  Patient Name: Kelly Krause Birthdate: June 01, 1941 Sex: female Admission Date (Current Location): 07/29/2024  [facility] and [place] Number:  Chiropodist and Address:  [facility], [address], [place], [place] [address]      Provider Number:    Attending Physician Name and Address:  Lorelle Hussar, MD  Relative Name and Phone Number:  [place], Wagoner [facility])  [phone] (Mobile)    Current Level of Care: Hospital Recommended Level of Care: Skilled Nursing Facility Prior Approval Number:    Date Approved/Denied:   PASRR Number: [id number]  Discharge Plan: SNF    Current Diagnoses: Patient Active Problem List   Diagnosis Date Noted   S/P total left hip arthroplasty 07/29/2024   Acute CVA (cerebrovascular accident) (HCC) 06/04/2021   Stroke (HCC) 06/03/2021   Hypertensive urgency 06/03/2021   Sinus bradycardia 06/03/2021   Cancer of cecum (HCC) 10/04/2017   Normocytic anemia 10/04/2017   A-fib (HCC) 06/16/2015   Cancer of colon (HCC) 06/16/2015   DD (diverticular disease) 06/16/2015   Esophageal ulcer 06/16/2015   Hallux abducto valgus 06/16/2015   Hammer toe 06/16/2015   H/O varicella 06/16/2015   HK (hyperkeratosis) 06/16/2015   BP (high blood pressure) 06/16/2015   Neuritis or radiculitis due to rupture of lumbar intervertebral disc 06/16/2015   Cancer of lung (HCC) 06/16/2015   Degenerative arthritis of spine 06/16/2015   SCC (squamous cell carcinoma of esophagus) (HCC) 06/16/2015   Supraventricular tachycardia (HCC) 06/16/2015   Temporary cerebral vascular dysfunction 06/16/2015   Familial multiple lipoprotein-type hyperlipidemia 03/27/2015   Clinical depression 03/27/2015   Acid reflux 03/27/2015   Constipation 03/27/2015   Ulcerative cystitis 03/27/2015   Routine general medical examination at a health care facility 03/27/2015   Allergic rhinitis  03/27/2015   H/O malignant neoplasm 03/27/2015   Contusion of knee and lower leg 03/27/2015   Narrowing of intervertebral disc space 03/27/2015   DDD (degenerative disc disease), cervical 03/27/2015   DDD (degenerative disc disease), lumbar 03/27/2015   H/O: depression 03/27/2015   Can't get food down 03/27/2015   Essential (primary) hypertension 03/27/2015   Cephalalgia 03/27/2015   Discitis of lumbar region 03/27/2015   Hematuria 03/27/2015   H/O: HTN (hypertension) 03/27/2015   Menopause 03/27/2015   Screening for depression 03/27/2015   Sinus infection 03/27/2015   FOM (frequency of micturition) 03/27/2015   Arrhythmia, ventricular 03/27/2015   Encounter for mammogram to establish baseline mammogram 03/27/2015   Atrial tachycardia (HCC) 03/27/2015   Lumbar and sacral osteoarthritis 11/03/2014   Atrial fibrillation (HCC) 09/18/2014   Cancer of esophagus (HCC) 06/25/2014   Malignant neoplasm of esophagus (HCC) 06/25/2014    Orientation RESPIRATION BLADDER Height & Weight     Self, Time, Situation, Place  Normal Continent Weight: 73.5 kg Height:  5' 7 (170.2 cm)  BEHAVIORAL SYMPTOMS/MOOD NEUROLOGICAL BOWEL NUTRITION STATUS      Continent Diet  AMBULATORY STATUS COMMUNICATION OF NEEDS Skin   Limited Assist Verbally  (surgical incision left hip)                       Personal Care Assistance Level of Assistance  Bathing, Feeding, Dressing Bathing Assistance: Limited assistance Feeding assistance: Independent Dressing Assistance: Limited assistance     Functional Limitations Info  Sight, Hearing, Speech Sight Info: Adequate Hearing Info: Adequate Speech Info: Adequate    SPECIAL CARE FACTORS FREQUENCY  PT (By licensed PT),  OT (By licensed OT)     PT Frequency: 5x/week OT Frequency: 5x/week            Contractures Contractures Info: Not present    Additional Factors Info  Code Status, Allergies Code Status Info: Full Allergies Info: Amoxicillin-pot  Clavulanate  Drug  Itching, Nausea Only, Has patient had a PCN reaction causing immediate rash, facial/tongue/throat swelling, SOB or lightheadedness with hypotension: No  Has patient had a PCN reaction causing severe rash involving mucus membranes or skin necrosis: No  Has patient had a PCN reaction that required hospitalization: No  Has patient had a PCN reaction occurring within the last 10 years: Yes  If all of the above answers are NO, then may proceed with Cephalosporin use.  Codeine  Drug Ingredient  Nausea Only, Other , Demerol (Meperidine)  Drug Ingredient  Itching, Nausea Only, Other (See Comments)  Not  Doxycycline   Drug Ingredient  Hives, Itching    Latex  Drug Ingredient  Hives, Itching  Not Specified  03/27/2015  Past Updates  Morphine  And Codeine  Drug Class  Itching, Nausea Only, Other (See Comments)  Not , pt can still take   Oxycodone -acetaminophen   Drug  Itching, Nausea Only   Buprenorphine Hcl  Drug  Itching, Nausea Only,  Fentanyl   Drug Ingredient  Itching, Nausea And Vomiting  Strawberry Extract  Drug Ingredient  Nausea   Tapentadol  Drug Ingredient  Rash   Tramadol  D Not Specified  Intolerance  04/14/2015  Past Updates   GI Upset           Current Medications (08/01/2024):  This is the current hospital active medication list Current Facility-Administered Medications  Medication Dose Route Frequency Provider Last Rate Last Admin   0.9 %  sodium chloride  infusion   Intravenous Continuous Aberman, Zachary, MD 100 mL/hr at 07/29/24 2009 Restarted at 07/29/24 2009   acetaminophen  (TYLENOL ) tablet 325-650 mg  325-650 mg Oral Q6H PRN Aberman, Zachary, MD   650 mg at 07/31/24 0857   apixaban  (ELIQUIS ) tablet 5 mg  5 mg Oral BID Aberman, Zachary, MD   5 mg at 08/01/24 9185   atorvastatin  (LIPITOR ) tablet 40 mg  40 mg Oral QHS Aberman, Zachary, MD   40 mg at 07/31/24 2242   buPROPion  (WELLBUTRIN  SR) 12 hr tablet 150 mg  150 mg Oral BID Aberman, Zachary, MD   150 mg at 08/01/24 0841    busPIRone  (BUSPAR ) tablet 7.5 mg  7.5 mg Oral BID Aberman, Zachary, MD   7.5 mg at 08/01/24 9185   diphenhydrAMINE  (BENADRYL ) 12.5 MG/5ML elixir 12.5-25 mg  12.5-25 mg Oral Q4H PRN Aberman, Zachary, MD   25 mg at 07/29/24 2203   docusate sodium  (COLACE) capsule 100 mg  100 mg Oral BID Aberman, Zachary, MD   100 mg at 08/01/24 9185   furosemide  (LASIX ) tablet 20 mg  20 mg Oral Daily Aberman, Zachary, MD   20 mg at 08/01/24 9185   hydrochlorothiazide  (HYDRODIURIL ) tablet 12.5 mg  12.5 mg Oral Daily Lenon Elsie HERO, RPH   12.5 mg at 08/01/24 9158   magnesium  hydroxide (MILK OF MAGNESIA) suspension 30 mL  30 mL Oral Once Gaines, Thomas C, PA-C       menthol -cetylpyridinium (CEPACOL) lozenge 3 mg  1 lozenge Oral PRN Aberman, Zachary, MD       Or   phenol (CHLORASEPTIC) mouth spray 1 spray  1 spray Mouth/Throat PRN Aberman, Zachary, MD       metoCLOPramide  (REGLAN ) tablet  5-10 mg  5-10 mg Oral Q8H PRN Aberman, Zachary, MD       Or   metoCLOPramide  (REGLAN ) injection 5-10 mg  5-10 mg Intravenous Q8H PRN Aberman, Zachary, MD       morphine  (PF) 4 MG/ML injection 0.52-1 mg  0.52-1 mg Intravenous Q2H PRN Aberman, Zachary, MD       ondansetron  (ZOFRAN ) tablet 4 mg  4 mg Oral Q6H PRN Aberman, Zachary, MD       Or   ondansetron  (ZOFRAN ) injection 4 mg  4 mg Intravenous Q6H PRN Aberman, Zachary, MD   4 mg at 07/31/24 0421   Oral care mouth rinse  15 mL Mouth Rinse PRN Aberman, Zachary, MD       oxyCODONE  (Oxy IR/ROXICODONE ) immediate release tablet 2.5 mg  2.5 mg Oral Q4H PRN Aberman, Zachary, MD   2.5 mg at 07/31/24 1312   oxyCODONE  (Oxy IR/ROXICODONE ) immediate release tablet 5 mg  5 mg Oral Q4H PRN Aberman, Zachary, MD   5 mg at 08/01/24 1147   pantoprazole  (PROTONIX ) EC tablet 40 mg  40 mg Oral Daily Aberman, Zachary, MD   40 mg at 07/31/24 2241   polyethylene glycol (MIRALAX  / GLYCOLAX ) packet 17 g  17 g Oral BID Gaines, Thomas C, PA-C   17 g at 08/01/24 1104   ramipril  (ALTACE ) capsule 10 mg  10 mg  Oral Daily Aberman, Zachary, MD   10 mg at 08/01/24 0840   venlafaxine  XR (EFFEXOR -XR) 24 hr capsule 150 mg  150 mg Oral Q breakfast Aberman, Zachary, MD   150 mg at 08/01/24 0840   Facility-Administered Medications Ordered in Other Encounters  Medication Dose Route Frequency Provider Last Rate Last Admin   0.9 %  sodium chloride  infusion   Intravenous Continuous Lionell Sonny FALCON, NP   Stopped at 07/29/24 1535   heparin  lock flush 100 unit/mL  500 Units Intravenous Once Choksi, Vester, MD       heparin  lock flush 100 unit/mL  500 Units Intravenous Once Corcoran, Melissa C, MD       sodium chloride  0.9 % injection 10 mL  10 mL Intravenous PRN Choksi, Vester, MD   10 mL at 07/20/15 1147   sodium chloride  flush (NS) 0.9 % injection 10 mL  10 mL Intravenous PRN Corcoran, Melissa C, MD   10 mL at 03/22/17 1235     Discharge Medications: Please see discharge summary for a list of discharge medications.  Relevant Imaging Results:  Relevant Lab Results:   Additional Information    Delphine KANDICE Bring, RN

## 2024-08-01 NOTE — TOC Initial Note (Signed)
 Transition of Care [facility]) - Initial/Assessment Note    Patient Details  Name: Kelly Krause MRN: [id number] Date of Birth: 11-Dec-1940  Transition of Care [facility]) CM/SW Contact:    Alvaro Louder, LCSW Phone Number: 08/01/2024, 8:44 AM  Clinical Narrative:  9/10   LCSWA met with patient at the bedside. Patient indicated that she is agreeable to SNF. Patient prefers [facility]. LCSWA informed her of possible hold up due to [facility] being at capacity. LCSWA to start SNF work-up and fax out.   TOC to follow for discharge.          Patient Goals and CMS Choice            Expected Discharge Plan and Services                                              Prior Living Arrangements/Services                       Activities of Daily Living   ADL Screening (condition at time of admission) Independently performs ADLs?: Yes (appropriate for developmental age) Is the patient deaf or have difficulty hearing?: No Does the patient have difficulty seeing, even when wearing glasses/contacts?: No Does the patient have difficulty concentrating, remembering, or making decisions?: No  Permission Sought/Granted                  Emotional Assessment              Admission diagnosis:  Primary osteoarthritis of left hip [M16.12] S/P total left hip arthroplasty [S03.357] Patient Active Problem List   Diagnosis Date Noted   S/P total left hip arthroplasty 07/29/2024   Acute CVA (cerebrovascular accident) (HCC) 06/04/2021   Stroke (HCC) 06/03/2021   Hypertensive urgency 06/03/2021   Sinus bradycardia 06/03/2021   Cancer of cecum (HCC) 10/04/2017   Normocytic anemia 10/04/2017   A-fib (HCC) 06/16/2015   Cancer of colon (HCC) 06/16/2015   DD (diverticular disease) 06/16/2015   Esophageal ulcer 06/16/2015   Hallux abducto valgus 06/16/2015   Hammer toe 06/16/2015   H/O varicella 06/16/2015   HK (hyperkeratosis) 06/16/2015   BP (high blood  pressure) 06/16/2015   Neuritis or radiculitis due to rupture of lumbar intervertebral disc 06/16/2015   Cancer of lung (HCC) 06/16/2015   Degenerative arthritis of spine 06/16/2015   SCC (squamous cell carcinoma of esophagus) (HCC) 06/16/2015   Supraventricular tachycardia (HCC) 06/16/2015   Temporary cerebral vascular dysfunction 06/16/2015   Familial multiple lipoprotein-type hyperlipidemia 03/27/2015   Clinical depression 03/27/2015   Acid reflux 03/27/2015   Constipation 03/27/2015   Ulcerative cystitis 03/27/2015   Routine general medical examination at a health care facility 03/27/2015   Allergic rhinitis 03/27/2015   H/O malignant neoplasm 03/27/2015   Contusion of knee and lower leg 03/27/2015   Narrowing of intervertebral disc space 03/27/2015   DDD (degenerative disc disease), cervical 03/27/2015   DDD (degenerative disc disease), lumbar 03/27/2015   H/O: depression 03/27/2015   Can't get food down 03/27/2015   Essential (primary) hypertension 03/27/2015   Cephalalgia 03/27/2015   Discitis of lumbar region 03/27/2015   Hematuria 03/27/2015   H/O: HTN (hypertension) 03/27/2015   Menopause 03/27/2015   Screening for depression 03/27/2015   Sinus infection 03/27/2015   FOM (frequency of micturition) 03/27/2015   Arrhythmia, ventricular 03/27/2015  Encounter for mammogram to establish baseline mammogram 03/27/2015   Atrial tachycardia (HCC) 03/27/2015   Lumbar and sacral osteoarthritis 11/03/2014   Atrial fibrillation (HCC) 09/18/2014   Cancer of esophagus (HCC) 06/25/2014   Malignant neoplasm of esophagus (HCC) 06/25/2014   PCP:  Bertrum Charlie CROME, MD Pharmacy:   [facility] [address] [place] - [address] [place] [address] Phone: [phone] Fax: [phone]  [facility] [address] [place] - [address] [place] [address] Phone: [phone] Fax: [phone]  [id number] [place] - [address] AT [facility] [address] [facility] [place] [address] Phone: [phone] Fax: [phone]  [facility] - [place], [place] - [address] [place] [address] Phone: [phone] Fax: [phone]     Social Drivers of Health (SDOH) Social History: SDOH Screenings   Food Insecurity: No Food Insecurity (07/29/2024)  Housing: Low Risk  (07/29/2024)  Transportation Needs: No Transportation Needs (07/29/2024)  Utilities: Not At Risk (07/29/2024)  Financial Resource Strain: Low Risk  (06/12/2024)   Received from [facility]  Social Connections: Socially Integrated (07/29/2024)  Tobacco Use: Medium Risk (07/29/2024)  Health Literacy: Low Risk  (02/28/2021)   Received from [facility]   SDOH Interventions:     Readmission Risk Interventions     No data to display

## 2024-08-02 DIAGNOSIS — M1612 Unilateral primary osteoarthritis, left hip: Secondary | ICD-10-CM | POA: Diagnosis not present

## 2024-08-02 MED ORDER — LACTULOSE 10 GM/15ML PO SOLN
30.0000 g | Freq: Once | ORAL | Status: AC
  Administered 2024-08-02: 30 g via ORAL
  Filled 2024-08-02: qty 60

## 2024-08-02 MED ORDER — BISACODYL 10 MG RE SUPP
10.0000 mg | Freq: Once | RECTAL | Status: AC
  Administered 2024-08-02: 10 mg via RECTAL
  Filled 2024-08-02: qty 1

## 2024-08-02 NOTE — Evaluation (Signed)
 Occupational Therapy Evaluation Patient Details Name: Kelly Krause MRN: [id number] DOB: Apr 05, 1941 Today's Date: 08/02/2024   History of Present Illness   83 y/o female s/p L THA on 07/29/24. PMH: hx of L SDH and R CVA, colon cancer, Afib, HTN, depression, T2DM     Clinical Impressions Pt seen for OT evaluation this date, POD#1 from above surgery. Pt was independent in all ADLs prior to surgery, however occasionally using SPC for mobility due to L hip pain and decreased balance. Pt lives alone in a 1 level house with 1 STE, accessible bathroom with walk-in shower with grab bars (no shower seat available), comfort height commode with grab bar, was completing IADLs (has assistance 2x/month with housekeeping and laundry).  Pt is very eager to return to PLOF with less pain and improved safety and independence. Pt currently requires moderate assist for LB dressing while in seated position due to increased pain and limited AROM of L hip. Pt instructed in safe engagement of self care skills, falls prevention strategies, home/routines modifications, DME for LB dressing tasks. Pt would benefit from additional instruction in self care skills and techniques to help maintain precautions with or without assistive devices to support recall and carryover prior to discharge. Anticipate the need for follow up OT services upon acute hospital DC.        If plan is discharge home, recommend the following:   A little help with walking and/or transfers;A little help with bathing/dressing/bathroom;Assistance with cooking/housework     Functional Status Assessment   Patient has had a recent decline in their functional status and demonstrates the ability to make significant improvements in function in a reasonable and predictable amount of time.     Equipment Recommendations   Other (comment) (defer to next venue of care)     Recommendations for Other Services         Precautions/Restrictions    Precautions Precautions: Fall Recall of Precautions/Restrictions: Intact Restrictions Weight Bearing Restrictions Per Provider Order: No LLE Weight Bearing Per Provider Order: Weight bearing as tolerated     Mobility Bed Mobility               General bed mobility comments: Pt seated at recliner at start and end of session    Transfers Overall transfer level: Needs assistance Equipment used: Rolling walker (2 wheels) Transfers: Sit to/from Stand Sit to Stand: Contact guard assist, Min assist           General transfer comment: CGA to Min A for transfers with cuing for hand placement.      Balance Overall balance assessment: Needs assistance Sitting-balance support: No upper extremity supported, Feet supported Sitting balance-Leahy Scale: Good     Standing balance support: Bilateral upper extremity supported, During functional activity, Reliant on assistive device for balance Standing balance-Leahy Scale: Fair                             ADL either performed or assessed with clinical judgement   ADL Overall ADL's : Needs assistance/impaired             Lower Body Bathing: Moderate assistance       Lower Body Dressing: Moderate assistance;Cueing for sequencing;Cueing for safety;Cueing for compensatory techniques;Sit to/from stand Lower Body Dressing Details (indicate cue type and reason): Pain is a limiting factor Toilet Transfer: Minimal assistance;Rolling walker (2 wheels) Toilet Transfer Details (indicate cue type and reason): simulated Toileting- Clothing Manipulation and Hygiene:  Moderate assistance Toileting - Clothing Manipulation Details (indicate cue type and reason): simulated             Vision Patient Visual Report: No change from baseline       Perception         Praxis         Pertinent Vitals/Pain Pain Assessment Pain Assessment: 0-10 Pain Score: 6  Faces Pain Scale: Hurts even more Pain Location: L hip in  sitting when transitioning from sitting with LEs elevated to placing LEs on the floor in sitting Pain Descriptors / Indicators: Grimacing, Spasm Pain Intervention(s): Limited activity within patient's tolerance, Monitored during session, Repositioned     Extremity/Trunk Assessment Upper Extremity Assessment Upper Extremity Assessment: Generalized weakness   Lower Extremity Assessment Lower Extremity Assessment: Defer to PT evaluation       Communication Communication Communication: Impaired Factors Affecting Communication: Hearing impaired   Cognition Arousal: Alert Behavior During Therapy: WFL for tasks assessed/performed                                 Following commands: Impaired Following commands impaired: Follows one step commands with increased time     Cueing  General Comments   Cueing Techniques: Verbal cues;Tactile cues      Exercises Other Exercises Other Exercises: Education on role of OT in Acute, education on fall risk management.   Shoulder Instructions      Home Living Family/patient expects to be discharged to:: Private residence Living Arrangements: Alone   Type of Home: House Home Access: Stairs to enter [employer] of Steps: 1   Home Layout: One level     Bathroom Shower/Tub: Producer, television/film/video: Handicapped height     Home Equipment: Pharmacist, hospital (2 wheels);Cane - single point          Prior Functioning/Environment Prior Level of Function : Independent/Modified Independent             Mobility Comments: uses SPC at all times ADLs Comments: Independent with ADLs/IADLs, has assistance with housekeeping 2x/month and laundry.    OT Problem List: Decreased strength;Decreased safety awareness;Decreased activity tolerance;Impaired balance (sitting and/or standing);Decreased knowledge of use of DME or AE   OT Treatment/Interventions: Self-care/ADL training;Therapeutic  activities;DME and/or AE instruction;Patient/family education;Balance training      OT Goals(Current goals can be found in the care plan section)   Acute Rehab OT Goals Patient Stated Goal: Pt get to the point I can go home and do for myself OT Goal Formulation: With patient Time For Goal Achievement: 08/16/24 Potential to Achieve Goals: Good ADL Goals Pt Will Perform Grooming: with supervision;standing Pt Will Perform Lower Body Dressing: with modified independence;sit to/from stand Pt Will Transfer to Toilet: with modified independence;bedside commode Pt Will Perform Toileting - Clothing Manipulation and hygiene: with modified independence;sit to/from stand   OT Frequency:  Min 2X/week    Co-evaluation              AM-PAC OT 6 Clicks Daily Activity     Outcome Measure Help from another person eating meals?: None Help from another person taking care of personal grooming?: None Help from another person toileting, which includes using toliet, bedpan, or urinal?: A Little Help from another person bathing (including washing, rinsing, drying)?: A Little Help from another person to put on and taking off regular upper body clothing?: None Help from another person to put on  and taking off regular lower body clothing?: A Lot 6 Click Score: 20   End of Session Equipment Utilized During Treatment: Gait belt;Rolling walker (2 wheels) Nurse Communication: Mobility status  Activity Tolerance: Patient tolerated treatment well;Patient limited by pain Patient left: in chair;with call bell/phone within reach;with chair alarm set  OT Visit Diagnosis: Unsteadiness on feet (R26.81);Muscle weakness (generalized) (M62.81)                Time: 8995-8965 OT Time Calculation (min): 30 min Charges:  OT General Charges $OT Visit: 1 Visit OT Evaluation $OT Eval Moderate Complexity: 1 Mod OT Treatments $Self Care/Home Management : 8-22 mins $Therapeutic Activity: 8-22 mins  Harlene Sharps  OTR/L   Harlene LITTIE Sharps 08/02/2024, 12:16 PM

## 2024-08-02 NOTE — Progress Notes (Signed)
   Subjective: 4 Days Post-Op Procedure(s) (LRB): ARTHROPLASTY, HIP, TOTAL, ANTERIOR APPROACH (Left) Patient reports pain as mild to moderate.  Patient is well, and has had no acute complaints or problems Denies any CP, SOB, ABD pain. No N/V. Tolerating po well Has not had a BM We will continue therapy today, slow progress  Plan is to go Skilled nursing facility after hospital stay for patient safety.  Objective: Vital signs in last 24 hours: Temp:  [98 F (36.7 C)-98.2 F (36.8 C)] 98.1 F (36.7 C) (09/12 0739) Pulse Rate:  [70-77] 70 (09/12 0739) Resp:  [15-17] 16 (09/12 0739) BP: (127-136)/(61-78) 129/61 (09/12 0739) SpO2:  [95 %-97 %] 95 % (09/12 0739)  Intake/Output from previous day: No intake/output data recorded. Intake/Output this shift: No intake/output data recorded.  Recent Labs    07/31/24 0538  HGB 10.9*   Recent Labs    07/31/24 0538  WBC 12.9*  RBC 3.40*  HCT 32.8*  PLT 216   No results for input(s): NA, K, CL, CO2, BUN, CREATININE, GLUCOSE, CALCIUM  in the last 72 hours.  No results for input(s): LABPT, INR in the last 72 hours.  EXAM General - Patient is Alert, Appropriate, and Oriented Adbomen - Soft, non-tender, non-distended. Normal bowel sounds. Extremity - Sensation intact distally Intact pulses distally Dorsiflexion/Plantar flexion intact No cellulitis present Compartment soft Dressing - dressing C/D/I and no drainage Motor Function - intact, moving foot and toes well on exam.   Past Medical History:  Diagnosis Date   Allergy    Anticoagulated on apixaban     Atrial fibrillation (HCC)    Colon cancer (HCC)    chemo/rad 15 yrs   DDD (degenerative disc disease), cervical    DM (diabetes mellitus), type 2 (HCC)    controlled no medications   Elevated lipids    Esophageal cancer (HCC)    GERD (gastroesophageal reflux disease)    History of blood transfusion    Hyperlipidemia    Hypertension    Lung cancer  (HCC)    chemo   Major depressive disorder, recurrent episode, moderate (HCC)    Osteoarthritis of left hip    Persistent disorder of initiating or maintaining sleep    Personal history of chemotherapy    Personal history of radiation therapy    colon   Stroke (HCC) 06/03/2021   No Deficits   SVT (supraventricular tachycardia) (HCC)     Assessment/Plan:   4 Days Post-Op Procedure(s) (LRB): ARTHROPLASTY, HIP, TOTAL, ANTERIOR APPROACH (Left) Principal Problem:   S/P total left hip arthroplasty  Estimated body mass index is 25.37 kg/m as calculated from the following:   Height as of this encounter: 5' 7 (1.702 m).   Weight as of this encounter: 73.5 kg. Advance diet Up with therapy, slow progress with PT.  Pain well controlled Vital signs are stable Continue to work on BM CM to assist with discharge to SNF. Patient lives at home alone and is not capable of safely caring for herself.   DVT Prophylaxis - TED hose and SCDs Eliquis  Weight-Bearing as tolerated to left leg   T. Medford Amber, PA-C [facility] 08/02/2024, 8:07 AM

## 2024-08-02 NOTE — Progress Notes (Signed)
 Physical Therapy Treatment Patient Details Name: Kelly Krause MRN: [id number] DOB: 1940-12-26 Today's Date: 08/02/2024   History of Present Illness 83 y/o female s/p L THA on 07/29/24. PMH: hx of L SDH and R CVA, colon cancer, Afib, HTN, depression, T2DM    PT Comments  Pt was long sitting in bed upon arrival. Agreeable to session on 2nd attempt. She presents with slow processing but is oriented x 3. Pt endorses, I need to have a bowl movement so I can go to rehab. Pt did endorse need to urinate. Author assisted pt with OOB mobility, she stood from elevated bed height with CGA however later required min assist to stand from lower/standard height. Pt lives alone and is far form her baseline. She will benefit from continued skilled PT at DC to maximize independence  and safety with all ADLs. DC recs remain appropriate.    If plan is discharge home, recommend the following: A little help with walking and/or transfers;A little help with bathing/dressing/bathroom;Assistance with cooking/housework;Direct supervision/assist for medications management;Assist for transportation;Direct supervision/assist for financial management;Help with stairs or ramp for entrance     Equipment Recommendations  Rolling walker (2 wheels);BSC/3in1       Precautions / Restrictions Precautions Precautions: Fall Recall of Precautions/Restrictions: Intact Restrictions Weight Bearing Restrictions Per Provider Order: No LLE Weight Bearing Per Provider Order: Weight bearing as tolerated     Mobility  Bed Mobility Overal bed mobility: Needs Assistance Bed Mobility: Supine to Sit  Supine to sit: Min assist, Mod assist  General bed mobility comments: increased assistance required to exit bed. vcs and tcs for technique and sequencing improvements    Transfers Overall transfer level: Needs assistance Equipment used: Rolling walker (2 wheels) Transfers: Sit to/from Stand Sit to Stand: Contact guard assist, Min  assist  General transfer comment: CGA from elevated surfaces, min assist from [facility] placed over toilet. pt did require assistance pull down underwear and then later to pull up    Ambulation/Gait Ambulation/Gait assistance: Contact guard assist Gait Distance (Feet): 75 Feet Assistive device: Rolling walker (2 wheels) Gait Pattern/deviations: Step-to pattern, Step-through pattern, Antalgic  General Gait Details: guarded and hesitant gait - again with self selected step-to gait, minimal tolerance for reciprocal cadence with a lot of cuing    Balance Overall balance assessment: Needs assistance Sitting-balance support: No upper extremity supported, Feet supported Sitting balance-Leahy Scale: Good     Standing balance support: Bilateral upper extremity supported, During functional activity, Reliant on assistive device for balance Standing balance-Leahy Scale: Fair       Hotel manager: Impaired Factors Affecting Communication: Hearing impaired  Cognition Arousal: Alert Behavior During Therapy: WFL for tasks assessed/performed   PT - Cognitive impairments: No apparent impairments      PT - Cognition Comments: slow processing and delayed responses. A&Ox3. Following commands: Impaired Following commands impaired: Follows one step commands with increased time    Cueing Cueing Techniques: Verbal cues, Tactile cues         Pertinent Vitals/Pain Pain Assessment Pain Assessment: 0-10 Pain Score: 5  Pain Location: L hip and neck, back Pain Intervention(s): Limited activity within patient's tolerance, Monitored during session, Premedicated before session, Repositioned     PT Goals (current goals can now be found in the care plan section) Acute Rehab PT Goals Patient Stated Goal: rehab and less pain Progress towards PT goals: Progressing toward goals    Frequency    BID       AM-PAC PT 6 Clicks Mobility  Outcome Measure  Help needed turning  from your back to your side while in a flat bed without using bedrails?: A Little Help needed moving from lying on your back to sitting on the side of a flat bed without using bedrails?: A Little Help needed moving to and from a bed to a chair (including a wheelchair)?: A Little Help needed standing up from a chair using your arms (e.g., wheelchair or bedside chair)?: A Little Help needed to walk in hospital room?: A Little Help needed climbing 3-5 steps with a railing? : A Lot 6 Click Score: 17    End of Session   Activity Tolerance: Patient tolerated treatment well;Patient limited by pain;Patient limited by fatigue Patient left: in chair;with call bell/phone within reach;with chair alarm set Nurse Communication: Mobility status PT Visit Diagnosis: Muscle weakness (generalized) (M62.81);Unsteadiness on feet (R26.81)     Time: 9063-9043 PT Time Calculation (min) (ACUTE ONLY): 20 min  Charges:    $Gait Training: 8-22 mins PT General Charges $$ ACUTE PT VISIT: 1 Visit                    Rankin Essex PTA 08/02/24, 10:22 AM

## 2024-08-02 NOTE — Progress Notes (Signed)
 Physical Therapy Treatment Patient Details Name: Kelly Krause MRN: [id number] DOB: 04-May-1941 Today's Date: 08/02/2024   History of Present Illness 83 y/o female s/p L THA on 07/29/24. PMH: hx of L SDH and R CVA, colon cancer, Afib, HTN, depression, T2DM    PT Comments  Pt was long sitting in bed upon arrival. She continues to be unable to state correct day of the week but does have overall awareness of situation and reason for being in hospital. Pt present with slow processing but overall follows commands well. She was able to safely exit bed, stand to RW, and tolerate ambulation ~ 120 ft. Vcs throughout gait training for improved posture, heel strike, and step quality. Pt attempted to have BM on BSC (placed over toilet) but was unsuccessful. Did successfully urinate and then I'ly brushed her teeth at sink. Pt remains far from her baseline abilities. DC recs remain appropriate to maximize her independence and safety with all ADLs.     If plan is discharge home, recommend the following: A little help with walking and/or transfers;A little help with bathing/dressing/bathroom;Assistance with cooking/housework;Direct supervision/assist for medications management;Assist for transportation;Direct supervision/assist for financial management;Help with stairs or ramp for entrance     Equipment Recommendations  Rolling walker (2 wheels);BSC/3in1       Precautions / Restrictions Precautions Precautions: Fall Recall of Precautions/Restrictions: Intact Restrictions Weight Bearing Restrictions Per Provider Order: No LLE Weight Bearing Per Provider Order: Weight bearing as tolerated     Mobility  Bed Mobility Overal bed mobility: Needs Assistance Bed Mobility: Supine to Sit, Sit to Supine  Supine to sit: Min assist Sit to supine: Min assist General bed mobility comments: increased assistance required to exit bed. vcs and tcs for technique and sequencing improvements    Transfers Overall transfer  level: Needs assistance Equipment used: Rolling walker (2 wheels) Transfers: Sit to/from Stand Sit to Stand: Contact guard assist  General transfer comment: CGA-min assist to stand from EOB and form BSC (over toilet)    Ambulation/Gait Ambulation/Gait assistance: Contact guard assist, Min assist Gait Distance (Feet): 120 Feet Assistive device: Rolling walker (2 wheels) Gait Pattern/deviations: Step-through pattern, Antalgic Gait velocity: decreased  General Gait Details: Min assist during turns due to LOB. Pt was able to ambulate ~ 120 ft total with RW. Vcs throughout for posture correction and improved heel strike to toe off     Balance Overall balance assessment: Needs assistance Sitting-balance support: No upper extremity supported, Feet supported Sitting balance-Leahy Scale: Good     Standing balance support: Bilateral upper extremity supported, During functional activity, Reliant on assistive device for balance Standing balance-Leahy Scale: Fair       Hotel manager: No apparent difficulties  Cognition Arousal: Alert Behavior During Therapy: WFL for tasks assessed/performed   PT - Cognitive impairments: Orientation    PT - Cognition Comments: Pt has slow processing and did not recall the day of the week however does consistently follow commands and seems to have good insight/awareness of situation Following commands: Intact Following commands impaired: Follows one step commands with increased time    Cueing Cueing Techniques: Verbal cues, Tactile cues     General Comments General comments (skin integrity, edema, etc.): After ambulation in hallway, pt requested to use BR. Attempted to have BM but unable. did successfully urinate. pt was able to brush her teeth with set up assist only      Pertinent Vitals/Pain Pain Assessment Pain Assessment: 0-10 Pain Score: 5  Pain Location: LLE more in  calf than hip Pain Descriptors / Indicators:  Grimacing, Spasm Pain Intervention(s): Limited activity within patient's tolerance, Monitored during session, Premedicated before session, Repositioned     PT Goals (current goals can now be found in the care plan section) Acute Rehab PT Goals Patient Stated Goal: rehab and less pain Progress towards PT goals: Progressing toward goals    Frequency    BID       AM-PAC PT 6 Clicks Mobility   Outcome Measure  Help needed turning from your back to your side while in a flat bed without using bedrails?: A Little Help needed moving from lying on your back to sitting on the side of a flat bed without using bedrails?: A Little Help needed moving to and from a bed to a chair (including a wheelchair)?: A Little Help needed standing up from a chair using your arms (e.g., wheelchair or bedside chair)?: A Little Help needed to walk in hospital room?: A Little Help needed climbing 3-5 steps with a railing? : A Lot 6 Click Score: 17    End of Session Equipment Utilized During Treatment: Gait belt Activity Tolerance: Patient tolerated treatment well;Patient limited by pain;Patient limited by fatigue Patient left: in bed;with call bell/phone within reach;with bed alarm set Nurse Communication: Mobility status PT Visit Diagnosis: Muscle weakness (generalized) (M62.81);Unsteadiness on feet (R26.81)     Time: 8553-8487 PT Time Calculation (min) (ACUTE ONLY): 26 min  Charges:    $Gait Training: 8-22 mins $Therapeutic Activity: 8-22 mins PT General Charges $$ ACUTE PT VISIT: 1 Visit                    Rankin Essex PTA 08/02/24, 4:37 PM

## 2024-08-03 DIAGNOSIS — M1612 Unilateral primary osteoarthritis, left hip: Secondary | ICD-10-CM | POA: Diagnosis not present

## 2024-08-03 MED ORDER — FLEET ENEMA RE ENEM
1.0000 | ENEMA | Freq: Once | RECTAL | Status: AC
  Administered 2024-08-03: 1 via RECTAL

## 2024-08-03 NOTE — Plan of Care (Signed)
  Problem: Education: Goal: Knowledge of the prescribed therapeutic regimen will improve Outcome: Progressing Goal: Understanding of discharge needs will improve Outcome: Progressing Goal: Individualized Educational Video(s) Outcome: Progressing   Problem: Activity: Goal: Ability to avoid complications of mobility impairment will improve Outcome: Progressing Goal: Ability to tolerate increased activity will improve Outcome: Progressing   Problem: Clinical Measurements: Goal: Postoperative complications will be avoided or minimized Outcome: Progressing   Problem: Pain Management: Goal: Pain level will decrease with appropriate interventions Outcome: Progressing   Problem: Skin Integrity: Goal: Will show signs of wound healing Outcome: Progressing   Problem: Education: Goal: Knowledge of General Education information will improve Description: Including pain rating scale, medication(s)/side effects and non-pharmacologic comfort measures Outcome: Progressing   Problem: Health Behavior/Discharge Planning: Goal: Ability to manage health-related needs will improve Outcome: Progressing   Problem: Clinical Measurements: Goal: Ability to maintain clinical measurements within normal limits will improve Outcome: Progressing Goal: Will remain free from infection Outcome: Progressing Goal: Diagnostic test results will improve Outcome: Progressing Goal: Respiratory complications will improve Outcome: Progressing Goal: Cardiovascular complication will be avoided Outcome: Progressing   Problem: Activity: Goal: Risk for activity intolerance will decrease Outcome: Progressing   Problem: Nutrition: Goal: Adequate nutrition will be maintained Outcome: Progressing   Problem: Coping: Goal: Level of anxiety will decrease Outcome: Progressing   Problem: Elimination: Goal: Will not experience complications related to bowel motility Outcome: Progressing Goal: Will not experience  complications related to urinary retention Outcome: Progressing   Problem: Pain Managment: Goal: General experience of comfort will improve and/or be controlled Outcome: Progressing   Problem: Safety: Goal: Ability to remain free from injury will improve Outcome: Progressing   Problem: Skin Integrity: Goal: Risk for impaired skin integrity will decrease Outcome: Progressing

## 2024-08-03 NOTE — Plan of Care (Signed)
  Problem: Pain Management: Goal: Pain level will decrease with appropriate interventions Outcome: Progressing   Problem: Health Behavior/Discharge Planning: Goal: Ability to manage health-related needs will improve Outcome: Progressing   Problem: Activity: Goal: Risk for activity intolerance will decrease Outcome: Progressing   Problem: Pain Managment: Goal: General experience of comfort will improve and/or be controlled Outcome: Progressing   Problem: Safety: Goal: Ability to remain free from injury will improve Outcome: Progressing

## 2024-08-03 NOTE — Progress Notes (Signed)
 PT Cancellation Note  Patient Details Name: Kelly Krause MRN: [id number] DOB: May 19, 1941   Cancelled Treatment:    Reason Eval/Treat Not Completed: Patient declined, no reason specified (Attempted x3 pt. refused therapy. Attempted multiple times.)  Sherlean Lesches DPT, PT   [name] 08/03/2024, 4:23 PM

## 2024-08-03 NOTE — Progress Notes (Signed)
 Physical Therapy Evaluation Patient Details Name: Kelly Krause MRN: [id number] DOB: 08/16/1941 Today's Date: 08/03/2024  History of Present Illness  83 y/o female s/p L THA on 07/29/24. PMH: hx of L SDH and R CVA, colon cancer, Afib, HTN, depression, T2DM  Clinical Impression  Patient seen for PT session focused on gait training. Patient required CGA for transfers to sit EOB; minA with RW for ambulation. Pt able to walk 200' with RW. Tolerated session well with no signs of exertion or distress. Main limiting factors today were fatigue. Interventions aimed at improving ambulation and activity tolerance. Continued skilled PT recommended to progress toward functional goals and support discharge readiness.       If plan is discharge home, recommend the following: A little help with walking and/or transfers;A little help with bathing/dressing/bathroom;Assistance with cooking/housework;Direct supervision/assist for medications management;Assist for transportation;Direct supervision/assist for financial management;Help with stairs or ramp for entrance   Can travel by private vehicle   Yes    Equipment Recommendations Rolling walker (2 wheels);BSC/3in1  Recommendations for Other Services       Functional Status Assessment       Precautions / Restrictions Precautions Precautions: Fall Recall of Precautions/Restrictions: Intact Restrictions Weight Bearing Restrictions Per Provider Order: No LLE Weight Bearing Per Provider Order: Weight bearing as tolerated      Mobility  Bed Mobility Overal bed mobility: Needs Assistance Bed Mobility: Supine to Sit, Sit to Supine     Supine to sit: Min assist Sit to supine: Min assist   General bed mobility comments: Pt in bed at end of session    Transfers Overall transfer level: Needs assistance Equipment used: Rolling walker (2 wheels) Transfers: Sit to/from Stand Sit to Stand: Contact guard assist           General transfer comment:  CGA-min assist to stand from EOB and form BSC (over toilet)    Ambulation/Gait Ambulation/Gait assistance: Contact guard assist, Min assist Gait Distance (Feet): 200 Feet Assistive device: Rolling walker (2 wheels) Gait Pattern/deviations: Step-through pattern, Antalgic Gait velocity: decreased     General Gait Details: Min assist during turns for safety. Pt was able to ambulate ~ 200 ft total with RW. Pt motivated to walk further but demonstrated mild fatigue  Stairs            Wheelchair Mobility     Tilt Bed    Modified Rankin (Stroke Patients Only)       Balance Overall balance assessment: Needs assistance Sitting-balance support: No upper extremity supported, Feet supported Sitting balance-Leahy Scale: Good     Standing balance support: Bilateral upper extremity supported, During functional activity, Reliant on assistive device for balance Standing balance-Leahy Scale: Fair                               Pertinent Vitals/Pain Pain Assessment Pain Assessment: Faces Faces Pain Scale: Hurts little more Pain Location: LLE more in calf than hip Pain Descriptors / Indicators: Grimacing, Spasm Pain Intervention(s): Repositioned    Home Living Family/patient expects to be discharged to:: Private residence Living Arrangements: Alone   Type of Home: House Home Access: Stairs to enter   Secretary/administrator of Steps: 1   Home Layout: One level Home Equipment: Pharmacist, hospital (2 wheels);Cane - single point      Prior Function Prior Level of Function : Independent/Modified Independent             Mobility Comments:  uses SPC at all times ADLs Comments: Independent with ADLs/IADLs, has assistance with housekeeping 2x/month and laundry.     Extremity/Trunk Assessment                Communication   Communication Communication: No apparent difficulties    Cognition Arousal: Alert Behavior During Therapy: WFL for tasks  assessed/performed   PT - Cognitive impairments: Orientation                       PT - Cognition Comments: Pt has slow processing and did not recall the day of the week however does consistently follow commands and seems to have good insight/awareness of situation Following commands: Intact Following commands impaired: Follows one step commands with increased time     Cueing Cueing Techniques: Verbal cues, Tactile cues     General Comments      Exercises     Assessment/Plan    PT Assessment    PT Problem List         PT Treatment Interventions      PT Goals (Current goals can be found in the Care Plan section)  Acute Rehab PT Goals Patient Stated Goal: rehab and less pain Time For Goal Achievement: 08/13/24 Potential to Achieve Goals: Fair    Frequency BID     Co-evaluation               AM-PAC PT 6 Clicks Mobility  Outcome Measure Help needed turning from your back to your side while in a flat bed without using bedrails?: A Little Help needed moving from lying on your back to sitting on the side of a flat bed without using bedrails?: A Little Help needed moving to and from a bed to a chair (including a wheelchair)?: A Little Help needed standing up from a chair using your arms (e.g., wheelchair or bedside chair)?: A Little Help needed to walk in hospital room?: A Little Help needed climbing 3-5 steps with a railing? : A Lot 6 Click Score: 17    End of Session Equipment Utilized During Treatment: Gait belt Activity Tolerance: Patient tolerated treatment well;Patient limited by pain;Patient limited by fatigue Patient left: in bed;with call bell/phone within reach;with bed alarm set Nurse Communication: Mobility status PT Visit Diagnosis: Muscle weakness (generalized) (M62.81);Unsteadiness on feet (R26.81)    Time: 8659-8642 PT Time Calculation (min) (ACUTE ONLY): 17 min   Charges:     PT Treatments $Therapeutic Activity: 8-22 mins PT  General Charges $$ ACUTE PT VISIT: 1 Visit         Kelly Krause DPT, PT    Kelly Krause 08/03/2024, 2:13 PM

## 2024-08-03 NOTE — Progress Notes (Signed)
 Subjective: 5 Days Post-Op Procedure(s) (LRB): ARTHROPLASTY, HIP, TOTAL, ANTERIOR APPROACH (Left) Patient reports pain as moderate in the left hip this morning. Patient is well, and has had no acute complaints or problems Denies any CP, SOB, ABD pain. No N/V. Tolerating po well Has not had a BM.  Reports she is still passing gas. We will continue therapy today, slow progress  Plan is to go Skilled nursing facility after hospital stay for patient safety.  Objective: Vital signs in last 24 hours: Temp:  [98.1 F (36.7 C)-99.6 F (37.6 C)] 99.1 F (37.3 C) (09/13 0336) Pulse Rate:  [70-83] 78 (09/13 0336) Resp:  [16-17] 17 (09/13 0336) BP: (125-143)/(57-72) 135/65 (09/13 0336) SpO2:  [93 %-99 %] 93 % (09/13 0336)  Intake/Output from previous day: 09/12 0701 - 09/13 0700 In: 240 [P.O.:240] Out: -  Intake/Output this shift: No intake/output data recorded.  No results for input(s): HGB in the last 72 hours.  No results for input(s): WBC, RBC, HCT, PLT in the last 72 hours.  No results for input(s): NA, K, CL, CO2, BUN, CREATININE, GLUCOSE, CALCIUM  in the last 72 hours.  No results for input(s): LABPT, INR in the last 72 hours.  EXAM General - Patient is Alert, Appropriate, and Oriented Adbomen - Soft, non-tender, non-distended. Normal bowel sounds. Extremity - Sensation intact distally Intact pulses distally Dorsiflexion/Plantar flexion intact No cellulitis present Compartment soft Dressing - dressing C/D/I and no drainage Motor Function - intact, moving foot and toes well on exam.   Past Medical History:  Diagnosis Date   Allergy    Anticoagulated on apixaban     Atrial fibrillation (HCC)    Colon cancer (HCC)    chemo/rad 15 yrs   DDD (degenerative disc disease), cervical    DM (diabetes mellitus), type 2 (HCC)    controlled no medications   Elevated lipids    Esophageal cancer (HCC)    GERD (gastroesophageal reflux disease)     History of blood transfusion    Hyperlipidemia    Hypertension    Lung cancer (HCC)    chemo   Major depressive disorder, recurrent episode, moderate (HCC)    Osteoarthritis of left hip    Persistent disorder of initiating or maintaining sleep    Personal history of chemotherapy    Personal history of radiation therapy    colon   Stroke (HCC) 06/03/2021   No Deficits   SVT (supraventricular tachycardia) (HCC)     Assessment/Plan:   5 Days Post-Op Procedure(s) (LRB): ARTHROPLASTY, HIP, TOTAL, ANTERIOR APPROACH (Left) Principal Problem:   S/P total left hip arthroplasty  Estimated body mass index is 25.37 kg/m as calculated from the following:   Height as of this encounter: 5' 7 (1.702 m).   Weight as of this encounter: 73.5 kg. Advance diet Up with therapy, slow progress with PT.  Pain well controlled Vital signs are stable Continue to work on BM, move on to FLEET enema today. CM to assist with discharge to SNF. Patient lives at home alone and is not capable of safely caring for herself.   DVT Prophylaxis - TED hose and SCDs Eliquis  Weight-Bearing as tolerated to left leg  J. Gustavo Level, PA-C [facility] 08/03/2024, 6:53 AM

## 2024-08-04 DIAGNOSIS — M1612 Unilateral primary osteoarthritis, left hip: Secondary | ICD-10-CM | POA: Diagnosis not present

## 2024-08-04 MED ORDER — CYCLOBENZAPRINE HCL 10 MG PO TABS
5.0000 mg | ORAL_TABLET | Freq: Three times a day (TID) | ORAL | Status: DC | PRN
  Administered 2024-08-04 – 2024-08-05 (×3): 5 mg via ORAL
  Filled 2024-08-04 (×3): qty 1

## 2024-08-04 NOTE — Progress Notes (Signed)
 Subjective: 6 Days Post-Op Procedure(s) (LRB): ARTHROPLASTY, HIP, TOTAL, ANTERIOR APPROACH (Left) Patient reports pain as moderate in the left hip this morning. Walked over 200 feet with therapy yesterday. Patient is well, and has had no acute complaints or problems Denies any CP, SOB, ABD pain. No N/V. Tolerating po well Has had a BM yesterday. We will continue therapy today, slow progress  Plan is to go Skilled nursing facility after hospital stay for patient safety.  Objective: Vital signs in last 24 hours: Temp:  [97.7 F (36.5 C)-98.7 F (37.1 C)] 98.5 F (36.9 C) (09/14 0724) Pulse Rate:  [65-85] 70 (09/14 0724) Resp:  [16-18] 16 (09/14 0724) BP: (117-135)/(55-71) 122/55 (09/14 0724) SpO2:  [92 %-97 %] 93 % (09/14 0724)  Intake/Output from previous day: 09/13 0701 - 09/14 0700 In: 480 [P.O.:480] Out: -  Intake/Output this shift: No intake/output data recorded.  No results for input(s): HGB in the last 72 hours.  No results for input(s): WBC, RBC, HCT, PLT in the last 72 hours.  No results for input(s): NA, K, CL, CO2, BUN, CREATININE, GLUCOSE, CALCIUM  in the last 72 hours.  No results for input(s): LABPT, INR in the last 72 hours.  EXAM General - Patient is Alert, Appropriate, and Oriented Adbomen - Soft, non-tender, non-distended. Normal bowel sounds. Extremity - Sensation intact distally Intact pulses distally Dorsiflexion/Plantar flexion intact No cellulitis present Compartment soft Dressing - dressing C/D/I and no drainage Motor Function - intact, moving foot and toes well on exam.   Past Medical History:  Diagnosis Date   Allergy    Anticoagulated on apixaban     Atrial fibrillation (HCC)    Colon cancer (HCC)    chemo/rad 15 yrs   DDD (degenerative disc disease), cervical    DM (diabetes mellitus), type 2 (HCC)    controlled no medications   Elevated lipids    Esophageal cancer (HCC)    GERD (gastroesophageal reflux  disease)    History of blood transfusion    Hyperlipidemia    Hypertension    Lung cancer (HCC)    chemo   Major depressive disorder, recurrent episode, moderate (HCC)    Osteoarthritis of left hip    Persistent disorder of initiating or maintaining sleep    Personal history of chemotherapy    Personal history of radiation therapy    colon   Stroke (HCC) 06/03/2021   No Deficits   SVT (supraventricular tachycardia) (HCC)     Assessment/Plan:   6 Days Post-Op Procedure(s) (LRB): ARTHROPLASTY, HIP, TOTAL, ANTERIOR APPROACH (Left) Principal Problem:   S/P total left hip arthroplasty  Estimated body mass index is 25.37 kg/m as calculated from the following:   Height as of this encounter: 5' 7 (1.702 m).   Weight as of this encounter: 73.5 kg. Advance diet Up with therapy, slow progress with PT.  Pain well controlled Vital signs are stable She has had a BM. CM to assist with discharge to SNF. Patient lives at home alone and is not capable of safely caring for herself.   DVT Prophylaxis - TED hose and SCDs Eliquis  Weight-Bearing as tolerated to left leg  J. Gustavo Level, PA-C [facility] 08/04/2024, 8:28 AM

## 2024-08-04 NOTE — Plan of Care (Signed)
  Problem: Education: Goal: Knowledge of the prescribed therapeutic regimen will improve Outcome: Progressing Goal: Understanding of discharge needs will improve Outcome: Progressing   Problem: Activity: Goal: Ability to avoid complications of mobility impairment will improve Outcome: Progressing Goal: Ability to tolerate increased activity will improve Outcome: Progressing   Problem: Pain Management: Goal: Pain level will decrease with appropriate interventions Outcome: Progressing   Problem: Education: Goal: Knowledge of General Education information will improve Description: Including pain rating scale, medication(s)/side effects and non-pharmacologic comfort measures Outcome: Progressing   Problem: Health Behavior/Discharge Planning: Goal: Ability to manage health-related needs will improve Outcome: Progressing   Problem: Activity: Goal: Risk for activity intolerance will decrease Outcome: Progressing   Problem: Nutrition: Goal: Adequate nutrition will be maintained Outcome: Progressing   Problem: Coping: Goal: Level of anxiety will decrease Outcome: Progressing   Problem: Elimination: Goal: Will not experience complications related to bowel motility Outcome: Progressing Goal: Will not experience complications related to urinary retention Outcome: Progressing   Problem: Pain Managment: Goal: General experience of comfort will improve and/or be controlled Outcome: Progressing   Problem: Safety: Goal: Ability to remain Emiliana Blaize from injury will improve Outcome: Progressing   Problem: Skin Integrity: Goal: Risk for impaired skin integrity will decrease Outcome: Progressing

## 2024-08-04 NOTE — Progress Notes (Signed)
 Physical Therapy Treatment Patient Details Name: Kelly Krause MRN: [id number] DOB: 05/31/1941 Today's Date: 08/04/2024   History of Present Illness 83 y/o female s/p L THA on 07/29/24. PMH: hx of L SDH and R CVA, colon cancer, Afib, HTN, depression, T2DM    PT Comments  Patient seen for PT session focused on gait training  and strengthening exercises. Patient required minA to CGA for ambulation and used RW . Tolerated session well with no signs of exertion or distress.Pt did report having 5/10 pain which is increased compared to yesterday's treatment session. Main limiting factors today were pain in L hip. Interventions aimed at improving functional activity tolerance. Continued skilled PT recommended to progress toward functional goals and support discharge readiness.     If plan is discharge home, recommend the following: A little help with walking and/or transfers;A little help with bathing/dressing/bathroom;Assistance with cooking/housework;Direct supervision/assist for medications management;Assist for transportation;Direct supervision/assist for financial management;Help with stairs or ramp for entrance   Can travel by private vehicle     Yes  Equipment Recommendations  Rolling walker (2 wheels);BSC/3in1    Recommendations for Other Services       Precautions / Restrictions Precautions Precautions: Fall Recall of Precautions/Restrictions: Intact Restrictions Weight Bearing Restrictions Per Provider Order: No LLE Weight Bearing Per Provider Order: Weight bearing as tolerated     Mobility  Bed Mobility Overal bed mobility: Needs Assistance Bed Mobility: Supine to Sit, Sit to Supine     Supine to sit: Min assist Sit to supine: Min assist   General bed mobility comments: Pt in bed at end of session    Transfers Overall transfer level: Needs assistance Equipment used: Rolling walker (2 wheels) Transfers: Sit to/from Stand Sit to Stand: Min assist           General  transfer comment: CGA-min assist to stand from EOB and form BSC (over toilet)    Ambulation/Gait Ambulation/Gait assistance: Contact guard assist, Min assist Gait Distance (Feet): 175 Feet Assistive device: Rolling walker (2 wheels) Gait Pattern/deviations: Step-through pattern, Antalgic Gait velocity: decreased     General Gait Details: Min assist during turns for safety. Pt was able to ambulate ~ 134ft total with RW. Pt reports increased pain compared to yesterdays visit   Stairs             Wheelchair Mobility     Tilt Bed    Modified Rankin (Stroke Patients Only)       Balance Overall balance assessment: Needs assistance Sitting-balance support: No upper extremity supported, Feet supported Sitting balance-Leahy Scale: Good     Standing balance support: Bilateral upper extremity supported, During functional activity, Reliant on assistive device for balance Standing balance-Leahy Scale: Fair                              Hotel manager: No apparent difficulties  Cognition Arousal: Alert Behavior During Therapy: WFL for tasks assessed/performed   PT - Cognitive impairments: Orientation                       PT - Cognition Comments: Pt has slow processing and did not recall the day of the week however does consistently follow commands and seems to have good insight/awareness of situation Following commands: Intact Following commands impaired: Follows one step commands with increased time    Cueing Cueing Techniques: Verbal cues, Tactile cues  Exercises Total Joint Exercises Quad Sets: Strengthening,  10 reps Heel Slides: AROM, 10 reps Hip ABduction/ADduction: AROM, Strengthening, 10 reps Long Arc Quad: AROM, Left, 10 reps, Seated Other Exercises Other Exercises: Education on importance of continued hip movement    General Comments        Pertinent Vitals/Pain Pain Assessment Pain Score: 5  Pain  Location: LLE more in calf than hip Pain Descriptors / Indicators: Grimacing, Spasm Pain Intervention(s): Repositioned, Monitored during session, Limited activity within patient's tolerance    Home Living                          Prior Function            PT Goals (current goals can now be found in the care plan section) Acute Rehab PT Goals Patient Stated Goal: rehab and less pain Time For Goal Achievement: 08/13/24 Potential to Achieve Goals: Fair    Frequency    BID      PT Plan      Co-evaluation              AM-PAC PT 6 Clicks Mobility   Outcome Measure  Help needed turning from your back to your side while in a flat bed without using bedrails?: A Little Help needed moving from lying on your back to sitting on the side of a flat bed without using bedrails?: A Little Help needed moving to and from a bed to a chair (including a wheelchair)?: A Little Help needed standing up from a chair using your arms (e.g., wheelchair or bedside chair)?: A Little Help needed to walk in hospital room?: A Little Help needed climbing 3-5 steps with a railing? : A Lot 6 Click Score: 17    End of Session Equipment Utilized During Treatment: Gait belt Activity Tolerance: Patient tolerated treatment well;Patient limited by pain;Patient limited by fatigue Patient left: in bed;with call bell/phone within reach;with bed alarm set Nurse Communication: Mobility status PT Visit Diagnosis: Muscle weakness (generalized) (M62.81);Unsteadiness on feet (R26.81)     Time: 8982-8966 PT Time Calculation (min) (ACUTE ONLY): 16 min  Charges:    $Therapeutic Activity: 8-22 mins PT General Charges $$ ACUTE PT VISIT: 1 Visit                     Kelly Krause DPT, PT     Kelly Krause 08/04/2024, 10:42 AM

## 2024-08-05 DIAGNOSIS — M1612 Unilateral primary osteoarthritis, left hip: Secondary | ICD-10-CM | POA: Diagnosis not present

## 2024-08-05 NOTE — Progress Notes (Signed)
   Subjective: 7 Days Post-Op Procedure(s) (LRB): ARTHROPLASTY, HIP, TOTAL, ANTERIOR APPROACH (Left) Patient reports pain as mild  Patient is well, and has had no acute complaints or problems Denies any CP, SOB, ABD pain. No N/V. Tolerating po well + BM We will continue therapy today, slow progress  Plan is to go Skilled nursing facility after hospital stay for patient safety.  Objective: Vital signs in last 24 hours: Temp:  [98.3 F (36.8 C)-99.1 F (37.3 C)] 98.3 F (36.8 C) (09/15 0742) Pulse Rate:  [67-76] 68 (09/15 0742) Resp:  [14-17] 17 (09/15 0742) BP: (118-132)/(53-61) 131/53 (09/15 0742) SpO2:  [93 %-96 %] 93 % (09/15 0742)  Intake/Output from previous day: 09/14 0701 - 09/15 0700 In: 480 [P.O.:480] Out: 189 [Urine:189] Intake/Output this shift: Total I/O In: 250 [Other:250] Out: -   No results for input(s): HGB in the last 72 hours.  No results for input(s): WBC, RBC, HCT, PLT in the last 72 hours.  No results for input(s): NA, K, CL, CO2, BUN, CREATININE, GLUCOSE, CALCIUM  in the last 72 hours.  No results for input(s): LABPT, INR in the last 72 hours.  EXAM General - Patient is Alert, Appropriate, and Oriented Adbomen - Soft, non-tender, non-distended.  Extremity - Sensation intact distally Intact pulses distally Dorsiflexion/Plantar flexion intact No cellulitis present Compartment soft Dressing - dressing C/D/I and no drainage Motor Function - intact, moving foot and toes well on exam.   Past Medical History:  Diagnosis Date   Allergy    Anticoagulated on apixaban     Atrial fibrillation (HCC)    Colon cancer (HCC)    chemo/rad 15 yrs   DDD (degenerative disc disease), cervical    DM (diabetes mellitus), type 2 (HCC)    controlled no medications   Elevated lipids    Esophageal cancer (HCC)    GERD (gastroesophageal reflux disease)    History of blood transfusion    Hyperlipidemia    Hypertension    Lung cancer  (HCC)    chemo   Major depressive disorder, recurrent episode, moderate (HCC)    Osteoarthritis of left hip    Persistent disorder of initiating or maintaining sleep    Personal history of chemotherapy    Personal history of radiation therapy    colon   Stroke (HCC) 06/03/2021   No Deficits   SVT (supraventricular tachycardia) (HCC)     Assessment/Plan:   7 Days Post-Op Procedure(s) (LRB): ARTHROPLASTY, HIP, TOTAL, ANTERIOR APPROACH (Left) Principal Problem:   S/P total left hip arthroplasty  Estimated body mass index is 25.37 kg/m as calculated from the following:   Height as of this encounter: 5' 7 (1.702 m).   Weight as of this encounter: 73.5 kg. Advance diet Up with therapy, slow progress with PT.  Pain well controlled Vital signs are stable CM to assist with discharge to SNF. Patient lives at home alone and is not capable of safely caring for herself. CM states bed at [facility] will be available tomorrow  DVT Prophylaxis - TED hose and SCDs Eliquis  Weight-Bearing as tolerated to left leg   T. Medford Amber, PA-C [facility] 08/05/2024, 9:45 AM

## 2024-08-05 NOTE — Plan of Care (Signed)
 Pt continues to c/o moderate and severe pain in the left hip. PRN pain medication given as ordered. At one point patient was moaning in pain stating I hip is killing me. Pt asked if there was anything that would help. PRN IV pain medication given X1 as patient had oxy approx 3 hrs prior. Pt has been in lower end of the moderate pain since this.   Problem: Education: Goal: Knowledge of the prescribed therapeutic regimen will improve Outcome: Progressing Goal: Understanding of discharge needs will improve Outcome: Progressing Goal: Individualized Educational Video(s) Outcome: Progressing   Problem: Activity: Goal: Ability to avoid complications of mobility impairment will improve Outcome: Progressing Goal: Ability to tolerate increased activity will improve Outcome: Progressing   Problem: Clinical Measurements: Goal: Postoperative complications will be avoided or minimized Outcome: Progressing   Problem: Pain Management: Goal: Pain level will decrease with appropriate interventions Outcome: Progressing   Problem: Skin Integrity: Goal: Will show signs of wound healing Outcome: Progressing   Problem: Education: Goal: Knowledge of General Education information will improve Description: Including pain rating scale, medication(s)/side effects and non-pharmacologic comfort measures Outcome: Progressing   Problem: Health Behavior/Discharge Planning: Goal: Ability to manage health-related needs will improve Outcome: Progressing   Problem: Clinical Measurements: Goal: Ability to maintain clinical measurements within normal limits will improve Outcome: Progressing Goal: Will remain free from infection Outcome: Progressing Goal: Diagnostic test results will improve Outcome: Progressing Goal: Respiratory complications will improve Outcome: Progressing Goal: Cardiovascular complication will be avoided Outcome: Progressing   Problem: Activity: Goal: Risk for activity intolerance  will decrease Outcome: Progressing   Problem: Nutrition: Goal: Adequate nutrition will be maintained Outcome: Progressing   Problem: Coping: Goal: Level of anxiety will decrease Outcome: Progressing   Problem: Elimination: Goal: Will not experience complications related to bowel motility Outcome: Progressing Goal: Will not experience complications related to urinary retention Outcome: Progressing   Problem: Pain Managment: Goal: General experience of comfort will improve and/or be controlled Outcome: Progressing   Problem: Safety: Goal: Ability to remain free from injury will improve Outcome: Progressing   Problem: Skin Integrity: Goal: Risk for impaired skin integrity will decrease Outcome: Progressing

## 2024-08-05 NOTE — Progress Notes (Signed)
 Physical Therapy Treatment Patient Details Name: Kelly Krause MRN: [id number] DOB: 17-May-1941 Today's Date: 08/05/2024   History of Present Illness 83 y/o female s/p L THA on 07/29/24. PMH: hx of L SDH and R CVA, colon cancer, Afib, HTN, depression, T2DM    PT Comments  Pt premedicated prior to PT session. Pt continues to demonstrate slow processing to answer questions and verbalizes concern for lack of assistance if she were to return home, however is A&Ox4. Pt able to tolerate ambulating 145ft using RW, however has tendency to veer left while ambulating. Pt required increased cuing and assistance for RW management and sequencing compared to previous session. SPT provided tactile cue to prevent veering and provided min A for RW management.  Pt continues to require min A for STS from EOB for lift and verbal cues for hand placement. Pt able to perform 1 step to simulate step to enter her home, however requires multimodal cuing for safety and to prevent increased pain. Would benefit from skilled PT to address above deficits and promote optimal return to PLOF.    If plan is discharge home, recommend the following: A little help with walking and/or transfers;A little help with bathing/dressing/bathroom;Assistance with cooking/housework;Direct supervision/assist for medications management;Assist for transportation;Direct supervision/assist for financial management;Help with stairs or ramp for entrance;Supervision due to cognitive status   Can travel by private vehicle     Yes  Equipment Recommendations  Other (comment) (TBD at next venue)    Recommendations for Other Services       Precautions / Restrictions Precautions Precautions: Fall Recall of Precautions/Restrictions: Intact Restrictions Weight Bearing Restrictions Per Provider Order: Yes LLE Weight Bearing Per Provider Order: Weight bearing as tolerated     Mobility  Bed Mobility Overal bed mobility: Needs Assistance Bed Mobility:  Supine to Sit     Supine to sit: Supervision     General bed mobility comments: Able to go from supine>sitting at EOB with supervision. Use of bed rails    Transfers Overall transfer level: Needs assistance Equipment used: Rolling walker (2 wheels) Transfers: Sit to/from Stand Sit to Stand: Min assist           General transfer comment: Min A to stand from EOB. Verbal cuing for hand placement.    Ambulation/Gait Ambulation/Gait assistance: Contact guard assist, Min assist Gait Distance (Feet): 150 Feet Assistive device: Rolling walker (2 wheels) Gait Pattern/deviations: Step-through pattern, Antalgic Gait velocity: decreased     General Gait Details: CGA throughout most of ambulation, however pt is requiring increased cuing for RW management and tactile cue to avoid veering. Pt has tendency to veer left while ambulating.   Stairs Stairs: Yes Stairs assistance: Contact guard assist Stair Management: Two rails, Step to pattern Number of Stairs: 1 General stair comments: Able to perform 1 step to simulate steo into her home. Pt reports she typically holds onto door frame to step inside, so pt used B hand rails. Verbal cuing for step to pattern leading with RLE to step up.   Wheelchair Mobility     Tilt Bed    Modified Rankin (Stroke Patients Only)       Balance Overall balance assessment: Needs assistance Sitting-balance support: No upper extremity supported, Feet supported Sitting balance-Leahy Scale: Good Sitting balance - Comments: Able to maintain sitting balance at EOB.   Standing balance support: Bilateral upper extremity supported, During functional activity, Reliant on assistive device for balance Standing balance-Leahy Scale: Fair Standing balance comment: Reliant on RW for balance. Min A  for lift to stand from EOB.                            Communication Communication Communication: Impaired Factors Affecting Communication: Hearing  impaired  Cognition Arousal: Alert Behavior During Therapy: WFL for tasks assessed/performed   PT - Cognitive impairments: No family/caregiver present to determine baseline                       PT - Cognition Comments: Pt continues to present with slow processing/delayed responses. She understands that she does not have any help at home if needed and she knows that her husband is currently in a nursing home after having a stroke. Following commands: Impaired Following commands impaired: Follows one step commands inconsistently, Follows one step commands with increased time    Cueing Cueing Techniques: Verbal cues, Tactile cues  Exercises      General Comments        Pertinent Vitals/Pain Pain Assessment Pain Assessment: Faces Faces Pain Scale: Hurts a little bit Pain Location: L hip Pain Descriptors / Indicators: Grimacing Pain Intervention(s): Monitored during session, RN gave pain meds during session    Home Living                          Prior Function            PT Goals (current goals can now be found in the care plan section) Acute Rehab PT Goals Patient Stated Goal: rehab and less pain PT Goal Formulation: With patient Time For Goal Achievement: 08/13/24 Potential to Achieve Goals: Fair Progress towards PT goals: Progressing toward goals    Frequency    BID      PT Plan      Co-evaluation              AM-PAC PT 6 Clicks Mobility   Outcome Measure  Help needed turning from your back to your side while in a flat bed without using bedrails?: A Little Help needed moving from lying on your back to sitting on the side of a flat bed without using bedrails?: A Little Help needed moving to and from a bed to a chair (including a wheelchair)?: A Little Help needed standing up from a chair using your arms (e.g., wheelchair or bedside chair)?: A Little Help needed to walk in hospital room?: A Little Help needed climbing 3-5 steps  with a railing? : A Little 6 Click Score: 18    End of Session Equipment Utilized During Treatment: Gait belt Activity Tolerance: Patient tolerated treatment well Patient left: in chair;with call bell/phone within reach;with chair alarm set Nurse Communication: Mobility status PT Visit Diagnosis: Muscle weakness (generalized) (M62.81);Unsteadiness on feet (R26.81)     Time: 8594-8574 PT Time Calculation (min) (ACUTE ONLY): 20 min  Charges:                            Johnathin Vanderschaaf, SPT    Doloras [place]ado 08/05/2024, 2:41 PM

## 2024-08-05 NOTE — Progress Notes (Signed)
 Physical Therapy Treatment Patient Details Name: Kelly Krause MRN: [id number] DOB: Jan 06, 1941 Today's Date: 08/05/2024   History of Present Illness 83 y/o female s/p L THA on 07/29/24. PMH: hx of L SDH and R CVA, colon cancer, Afib, HTN, depression, T2DM    PT Comments  Pt able to tolerate ambulation for 163ft using RW however is limited by pain. She continues to demonstrate step-through pattern while ambulating, but towards end of ambulation her gait becomes antalgic. SPT provided CGA while ambulating. Pt was educated about use of RW while ambulating rather than rollator, to which she verbalized understanding.    If plan is discharge home, recommend the following: A little help with walking and/or transfers;A little help with bathing/dressing/bathroom;Assistance with cooking/housework;Direct supervision/assist for medications management;Assist for transportation;Direct supervision/assist for financial management;Help with stairs or ramp for entrance   Can travel by private vehicle     Yes  Equipment Recommendations  Rolling walker (2 wheels);BSC/3in1    Recommendations for Other Services       Precautions / Restrictions Precautions Precautions: Fall Recall of Precautions/Restrictions: Intact Restrictions Weight Bearing Restrictions Per Provider Order: Yes LLE Weight Bearing Per Provider Order: Weight bearing as tolerated     Mobility  Bed Mobility Overal bed mobility: Needs Assistance             General bed mobility comments: NT. Pt in recliner pre/post session.    Transfers Overall transfer level: Needs assistance Equipment used: Rolling walker (2 wheels) Transfers: Sit to/from Stand Sit to Stand: Min assist           General transfer comment: Min A to stand from recliner    Ambulation/Gait Ambulation/Gait assistance: Contact guard assist Gait Distance (Feet): 100 Feet Assistive device: Rolling walker (2 wheels) Gait Pattern/deviations: Step-through  pattern, Antalgic Gait velocity: decreased     General Gait Details: CGA throughout most of ambulation and pt demonstrated step-through pattern. Antalgic gait towards end of ambulation. 1 standing rest break d/t pain.   Stairs             Wheelchair Mobility     Tilt Bed    Modified Rankin (Stroke Patients Only)       Balance Overall balance assessment: Needs assistance Sitting-balance support: No upper extremity supported, Feet supported Sitting balance-Leahy Scale: Good Sitting balance - Comments: Able to maintain sitting balance in recliner. Request for towel roll behind her neck 2/2 neck pain   Standing balance support: Bilateral upper extremity supported, During functional activity, Reliant on assistive device for balance Standing balance-Leahy Scale: Fair Standing balance comment: Reliant on walker for balance.                            Communication Communication Communication: No apparent difficulties Factors Affecting Communication: Hearing impaired  Cognition Arousal: Alert Behavior During Therapy: WFL for tasks assessed/performed   PT - Cognitive impairments: No family/caregiver present to determine baseline                       PT - Cognition Comments: Pt continues to present with slow processing/delayed responses, however she is able to answer questions appropriately. Following commands: Impaired Following commands impaired: Follows one step commands with increased time    Cueing Cueing Techniques: Verbal cues, Tactile cues  Exercises      General Comments        Pertinent Vitals/Pain Pain Assessment Pain Assessment: Faces Faces Pain Scale: Hurts little more  Pain Location: L hip Pain Descriptors / Indicators: Grimacing Pain Intervention(s): Limited activity within patient's tolerance, Monitored during session    Home Living                          Prior Function            PT Goals (current goals  can now be found in the care plan section) Acute Rehab PT Goals Patient Stated Goal: rehab and less pain PT Goal Formulation: With patient Time For Goal Achievement: 08/13/24 Potential to Achieve Goals: Fair    Frequency    BID      PT Plan      Co-evaluation              AM-PAC PT 6 Clicks Mobility   Outcome Measure  Help needed turning from your back to your side while in a flat bed without using bedrails?: A Little Help needed moving from lying on your back to sitting on the side of a flat bed without using bedrails?: A Little Help needed moving to and from a bed to a chair (including a wheelchair)?: A Little Help needed standing up from a chair using your arms (e.g., wheelchair or bedside chair)?: A Little Help needed to walk in hospital room?: A Little Help needed climbing 3-5 steps with a railing? : A Lot 6 Click Score: 17    End of Session Equipment Utilized During Treatment: Gait belt Activity Tolerance: Patient tolerated treatment well Patient left: in chair;with call bell/phone within reach;with chair alarm set Nurse Communication: Mobility status PT Visit Diagnosis: Muscle weakness (generalized) (M62.81);Unsteadiness on feet (R26.81)     Time: 9064-9047 PT Time Calculation (min) (ACUTE ONLY): 17 min  Charges:                            Taylorann Tkach, SPT    Vanette Noguchi 08/05/2024, 11:07 AM

## 2024-08-05 NOTE — Progress Notes (Signed)
 Occupational Therapy Treatment Patient Details Name: Kelly Krause MRN: [id number] DOB: [birth year] Today's Date: 08/05/2024   History of present illness 83 y/o female s/p L THA on 07/29/24. PMH: hx of L SDH and R CVA, colon cancer, Afib, HTN, depression, T2DM   OT comments  Kelly Krause was seen for OT treatment on this date. Upon arrival to room pt seated in chair, agreeable to tx. Pt requires MIN A don underwear sitting. SBA + RW for ADL t/f ~200 ft. SUPERVISION seated toileting/pericare and standing hand washing. Scored 5/28 on Short Blessed Test indicating normal cognition. Pt making good progress toward goals, will continue to follow POC. Discharge recommendation remains appropriate.       If plan is discharge home, recommend the following:  A little help with walking and/or transfers;A little help with bathing/dressing/bathroom;Assistance with cooking/housework   Equipment Recommendations  BSC/3in1    Recommendations for Other Services      Precautions / Restrictions Precautions Precautions: Fall Recall of Precautions/Restrictions: Intact Restrictions Weight Bearing Restrictions Per Provider Order: Yes LLE Weight Bearing Per Provider Order: Weight bearing as tolerated       Mobility Bed Mobility Overal bed mobility: Needs Assistance Bed Mobility: Sit to Supine       Sit to supine: Min assist   General bed mobility comments: assist for LLE    Transfers Overall transfer level: Needs assistance Equipment used: Rolling walker (2 wheels) Transfers: Sit to/from Stand Sit to Stand: Supervision                 Balance Overall balance assessment: Needs assistance Sitting-balance support: No upper extremity supported, Feet supported Sitting balance-Leahy Scale: Good     Standing balance support: No upper extremity supported, During functional activity Standing balance-Leahy Scale: Fair                             ADL either performed or assessed  with clinical judgement   ADL Overall ADL's : Needs assistance/impaired                                       General ADL Comments: MIN A don underwear sitting. SBA + RW for ADL t/f ~200 ft. SUPERVISION seated toileting/pericare and standing hand washing.     Communication Communication Communication: Impaired Factors Affecting Communication: Hearing impaired   Cognition Arousal: Alert Behavior During Therapy: WFL for tasks assessed/performed Cognition: No apparent impairments             OT - Cognition Comments: suspect hard of hearing impacting processing speed                 Following commands: Impaired Following commands impaired: Follows multi-step commands inconsistently                    Pertinent Vitals/ Pain       Pain Assessment Pain Assessment: Faces Faces Pain Scale: Hurts little more Pain Location: L hip Pain Descriptors / Indicators: Grimacing Pain Intervention(s): Limited activity within patient's tolerance   Frequency  Min 2X/week        Progress Toward Goals  OT Goals(current goals can now be found in the care plan section)  Progress towards OT goals: Progressing toward goals  Acute Rehab OT Goals OT Goal Formulation: With patient Time For Goal Achievement: 08/16/24 Potential to Achieve Goals: Good ADL  Goals Pt Will Perform Grooming: with supervision;standing Pt Will Perform Lower Body Dressing: with modified independence;sit to/from stand Pt Will Transfer to Toilet: with modified independence;bedside commode Pt Will Perform Toileting - Clothing Manipulation and hygiene: with modified independence;sit to/from stand  Plan      Co-evaluation                 AM-PAC OT 6 Clicks Daily Activity     Outcome Measure   Help from another person eating meals?: None Help from another person taking care of personal grooming?: None Help from another person toileting, which includes using toliet, bedpan, or  urinal?: A Little Help from another person bathing (including washing, rinsing, drying)?: A Little Help from another person to put on and taking off regular upper body clothing?: None Help from another person to put on and taking off regular lower body clothing?: A Lot 6 Click Score: 20    End of Session Equipment Utilized During Treatment: Rolling walker (2 wheels)  OT Visit Diagnosis: Unsteadiness on feet (R26.81);Muscle weakness (generalized) (M62.81)   Activity Tolerance Patient tolerated treatment well;Patient limited by pain   Patient Left in bed;with bed alarm set;with call bell/phone within reach   Nurse Communication          Time: 8873-8853 OT Time Calculation (min): 20 min  Charges: OT General Charges $OT Visit: 1 Visit OT Treatments $Self Care/Home Management : 8-22 mins  Elston Slot, M.S. OTR/L  08/05/24, 2:17 PM  ascom [phone]

## 2024-08-05 NOTE — TOC Progression Note (Signed)
 Transition of Care [facility]) - Progression Note    Patient Details  Name: Kelly Krause MRN: [id number] Date of Birth: 06-25-1941  Transition of Care [facility]) CM/SW Contact  Alvaro Louder, [place] Phone Number: 08/05/2024, 3:00 PM  Clinical Narrative:   Insurance auth approved for patient to admit to [facility]. Certified in Total Certification# [id number]   TOC to follow for discharge                      Expected Discharge Plan and Services                                               Social Drivers of Health (SDOH) Interventions SDOH Screenings   Food Insecurity: No Food Insecurity (08/01/2024)  Housing: Low Risk  (08/01/2024)  Transportation Needs: No Transportation Needs (08/01/2024)  Utilities: Not At Risk (08/01/2024)  Financial Resource Strain: Low Risk  (06/12/2024)   Received from [facility]  Social Connections: Socially Integrated (08/01/2024)  Tobacco Use: Medium Risk (07/29/2024)  Health Literacy: Low Risk  (02/28/2021)   Received from [facility]    Readmission Risk Interventions     No data to display

## 2024-08-05 NOTE — Plan of Care (Signed)
  Problem: Education: Goal: Knowledge of the prescribed therapeutic regimen will improve Outcome: Progressing Goal: Understanding of discharge needs will improve Outcome: Progressing   Problem: Activity: Goal: Ability to avoid complications of mobility impairment will improve Outcome: Progressing Goal: Ability to tolerate increased activity will improve Outcome: Progressing   Problem: Clinical Measurements: Goal: Postoperative complications will be avoided or minimized Outcome: Progressing   Problem: Pain Management: Goal: Pain level will decrease with appropriate interventions Outcome: Progressing   Problem: Skin Integrity: Goal: Will show signs of wound healing Outcome: Progressing   Problem: Education: Goal: Knowledge of General Education information will improve Description: Including pain rating scale, medication(s)/side effects and non-pharmacologic comfort measures Outcome: Progressing   Problem: Health Behavior/Discharge Planning: Goal: Ability to manage health-related needs will improve Outcome: Progressing   Problem: Clinical Measurements: Goal: Will remain Miyanna Wiersma from infection Outcome: Progressing Goal: Respiratory complications will improve Outcome: Progressing Goal: Cardiovascular complication will be avoided Outcome: Progressing   Problem: Activity: Goal: Risk for activity intolerance will decrease Outcome: Progressing   Problem: Nutrition: Goal: Adequate nutrition will be maintained Outcome: Progressing   Problem: Coping: Goal: Level of anxiety will decrease Outcome: Progressing   Problem: Elimination: Goal: Will not experience complications related to bowel motility Outcome: Progressing Goal: Will not experience complications related to urinary retention Outcome: Progressing   Problem: Pain Managment: Goal: General experience of comfort will improve and/or be controlled Outcome: Progressing   Problem: Safety: Goal: Ability to remain Latima Hamza  from injury will improve Outcome: Progressing   Problem: Skin Integrity: Goal: Risk for impaired skin integrity will decrease Outcome: Progressing

## 2024-08-06 DIAGNOSIS — I4891 Unspecified atrial fibrillation: Secondary | ICD-10-CM | POA: Diagnosis not present

## 2024-08-06 DIAGNOSIS — D649 Anemia, unspecified: Secondary | ICD-10-CM | POA: Diagnosis not present

## 2024-08-06 DIAGNOSIS — K5790 Diverticulosis of intestine, part unspecified, without perforation or abscess without bleeding: Secondary | ICD-10-CM | POA: Diagnosis not present

## 2024-08-06 DIAGNOSIS — E870 Hyperosmolality and hypernatremia: Secondary | ICD-10-CM | POA: Diagnosis not present

## 2024-08-06 DIAGNOSIS — Z8673 Personal history of transient ischemic attack (TIA), and cerebral infarction without residual deficits: Secondary | ICD-10-CM | POA: Diagnosis not present

## 2024-08-06 DIAGNOSIS — Z96642 Presence of left artificial hip joint: Secondary | ICD-10-CM | POA: Diagnosis not present

## 2024-08-06 DIAGNOSIS — I480 Paroxysmal atrial fibrillation: Secondary | ICD-10-CM | POA: Diagnosis not present

## 2024-08-06 DIAGNOSIS — G8918 Other acute postprocedural pain: Secondary | ICD-10-CM | POA: Diagnosis not present

## 2024-08-06 DIAGNOSIS — M1612 Unilateral primary osteoarthritis, left hip: Secondary | ICD-10-CM | POA: Diagnosis not present

## 2024-08-06 DIAGNOSIS — F330 Major depressive disorder, recurrent, mild: Secondary | ICD-10-CM | POA: Diagnosis not present

## 2024-08-06 DIAGNOSIS — F418 Other specified anxiety disorders: Secondary | ICD-10-CM | POA: Diagnosis not present

## 2024-08-06 DIAGNOSIS — F32A Depression, unspecified: Secondary | ICD-10-CM | POA: Diagnosis not present

## 2024-08-06 DIAGNOSIS — Z743 Need for continuous supervision: Secondary | ICD-10-CM | POA: Diagnosis not present

## 2024-08-06 DIAGNOSIS — Z85118 Personal history of other malignant neoplasm of bronchus and lung: Secondary | ICD-10-CM | POA: Diagnosis not present

## 2024-08-06 DIAGNOSIS — Z9221 Personal history of antineoplastic chemotherapy: Secondary | ICD-10-CM | POA: Diagnosis not present

## 2024-08-06 DIAGNOSIS — E785 Hyperlipidemia, unspecified: Secondary | ICD-10-CM | POA: Diagnosis not present

## 2024-08-06 DIAGNOSIS — Z471 Aftercare following joint replacement surgery: Secondary | ICD-10-CM | POA: Diagnosis not present

## 2024-08-06 DIAGNOSIS — E782 Mixed hyperlipidemia: Secondary | ICD-10-CM | POA: Diagnosis not present

## 2024-08-06 DIAGNOSIS — Z85038 Personal history of other malignant neoplasm of large intestine: Secondary | ICD-10-CM | POA: Diagnosis not present

## 2024-08-06 DIAGNOSIS — S7292XA Unspecified fracture of left femur, initial encounter for closed fracture: Secondary | ICD-10-CM | POA: Diagnosis not present

## 2024-08-06 DIAGNOSIS — K219 Gastro-esophageal reflux disease without esophagitis: Secondary | ICD-10-CM | POA: Diagnosis not present

## 2024-08-06 DIAGNOSIS — I1310 Hypertensive heart and chronic kidney disease without heart failure, with stage 1 through stage 4 chronic kidney disease, or unspecified chronic kidney disease: Secondary | ICD-10-CM | POA: Diagnosis not present

## 2024-08-06 DIAGNOSIS — C189 Malignant neoplasm of colon, unspecified: Secondary | ICD-10-CM | POA: Diagnosis not present

## 2024-08-06 DIAGNOSIS — S72009A Fracture of unspecified part of neck of unspecified femur, initial encounter for closed fracture: Secondary | ICD-10-CM | POA: Diagnosis not present

## 2024-08-06 DIAGNOSIS — E119 Type 2 diabetes mellitus without complications: Secondary | ICD-10-CM | POA: Diagnosis not present

## 2024-08-06 DIAGNOSIS — I10 Essential (primary) hypertension: Secondary | ICD-10-CM | POA: Diagnosis not present

## 2024-08-06 DIAGNOSIS — Z7901 Long term (current) use of anticoagulants: Secondary | ICD-10-CM | POA: Diagnosis not present

## 2024-08-06 DIAGNOSIS — Z8501 Personal history of malignant neoplasm of esophagus: Secondary | ICD-10-CM | POA: Diagnosis not present

## 2024-08-06 DIAGNOSIS — Z7401 Bed confinement status: Secondary | ICD-10-CM | POA: Diagnosis not present

## 2024-08-06 DIAGNOSIS — N182 Chronic kidney disease, stage 2 (mild): Secondary | ICD-10-CM | POA: Diagnosis not present

## 2024-08-06 DIAGNOSIS — J309 Allergic rhinitis, unspecified: Secondary | ICD-10-CM | POA: Diagnosis not present

## 2024-08-06 DIAGNOSIS — K5901 Slow transit constipation: Secondary | ICD-10-CM | POA: Diagnosis not present

## 2024-08-06 DIAGNOSIS — Z923 Personal history of irradiation: Secondary | ICD-10-CM | POA: Diagnosis not present

## 2024-08-06 NOTE — TOC Transition Note (Signed)
 Transition of Care [facility]) - Discharge Note   Patient Details  Name: Kelly Krause MRN: [id number] Date of Birth: 07-Apr-1941  Transition of Care [facility] CM/SW Contact:  Alvaro Louder, LCSW Phone Number: 08/06/2024, 2:34 PM   Clinical Narrative:     LCSWA received insurance approval for patient to admit to SNF [facility]. LCSWA confirmed with MD that patient is stable for discharge. LCSWA notified the patient and they are in agreement with discharge. LCSWA confirmed bed is available at SNF. Transport arranged with [facility] for next available.  Number to call report [phone], RM [phone]  Final next level of care: Skilled Nursing Facility Barriers to Discharge: No Barriers Identified   Patient Goals and CMS Choice            Discharge Placement              Patient [facility] bed at: [facility] Patient to be transferred to facility by: [facility] Name of family member notified: Self Patient and family notified of of transfer: 08/06/24  Discharge Plan and Services Additional resources added to the After Visit Summary for                                       Social Drivers of Health (SDOH) Interventions SDOH Screenings   Food Insecurity: No Food Insecurity (08/01/2024)  Housing: Low Risk  (08/01/2024)  Transportation Needs: No Transportation Needs (08/01/2024)  Utilities: Not At Risk (08/01/2024)  Financial Resource Strain: Low Risk  (06/12/2024)   Received from [facility]  Social Connections: Socially Integrated (08/01/2024)  Tobacco Use: Medium Risk (07/29/2024)  Health Literacy: Low Risk  (02/28/2021)   Received from [facility]     Readmission Risk Interventions     No data to display

## 2024-08-06 NOTE — Plan of Care (Signed)
  Problem: Education: Goal: Knowledge of the prescribed therapeutic regimen will improve Outcome: Adequate for Discharge Goal: Understanding of discharge needs will improve Outcome: Adequate for Discharge Goal: Individualized Educational Video(s) Outcome: Adequate for Discharge   Problem: Activity: Goal: Ability to avoid complications of mobility impairment will improve Outcome: Adequate for Discharge Goal: Ability to tolerate increased activity will improve Outcome: Adequate for Discharge   Problem: Clinical Measurements: Goal: Postoperative complications will be avoided or minimized Outcome: Adequate for Discharge   Problem: Pain Management: Goal: Pain level will decrease with appropriate interventions Outcome: Adequate for Discharge   Problem: Skin Integrity: Goal: Will show signs of wound healing Outcome: Adequate for Discharge   Problem: Education: Goal: Knowledge of General Education information will improve Description: Including pain rating scale, medication(s)/side effects and non-pharmacologic comfort measures Outcome: Adequate for Discharge   Problem: Health Behavior/Discharge Planning: Goal: Ability to manage health-related needs will improve Outcome: Adequate for Discharge   Problem: Clinical Measurements: Goal: Ability to maintain clinical measurements within normal limits will improve Outcome: Adequate for Discharge Goal: Will remain free from infection Outcome: Adequate for Discharge Goal: Diagnostic test results will improve Outcome: Adequate for Discharge Goal: Respiratory complications will improve Outcome: Adequate for Discharge Goal: Cardiovascular complication will be avoided Outcome: Adequate for Discharge   Problem: Activity: Goal: Risk for activity intolerance will decrease Outcome: Adequate for Discharge   Problem: Nutrition: Goal: Adequate nutrition will be maintained Outcome: Adequate for Discharge   Problem: Coping: Goal: Level of  anxiety will decrease Outcome: Adequate for Discharge   Problem: Elimination: Goal: Will not experience complications related to bowel motility Outcome: Adequate for Discharge Goal: Will not experience complications related to urinary retention Outcome: Adequate for Discharge   Problem: Pain Managment: Goal: General experience of comfort will improve and/or be controlled Outcome: Adequate for Discharge   Problem: Safety: Goal: Ability to remain free from injury will improve Outcome: Adequate for Discharge   Problem: Skin Integrity: Goal: Risk for impaired skin integrity will decrease Outcome: Adequate for Discharge

## 2024-08-06 NOTE — Plan of Care (Signed)
  Problem: Activity: Goal: Ability to tolerate increased activity will improve Outcome: Progressing   Problem: Pain Management: Goal: Pain level will decrease with appropriate interventions Outcome: Progressing   Problem: Activity: Goal: Risk for activity intolerance will decrease Outcome: Progressing   Problem: Coping: Goal: Level of anxiety will decrease Outcome: Progressing   Problem: Pain Managment: Goal: General experience of comfort will improve and/or be controlled Outcome: Progressing

## 2024-08-06 NOTE — Progress Notes (Signed)
   Subjective: 8 Days Post-Op Procedure(s) (LRB): ARTHROPLASTY, HIP, TOTAL, ANTERIOR APPROACH (Left) Patient reports pain as mild  Patient is well, and has had no acute complaints or problems Denies any CP, SOB, ABD pain. No N/V. Tolerating po well + BM We will continue therapy today, slow progress  Plan is to go Skilled nursing facility after hospital stay for patient safety.  Objective: Vital signs in last 24 hours: Temp:  [98 F (36.7 C)-98.4 F (36.9 C)] 98.4 F (36.9 C) (09/16 0539) Pulse Rate:  [68-72] 68 (09/16 0539) Resp:  [16-18] 17 (09/16 0539) BP: (120-133)/(48-61) 133/61 (09/16 0539) SpO2:  [93 %-99 %] 93 % (09/16 0539)  Intake/Output from previous day: 09/15 0701 - 09/16 0700 In: 370 [P.O.:120] Out: -  Intake/Output this shift: No intake/output data recorded.  No results for input(s): HGB in the last 72 hours.  No results for input(s): WBC, RBC, HCT, PLT in the last 72 hours.  No results for input(s): NA, K, CL, CO2, BUN, CREATININE, GLUCOSE, CALCIUM  in the last 72 hours.  No results for input(s): LABPT, INR in the last 72 hours.  EXAM General - Patient is Alert, Appropriate, and Oriented Adbomen - Soft, non-tender, non-distended.  Extremity - Sensation intact distally Intact pulses distally Dorsiflexion/Plantar flexion intact No cellulitis present Compartment soft Dressing - dressing C/D/I and no drainage Motor Function - intact, moving foot and toes well on exam.   Past Medical History:  Diagnosis Date   Allergy    Anticoagulated on apixaban     Atrial fibrillation (HCC)    Colon cancer (HCC)    chemo/rad 15 yrs   DDD (degenerative disc disease), cervical    DM (diabetes mellitus), type 2 (HCC)    controlled no medications   Elevated lipids    Esophageal cancer (HCC)    GERD (gastroesophageal reflux disease)    History of blood transfusion    Hyperlipidemia    Hypertension    Lung cancer (HCC)    chemo    Major depressive disorder, recurrent episode, moderate (HCC)    Osteoarthritis of left hip    Persistent disorder of initiating or maintaining sleep    Personal history of chemotherapy    Personal history of radiation therapy    colon   Stroke (HCC) 06/03/2021   No Deficits   SVT (supraventricular tachycardia) (HCC)     Assessment/Plan:   8 Days Post-Op Procedure(s) (LRB): ARTHROPLASTY, HIP, TOTAL, ANTERIOR APPROACH (Left) Principal Problem:   S/P total left hip arthroplasty  Estimated body mass index is 25.37 kg/m as calculated from the following:   Height as of this encounter: 5' 7 (1.702 m).   Weight as of this encounter: 73.5 kg. Advance diet Up with therapy, slow progress with PT.  Pain well controlled Vital signs are stable CM to assist with discharge to SNF today  DVT Prophylaxis - TED hose and SCDs Eliquis  Weight-Bearing as tolerated to left leg   T. Medford Amber, PA-C [facility] 08/06/2024, 8:28 AM

## 2024-08-06 NOTE — Care Plan (Signed)
 Report given to Diplomatic Services operational officer at [facility]. IV removed Patient took all belongings with her.  Questions addressed Patient transported to [facility] via EMS

## 2024-08-09 DIAGNOSIS — I10 Essential (primary) hypertension: Secondary | ICD-10-CM | POA: Diagnosis not present

## 2024-08-09 DIAGNOSIS — Z96642 Presence of left artificial hip joint: Secondary | ICD-10-CM | POA: Diagnosis not present

## 2024-08-09 DIAGNOSIS — M1612 Unilateral primary osteoarthritis, left hip: Secondary | ICD-10-CM | POA: Diagnosis not present

## 2024-08-09 DIAGNOSIS — I4891 Unspecified atrial fibrillation: Secondary | ICD-10-CM | POA: Diagnosis not present

## 2024-08-09 DIAGNOSIS — E870 Hyperosmolality and hypernatremia: Secondary | ICD-10-CM | POA: Diagnosis not present

## 2024-08-09 DIAGNOSIS — G8918 Other acute postprocedural pain: Secondary | ICD-10-CM | POA: Diagnosis not present

## 2024-08-09 DIAGNOSIS — Z8673 Personal history of transient ischemic attack (TIA), and cerebral infarction without residual deficits: Secondary | ICD-10-CM | POA: Diagnosis not present

## 2024-08-09 DIAGNOSIS — F330 Major depressive disorder, recurrent, mild: Secondary | ICD-10-CM | POA: Diagnosis not present

## 2024-08-12 DIAGNOSIS — Z8673 Personal history of transient ischemic attack (TIA), and cerebral infarction without residual deficits: Secondary | ICD-10-CM | POA: Diagnosis not present

## 2024-08-12 DIAGNOSIS — I480 Paroxysmal atrial fibrillation: Secondary | ICD-10-CM | POA: Diagnosis not present

## 2024-08-12 DIAGNOSIS — I10 Essential (primary) hypertension: Secondary | ICD-10-CM | POA: Diagnosis not present

## 2024-08-12 DIAGNOSIS — N182 Chronic kidney disease, stage 2 (mild): Secondary | ICD-10-CM | POA: Diagnosis not present

## 2024-08-12 DIAGNOSIS — E785 Hyperlipidemia, unspecified: Secondary | ICD-10-CM | POA: Diagnosis not present

## 2024-08-12 DIAGNOSIS — K5901 Slow transit constipation: Secondary | ICD-10-CM | POA: Diagnosis not present

## 2024-08-12 DIAGNOSIS — K219 Gastro-esophageal reflux disease without esophagitis: Secondary | ICD-10-CM | POA: Diagnosis not present

## 2024-08-12 DIAGNOSIS — F32A Depression, unspecified: Secondary | ICD-10-CM | POA: Diagnosis not present

## 2024-08-12 DIAGNOSIS — S72009A Fracture of unspecified part of neck of unspecified femur, initial encounter for closed fracture: Secondary | ICD-10-CM | POA: Diagnosis not present

## 2024-08-14 DIAGNOSIS — I4891 Unspecified atrial fibrillation: Secondary | ICD-10-CM | POA: Diagnosis not present

## 2024-08-14 DIAGNOSIS — Z8673 Personal history of transient ischemic attack (TIA), and cerebral infarction without residual deficits: Secondary | ICD-10-CM | POA: Diagnosis not present

## 2024-08-14 DIAGNOSIS — E870 Hyperosmolality and hypernatremia: Secondary | ICD-10-CM | POA: Diagnosis not present

## 2024-08-14 DIAGNOSIS — G8918 Other acute postprocedural pain: Secondary | ICD-10-CM | POA: Diagnosis not present

## 2024-08-14 DIAGNOSIS — E782 Mixed hyperlipidemia: Secondary | ICD-10-CM | POA: Diagnosis not present

## 2024-08-14 DIAGNOSIS — Z96642 Presence of left artificial hip joint: Secondary | ICD-10-CM | POA: Diagnosis not present

## 2024-08-14 DIAGNOSIS — M1612 Unilateral primary osteoarthritis, left hip: Secondary | ICD-10-CM | POA: Diagnosis not present

## 2024-08-14 DIAGNOSIS — N182 Chronic kidney disease, stage 2 (mild): Secondary | ICD-10-CM | POA: Diagnosis not present

## 2024-08-14 DIAGNOSIS — K219 Gastro-esophageal reflux disease without esophagitis: Secondary | ICD-10-CM | POA: Diagnosis not present

## 2024-08-14 DIAGNOSIS — F330 Major depressive disorder, recurrent, mild: Secondary | ICD-10-CM | POA: Diagnosis not present

## 2024-08-14 DIAGNOSIS — E785 Hyperlipidemia, unspecified: Secondary | ICD-10-CM | POA: Diagnosis not present

## 2024-08-14 DIAGNOSIS — I1310 Hypertensive heart and chronic kidney disease without heart failure, with stage 1 through stage 4 chronic kidney disease, or unspecified chronic kidney disease: Secondary | ICD-10-CM | POA: Diagnosis not present

## 2024-09-10 DIAGNOSIS — M1612 Unilateral primary osteoarthritis, left hip: Secondary | ICD-10-CM | POA: Diagnosis not present

## 2024-09-11 DIAGNOSIS — J309 Allergic rhinitis, unspecified: Secondary | ICD-10-CM | POA: Diagnosis not present

## 2024-09-11 DIAGNOSIS — Z471 Aftercare following joint replacement surgery: Secondary | ICD-10-CM | POA: Diagnosis not present

## 2024-09-11 DIAGNOSIS — I10 Essential (primary) hypertension: Secondary | ICD-10-CM | POA: Diagnosis not present

## 2024-09-11 DIAGNOSIS — E785 Hyperlipidemia, unspecified: Secondary | ICD-10-CM | POA: Diagnosis not present

## 2024-09-11 DIAGNOSIS — I4891 Unspecified atrial fibrillation: Secondary | ICD-10-CM | POA: Diagnosis not present

## 2024-09-11 DIAGNOSIS — Z96642 Presence of left artificial hip joint: Secondary | ICD-10-CM | POA: Diagnosis not present

## 2024-09-11 DIAGNOSIS — F418 Other specified anxiety disorders: Secondary | ICD-10-CM | POA: Diagnosis not present

## 2024-09-11 DIAGNOSIS — E119 Type 2 diabetes mellitus without complications: Secondary | ICD-10-CM | POA: Diagnosis not present

## 2024-09-11 DIAGNOSIS — Z7901 Long term (current) use of anticoagulants: Secondary | ICD-10-CM | POA: Diagnosis not present

## 2024-09-11 DIAGNOSIS — K219 Gastro-esophageal reflux disease without esophagitis: Secondary | ICD-10-CM | POA: Diagnosis not present

## 2024-09-11 DIAGNOSIS — K5790 Diverticulosis of intestine, part unspecified, without perforation or abscess without bleeding: Secondary | ICD-10-CM | POA: Diagnosis not present

## 2024-09-11 DIAGNOSIS — D649 Anemia, unspecified: Secondary | ICD-10-CM | POA: Diagnosis not present

## 2024-09-25 DIAGNOSIS — J811 Chronic pulmonary edema: Secondary | ICD-10-CM | POA: Diagnosis not present

## 2024-09-25 DIAGNOSIS — Z87891 Personal history of nicotine dependence: Secondary | ICD-10-CM | POA: Diagnosis not present

## 2024-09-25 DIAGNOSIS — E119 Type 2 diabetes mellitus without complications: Secondary | ICD-10-CM | POA: Diagnosis not present

## 2024-09-25 DIAGNOSIS — Z79899 Other long term (current) drug therapy: Secondary | ICD-10-CM | POA: Diagnosis not present

## 2024-09-25 DIAGNOSIS — I4891 Unspecified atrial fibrillation: Secondary | ICD-10-CM | POA: Diagnosis not present

## 2024-09-25 DIAGNOSIS — J069 Acute upper respiratory infection, unspecified: Secondary | ICD-10-CM | POA: Diagnosis not present

## 2024-09-25 DIAGNOSIS — Z7901 Long term (current) use of anticoagulants: Secondary | ICD-10-CM | POA: Diagnosis not present

## 2024-09-25 DIAGNOSIS — I10 Essential (primary) hypertension: Secondary | ICD-10-CM | POA: Diagnosis not present

## 2024-09-25 DIAGNOSIS — R0902 Hypoxemia: Secondary | ICD-10-CM | POA: Diagnosis not present

## 2024-09-25 DIAGNOSIS — Z902 Acquired absence of lung [part of]: Secondary | ICD-10-CM | POA: Diagnosis not present

## 2024-09-25 NOTE — ED Provider Notes (Signed)
 EMERGENCY DEPARTMENT ENCOUNTER     CHIEF COMPLAINT   Chief Complaint  Patient presents with  . Shortness of Breath  . Nasal Congestion  . Cough      COMPLEXITY AND PROBLEMS: MEDICAL DECISION MAKING   Kelly Krause is a 83 y.o. female w/ the above past medical history and clinical presentation which is concerning for COVID, flu, RSV, other viral upper respiratory infection, asthma/COPD exacerbation, bacterial pneumonia, acute otitis media, otalgia, hypoxic respiratory failure, among other etiologies in the differential diagnosis.   Viral upper respiratory infection with cough is the most likely etiology for her presentation.  Patient has congestion and cough.  She was mildly hypoxic but improved to 93 to 95% on room air in the room.  There is no evidence of acute respiratory distress or failure.  No indication for supplemental oxygenation or admission for pulm toilet.  Incentive spirometer teaching provided and discharged with spirometer.  Recommend guaifenesin to help with expectorating the chest congestion.  RSV/COVID/flu swab is negative.  Chest x-ray reveals no focal consolidation.  There is a concern of possible cortical abnormality in the 5th and 6th ribs which were discussed with the patient's son as well as with the radiologist of these are felt to be postoperative changes.  No indication for CT chest or a CTA of the chest as the patient does not have any pleuritic chest pain.  Laboratory evaluation reveals no leukocytosis or anemia.  Procalcitonin is also negative.  No indication for antibiotics at this time.  I considered the need for hospitalization, however, she is tolerating orals and has no need for admission for continued IV fluid resuscitation, has pain that is adequately controlled with oral medication, has reasonable ability to follow-up with outpatient care in an appropriate timeframe without barriers to return to ED and understands return precautions.     Additionally, we discussed verbally, and/or in the written discharge instructions:  Follow-up with primary care provider given reassuring and/or nondiagnostic emergency department evaluation today with no indication for admission. OTC cough and cold medications for symptoms relief.     HISTORY OF THE PRESENT ILLNESS   ED Triage Notes by Kelly Lear Leibbrandt, RN at 09/25/24 1757       Chief complaint: PT with history of partial lobectomy d/t cancer and pneumonia C/O cough, congestion, and SOB worsening since onset 3 weeks ago. PT states initially she had greenish productive sputum, but is now unable to expectorate the sputum.    EMERGENCY DEPARTMENT ARRIVAL SUMMARY Kelly Krause [facility] 09/25/24  17:58 PST   Residence: Private Residence   Facility Name & Contact Info (address & phone number):    Mobility:  Ambulatory (Independent)  Transport Type: Private vehicle  Additional Information:       Kelly Krause is a 83 y.o. female w/ past medical history of lung cancer status post partial lobectomy, atrial fibrillation anticoagulated on apixaban , history of colon cancer, diabetes, hypertension recent admission for left total hip arthroplasty, who is presenting for the following as reviewed in the nursing triage note above.    The patient presents with cough and shortness of breath she states she has had the symptoms for about a week.  Initially productive of greenish sputum but has not been able to cough anything up for the last couple of days.  No chest pain with coughing.  No chest pain at rest.  She does feel short of breath.  Does not use oxygen at home.  Has remote  history of partial lobectomy for metastatic colon cancer but has not had issues with recurrent pneumonia or other lung issues since then.  No intrinsic pulmonary disease.  No fevers.  Eating and drinking normally without vomiting.   PAST MEDICAL/SURGICAL HISTORY CURRENT  MEDICATIONS  Past Medical History[1]  Past Surgical History[2]  PTA Meds[3]     OBJECTIVE DATA   VITAL SIGNS PHYSICAL EXAM  ED Triage Vitals [09/25/24 1756]  Encounter Vitals Group     BP 152/81     Girls Systolic BP Percentile      Girls Diastolic BP Percentile      Boys Systolic BP Percentile      Boys Diastolic BP Percentile      Heart Rate 91     Resp 20     Temp 37.1 C (98.8 F)     Temp Source Oral     SpO2 90 %     Weight 72.6 kg (160 lb)     Height 1.702 m (5' 7)     Head Circumference      Peak Flow      Pain Score      Pain Loc      Pain Education      Exclude from Growth Chart      GEN: alert and in no acute distress, speaking in complete sentences  HENT: normocephalic, atraumatic, no external ear deformities, no rhinorrhea EYE: PERRL, EOMi, non-icteric  Neck: supple, no JVD Pulm/Chest: Normal work of breathing, good bilateral air movement, no wheezes, no crackles, nonproductive cough CV: regular rate and rhythm, no murmurs/rubs gallops, symmetric pulses Abd: not distended MSK/extremity: No peripheral edema, no deformities Skin: No wounds or rashes Neuro: face symmetrical, speech is clear, spontaneously moving all extremities symmetrically    QUANTITY AND COMPLEXITY OF DATA REVIEWED  TESTS ORDERED  Orders Placed This Encounter  Procedures  . Influenza A,B,COVID-19,NAAT  . RSV RNA, NAAT  . XR Chest PA and Lateral  . CBC with Differential  . Comprehensive Metabolic Panel  . Lactic Acid  . Procalcitonin  . Blood Gas, Venous: Perform Blood Gas: Yes  . Blood Gas, Venous:  . Incentive spirometry  . Insert 1 peripheral IV    INDEPENDENTLY REVIEWED DATA/RESULTS, CONSULTATIONS, AND DEVELOPMENTS Medical Decision Making as of 09/25/24 1942  Wed Sep 25, 2024  1802 I reviewed the vital signs and they are notable for: mild hypoxia 90% on RA  1827 CBC with Differential(!) I interpreted the results of the CBC and note no leukocytosis, no significant  anemia, no thrombocytopenia   1828 Blood Gas, Venous: Perform Blood Gas: Yes(!) My independent interpretation of the VBG reveals no significant acid-base disturbance  1839 XR Chest PA and Lateral My independent interpretation reveals fluid in the right fissure possibly secondary to remote partial lobectomy and possible pathologic posterior right fifth and 6th rib fracture/lesion  1848 Influenza A,B,COVID-19,NAAT neg  1848 Lactate: 1.2 reassuring  1856 Comprehensive Metabolic Panel(!) I interpreted the results of the CMP and note unremarkable electrolytes and LFTs, normal renal function   1909 PROCALCITONIN: <0.05 neg  1917 I discussed the patient's presentation, laboratory evaluation, and imaging results, if available with the on-call radiologies, and they feel the splaying of the sixth rib as well as the cortical irregularity of the fifth rib likely represent postoperative changes from her prior partial lobectomy and are not indicative of acute abnormality   1928 RSV: Not Detected neg     EXTERNAL RECORDS QUERIED AND REVIEWED ORPDMP reviewed for  concerning narcotic or sedative prescription patterns Immunization record reviewed as below: Immunization History  Administered Date(s) Administered  . COVID-19 (PFIZER) 12 YRS+, 0.3 ML (COMIRNATY) 09/07/2022, 08/11/2023  . COVID-19 (PFIZER) BIVALENT 12 YRS+, 0.3 ML 04/04/2022  . COVID-19 (PFIZER) MONOVALENT 12 YRS+ (ORIGINAL) 12/09/2019, 12/30/2019  . COVID-19 (PFIZER) MONOVALENT, 12 YRS+ (COMIRNATY) 12/09/2019, 12/30/2019, 08/20/2020, 03/06/2021, 09/02/2021  . INFLUENZA PF 65Y+ HIGH DOSE, TRIVALENT 09/27/2017, 08/20/2018  . INFLUENZA PF, QUADRIVALENT 08/15/2019  . INFLUENZA, UNSPECIFIED FORMULATION 08/16/2015, 08/23/2016, 09/26/2017, 08/20/2020, 09/02/2021, 08/11/2023  . PNEUMOCOCCAL CONJUGATE 20-VALENT (PCV20) 01/20/2022  . PNEUMOCOCCAL POLYSACCHARIDE 23-VALENT (PPSV23) 11/01/2016  . TDAP, (ADOL/ADULT) 07/09/2019, 04/04/2022  . ZOSTER  (ZOSTAVAX) LIVE 11/21/2014  . ZOSTER NON-LIVE [facility]) 01/20/2022, 04/04/2022   Patient admitted 07/29/2024 for left total hip arthroplasty in [place]    INDEPENDENT HISTORIAN adult child     INDEPENDENT DATA INTERPRETATION OR REVIEW RADIOLOGY INTERPRETATION: I independently interpreted the following studies as detailed in the ED course above.      RECENT RADIOLOGY RESULTS REVIEW Radiology results, if available, independently reviewed by myself:  XR Chest PA and Lateral Result Date: 09/25/2024 XR CHEST PA AND LATERAL 09/25/2024 6:31 PM PST COMPARISON: None. HISTORY: Shortness of breath. FINDINGS: Blunting of the right greater than left costophrenic angles and likely scarring of the right lung base.  Hazy interstitial lung markings bilaterally. Right chest port with central venous catheter tip terminating in the mid-lower SVC.  No pneumothorax or dense airspace consolidation.  Cardiac silhouette normal.   IMPRESSION:  Presumed scarring and questionable small pleural effusion of the right lung base with diffuse interstitial edema. Dictated by: Will G Larison, M.D. on 09/25/2024 6:33 PM PST  Electronically signed by: Will G Larison, M.D. on 09/25/2024 6:40 PM PST  X-ray hip left 2 or 3 views with or without pelvis Result Date: 09/10/2024  2 view x-rays AP, lateral of the left hip and pelvis ordered and taken today in clinic and images reviewed by myself show status post left total hip arthroplasty with components in appropriate position.   No evidence of periprosthetic loosening or fracture.        MEDICATION MANAGEMENT AND RISK    Medications administered in the ED: Medications - No data to display  IV fluids were:  considered though not administered as patient is eating and drinking adequately without volume losses through vomiting or diarrhea.  Medications prescribed on discharge from ED: New Prescriptions   No medications on file      Medication  reconciliation: I reviewed their medication list with them or their representative and recommended the following changes There are no discontinued medications.  Medications considered but not prescribed:  Over-the-counter medications such as Tylenol  and ibuprofen  recommended to assist with pain control explaining that studies indicate it is equivalent to narcotics.  Dosages of Tylenol  and ibuprofen  were provided and reviewed on discharge paperwork with the patient prior to discharge Antibiotics considered but not administered or prescribed as there was no clinical or objective evidence of bacterial infection. Antiviral medication were considered but I did not feel that they would benefit the patient and the risk of side effects would be more detrimental compared to the benefits the medication would provide. Over-the-counter medications for cough and cold were discussed with warning against excessive acetaminophen  dosing in combination medications and alpha agonists if contraindicated.     RISK INCREASED RISK- patient's age limits evaluation/patient understanding and creates increased element of risk    FINAL IMPRESSION AND DISPOSITION   The following ACUTE,  STABLE, OR CHRONIC PROBLEMS WITH EXACERBATION contributed to the patient's evaluation and workup today: 1. URI with cough and congestion   2. History of lobectomy of lung      DISPOSITION:  home in stable condition with return precautions discussed    ADDITIONAL HISTORIES    ALLERGIES   Allergies[4]   SOCIAL HISTORY  Social History[5]   PROCEDURES   Procedures     Disclaimer to patients reading my notes:  Please be advised that the primary purpose of this note is to communicate with myself and other members of your care team. Medical notes do not necessarily include everything discussed in a visit and are not intended for patient-physician communication. Medical abbreviations and medical terminology may be used. There  may be unintended grammatical and typographical errors. As long as the errors do not change the meaning of the record, they may remain in the record.        [1] Past Medical History: Diagnosis Date  . A-fib (HCC)   . Allergic rhinitis   . Arthritis   . Cervical spondylitis   . Colon cancer (HCC)   . DDD (degenerative disc disease), lumbar   . Depression   . GERD (gastroesophageal reflux disease)   . High cholesterol   . HTN (hypertension)   . Ischemic stroke (HCC)   . Lumbar spondylitis   . SVT (supraventricular tachycardia) (HCC)   [2] Past Surgical History: Procedure Laterality Date  . HIP ARTHROPLASTY Left   . LUNG REMOVAL, PARTIAL Right    middle lobe  [3] PTA Home Medications  Medication Sig  . amLODIPine  (NORVASC ) 10 MG tablet Take 1 tablet by mouth Daily.  . apixaban  (ELIQUIS ) 5 mg tablet Take 1 tablet by mouth 2 times daily.  . atorvaSTATin  (LIPITOR ) 40 mg tablet Take 1 tablet by mouth Daily.  . Biotin 1 MG CAPS Take by mouth.  . bisacodyl  (DULCOLAX) 5 mg EC tablet Take 1 tablet by mouth Daily as needed for Constipation.  . buPROPion  (WELLBUTRIN  SR) 150 mg 12 hr tablet Take 1 tablet by mouth 2 times daily.  . cholecalciferol 25 mcg (1,000 units) tablet Take 1 tablet by mouth Daily.  . fexofenadine (ALLEGRA) 180 mg tablet Take 1 tablet by mouth Daily.  . fluticasone  (FLONASE ) 50 mcg/nasal spray 1 spray by Each Nare route Daily.  . hydroCHLOROthiazide  12.5 MG tablet Take 1 tablet by mouth Daily.  . Multiple Vitamins-Minerals (OCUVITE ADULT 50+ PO) Take by mouth.  . pantoprazole  (PROTONIX ) 40 mg tablet Take 1 tablet by mouth 2 times daily (before meals).  . ramipril  (ALTACE ) 10 MG capsule Take 1 capsule by mouth Daily.  . sotalol  (BETAPACE ) 80 mg tablet Take 1 tablet by mouth 2 times daily.  . venlafaxine  (EFFEXOR  XR) 150 mg 24 hr capsule Take 1 capsule by mouth Daily.  [4] Allergies Allergen Reactions  . Strawberry Anaphylaxis and Nausea And Vomiting  .  Augmentin [Amoxicillin] Itching, Nausea And Vomiting and Headache  . Buprenorphine Itching, Nausea And Vomiting and Headache  . Codeine Itching and GI Upset  . Fentanyl  Itching, Nausea And Vomiting and Headache  . Ferrous Fumarate Nausea And Vomiting and Headache  . Meperidine Itching, Headache and GI Upset  . Morphine  Itching and Nausea And Vomiting  . Penicillins Diarrhea  . Percocet [Oxycodone ] Nausea And Vomiting and Headache  . Pollen Extract Hives, Itching and Swelling  . Tramadol GI Upset  . Latex Rash  . Nucynta [Tapentadol] Rash  [5] Social History Tobacco Use  .  Smoking status: Former    Types: Cigarettes  . Smokeless tobacco: Never  Substance Use Topics  . Alcohol use: Yes  . Drug use: Never   Mabel Manila, MD PhD 09/25/24 [phone]

## 2024-10-02 ENCOUNTER — Inpatient Hospital Stay

## 2024-10-02 ENCOUNTER — Other Ambulatory Visit: Payer: Self-pay

## 2024-10-02 ENCOUNTER — Inpatient Hospital Stay
Admission: EM | Admit: 2024-10-02 | Discharge: 2024-10-07 | DRG: 177 | Disposition: A | Discharge status: Home Health | Attending: Internal Medicine | Admitting: Internal Medicine

## 2024-10-02 ENCOUNTER — Emergency Department

## 2024-10-02 DIAGNOSIS — J189 Pneumonia, unspecified organism: Secondary | ICD-10-CM | POA: Diagnosis not present

## 2024-10-02 DIAGNOSIS — E876 Hypokalemia: Secondary | ICD-10-CM | POA: Diagnosis not present

## 2024-10-02 DIAGNOSIS — Z880 Allergy status to penicillin: Secondary | ICD-10-CM | POA: Diagnosis not present

## 2024-10-02 DIAGNOSIS — I10 Essential (primary) hypertension: Secondary | ICD-10-CM | POA: Diagnosis not present

## 2024-10-02 DIAGNOSIS — Z7901 Long term (current) use of anticoagulants: Secondary | ICD-10-CM | POA: Diagnosis not present

## 2024-10-02 DIAGNOSIS — I471 Supraventricular tachycardia, unspecified: Secondary | ICD-10-CM | POA: Diagnosis not present

## 2024-10-02 DIAGNOSIS — E872 Acidosis, unspecified: Secondary | ICD-10-CM | POA: Diagnosis not present

## 2024-10-02 DIAGNOSIS — Z803 Family history of malignant neoplasm of breast: Secondary | ICD-10-CM

## 2024-10-02 DIAGNOSIS — Z85118 Personal history of other malignant neoplasm of bronchus and lung: Secondary | ICD-10-CM

## 2024-10-02 DIAGNOSIS — R0689 Other abnormalities of breathing: Secondary | ICD-10-CM | POA: Diagnosis not present

## 2024-10-02 DIAGNOSIS — I110 Hypertensive heart disease with heart failure: Secondary | ICD-10-CM | POA: Diagnosis present

## 2024-10-02 DIAGNOSIS — R1310 Dysphagia, unspecified: Secondary | ICD-10-CM | POA: Diagnosis present

## 2024-10-02 DIAGNOSIS — I340 Nonrheumatic mitral (valve) insufficiency: Secondary | ICD-10-CM | POA: Diagnosis not present

## 2024-10-02 DIAGNOSIS — R062 Wheezing: Secondary | ICD-10-CM | POA: Diagnosis not present

## 2024-10-02 DIAGNOSIS — Z79899 Other long term (current) drug therapy: Secondary | ICD-10-CM

## 2024-10-02 DIAGNOSIS — J984 Other disorders of lung: Secondary | ICD-10-CM | POA: Diagnosis not present

## 2024-10-02 DIAGNOSIS — J9601 Acute respiratory failure with hypoxia: Secondary | ICD-10-CM | POA: Diagnosis not present

## 2024-10-02 DIAGNOSIS — J9602 Acute respiratory failure with hypercapnia: Secondary | ICD-10-CM | POA: Diagnosis present

## 2024-10-02 DIAGNOSIS — J188 Other pneumonia, unspecified organism: Secondary | ICD-10-CM

## 2024-10-02 DIAGNOSIS — I5033 Acute on chronic diastolic (congestive) heart failure: Secondary | ICD-10-CM | POA: Diagnosis present

## 2024-10-02 DIAGNOSIS — R739 Hyperglycemia, unspecified: Secondary | ICD-10-CM | POA: Diagnosis not present

## 2024-10-02 DIAGNOSIS — I161 Hypertensive emergency: Secondary | ICD-10-CM | POA: Diagnosis not present

## 2024-10-02 DIAGNOSIS — U071 COVID-19: Principal | ICD-10-CM | POA: Diagnosis present

## 2024-10-02 DIAGNOSIS — Z881 Allergy status to other antibiotic agents status: Secondary | ICD-10-CM

## 2024-10-02 DIAGNOSIS — I4891 Unspecified atrial fibrillation: Secondary | ICD-10-CM | POA: Diagnosis present

## 2024-10-02 DIAGNOSIS — Z888 Allergy status to other drugs, medicaments and biological substances status: Secondary | ICD-10-CM

## 2024-10-02 DIAGNOSIS — J810 Acute pulmonary edema: Secondary | ICD-10-CM

## 2024-10-02 DIAGNOSIS — Z818 Family history of other mental and behavioral disorders: Secondary | ICD-10-CM

## 2024-10-02 DIAGNOSIS — J1282 Pneumonia due to coronavirus disease 2019: Secondary | ICD-10-CM | POA: Diagnosis not present

## 2024-10-02 DIAGNOSIS — G9340 Encephalopathy, unspecified: Secondary | ICD-10-CM | POA: Diagnosis not present

## 2024-10-02 DIAGNOSIS — E785 Hyperlipidemia, unspecified: Secondary | ICD-10-CM | POA: Insufficient documentation

## 2024-10-02 DIAGNOSIS — Z9221 Personal history of antineoplastic chemotherapy: Secondary | ICD-10-CM | POA: Diagnosis not present

## 2024-10-02 DIAGNOSIS — F329 Major depressive disorder, single episode, unspecified: Secondary | ICD-10-CM | POA: Diagnosis not present

## 2024-10-02 DIAGNOSIS — Z811 Family history of alcohol abuse and dependence: Secondary | ICD-10-CM

## 2024-10-02 DIAGNOSIS — R918 Other nonspecific abnormal finding of lung field: Secondary | ICD-10-CM | POA: Diagnosis not present

## 2024-10-02 DIAGNOSIS — C159 Malignant neoplasm of esophagus, unspecified: Secondary | ICD-10-CM | POA: Diagnosis present

## 2024-10-02 DIAGNOSIS — I2489 Other forms of acute ischemic heart disease: Secondary | ICD-10-CM | POA: Diagnosis present

## 2024-10-02 DIAGNOSIS — F32A Depression, unspecified: Secondary | ICD-10-CM | POA: Diagnosis present

## 2024-10-02 DIAGNOSIS — E1165 Type 2 diabetes mellitus with hyperglycemia: Secondary | ICD-10-CM | POA: Diagnosis not present

## 2024-10-02 DIAGNOSIS — Z8673 Personal history of transient ischemic attack (TIA), and cerebral infarction without residual deficits: Secondary | ICD-10-CM

## 2024-10-02 DIAGNOSIS — K5900 Constipation, unspecified: Secondary | ICD-10-CM | POA: Diagnosis not present

## 2024-10-02 DIAGNOSIS — K219 Gastro-esophageal reflux disease without esophagitis: Secondary | ICD-10-CM | POA: Diagnosis present

## 2024-10-02 DIAGNOSIS — Z91040 Latex allergy status: Secondary | ICD-10-CM

## 2024-10-02 DIAGNOSIS — Z96642 Presence of left artificial hip joint: Secondary | ICD-10-CM | POA: Diagnosis present

## 2024-10-02 DIAGNOSIS — K439 Ventral hernia without obstruction or gangrene: Secondary | ICD-10-CM | POA: Diagnosis present

## 2024-10-02 DIAGNOSIS — E7849 Other hyperlipidemia: Secondary | ICD-10-CM | POA: Diagnosis present

## 2024-10-02 DIAGNOSIS — Z85038 Personal history of other malignant neoplasm of large intestine: Secondary | ICD-10-CM

## 2024-10-02 DIAGNOSIS — Z923 Personal history of irradiation: Secondary | ICD-10-CM | POA: Diagnosis not present

## 2024-10-02 DIAGNOSIS — Z87891 Personal history of nicotine dependence: Secondary | ICD-10-CM

## 2024-10-02 DIAGNOSIS — R0602 Shortness of breath: Secondary | ICD-10-CM | POA: Diagnosis not present

## 2024-10-02 DIAGNOSIS — Z8659 Personal history of other mental and behavioral disorders: Secondary | ICD-10-CM

## 2024-10-02 DIAGNOSIS — Z8249 Family history of ischemic heart disease and other diseases of the circulatory system: Secondary | ICD-10-CM | POA: Diagnosis not present

## 2024-10-02 DIAGNOSIS — Z8501 Personal history of malignant neoplasm of esophagus: Secondary | ICD-10-CM

## 2024-10-02 DIAGNOSIS — I480 Paroxysmal atrial fibrillation: Secondary | ICD-10-CM | POA: Diagnosis not present

## 2024-10-02 DIAGNOSIS — R0603 Acute respiratory distress: Secondary | ICD-10-CM | POA: Diagnosis not present

## 2024-10-02 DIAGNOSIS — Z823 Family history of stroke: Secondary | ICD-10-CM

## 2024-10-02 DIAGNOSIS — Z90710 Acquired absence of both cervix and uterus: Secondary | ICD-10-CM

## 2024-10-02 DIAGNOSIS — Z885 Allergy status to narcotic agent status: Secondary | ICD-10-CM

## 2024-10-02 LAB — BLOOD GAS, ARTERIAL
Acid-base deficit: 8.9 mmol/L — ABNORMAL HIGH (ref 0.0–2.0)
Acid-base deficit: 3.1 mmol/L — ABNORMAL HIGH (ref 0.0–2.0)
Bicarbonate: 20.6 mmol/L (ref 20.0–28.0)
Bicarbonate: 23.2 mmol/L (ref 20.0–28.0)
Delivery systems: POSITIVE
Delivery systems: POSITIVE
FIO2: 100 %
FIO2: 100 %
O2 Saturation: 100 %
O2 Saturation: 100 %
PEEP: 5 cmH2O
PEEP: 5 cmH2O
Patient temperature: 37
Patient temperature: 37
Pressure support: 8 cmH2O
Pressure support: 8 cmH2O
pCO2 arterial: 59 mmHg — ABNORMAL HIGH (ref 32–48)
pCO2 arterial: 45 mmHg (ref 32–48)
pH, Arterial: 7.15 — CL (ref 7.35–7.45)
pH, Arterial: 7.32 — ABNORMAL LOW (ref 7.35–7.45)
pO2, Arterial: 310 mmHg — ABNORMAL HIGH (ref 83–108)
pO2, Arterial: 327 mmHg — ABNORMAL HIGH (ref 83–108)

## 2024-10-02 LAB — RESP PANEL BY RT-PCR (RSV, FLU A&B, COVID)  RVPGX2
Influenza A by PCR: NEGATIVE
Influenza B by PCR: NEGATIVE
Resp Syncytial Virus by PCR: NEGATIVE
SARS Coronavirus 2 by RT PCR: POSITIVE — AB

## 2024-10-02 LAB — BLOOD GAS, VENOUS
Acid-Base Excess: 4.4 mmol/L — ABNORMAL HIGH (ref 0.0–2.0)
Bicarbonate: 30.8 mmol/L — ABNORMAL HIGH (ref 20.0–28.0)
O2 Saturation: 51.3 %
Patient temperature: 37
pCO2, Ven: 52 mmHg (ref 44–60)
pH, Ven: 7.38 (ref 7.25–7.43)
pO2, Ven: 34 mmHg (ref 32–45)

## 2024-10-02 LAB — COOXEMETRY PANEL
Carboxyhemoglobin: 1.8 % — ABNORMAL HIGH (ref 0.5–1.5)
Methemoglobin: <0.7 % (ref 0.0–1.5)
O2 Saturation: 93.1 %
Total hemoglobin: 13.4 g/dL (ref 12.0–16.0)
Total oxygen content: 90.9 %

## 2024-10-02 LAB — TROPONIN T, HIGH SENSITIVITY
Troponin T High Sensitivity: 74 ng/L — ABNORMAL HIGH (ref 0–19)
Troponin T High Sensitivity: 67 ng/L — ABNORMAL HIGH (ref 0–19)

## 2024-10-02 LAB — COMPREHENSIVE METABOLIC PANEL WITH GFR
ALT: 23 U/L (ref 0–44)
ALT: 55 U/L — ABNORMAL HIGH (ref 0–44)
AST: 28 U/L (ref 15–41)
AST: 72 U/L — ABNORMAL HIGH (ref 15–41)
Albumin: 3.9 g/dL (ref 3.5–5.0)
Albumin: 3.9 g/dL (ref 3.5–5.0)
Alkaline Phosphatase: 114 U/L (ref 38–126)
Alkaline Phosphatase: 158 U/L — ABNORMAL HIGH (ref 38–126)
Anion gap: 13 (ref 5–15)
Anion gap: 17 — ABNORMAL HIGH (ref 5–15)
BUN: 17 mg/dL (ref 8–23)
BUN: 17 mg/dL (ref 8–23)
CO2: 27 mmol/L (ref 22–32)
CO2: 21 mmol/L — ABNORMAL LOW (ref 22–32)
Calcium: 8.6 mg/dL — ABNORMAL LOW (ref 8.9–10.3)
Calcium: 8.3 mg/dL — ABNORMAL LOW (ref 8.9–10.3)
Chloride: 100 mmol/L (ref 98–111)
Chloride: 101 mmol/L (ref 98–111)
Creatinine, Ser: 0.85 mg/dL (ref 0.44–1.00)
Creatinine, Ser: 1.01 mg/dL — ABNORMAL HIGH (ref 0.44–1.00)
GFR, Estimated: >60 mL/min (ref >60)
GFR, Estimated: 55 mL/min — ABNORMAL LOW (ref >60)
Glucose, Bld: 218 mg/dL — ABNORMAL HIGH (ref 70–99)
Glucose, Bld: 377 mg/dL — ABNORMAL HIGH (ref 70–99)
Potassium: 2.9 mmol/L — ABNORMAL LOW (ref 3.5–5.1)
Potassium: 4 mmol/L (ref 3.5–5.1)
Sodium: 141 mmol/L (ref 135–145)
Sodium: 138 mmol/L (ref 135–145)
Total Bilirubin: 0.5 mg/dL (ref 0.0–1.2)
Total Bilirubin: 0.4 mg/dL (ref 0.0–1.2)
Total Protein: 7.1 g/dL (ref 6.5–8.1)
Total Protein: 8 g/dL (ref 6.5–8.1)

## 2024-10-02 LAB — PROTIME-INR
INR: 1.1 (ref 0.8–1.2)
Prothrombin Time: 14.6 s (ref 11.4–15.2)

## 2024-10-02 LAB — CBC WITH DIFFERENTIAL/PLATELET
Abs Immature Granulocytes: 0.05 K/uL (ref 0.00–0.07)
Abs Immature Granulocytes: 0.03 K/uL (ref 0.00–0.07)
Basophils Absolute: 0 K/uL (ref 0.0–0.1)
Basophils Absolute: 0 K/uL (ref 0.0–0.1)
Basophils Relative: 1 %
Basophils Relative: 0 %
Eosinophils Absolute: 0.1 K/uL (ref 0.0–0.5)
Eosinophils Absolute: 0 K/uL (ref 0.0–0.5)
Eosinophils Relative: 2 %
Eosinophils Relative: 0 %
HCT: 38.6 % (ref 36.0–46.0)
HCT: 40.5 % (ref 36.0–46.0)
Hemoglobin: 12.3 g/dL (ref 12.0–15.0)
Hemoglobin: 12.5 g/dL (ref 12.0–15.0)
Immature Granulocytes: 1 %
Immature Granulocytes: 1 %
Lymphocytes Relative: 19 %
Lymphocytes Relative: 9 %
Lymphs Abs: 1.4 K/uL (ref 0.7–4.0)
Lymphs Abs: 0.5 K/uL — ABNORMAL LOW (ref 0.7–4.0)
MCH: 31.5 pg (ref 26.0–34.0)
MCH: 31 pg (ref 26.0–34.0)
MCHC: 31.9 g/dL (ref 30.0–36.0)
MCHC: 30.9 g/dL (ref 30.0–36.0)
MCV: 98.7 fL (ref 80.0–100.0)
MCV: 100.5 fL — ABNORMAL HIGH (ref 80.0–100.0)
Monocytes Absolute: 0.5 K/uL (ref 0.1–1.0)
Monocytes Absolute: 0 K/uL — ABNORMAL LOW (ref 0.1–1.0)
Monocytes Relative: 7 %
Monocytes Relative: 1 %
Neutro Abs: 5.6 K/uL (ref 1.7–7.7)
Neutro Abs: 5.2 K/uL (ref 1.7–7.7)
Neutrophils Relative %: 70 %
Neutrophils Relative %: 89 %
Platelets: 306 K/uL (ref 150–400)
Platelets: 348 K/uL (ref 150–400)
RBC: 3.91 MIL/uL (ref 3.87–5.11)
RBC: 4.03 MIL/uL (ref 3.87–5.11)
RDW: 14.1 % (ref 11.5–15.5)
RDW: 14 % (ref 11.5–15.5)
WBC: 7.7 K/uL (ref 4.0–10.5)
WBC: 5.7 K/uL (ref 4.0–10.5)
nRBC: 0 % (ref 0.0–0.2)
nRBC: 0 % (ref 0.0–0.2)

## 2024-10-02 LAB — PRO BRAIN NATRIURETIC PEPTIDE: Pro Brain Natriuretic Peptide: 4519 pg/mL — ABNORMAL HIGH (ref <300.0)

## 2024-10-02 LAB — FIBRINOGEN: Fibrinogen: 536 mg/dL — ABNORMAL HIGH (ref 210–475)

## 2024-10-02 LAB — PHOSPHORUS: Phosphorus: 6.2 mg/dL — ABNORMAL HIGH (ref 2.5–4.6)

## 2024-10-02 LAB — MRSA NEXT GEN BY PCR, NASAL: MRSA by PCR Next Gen: DETECTED — AB

## 2024-10-02 LAB — PROCALCITONIN: Procalcitonin: <0.1 ng/mL

## 2024-10-02 LAB — MAGNESIUM: Magnesium: 2 mg/dL (ref 1.7–2.4)

## 2024-10-02 LAB — GLUCOSE, CAPILLARY
Glucose-Capillary: 188 mg/dL — ABNORMAL HIGH (ref 70–99)
Glucose-Capillary: 284 mg/dL — ABNORMAL HIGH (ref 70–99)

## 2024-10-02 MED ORDER — NITROGLYCERIN 2 % TD OINT
1.0000 [in_us] | TOPICAL_OINTMENT | Freq: Four times a day (QID) | TRANSDERMAL | Status: DC
  Administered 2024-10-02: 1 [in_us] via TOPICAL
  Filled 2024-10-02: qty 1

## 2024-10-02 MED ORDER — OXYCODONE HCL 5 MG PO TABS
2.5000 mg | ORAL_TABLET | Freq: Four times a day (QID) | ORAL | Status: DC | PRN
  Administered 2024-10-03 (×2): 2.5 mg via ORAL
  Filled 2024-10-02 (×2): qty 1

## 2024-10-02 MED ORDER — DILTIAZEM HCL 30 MG PO TABS
30.0000 mg | ORAL_TABLET | Freq: Every day | ORAL | Status: DC
  Administered 2024-10-03 – 2024-10-07 (×5): 30 mg via ORAL
  Filled 2024-10-02 (×5): qty 1

## 2024-10-02 MED ORDER — ENOXAPARIN SODIUM 40 MG/0.4ML IJ SOSY: 40.0000 mg | PREFILLED_SYRINGE | INTRAMUSCULAR | Status: DC

## 2024-10-02 MED ORDER — FUROSEMIDE 10 MG/ML IJ SOLN
80.0000 mg | Freq: Once | INTRAMUSCULAR | Status: AC
  Administered 2024-10-02: 80 mg via INTRAVENOUS
  Filled 2024-10-02: qty 8

## 2024-10-02 MED ORDER — PANTOPRAZOLE SODIUM 40 MG PO TBEC
40.0000 mg | DELAYED_RELEASE_TABLET | Freq: Two times a day (BID) | ORAL | Status: DC
  Administered 2024-10-03 – 2024-10-07 (×10): 40 mg via ORAL
  Filled 2024-10-02 (×10): qty 1

## 2024-10-02 MED ORDER — ONDANSETRON HCL 4 MG/2ML IJ SOLN: 4.0000 mg | Freq: Four times a day (QID) | INTRAMUSCULAR | Status: AC | PRN

## 2024-10-02 MED ORDER — RAMIPRIL 5 MG PO CAPS
10.0000 mg | ORAL_CAPSULE | ORAL | Status: DC
Start: 2024-10-03 — End: 2024-10-02

## 2024-10-02 MED ORDER — ATORVASTATIN CALCIUM 20 MG PO TABS
40.0000 mg | ORAL_TABLET | Freq: Every day | ORAL | Status: DC
  Administered 2024-10-03 – 2024-10-06 (×5): 40 mg via ORAL
  Filled 2024-10-02 (×5): qty 2

## 2024-10-02 MED ORDER — ONDANSETRON HCL 4 MG PO TABS
4.0000 mg | ORAL_TABLET | Freq: Four times a day (QID) | ORAL | Status: AC | PRN
Start: 2024-10-02 — End: 2024-10-07
  Administered 2024-10-04: 4 mg via ORAL
  Filled 2024-10-02: qty 1

## 2024-10-02 MED ORDER — LABETALOL HCL 5 MG/ML IV SOLN: 20.0000 mg | INTRAVENOUS | Status: DC | PRN

## 2024-10-02 MED ORDER — IPRATROPIUM-ALBUTEROL 0.5-2.5 (3) MG/3ML IN SOLN
3.0000 mL | Freq: Four times a day (QID) | RESPIRATORY_TRACT | Status: AC
  Administered 2024-10-02 – 2024-10-03 (×3): 3 mL via RESPIRATORY_TRACT
  Filled 2024-10-02 (×2): qty 3

## 2024-10-02 MED ORDER — SOTALOL HCL 80 MG PO TABS
80.0000 mg | ORAL_TABLET | Freq: Two times a day (BID) | ORAL | Status: DC
  Administered 2024-10-03 – 2024-10-07 (×9): 80 mg via ORAL
  Filled 2024-10-02 (×10): qty 1

## 2024-10-02 MED ORDER — APIXABAN 5 MG PO TABS
5.0000 mg | ORAL_TABLET | Freq: Two times a day (BID) | ORAL | Status: DC
  Administered 2024-10-03 – 2024-10-07 (×10): 5 mg via ORAL
  Filled 2024-10-02 (×10): qty 1

## 2024-10-02 MED ORDER — DEXMEDETOMIDINE HCL IN NACL 400 MCG/100ML IV SOLN
0.0000 ug/kg/h | INTRAVENOUS | Status: DC
Start: 2024-10-02 — End: 2024-10-03
  Administered 2024-10-02: 0.4 ug/kg/h via INTRAVENOUS
  Filled 2024-10-02: qty 100

## 2024-10-02 MED ORDER — BUSPIRONE HCL 5 MG PO TABS: 7.5000 mg | ORAL_TABLET | Freq: Two times a day (BID) | ORAL | Status: DC

## 2024-10-02 MED ORDER — POTASSIUM CHLORIDE 10 MEQ/100ML IV SOLN
10.0000 meq | INTRAVENOUS | Status: AC
  Administered 2024-10-02 (×2): 10 meq via INTRAVENOUS
  Filled 2024-10-02: qty 100

## 2024-10-02 MED ORDER — VENLAFAXINE HCL ER 75 MG PO CP24
150.0000 mg | ORAL_CAPSULE | Freq: Every day | ORAL | Status: DC
  Administered 2024-10-03 – 2024-10-07 (×5): 150 mg via ORAL
  Filled 2024-10-02 (×5): qty 2

## 2024-10-02 MED ORDER — HYDRALAZINE HCL 20 MG/ML IJ SOLN
5.0000 mg | Freq: Four times a day (QID) | INTRAMUSCULAR | Status: DC | PRN
  Administered 2024-10-02: 5 mg via INTRAVENOUS
  Filled 2024-10-02: qty 1

## 2024-10-02 MED ORDER — SODIUM CHLORIDE 0.9 % IV SOLN
500.0000 mg | INTRAVENOUS | Status: DC
  Administered 2024-10-03: 500 mg via INTRAVENOUS
  Filled 2024-10-02: qty 5

## 2024-10-02 MED ORDER — SODIUM CHLORIDE 0.9 % IV SOLN
500.0000 mg | Freq: Once | INTRAVENOUS | Status: AC
  Administered 2024-10-02: 500 mg via INTRAVENOUS
  Filled 2024-10-02: qty 5

## 2024-10-02 MED ORDER — METHYLPREDNISOLONE SODIUM SUCC 125 MG IJ SOLR
125.0000 mg | Freq: Once | INTRAMUSCULAR | Status: AC
  Administered 2024-10-02: 125 mg via INTRAVENOUS
  Filled 2024-10-02: qty 2

## 2024-10-02 MED ORDER — ACETAMINOPHEN 325 MG PO TABS
650.0000 mg | ORAL_TABLET | Freq: Four times a day (QID) | ORAL | Status: AC | PRN
  Administered 2024-10-03 – 2024-10-04 (×3): 650 mg via ORAL
  Filled 2024-10-02 (×3): qty 2

## 2024-10-02 MED ORDER — MORPHINE SULFATE (PF) 2 MG/ML IV SOLN
2.0000 mg | Freq: Once | INTRAVENOUS | Status: AC
  Administered 2024-10-02: 2 mg via INTRAVENOUS
  Filled 2024-10-02: qty 1

## 2024-10-02 MED ORDER — SODIUM CHLORIDE 0.9 % IV SOLN
1.0000 g | Freq: Once | INTRAVENOUS | Status: DC
  Administered 2024-10-02: 1 g via INTRAVENOUS
  Filled 2024-10-02: qty 10

## 2024-10-02 MED ORDER — POTASSIUM CHLORIDE 20 MEQ PO PACK
40.0000 meq | PACK | Freq: Once | ORAL | Status: AC
  Administered 2024-10-02: 40 meq via ORAL
  Filled 2024-10-02: qty 2

## 2024-10-02 MED ORDER — DOCUSATE SODIUM 100 MG PO CAPS
100.0000 mg | ORAL_CAPSULE | Freq: Two times a day (BID) | ORAL | Status: DC
  Administered 2024-10-03 – 2024-10-05 (×6): 100 mg via ORAL
  Filled 2024-10-02 (×7): qty 1

## 2024-10-02 MED ORDER — SODIUM CHLORIDE 0.9 % IV SOLN
2.0000 g | INTRAVENOUS | Status: DC
  Filled 2024-10-02: qty 20

## 2024-10-02 MED ORDER — METHYLPREDNISOLONE SODIUM SUCC 40 MG IJ SOLR: 40.0000 mg | Freq: Every day | INTRAMUSCULAR | Status: DC

## 2024-10-02 MED ORDER — CHLORHEXIDINE GLUCONATE CLOTH 2 % EX PADS
6.0000 | MEDICATED_PAD | Freq: Every day | CUTANEOUS | Status: DC
  Administered 2024-10-02 – 2024-10-03 (×2): 6 via TOPICAL

## 2024-10-02 MED ORDER — ACETAMINOPHEN 650 MG RE SUPP: 650.0000 mg | Freq: Four times a day (QID) | RECTAL | Status: AC | PRN

## 2024-10-02 MED ORDER — INSULIN ASPART 100 UNIT/ML IJ SOLN
0.0000 [IU] | INTRAMUSCULAR | Status: DC
  Administered 2024-10-03: 2 [IU] via SUBCUTANEOUS
  Administered 2024-10-03: 1 [IU] via SUBCUTANEOUS
  Administered 2024-10-03: 3 [IU] via SUBCUTANEOUS
  Administered 2024-10-03: 5 [IU] via SUBCUTANEOUS
  Administered 2024-10-03 – 2024-10-04 (×2): 1 [IU] via SUBCUTANEOUS
  Administered 2024-10-04 (×2): 2 [IU] via SUBCUTANEOUS
  Administered 2024-10-05: 5 [IU] via SUBCUTANEOUS
  Administered 2024-10-05: 2 [IU] via SUBCUTANEOUS
  Administered 2024-10-06: 3 [IU] via SUBCUTANEOUS
  Administered 2024-10-06 – 2024-10-07 (×3): 2 [IU] via SUBCUTANEOUS
  Filled 2024-10-02: qty 1
  Filled 2024-10-02 (×3): qty 2
  Filled 2024-10-02: qty 1
  Filled 2024-10-02: qty 5
  Filled 2024-10-02: qty 3
  Filled 2024-10-02 (×3): qty 2
  Filled 2024-10-02: qty 5
  Filled 2024-10-02 (×2): qty 2
  Filled 2024-10-02: qty 1

## 2024-10-02 MED ORDER — FUROSEMIDE 20 MG PO TABS
20.0000 mg | ORAL_TABLET | ORAL | Status: DC
  Administered 2024-10-03: 20 mg via ORAL
  Filled 2024-10-02: qty 1

## 2024-10-02 NOTE — Progress Notes (Signed)
 eLink Physician-Brief Progress Note Patient Name: Kelly Krause DOB: [birth year] MRN: [id number]   Date of Service  10/02/2024  HPI/Events of Note  Patient admitted with bilateral pneumonia, acute hypoxemic respiratory failure, and positive Covid test. She is currently on BIPAP.  eICU Interventions  New Patient Evaluation.        Aaradhya Kysar U Lavera Vandermeer 10/02/2024, 9:19 PM

## 2024-10-02 NOTE — Assessment & Plan Note (Signed)
 Home buspirone  7.5 mg p.o. twice daily, venlafaxine  150 mg daily with breakfast resumed

## 2024-10-02 NOTE — Progress Notes (Signed)
 MD at bedside. Rapid called on pt for BP 216/122 and HR of 128 and pt struggling to breath with o2 at 90% on 2L [place]. Patient had labored breathing and wheezing. Pt given breathing treatment by respiratory.

## 2024-10-02 NOTE — Assessment & Plan Note (Signed)
 Home ramipril  10 mg daily, diltiazem 30 mg daily resumed on admission Hydralazine  5 mg IV every 6 hours as needed for SBP greater 170, 5 days

## 2024-10-02 NOTE — ED Notes (Signed)
 Called CCMD to add pt to monitoring.

## 2024-10-02 NOTE — H&P (Signed)
 History and Physical   Kelly Krause FMW:[id number] DOB: Feb 06, 1941 DOA: 10/02/2024  PCP: Bertrum Charlie CROME, MD  Outpatient Specialists: Dr. Onita, gastroenterology Patient coming from: Home via EMS  I have personally briefly reviewed patient's old medical records in [facility] EMR.  Chief Concern: Shortness of breath  HPI: Ms. Kelly Krause is a 83 year old female with history of atrial fibrillation on Eliquis , hyperlipidemia, depression, hypertension, GERD, who presents ED for chief concerns of shortness of breath.  Per ED documentation: Patient had SpO2 of 85% via EMS arrival with wheezing.  Patient was given DuoNeb by EMS.  Vitals in the ED showed t of 97.8, rr 18, hr 84, blood pressure 156/69, SpO2 was stated at 85% on room air and patient was placed on nasal cannula with improvement.  Serum sodium is 141, potassium 2.9, chloride 100, bicarb 27, BUN of 17, serum creatinine 0.85, eGFR greater than 60, nonfasting blood glucose 216, WBC 7.7, hemoglobin 12.3, platelet 306.  COVID tested positive by PCR.  Influenza A/influenza B/RSV PCR were negative.  BNP was 4519.  HS troponin was 74.  ED treatment: Potassium chloride  10 mEq IV x 2, ceftriaxone 1 g IV one-time dose, azithromycin 500 mg IV one-time dose. --------------------------------- At bedside, patient is able to tell me her first last name, her age, location, current calendar year.  She reports she has been coughing for about 1 week.  She reports the cough is productive of green sputum.  She reports that she did not come to the ED until today because she has been traveling.  She was initially in [place]  and then [place]  visiting her son.  She states that she went to the ED in [place]  and had an x-ray done and labs and no further treatments.  She denies fever, chest pain, dysuria, hematuria, diarrhea.  Social history: She lives at home.  She denies tobacco and recreational drug use.  She infrequently drinks EtOH.   Does not remember the last drink.  She is retired and previously worked as a production designer, theatre/television/film.  ROS: Constitutional: no weight change, no fever ENT/Mouth: no sore throat, no rhinorrhea Eyes: no eye pain, no vision changes Cardiovascular: no chest pain, + dyspnea,  no edema, no palpitations Respiratory: + cough, + green sputum, no wheezing Gastrointestinal: no nausea, no vomiting, no diarrhea, no constipation Genitourinary: no urinary incontinence, no dysuria, no hematuria Musculoskeletal: no arthralgias, no myalgias Skin: no skin lesions, no pruritus, Neuro: + weakness, no loss of consciousness, no syncope Psych: no anxiety, no depression, + decrease appetite Heme/Lymph: no bruising, no bleeding  ED Course: Discussed with EDP, patient requiring hospitalization for chief concerns of acute hypoxic respiratory failure.  Assessment/Plan  Principal Problem:   Acute hypoxic respiratory failure (HCC) Active Problems:   Ventral hernia   Familial multiple lipoprotein-type hyperlipidemia   Clinical depression   Acid reflux   Essential (primary) hypertension   SCC (squamous cell carcinoma of esophagus) (HCC)   Atrial fibrillation (HCC)   Malignant neoplasm of esophagus (HCC)   Hyperlipidemia   Assessment and Plan:  * Acute hypoxic respiratory failure (HCC) Etiology is multifactorial in setting of multilobar pneumonia along with COVID positive respiratory infection Check MRSA PCR, Legionella urine antigen Continue with ceftriaxone 2 g IV daily, azithromycin 500 mg IV daily to complete a 5-day course Incentive spirometry, flutter valve Maintain SpO2 greater than 92%  Ventral hernia Lower abdominal ventral hernia is present Patient reports she just noticed that about 1 to 2 weeks ago. Patient has evidence of  intra-abdominal surgery The hernia is reproducible and does not cause pain Counseled patient to leave it alone and only make PCP with possible referral to general surgery if the  hernia causes pain  Hyperlipidemia Home atorvastatin  40 mg nightly resumed  Atrial fibrillation (HCC) Home Eliquis  5 mg p.o. twice daily, sotalol  80 mg p.o. twice daily, diltiazem 30 mg resumed on admission The above medication was  reviewed based on outpatient cardiology clinic from 04/01/2024  Essential (primary) hypertension Home ramipril  10 mg daily, diltiazem 30 mg daily resumed on admission Hydralazine  5 mg IV every 6 hours as needed for SBP greater 170, 5 days  Acid reflux Home PPI twice daily resumed  Clinical depression Venlafaxine  150 mg daily with breakfast and buspirone  7.5 mg p.o. twice daily were resumed on  H/O: depression-resolved as of 10/02/2024 Home buspirone  7.5 mg p.o. twice daily, venlafaxine  150 mg daily with breakfast resumed  Chart reviewed.   DVT prophylaxis: Eliquis  Code Status: full code  Diet: Heart healthy Family Communication: Updated daughter at bedside with patient's permission Disposition Plan: Pending clinical course Consults called: none at this time  Admission status: telemetry, inpatient   Past Medical History:  Diagnosis Date   Allergy    Anticoagulated on apixaban     Atrial fibrillation (HCC)    Colon cancer (HCC)    chemo/rad 15 yrs   DDD (degenerative disc disease), cervical    DM (diabetes mellitus), type 2 (HCC)    controlled no medications   Elevated lipids    Esophageal cancer (HCC)    GERD (gastroesophageal reflux disease)    History of blood transfusion    Hyperlipidemia    Hypertension    Lung cancer (HCC)    chemo   Major depressive disorder, recurrent episode, moderate (HCC)    Osteoarthritis of left hip    Persistent disorder of initiating or maintaining sleep    Personal history of chemotherapy    Personal history of radiation therapy    colon   Stroke (HCC) 06/03/2021   No Deficits   SVT (supraventricular tachycardia)    Past Surgical History:  Procedure Laterality Date   ABDOMINAL HYSTERECTOMY      ATRIAL FIBRILLATION ABLATION     BROW LIFT Bilateral 01/07/2022   Procedure: BLEPHAROPLASTY UPPER EYELID; W/ EXCESS SKIN BILATERAL;  Surgeon: Ashley Greig HERO, MD;  Location: [facility];  Service: Ophthalmology;  Laterality: Bilateral;   BUNIONECTOMY     CAPSULOTOMY METATARSOPHALANGEAL Left 04/19/2017   Procedure: CAPSULOTOMY METATARSOPHALANGEAL;  Surgeon: Ashley Soulier, DPM;  Location: [facility];  Service: Podiatry;  Laterality: Left;   CATARACT EXTRACTION W/PHACO Right 08/16/2021   Procedure: CATARACT EXTRACTION PHACO AND INTRAOCULAR LENS PLACEMENT (IOC) RIGHT Eyhance Toric;  Surgeon: Myrna Adine Anes, MD;  Location: [facility];  Service: Ophthalmology;  Laterality: Right;  5.02 00:33.8   CATARACT EXTRACTION W/PHACO Left 08/30/2021   Procedure: CATARACT EXTRACTION PHACO AND INTRAOCULAR LENS PLACEMENT (IOC) LEFT 5.10 00:43.4;  Surgeon: Myrna Adine Anes, MD;  Location: [facility];  Service: Ophthalmology;  Laterality: Left;   COLECTOMY     COLONOSCOPY N/A 06/28/2024   Procedure: COLONOSCOPY;  Surgeon: Onita Elspeth Sharper, DO;  Location: [facility] ENDOSCOPY;  Service: Gastroenterology;  Laterality: N/A;   COLONOSCOPY WITH PROPOFOL  N/A 09/26/2017   Procedure: COLONOSCOPY WITH PROPOFOL ;  Surgeon: Gaylyn Gladis PENNER, MD;  Location: [facility] ENDOSCOPY;  Service: Endoscopy;  Laterality: N/A;   ESOPHAGOGASTRODUODENOSCOPY N/A 04/21/2015   Procedure: ESOPHAGOGASTRODUODENOSCOPY (EGD);  Surgeon: Gladis PENNER Gaylyn, MD;  Location: [facility]  ENDOSCOPY;  Service: Endoscopy;  Laterality: N/A;   Esophgeal cancer     HAMMER TOE SURGERY Left 04/19/2017   Procedure: HAMMER TOE CORRECTION-3RD & 4TH;  Surgeon: Ashley Soulier, DPM;  Location: [facility] [place] [facility];  Service: Podiatry;  Laterality: Left;   LOBECTOMY Right    METATARSAL OSTEOTOMY Left 04/19/2017   Procedure: PHALANX OSTEOTOMY-AKIN - LEFT;  Surgeon: Ashley Soulier, DPM;  Location: [facility];  Service: Podiatry;   Laterality: Left;   POLYPECTOMY  06/28/2024   Procedure: POLYPECTOMY, INTESTINE;  Surgeon: Onita Elspeth Sharper, DO;  Location: [facility] ENDOSCOPY;  Service: Gastroenterology;;   SINUSOTOMY     TOTAL HIP ARTHROPLASTY Left 07/29/2024   Procedure: ARTHROPLASTY, HIP, TOTAL, ANTERIOR APPROACH;  Surgeon: Lorelle Hussar, MD;  Location: [facility] ORS;  Service: Orthopedics;  Laterality: Left;   Social History:  reports that she has quit smoking. Her smoking use included cigarettes. She has never used smokeless tobacco. She reports current alcohol use. She reports that she does not use drugs.  Allergies  Allergen Reactions   Amoxicillin-Pot Clavulanate Itching, Nausea Only and Other (See Comments)    Has patient had a PCN reaction causing immediate rash, facial/tongue/throat swelling, SOB or lightheadedness with hypotension: No Has patient had a PCN reaction causing severe rash involving mucus membranes or skin necrosis: No Has patient had a PCN reaction that required hospitalization: No Has patient had a PCN reaction occurring within the last 10 years: Yes If all of the above answers are NO, then may proceed with Cephalosporin use.    Codeine Nausea Only and Other (See Comments)    GI Upset headaches   Demerol [Meperidine] Itching, Nausea Only and Other (See Comments)    GI Upset, Headaches    Doxycycline  Hives and Itching   Latex Hives and Itching   Morphine  And Codeine Itching, Nausea Only and Other (See Comments)    Headaches, pt can still take    Oxycodone -Acetaminophen  Itching and Nausea Only   Tramadol Other (See Comments)     GI Upset   Buprenorphine Hcl Itching, Nausea Only and Other (See Comments)    Headaches   Fentanyl  Itching and Nausea And Vomiting   Strawberry Extract Nausea And Vomiting and Rash   Tapentadol Rash   Family History  Problem Relation Age of Onset   Stroke Mother    Depression Mother    Breast cancer Mother 42   Breast cancer Maternal Aunt 61   Alcohol abuse  Father    Stroke Father    Hypertension Father    Family history: Family history reviewed and not pertinent.  Prior to Admission medications   Medication Sig Start Date End Date Taking? Authorizing Provider  atorvastatin  (LIPITOR ) 40 MG tablet Take 1 tablet (40 mg total) by mouth daily. 06/06/21 07/29/24 Yes Sreenath, Sudheer B, MD  hydrochlorothiazide  (MICROZIDE ) 12.5 MG capsule Take 1 capsule (12.5 mg total) by mouth daily. 06/07/21 07/17/24 Yes Sreenath, Sudheer B, MD  ramipril  (ALTACE ) 10 MG capsule TAKE ONE CAPSULE BY MOUTH ONCE DAILY. MUST SCHEDULE APPOINTMENT FOR NOVEMBER 11/01/16  Yes Joshua Cathryne BROCKS, MD  sotalol  (BETAPACE ) 80 MG tablet Take 80 mg by mouth 2 (two) times daily. 07/25/24  Yes [provider]  acetaminophen  (TYLENOL ) 500 MG tablet Take 2 tablets (1,000 mg total) by mouth every 8 (eight) hours. 07/31/24   Charlene Debby BROCKS, PA-C  apixaban  (ELIQUIS ) 5 MG TABS tablet Take 1 tablet (5 mg total) by mouth 2 (two) times daily. 06/21/21 07/29/24  Jhonny Calvin NOVAK, MD  BIOTIN PO Take 1 tablet by mouth daily.    [provider]  buPROPion  (WELLBUTRIN  SR) 150 MG 12 hr tablet Take 1 tablet (150 mg total) by mouth 2 (two) times daily. 11/01/16   Joshua Cathryne BROCKS, MD  busPIRone  (BUSPAR ) 7.5 MG tablet Take 7.5 mg by mouth 2 (two) times daily. 04/29/24   [provider]  diphenhydrAMINE  (BENADRYL ) 25 mg capsule Take 50-75 mg every 6 (six) hours as needed by mouth for itching.     [provider]  docusate sodium  (COLACE) 100 MG capsule Take 1 capsule (100 mg total) by mouth 2 (two) times daily. 07/31/24   Charlene Debby BROCKS, PA-C  fexofenadine (ALLEGRA) 180 MG tablet Take 180 mg by mouth at bedtime.    [provider]  fluticasone  (FLONASE ) 50 MCG/ACT nasal spray USE ONE SPRAY(S) IN EACH NOSTRIL ONCE DAILY Patient taking differently: Place 2 sprays into both nostrils daily. 11/01/16   Joshua Cathryne BROCKS, MD  furosemide  (LASIX ) 20 MG tablet Take 20 mg by mouth  every morning.    [provider]  Multiple Vitamins-Minerals (OCUVITE ADULT 50+ PO) Take 1 tablet by mouth daily.    [provider]  ondansetron  (ZOFRAN ) 4 MG tablet Take 1 tablet (4 mg total) by mouth every 6 (six) hours as needed for nausea. 07/31/24   Charlene Debby BROCKS, PA-C  oxyCODONE  (OXY IR/ROXICODONE ) 5 MG immediate release tablet Take 0.5 tablets (2.5 mg total) by mouth every 6 (six) hours as needed for moderate pain (pain score 4-6). 07/31/24   Charlene Debby BROCKS, PA-C  pantoprazole  (PROTONIX ) 40 MG tablet Take 1 tablet (40 mg total) by mouth 2 (two) times daily. 11/01/16   Joshua Cathryne BROCKS, MD  venlafaxine  XR (EFFEXOR -XR) 150 MG 24 hr capsule Take 150 mg by mouth daily with breakfast.    [provider]  sucralfate  (CARAFATE ) 1 GM/10ML suspension Take by mouth. Cancer Doc 10/20/15 07/09/19  [provider]   Physical Exam: Vitals:   10/02/24 1330 10/02/24 1400 10/02/24 1430 10/02/24 1525  BP: (!) 150/72 (!) 144/76 122/73   Pulse: 82 84 82   Resp: 14 (!) 21 16   Temp:    98.3 F (36.8 C)  TempSrc:    Oral  SpO2: 95% 96% 98%   Weight:      Height:       Constitutional: appears frail, NAD, calm Eyes: PERRL, lids and conjunctivae normal ENMT: Mucous membranes are moist. Posterior pharynx clear of any exudate or lesions. Age-appropriate dentition. Hearing appropriate Neck: normal, supple, no masses, no thyromegaly Respiratory: Generalized decreased lung sounds with expiratory, no crackles.  Increased respiratory effort.  Increased accessory muscle use.  Cardiovascular: Regular rate and rhythm, no murmurs / rubs / gallops. No extremity edema. 2+ pedal pulses. No carotid bruits.  Abdomen: no tenderness, no masses palpated, no hepatosplenomegaly. Bowel sounds positive.  Lower ventral abdominal hernia is present and is reproducible. Musculoskeletal: no clubbing / cyanosis. No joint deformity upper and lower extremities. Good ROM, no contractures, no  atrophy. Normal muscle tone.  Skin: no rashes, lesions, ulcers. No induration Neurologic: Sensation intact. Strength 5/5 in all 4.  Psychiatric: Normal judgment and insight. Alert and oriented x 3. Normal mood.   EKG: independently reviewed, showing sinus rhythm with a rate of 81, QTc 450  Chest x-ray on Admission: I personally reviewed and I agree with radiologist reading as below.  DG Chest Port 1 View Result Date: 10/02/2024 CLINICAL DATA:  Shortness of breath. EXAM:  PORTABLE CHEST 1 VIEW COMPARISON:  None 2024 FINDINGS: New patchy airspace disease identified in the right mid and lower lung and left lung base. Stable blunting of the costophrenic angle suggests chronic small bilateral pleural effusions. Cardiopericardial silhouette is at upper limits of normal for size. Right Port-A-Cath remains in place. Telemetry leads overlie the chest. IMPRESSION: New patchy airspace disease in the right mid and lower lung and left lung base. Imaging features suggest multifocal pneumonia. Electronically Signed   By: Camellia Candle M.D.   On: 10/02/2024 12:04   Labs on Admission: I have personally reviewed following labs  CBC: Recent Labs  Lab 10/02/24 1132  WBC 7.7  NEUTROABS 5.6  HGB 12.3  HCT 38.6  MCV 98.7  PLT 306   Basic Metabolic Panel: Recent Labs  Lab 10/02/24 1132  NA 141  K 2.9*  CL 100  CO2 27  GLUCOSE 218*  BUN 17  CREATININE 0.85  CALCIUM  8.6*   GFR: Estimated Creatinine Clearance: 52.7 mL/min (by C-G formula based on SCr of 0.85 mg/dL).  Liver Function Tests: Recent Labs  Lab 10/02/24 1132  AST 28  ALT 23  ALKPHOS 114  BILITOT 0.5  PROT 7.1  ALBUMIN 3.9   BNP (last 3 results) Recent Labs    10/02/24 1132  PROBNP 4,519.0*   Urine analysis:    Component Value Date/Time   COLORURINE YELLOW (A) 07/19/2024 0951   APPEARANCEUR CLEAR (A) 07/19/2024 0951   LABSPEC 1.016 07/19/2024 0951   PHURINE 5.0 07/19/2024 0951   GLUCOSEU NEGATIVE 07/19/2024 0951    HGBUR NEGATIVE 07/19/2024 0951   BILIRUBINUR NEGATIVE 07/19/2024 0951   KETONESUR NEGATIVE 07/19/2024 0951   PROTEINUR NEGATIVE 07/19/2024 0951   NITRITE NEGATIVE 07/19/2024 0951   LEUKOCYTESUR NEGATIVE 07/19/2024 0951   This document was prepared using Dragon Voice Recognition software and may include unintentional dictation errors.  Dr. Sherre [facility]  If 7PM-7AM, please contact overnight-coverage provider If 7AM-7PM, please contact day attending provider [email or web address]  10/02/2024, 3:33 PM

## 2024-10-02 NOTE — ED Triage Notes (Addendum)
 Pt arrived from home via [facility] with c/o SOB that started last night. Per EMS, pt took cough/cold meds, pt's SpO2 was 85% on arrival with audible wheezing present, pt given duoneb with EMS and pt reports feeling better. Pt reports breathing is a lot better than it was at this time, and denies any pain at this time. Pt reports recent travel to [place], [place]  and visited the hospital there for trouble breathing. Pt is A&Ox4. Pt is NAD at this time.   Per MD at bedside do not apply O2 unless pt's O2 drops below 88%.

## 2024-10-02 NOTE — Assessment & Plan Note (Signed)
 Home Eliquis  5 mg p.o. twice daily, sotalol  80 mg p.o. twice daily, diltiazem 30 mg resumed on admission The above medication was  reviewed based on outpatient cardiology clinic from 04/01/2024

## 2024-10-02 NOTE — Progress Notes (Addendum)
 Critical care note:  Date of note: 10/02/2024  Subjective: The patient just presented from the ED to telemetry bed and was in significant respiratory distress with labored breathing and tachypnea, tachycardia with elevated blood pressure.  No chest pain or palpitations. no fever or chills.  No nausea or vomiting or abdominal pain.  Objective: Physical examination: Generally: Acutely ill elderly lethargic Caucasian female in moderate respiratory distress. Vital signs: BP was 200/108 with heart rate of 128 respiratory rate of 30 temperature 97.6 axillary).  Pulse oximetry 96% on 100% nonrebreather.  Prior to that pulse oximetry was 94% on 3 L of O2 by nasal cannula. Head - atraumatic, normocephalic.  Pupils - equal, round and reactive to light and accommodation. Extraocular movements are intact. No scleral icterus.  Oropharynx - moist mucous membranes and tongue. No pharyngeal erythema or exudate.  Neck - supple. No JVD. Carotid pulses 2+ bilaterally. No carotid bruits. No palpable thyromegaly or lymphadenopathy. Cardiovascular - regular rate and rhythm. Normal S1 and S2. No murmurs, gallops or rubs.  Lungs -diffusely coarse breath sounds with bibasilar crackles diminished expiratory airflow with harsh wheezes with breathing. Abdomen - soft and nontender. Positive bowel sounds. No palpable organomegaly or masses.  Extremities - no pitting edema, clubbing or cyanosis.  Neuro - grossly non-focal. Skin - no rashes. Breast, pelvic and rectal - deferred.   Assessment/plan: 1.  Acute respiratory failure with hypoxia-related to multifocal pneumonia likely secondary to COVID-19. - The patient was given a DuoNeb and transferred to stepdown unit bed. - BiPAP was attempted but the patient did not tolerate it initially. - Will observe her after IV Lasix  and sedation. - Will obtain a stat portable chest x-ray. - If she is not tolerating BiPAP she may need to be intubated. - Consult was placed to  intensivist and the case was discussed with the ICU team. - Care will be assumed by PCCM, if patient is intubated.   Authorized and performed by: Madison Peaches, MD Total critical care time:   35     minutes. Due to a high probability of clinically significant, life-threatening deterioration, the patient required my highest level of preparedness to intervene emergently and I personally spent this critical care time directly and personally managing the patient.  This critical care time included obtaining a history, examining the patient, pulse oximetry, ordering and review of studies, arranging urgent treatment with development of management plan, evaluation of patient's response to treatment, frequent reassessment, and discussions with other providers. This critical care time was performed to assess and manage the high probability of imminent, life-threatening deterioration that could result in multiorgan failure.  It was exclusive of separately billable procedures and treating other patients and teaching time.

## 2024-10-02 NOTE — Assessment & Plan Note (Signed)
 Venlafaxine  150 mg daily with breakfast and buspirone  7.5 mg p.o. twice daily were resumed on

## 2024-10-02 NOTE — Progress Notes (Signed)
 Rapid Response called for this patient. Upon arrival, patient was having difficulty breathing. RR-32, HR-122, SpO2 was 92% on 2 lpm nasal cannula. Patient with audible expiratory wheezes, rhonchi, and coarse crackles. Nebulizer given. MD present and orders received for transfer to step down for Bipap initiation.

## 2024-10-02 NOTE — Consult Note (Signed)
 NAME:  Kelly Krause, MRN:  [id number], DOB:  [birth year], LOS: 0 ADMISSION DATE:  10/02/2024 CONSULTATION DATE:  10/02/24 REFERRING MD:  Dr. Lawence, CHIEF COMPLAINT:  Acute respiratory failure   BRIEF SYNOPSIS: This is an 83 y/o female admitted with multifocal pneumonia and COVID-19 infection; now in acute respiratory failure requiring BIPAP. PCCM consulted for further management  History of Present Illness:  This is an 83 y/o caucasian female with a medical history as indicated below who presented to the ED with complaints of worsening shortness of breath, that started last week. History is obtained from ED records as patient is on Bipap  and severely dyspneic. She took OTC cold remedies to no avail. Today symptoms became unbearable and patient called EMS. Upon EMS arrival, she was found to be hypoxic with an SPO2 level of 85% on room air, and audibly wheezing. She was given a Duoneb and transported to the ED.  Upon ED arrival, she reported feeling better and her SpO2 had increased to 88% on RA. Her chest X-ray showed small bilateral pleural effusions and opacities in multiple lung fields. Her viral panel was positive for COVID-19 infection and her potassium level was 2.9. She was started on broad spectrum antibiotics, potassium replacements and admitted to the floor. Upon arrival on the floor, patient was severely dyspneic and hypoxic with SPO2 in the low 80s on nasal cannula. A rapid response was called and patient was transferred to the ICU. PCCM was consulted for further management.  Upon arrival in the ICU, patient was tachypneic with RR in the 50s, BP in the 200s and SPO2 in the high 80s on non-rebreather. She denies chest pain but reports diaphoresis and severe difficulty breathing.   Of note, patient recently traveled to [place] and while there, she was in the hospital there for shortness of breath.  Past Medical History:  Diagnosis Date   Allergy    Anticoagulated on apixaban      Atrial fibrillation (HCC)    Colon cancer (HCC)    chemo/rad 15 yrs   DDD (degenerative disc disease), cervical    DM (diabetes mellitus), type 2 (HCC)    controlled no medications   Elevated lipids    Esophageal cancer (HCC)    GERD (gastroesophageal reflux disease)    History of blood transfusion    Hyperlipidemia    Hypertension    Lung cancer (HCC)    chemo   Major depressive disorder, recurrent episode, moderate (HCC)    Osteoarthritis of left hip    Persistent disorder of initiating or maintaining sleep    Personal history of chemotherapy    Personal history of radiation therapy    colon   Stroke (HCC) 06/03/2021   No Deficits   SVT (supraventricular tachycardia)    Past Surgical History:  Procedure Laterality Date   ABDOMINAL HYSTERECTOMY     ATRIAL FIBRILLATION ABLATION     BROW LIFT Bilateral 01/07/2022   Procedure: BLEPHAROPLASTY UPPER EYELID; W/ EXCESS SKIN BILATERAL;  Surgeon: Ashley Greig HERO, MD;  Location: [facility];  Service: Ophthalmology;  Laterality: Bilateral;   BUNIONECTOMY     CAPSULOTOMY METATARSOPHALANGEAL Left 04/19/2017   Procedure: CAPSULOTOMY METATARSOPHALANGEAL;  Surgeon: Ashley Soulier, DPM;  Location: [facility];  Service: Podiatry;  Laterality: Left;   CATARACT EXTRACTION W/PHACO Right 08/16/2021   Procedure: CATARACT EXTRACTION PHACO AND INTRAOCULAR LENS PLACEMENT (IOC) RIGHT Eyhance Toric;  Surgeon: Myrna Adine Anes, MD;  Location: [facility];  Service: Ophthalmology;  Laterality: Right;  5.02 00:33.8   CATARACT EXTRACTION W/PHACO Left 08/30/2021   Procedure: CATARACT EXTRACTION PHACO AND INTRAOCULAR LENS PLACEMENT (IOC) LEFT 5.10 00:43.4;  Surgeon: Myrna Adine Anes, MD;  Location: [facility];  Service: Ophthalmology;  Laterality: Left;   COLECTOMY     COLONOSCOPY N/A 06/28/2024   Procedure: COLONOSCOPY;  Surgeon: Onita Elspeth Sharper, DO;  Location: [facility] ENDOSCOPY;  Service: Gastroenterology;   Laterality: N/A;   COLONOSCOPY WITH PROPOFOL  N/A 09/26/2017   Procedure: COLONOSCOPY WITH PROPOFOL ;  Surgeon: Gaylyn Gladis PENNER, MD;  Location: [facility] END[facility];  Service: Endoscopy;  Laterality: N/A;   ESOPHAGOGASTRODUODENOSCOPY N/A 04/21/2015   Procedure: ESOPHAGOGASTRODUODENOSCOPY (EGD);  Surgeon: Gladis PENNER Gaylyn, MD;  Location: [facility] END[facility]COPY;  Service: Endoscopy;  Laterality: N/A;   Esophgeal cancer     HAMMER TOE SURGERY Left 04/19/2017   Procedure: HAMMER TOE CORRECTION-3RD & 4TH;  Surgeon: Ashley Soulier, DPM;  Location: [facility];  Service: Podiatry;  Laterality: Left;   LOBECTOMY Right    METATARSAL OSTEOTOMY Left 04/19/2017   Procedure: PHALANX OSTEOTOMY-AKIN - LEFT;  Surgeon: Ashley Soulier, DPM;  Location: [facility];  Service: Podiatry;  Laterality: Left;   POLYPECTOMY  06/28/2024   Procedure: POLYPECTOMY, INTESTINE;  Surgeon: Onita Elspeth Sharper, DO;  Location: [facility] ENDOSCOPY;  Service: Gastroenterology;;   SINUSOTOMY     TOTAL HIP ARTHROPLASTY Left 07/29/2024   Procedure: ARTHROPLASTY, HIP, TOTAL, ANTERIOR APPROACH;  Surgeon: Lorelle Hussar, MD;  Location: [facility] ORS;  Service: Orthopedics;  Laterality: Left;   Current Facility-Administered Medications on File Prior to Encounter  Medication   0.9 %  sodium chloride  infusion   heparin  lock flush 100 unit/mL   heparin  lock flush 100 unit/mL   sodium chloride  0.9 % injection 10 mL   sodium chloride  flush (NS) 0.9 % injection 10 mL   Current Outpatient Medications on File Prior to Encounter  Medication Sig   acetaminophen  (TYLENOL ) 500 MG tablet Take 2 tablets (1,000 mg total) by mouth every 8 (eight) hours.   apixaban  (ELIQUIS ) 5 MG TABS tablet Take 1 tablet (5 mg total) by mouth 2 (two) times daily.   atorvastatin  (LIPITOR ) 40 MG tablet Take 1 tablet (40 mg total) by mouth daily.   diphenhydrAMINE  (BENADRYL ) 25 mg capsule Take 50-75 mg every 6 (six) hours as needed by mouth for itching.    docusate  sodium (COLACE) 100 MG capsule Take 1 capsule (100 mg total) by mouth 2 (two) times daily.   fexofenadine (ALLEGRA) 180 MG tablet Take 180 mg by mouth at bedtime.   fluticasone  (FLONASE ) 50 MCG/ACT nasal spray USE ONE SPRAY(S) IN EACH NOSTRIL ONCE DAILY (Patient taking differently: Place 2 sprays into both nostrils daily.)   furosemide  (LASIX ) 20 MG tablet Take 20 mg by mouth every morning.   hydrochlorothiazide  (MICROZIDE ) 12.5 MG capsule Take 1 capsule (12.5 mg total) by mouth daily.   Multiple Vitamins-Minerals (OCUVITE ADULT 50+ PO) Take 1 tablet by mouth daily.   sotalol  (BETAPACE ) 80 MG tablet Take 80 mg by mouth 2 (two) times daily.   venlafaxine  XR (EFFEXOR -XR) 150 MG 24 hr capsule Take 150 mg by mouth daily with breakfast.   BIOTIN PO Take 1 tablet by mouth daily.   buPROPion  (WELLBUTRIN  SR) 150 MG 12 hr tablet Take 1 tablet (150 mg total) by mouth 2 (two) times daily. (Patient not taking: Reported on 10/02/2024)   busPIRone  (BUSPAR ) 7.5 MG tablet Take 7.5 mg by mouth 2 (two) times daily. (Patient not taking: Reported on 10/02/2024)   ondansetron  (  ZOFRAN ) 4 MG tablet Take 1 tablet (4 mg total) by mouth every 6 (six) hours as needed for nausea. (Patient not taking: Reported on 10/02/2024)   oxyCODONE  (OXY IR/ROXICODONE ) 5 MG immediate release tablet Take 0.5 tablets (2.5 mg total) by mouth every 6 (six) hours as needed for moderate pain (pain score 4-6). (Patient not taking: Reported on 10/02/2024)   pantoprazole  (PROTONIX ) 40 MG tablet Take 1 tablet (40 mg total) by mouth 2 (two) times daily. (Patient not taking: Reported on 10/02/2024)   ramipril  (ALTACE ) 10 MG capsule TAKE ONE CAPSULE BY MOUTH ONCE DAILY. MUST SCHEDULE APPOINTMENT FOR NOVEMBER (Patient not taking: Reported on 10/02/2024)   [DISCONTINUED] sucralfate  (CARAFATE ) 1 GM/10ML suspension Take by mouth. Cancer Doc    Allergies Allergies  Allergen Reactions   Amoxicillin-Pot Clavulanate Itching, Nausea Only and Other (See  Comments)    Has patient had a PCN reaction causing immediate rash, facial/tongue/throat swelling, SOB or lightheadedness with hypotension: No Has patient had a PCN reaction causing severe rash involving mucus membranes or skin necrosis: No Has patient had a PCN reaction that required hospitalization: No Has patient had a PCN reaction occurring within the last 10 years: Yes If all of the above answers are NO, then may proceed with Cephalosporin use.    Codeine Nausea Only and Other (See Comments)    GI Upset headaches   Demerol [Meperidine] Itching, Nausea Only and Other (See Comments)    GI Upset, Headaches    Doxycycline  Hives and Itching   Ferrous Fumarate Nausea And Vomiting, Nausea Only and Other (See Comments)   Latex Hives and Itching   Morphine  And Codeine Itching, Nausea Only and Other (See Comments)    Headaches, pt can still take    Oxycodone  Nausea And Vomiting and Other (See Comments)   Oxycodone -Acetaminophen  Itching and Nausea Only   Penicillins Diarrhea, Itching, Other (See Comments) and Nausea Only   Pollen Extract Hives, Itching and Swelling   Tramadol Other (See Comments)     GI Upset   Buprenorphine Hcl Itching, Nausea Only and Other (See Comments)    Headaches   Fentanyl  Itching and Nausea And Vomiting   Strawberry Extract Nausea And Vomiting and Rash   Tapentadol Rash    Family History Family History  Problem Relation Age of Onset   Stroke Mother    Depression Mother    Breast cancer Mother 24   Breast cancer Maternal Aunt 33   Alcohol abuse Father    Stroke Father    Hypertension Father    Social History  reports that she has quit smoking. Her smoking use included cigarettes. She has never used smokeless tobacco. She reports current alcohol use. She reports that she does not use drugs.  Review Of Systems:  Limited as she is severely dyspneic. Nods in denial of pain but agrees to shortness of breath   VITAL SIGNS: BP (!) 200/99   Pulse (!) 122    Temp 97.6 F (36.4 C) (Axillary)   Resp (!) 31   Ht 5' 7 (1.702 m)   Wt 76.8 kg   SpO2 97%   BMI 26.53 kg/m   HEMODYNAMICS:    VENTILATOR SETTINGS: FiO2 (%):  [100 %] 100 % PEEP:  [5 cmH20] 5 cmH20 Pressure Support:  [8 cmH20] 8 cmH20  INTAKE / OUTPUT: No intake/output data recorded.  PHYSICAL EXAMINATION: General: Acutely ill looking, in severe respiratory distress HEENT: Normocephalic and atraumatic, pupils equal round and reactive, trachea midline, mild JVD, oral mucosa  dry Neuro: Somnolent but arousable, alert and oriented x 4, moves all extremities to command Cardiovascular: Apical pulse tachycardic in the 140s, S1-S2, no murmur regurg or gallop, +2 pulses bilaterally Lungs: Use of accessory muscles of respirations, breath sounds diminished in all lung fields, diffuse inspiratory and expiratory wheezes in all lung fields, positive rhonchi in all lung fields Abdomen: Nondistended, normal bowel sounds in all 4 quadrants Musculoskeletal: No joint deformities, positive range of motion Skin: Diaphoretic and cold to touch Extremities: +2 pulses bilaterally, no edema  LABS:  BMET Recent Labs  Lab 10/02/24 1132  NA 141  K 2.9*  CL 100  CO2 27  BUN 17  CREATININE 0.85  GLUCOSE 218*    Electrolytes Recent Labs  Lab 10/02/24 1132  CALCIUM  8.6*    CBC Recent Labs  Lab 10/02/24 1132  WBC 7.7  HGB 12.3  HCT 38.6  PLT 306    Coag's No results for input(s): APTT, INR in the last 168 hours.  Sepsis Markers No results for input(s): LATICACIDVEN, PROCALCITON, O2SATVEN in the last 168 hours.  ABG Recent Labs  Lab 10/02/24 2123  PHART 7.15*  PCO2ART 59*  PO2ART 310*    Liver Enzymes Recent Labs  Lab 10/02/24 1132  AST 28  ALT 23  ALKPHOS 114  BILITOT 0.5  ALBUMIN 3.9    Cardiac Enzymes Recent Labs  Lab 10/02/24 1132  PROBNP 4,519.0*    Glucose Recent Labs  Lab 10/02/24 2027  GLUCAP 188*    Imaging DG Chest Port 1  View Result Date: 10/02/2024 CLINICAL DATA:  Shortness of breath. EXAM: PORTABLE CHEST 1 VIEW COMPARISON:  None 2024 FINDINGS: New patchy airspace disease identified in the right mid and lower lung and left lung base. Stable blunting of the costophrenic angle suggests chronic small bilateral pleural effusions. Cardiopericardial silhouette is at upper limits of normal for size. Right Port-A-Cath remains in place. Telemetry leads overlie the chest. IMPRESSION: New patchy airspace disease in the right mid and lower lung and left lung base. Imaging features suggest multifocal pneumonia. Electronically Signed   By: Camellia Candle M.D.   On: 10/02/2024 12:04   STUDIES:  2D echo pending  CULTURES: Blood cultures x 2  Legionella Strep pneumonia  ANTIBIOTICS: Ceftriaxone Azithromycin  SIGNIFICANT EVENTS: 10/02/2024: Admitted with pneumonia and later transferred to the ICU for acute respiratory failure  LINES/TUBES: Peripheral IVs Foley catheter  DISCUSSION: 83 year old female presenting with acute hypoxic and hypercarbic respiratory failure due to a combination of COVID-19 infection, multifocal pneumonia and volume overload.  She is currently on BiPAP but she is at high risk for intubation.  Will keep her on the trial.  Off BiPAP and if no improvement within an hour we will proceed with intubation.  ASSESSMENT / PLAN:  PULMONARY A: Acute hypoxic and hypercarbic respiratory failure-current pH 7.15, pCO2 59 and PaO2 310 on BiPAP.  Patient is very agitated and breathing at 50 breaths/min. Multifocal pneumonia COVID-19 infection Acute pulmonary edema History of tobacco use History of lung cancer s/p radiation and chemo P:   -Continue BiPAP with current settings - Precedex infusion for comfort on BiPAP and agitation - Nebulized bronchodilators -IV Solu-Medrol  40 mg every 12 - Incentive spirometry and flutter valve every 2 hours while awake - Serial ABGs and chest x-ray as needed -  Furosemide  80 mg IV x 1 now - Nitropaste 1 inch topically x 1 now; discontinue for SBP less than 90 -Repeat ABG in 1 hour  CARDIOVASCULAR A:  SVT  with heart rates in the 140s; last 2D echo was in 2022 with an ejection fraction of 60 to 65% grade 2 diastolic dysfunction History of diastolic heart failure-now with what appears to be an acute exacerbation Hypertensive emergency-patient's blood pressures are in the 200s History of atrial fibrillation on Eliquis  History of hypertension and hyperlipidemia P:  -Elevated heart rate likely due to respiratory distress.  Will control respiratory symptoms and if no improvement will consider low-dose beta-blocker IV - Hemodynamic monitoring per ICU protocol - Patient is unable to take oral home medications due to respiratory distress and being on BiPAP; will resume in the morning if respiratory status is improved - Nitropaste 1 inch topically x 1 - 2D echo in the morning  RENAL A:   Hypokalemia-patient's potassium in the ED was 2.0 P:   -Monitor and replace electrolytes - Trend renal indicis  GASTROINTESTINAL A:   History of GERD P:   -Resume home medications  HEMATOLOGIC A:   -History of colon cancer - History of lung cancer P:  -Monitor hemoglobin and hematocrit  INFECTIOUS A:   COVID-19 infection Community-acquired pneumonia P:   -COVID-19 precautions - Respiratory support with BiPAP, IV steroids, empiric antibiotics and nebulized bronchodilators - Follow-up cultures  ENDOCRINE A:   Hyperglycemia-blood sugars are in the 200s History of diet-controlled type 2 diabetes P:   -Blood glucose monitoring with sliding scale insulin coverage - Hemoglobin A1c with a.m. labs - Consider insulin infusion if blood sugar levels remain consistently greater than 300 despite sliding scale scale coverage  NEUROLOGIC A:   Acute hypoxic and hypercarbic encephalopathy P:   -Monitor neurological status closely - Treat underlying  infection, hypoxia and hypercarbia with BiPAP  2230: Repeat ABG reviewed and shows a pH of 7.32, pCO2 of 45 and pH PaO2 of 327.  BiPAP settings adjusted to decrease FiO2.  Patient is more comfortable.  Respiratory rate is down to 22 breaths/min.  Will continue respiratory support with BiPAP.  The goal is to avoid intubating this patient.  She is showing improvement with BiPAP, Precedex infusion, furosemide  and topical nitroglycerin.  Will continue to monitor closely   Best Practice: Diet/type: N.p.o. for now until respiratory status improves and then resume Modified diet VTE prophylaxis:  SCD's /already on full-strength apixaban  GI prophylaxis: Not indicated Lines: PIV's Foley: In place and still needed Code Status: Full code Last date of multidisciplinary goals of care discussion-10/03/2024 during rounds  NB: This document was prepared using Dragon voice recognition software and may include unintentional dictation errors.    10/02/2024, 9:55 PM

## 2024-10-02 NOTE — Hospital Course (Addendum)
 Ms. Kelly Krause is a 83 year old female with history of atrial fibrillation on Eliquis , hyperlipidemia, depression, hypertension, GERD, who presents ED for chief concerns of shortness of breath. She has a severe hypoxemia with oxygen saturation at 85%, she was treated with BiPAP overnight. She was also found to have possible COVID, due to acute respite failure, patient was started on IV steroids. Patient was also found to have a BNP of 4519, elevated troponin 74, she was given 80 mg IV Lasix .  Patient was in ICU overnight 11/12, condition has improved, she is currently on 2 L oxygen, transferred to progressive unit 11/13.  Condition had resolved, oxygenation improved to 100% on room air.  Medically stable for discharge.

## 2024-10-02 NOTE — Progress Notes (Signed)
 Rapid Response Event Note   Reason for Call : resp distress, hypertensive   Initial Focused Assessment: On my arrival pt is in bed noticeably short of breath. Pt has audible wheezes and crackles. RT at bedside giving breathing treatment. Pt is alert and oriented and states she can't breathe. Pt's BP 200/108. HR 128.    Interventions: Dr. Lawence at bedside. Orders received to transfer pt to ICU and place pt on bipap.   Plan of Care: Pt transferred to ICU 7.    Event Summary:   MD Notified: Dr. Lawence Call Time: 2023 Arrival Time: 2025 End Time: Pt transferred to ICU 7  Duwaine LOISE Cools, RN

## 2024-10-02 NOTE — Assessment & Plan Note (Signed)
 Lower abdominal ventral hernia is present Patient reports she just noticed that about 1 to 2 weeks ago. Patient has evidence of intra-abdominal surgery The hernia is reproducible and does not cause pain Counseled patient to leave it alone and only make PCP with possible referral to general surgery if the hernia causes pain

## 2024-10-02 NOTE — Assessment & Plan Note (Signed)
 Etiology is multifactorial in setting of multilobar pneumonia along with COVID positive respiratory infection Check MRSA PCR, Legionella urine antigen Continue with ceftriaxone 2 g IV daily, azithromycin 500 mg IV daily to complete a 5-day course Incentive spirometry, flutter valve Maintain SpO2 greater than 92%

## 2024-10-02 NOTE — ED Notes (Signed)
 SpO2 >93%, placed on 2 L [place]

## 2024-10-02 NOTE — Assessment & Plan Note (Signed)
Home PPI twice daily resumed

## 2024-10-02 NOTE — Assessment & Plan Note (Signed)
Home atorvastatin 40 mg nightly resumed

## 2024-10-02 NOTE — ED Provider Notes (Signed)
 [facility] Provider Note   Event Date/Time   First MD Initiated Contact with Patient 10/02/24 1115     (approximate) History  Shortness of Breath  HPI Kelly Krause is a 83 y.o. female with a past medical history of hyperlipidemia, hypertension, intermittent atrial fibrillation on anticoagulation, and type 2 diabetes who presents complaining of worsening shortness of breath beginning last evening.  Per EMS, patient has taken cough and cold meds however patient was found to be 85% on their arrival with audible wheezing and given a DuoNeb in transport.  Patient reports feeling better however her oxygen saturation on room air on arrival is 88%.  Patient states that she had recent travel to [facility] and visited a hospital there for trouble breathing. ROS: Patient currently denies any vision changes, tinnitus, difficulty speaking, facial droop, sore throat, chest pain, abdominal pain, nausea/vomiting/diarrhea, dysuria, or weakness/numbness/paresthesias in any extremity   Physical Exam  Triage Vital Signs: ED Triage Vitals [10/02/24 1116]  Encounter Vitals Group     BP      Girls Systolic BP Percentile      Girls Diastolic BP Percentile      Boys Systolic BP Percentile      Boys Diastolic BP Percentile      Pulse      Resp      Temp      Temp src      SpO2 (!) 85 %     Weight      Height      Head Circumference      Peak Flow      Pain Score      Pain Loc      Pain Education      Exclude from Growth Chart    Most recent vital signs: Vitals:   10/02/24 1430 10/02/24 1525  BP: 122/73   Pulse: 82   Resp: 16   Temp:  98.3 F (36.8 C)  SpO2: 98%    General: Awake, oriented x4. CV:  Good peripheral perfusion.  No MGR Resp:  Normal effort.  Decreased breath sounds bilaterally Abd:  No distention. Other:  Elderly overweight Caucasian female resting comfortably in no acute distress ED Results / Procedures / Treatments  Labs (all labs ordered are  listed, but only abnormal results are displayed) Labs Reviewed  RESP PANEL BY RT-PCR (RSV, FLU A&B, COVID)  RVPGX2 - Abnormal; Notable for the following components:      Result Value   SARS Coronavirus 2 by RT PCR POSITIVE (*)    All other components within normal limits  COMPREHENSIVE METABOLIC PANEL WITH GFR - Abnormal; Notable for the following components:   Potassium 2.9 (*)    Glucose, Bld 218 (*)    Calcium  8.6 (*)    All other components within normal limits  PRO BRAIN NATRIURETIC PEPTIDE - Abnormal; Notable for the following components:   Pro Brain Natriuretic Peptide 4,519.0 (*)    All other components within normal limits  BLOOD GAS, VENOUS - Abnormal; Notable for the following components:   Bicarbonate 30.8 (*)    Acid-Base Excess 4.4 (*)    All other components within normal limits  TROPONIN T, HIGH SENSITIVITY - Abnormal; Notable for the following components:   Troponin T High Sensitivity 74 (*)    All other components within normal limits  TROPONIN T, HIGH SENSITIVITY - Abnormal; Notable for the following components:   Troponin T High Sensitivity 67 (*)    All other  components within normal limits  CULTURE, BLOOD (ROUTINE X 2)  CULTURE, BLOOD (ROUTINE X 2)  MRSA NEXT GEN BY PCR, NASAL  CBC WITH DIFFERENTIAL/PLATELET  LEGIONELLA PNEUMOPHILA SEROGP 1 UR AG   EKG ED ECG REPORT I, Artist MARLA Kerns, the attending physician, personally viewed and interpreted this ECG. Date: 10/02/2024 EKG Time: 1127 Rate: 81 Rhythm: normal sinus rhythm QRS Axis: normal Intervals: Left bundle branch block ST/T Wave abnormalities: normal Narrative Interpretation: Normal sinus rhythm with left bundle branch block no evidence of acute ischemia RADIOLOGY ED MD interpretation: Single view portable chest x-ray showing new patchy airspace disease in right mid and lower lung as well as left lung base concerning for multifocal pneumonia - All radiology independently interpreted and agree with  radiology assessment Official radiology report(s): DG Chest Port 1 View Result Date: 10/02/2024 CLINICAL DATA:  Shortness of breath. EXAM: PORTABLE CHEST 1 VIEW COMPARISON:  None 2024 FINDINGS: New patchy airspace disease identified in the right mid and lower lung and left lung base. Stable blunting of the costophrenic angle suggests chronic small bilateral pleural effusions. Cardiopericardial silhouette is at upper limits of normal for size. Right Port-A-Cath remains in place. Telemetry leads overlie the chest. IMPRESSION: New patchy airspace disease in the right mid and lower lung and left lung base. Imaging features suggest multifocal pneumonia. Electronically Signed   By: Camellia Candle M.D.   On: 10/02/2024 12:04   PROCEDURES: Critical Care performed: Yes, see critical care procedure note(s) .1-3 Lead EKG Interpretation  Performed by: Kerns Artist MARLA, MD Authorized by: Kerns Artist MARLA, MD     Interpretation: normal     ECG rate:  81   ECG rate assessment: normal     Rhythm: sinus rhythm     Ectopy: none     Conduction: normal   CRITICAL CARE Performed by: Jone Panebianco K Rhonda Vangieson  Total critical care time: 33 minutes  Critical care time was exclusive of separately billable procedures and treating other patients.  Critical care was necessary to treat or prevent imminent or life-threatening deterioration.  Critical care was time spent personally by me on the following activities: development of treatment plan with patient and/or surrogate as well as nursing, discussions with consultants, evaluation of patient's response to treatment, examination of patient, obtaining history from patient or surrogate, ordering and performing treatments and interventions, ordering and review of laboratory studies, ordering and review of radiographic studies, pulse oximetry and re-evaluation of patient's condition.  MEDICATIONS ORDERED IN ED: Medications  oxyCODONE  (Oxy IR/ROXICODONE ) immediate release tablet  2.5 mg (has no administration in time range)  ramipril  (ALTACE ) capsule 10 mg (has no administration in time range)  busPIRone  (BUSPAR ) tablet 7.5 mg (has no administration in time range)  venlafaxine  XR (EFFEXOR -XR) 24 hr capsule 150 mg (has no administration in time range)  docusate sodium  (COLACE) capsule 100 mg (has no administration in time range)  acetaminophen  (TYLENOL ) tablet 650 mg (has no administration in time range)    Or  acetaminophen  (TYLENOL ) suppository 650 mg (has no administration in time range)  ondansetron  (ZOFRAN ) tablet 4 mg (has no administration in time range)    Or  ondansetron  (ZOFRAN ) injection 4 mg (has no administration in time range)  cefTRIAXone (ROCEPHIN) 2 g in sodium chloride  0.9 % 100 mL IVPB (has no administration in time range)  azithromycin (ZITHROMAX) 500 mg in sodium chloride  0.9 % 250 mL IVPB (has no administration in time range)  ipratropium-albuterol (DUONEB) 0.5-2.5 (3) MG/3ML nebulizer solution 3 mL (3  mLs Nebulization Given 10/02/24 1520)  methylPREDNISolone  sodium succinate (SOLU-MEDROL ) 40 mg/mL injection 40 mg (has no administration in time range)  apixaban  (ELIQUIS ) tablet 5 mg (has no administration in time range)  atorvastatin  (LIPITOR ) tablet 40 mg (has no administration in time range)  pantoprazole  (PROTONIX ) EC tablet 40 mg (has no administration in time range)  sotalol  (BETAPACE ) tablet 80 mg (has no administration in time range)  furosemide  (LASIX ) tablet 20 mg (has no administration in time range)  diltiazem (CARDIZEM) tablet 30 mg (has no administration in time range)  hydrALAZINE  (APRESOLINE ) injection 5 mg (has no administration in time range)  azithromycin (ZITHROMAX) 500 mg in sodium chloride  0.9 % 250 mL IVPB (0 mg Intravenous Stopped 10/02/24 1507)  potassium chloride  (KLOR-CON ) packet 40 mEq (40 mEq Oral Given 10/02/24 1251)  potassium chloride  10 mEq in 100 mL IVPB (0 mEq Intravenous Stopped 10/02/24 1521)   methylPREDNISolone  sodium succinate (SOLU-MEDROL ) 125 mg/2 mL injection 125 mg (125 mg Intravenous Given 10/02/24 1453)   IMPRESSION / MDM / ASSESSMENT AND PLAN / ED COURSE  I reviewed the triage vital signs and the nursing notes.                             The patient is on the cardiac monitor to evaluate for evidence of arrhythmia and/or significant heart rate changes. Patient's presentation is most consistent with acute presentation with potential threat to life or bodily function. Patient is an 83 year old female with the above-stated past medical history who presents complaining of worsening shortness of breath in the setting of recent travel DDx: Sepsis, pneumonia, bronchitis, PE Plan: Sepsis order set initiated ABX: Rocephin and azithromycin IV fluids: LR  Patient's laboratory and radiologic evaluation concerning for likely COVID-pneumonia given COVID-19 positivity as well as multifocal pneumonia seen on chest x-ray.  Patient maintained on supplemental oxygenation throughout emergency department course and therefore will require admission to the internal medicine service for further evaluation and management.  I spoke to the on-call hospitalist, Dr. Sherre, who is graciously agreed to accept this patient onto her service for further evaluation and management  Dispo: Admit to medicine   FINAL CLINICAL IMPRESSION(S) / ED DIAGNOSES   Final diagnoses:  Acute respiratory failure with hypoxia (HCC)  COVID-19 virus infection  Pneumonia due to COVID-19 virus   Rx / DC Orders   ED Discharge Orders     None      Note:  This document was prepared using Dragon voice recognition software and may include unintentional dictation errors.   Bracy Pepper K, MD 10/02/24 [phone]

## 2024-10-02 NOTE — ED Notes (Signed)
 Lab at bedside

## 2024-10-03 ENCOUNTER — Inpatient Hospital Stay
Admit: 2024-10-03 | Discharge: 2024-10-03 | Disposition: A | Discharge status: Home / Self Care | Attending: Adult Health | Admitting: Adult Health

## 2024-10-03 DIAGNOSIS — I5033 Acute on chronic diastolic (congestive) heart failure: Secondary | ICD-10-CM | POA: Diagnosis not present

## 2024-10-03 DIAGNOSIS — U071 COVID-19: Secondary | ICD-10-CM | POA: Diagnosis not present

## 2024-10-03 DIAGNOSIS — J9601 Acute respiratory failure with hypoxia: Secondary | ICD-10-CM | POA: Diagnosis not present

## 2024-10-03 DIAGNOSIS — J1282 Pneumonia due to coronavirus disease 2019: Secondary | ICD-10-CM | POA: Diagnosis not present

## 2024-10-03 LAB — GLUCOSE, CAPILLARY
Glucose-Capillary: 299 mg/dL — ABNORMAL HIGH (ref 70–99)
Glucose-Capillary: 134 mg/dL — ABNORMAL HIGH (ref 70–99)
Glucose-Capillary: 109 mg/dL — ABNORMAL HIGH (ref 70–99)
Glucose-Capillary: 177 mg/dL — ABNORMAL HIGH (ref 70–99)
Glucose-Capillary: 143 mg/dL — ABNORMAL HIGH (ref 70–99)
Glucose-Capillary: 223 mg/dL — ABNORMAL HIGH (ref 70–99)

## 2024-10-03 LAB — CBC
HCT: 36.3 % (ref 36.0–46.0)
Hemoglobin: 11.4 g/dL — ABNORMAL LOW (ref 12.0–15.0)
MCH: 31 pg (ref 26.0–34.0)
MCHC: 31.4 g/dL (ref 30.0–36.0)
MCV: 98.6 fL (ref 80.0–100.0)
Platelets: 295 K/uL (ref 150–400)
RBC: 3.68 MIL/uL — ABNORMAL LOW (ref 3.87–5.11)
RDW: 14 % (ref 11.5–15.5)
WBC: 8.7 K/uL (ref 4.0–10.5)
nRBC: 0 % (ref 0.0–0.2)

## 2024-10-03 LAB — BASIC METABOLIC PANEL WITH GFR
Anion gap: 12 (ref 5–15)
BUN: 21 mg/dL (ref 8–23)
CO2: 27 mmol/L (ref 22–32)
Calcium: 8.5 mg/dL — ABNORMAL LOW (ref 8.9–10.3)
Chloride: 103 mmol/L (ref 98–111)
Creatinine, Ser: 0.82 mg/dL (ref 0.44–1.00)
GFR, Estimated: >60 mL/min (ref >60)
Glucose, Bld: 125 mg/dL — ABNORMAL HIGH (ref 70–99)
Potassium: 4.1 mmol/L (ref 3.5–5.1)
Sodium: 142 mmol/L (ref 135–145)

## 2024-10-03 LAB — HEMOGLOBIN A1C
Hgb A1c MFr Bld: 5.9 % — ABNORMAL HIGH (ref 4.8–5.6)
Mean Plasma Glucose: 122.63 mg/dL

## 2024-10-03 LAB — ECHOCARDIOGRAM COMPLETE
AR max vel: 1.67 cm2
AV Area VTI: 1.61 cm2
AV Area mean vel: 1.58 cm2
AV Mean grad: 4 mmHg
AV Peak grad: 7.4 mmHg
Ao pk vel: 1.36 m/s
Area-P 1/2: 3.54 cm2
Height: 67 in
MV VTI: 2.06 cm2
S' Lateral: 3.6 cm
Weight: 2641.99 [oz_av]

## 2024-10-03 LAB — STREP PNEUMONIAE URINARY ANTIGEN: Strep Pneumo Urinary Antigen: NEGATIVE

## 2024-10-03 MED ORDER — METHYLPREDNISOLONE SODIUM SUCC 40 MG IJ SOLR
40.0000 mg | Freq: Two times a day (BID) | INTRAMUSCULAR | Status: DC
  Administered 2024-10-03: 40 mg via INTRAVENOUS
  Filled 2024-10-03: qty 1

## 2024-10-03 MED ORDER — METHYLPREDNISOLONE SODIUM SUCC 40 MG IJ SOLR
40.0000 mg | Freq: Every day | INTRAMUSCULAR | Status: AC
  Administered 2024-10-03 – 2024-10-06 (×4): 40 mg via INTRAVENOUS
  Filled 2024-10-03 (×4): qty 1

## 2024-10-03 MED ORDER — FUROSEMIDE 10 MG/ML IJ SOLN
40.0000 mg | Freq: Two times a day (BID) | INTRAMUSCULAR | Status: DC
  Administered 2024-10-03 – 2024-10-04 (×4): 40 mg via INTRAVENOUS
  Filled 2024-10-03 (×4): qty 4

## 2024-10-03 MED ORDER — IPRATROPIUM-ALBUTEROL 0.5-2.5 (3) MG/3ML IN SOLN: 3.0000 mL | Freq: Four times a day (QID) | RESPIRATORY_TRACT | Status: DC | PRN

## 2024-10-03 MED ORDER — ORAL CARE MOUTH RINSE: 15.0000 mL | OROMUCOSAL | Status: DC | PRN

## 2024-10-03 MED ORDER — ENSURE PLUS HIGH PROTEIN PO LIQD
237.0000 mL | Freq: Two times a day (BID) | ORAL | Status: DC
  Administered 2024-10-03 – 2024-10-06 (×5): 237 mL via ORAL

## 2024-10-03 MED ORDER — MORPHINE SULFATE (PF) 2 MG/ML IV SOLN: 1.0000 mg | INTRAVENOUS | Status: DC | PRN

## 2024-10-03 MED ORDER — ADULT MULTIVITAMIN W/MINERALS CH
1.0000 | ORAL_TABLET | Freq: Every day | ORAL | Status: DC
  Administered 2024-10-04 – 2024-10-07 (×4): 1 via ORAL
  Filled 2024-10-03 (×5): qty 1

## 2024-10-03 NOTE — Plan of Care (Signed)
  Problem: Respiratory: Goal: Will maintain a patent airway Outcome: Progressing Goal: Complications related to the disease process, condition or treatment will be avoided or minimized Outcome: Progressing   Problem: Clinical Measurements: Goal: Ability to maintain clinical measurements within normal limits will improve Outcome: Progressing Goal: Respiratory complications will improve Outcome: Progressing Goal: Cardiovascular complication will be avoided Outcome: Progressing

## 2024-10-03 NOTE — Progress Notes (Signed)
*  PRELIMINARY RESULTS* Echocardiogram 2D Echocardiogram has been performed.  Kelly Krause 10/03/2024, 1:49 PM

## 2024-10-03 NOTE — Progress Notes (Signed)
 Port accessed by NP [employer].

## 2024-10-03 NOTE — TOC Initial Note (Signed)
 Transition of Care [facility]) - Initial/Assessment Note    Patient Details  Name: Kelly Krause MRN: [id number] Date of Birth: May 26, 1941  [facility] CM/SW Contact:    Corrie JINNY Ruts, LCSW Phone Number: 10/03/2024, 12:04 PM  Clinical Narrative:                 Chart reviewed. The patient was admitted for Acute hypoxic respiratory failure. I was able to speak with the patient at bedside today. I introduced myself, my role, and reason for consult. The patient confirms that she has a PCP. The patient reports that she lives by herself at the moment and her husband is currently a resident in a SNF. The patient wasn't able to remember the SNF.   The patient reports that she was able to complete daily living task independently. The patient reports that she drove herself to medical appointments. The patient reports that her step daughter will help her during D/C. The patient reports that she uses [facility] The patient reports that she has never had HH and SNF in the past. The patient reports that she has a cane, walker, and shower seat in the home.  The patient reports that she has no concerns or questions during the time of the assessment. TOC will follow the patient until D/C.     Barriers to Discharge: Continued Medical Work up   Patient Goals and CMS Choice            Expected Discharge Plan and Services                                              Prior Living Arrangements/Services   Lives with:: Self Patient language and need for interpreter reviewed:: Yes        Need for Family Participation in Patient Care: Yes (Comment)     Criminal Activity/Legal Involvement Pertinent to Current Situation/Hospitalization: No - Comment as needed  Activities of Daily Living   ADL Screening (condition at time of admission) Independently performs ADLs?: Yes (appropriate for developmental age) Is the patient deaf or have difficulty hearing?: Yes (wears hearing aids.  pt states they hurt my ears. wears on occasion.) Does the patient have difficulty seeing, even when wearing glasses/contacts?: No Does the patient have difficulty concentrating, remembering, or making decisions?: Yes  Permission Sought/Granted                  Emotional Assessment Appearance:: Appears stated age Attitude/Demeanor/Rapport: Gracious Affect (typically observed): Calm Orientation: : Oriented to Self, Oriented to  Time, Oriented to Place, Oriented to Situation Alcohol / Substance Use: Not Applicable Psych Involvement: No (comment)  Admission diagnosis:  Acute respiratory failure with hypoxia (HCC) [J96.01] Acute hypoxic respiratory failure (HCC) [J96.01] COVID-19 virus infection [U07.1] Pneumonia due to COVID-19 virus [U07.1, J12.82] Patient Active Problem List   Diagnosis Date Noted   Acute hypoxic respiratory failure (HCC) 10/02/2024   Hyperlipidemia 10/02/2024   Ventral hernia 10/02/2024   S/P total left hip arthroplasty 07/29/2024   Acute CVA (cerebrovascular accident) (HCC) 06/04/2021   Stroke (HCC) 06/03/2021   Hypertensive urgency 06/03/2021   Sinus bradycardia 06/03/2021   Cancer of cecum (HCC) 10/04/2017   Normocytic anemia 10/04/2017   A-fib (HCC) 06/16/2015   Cancer of colon (HCC) 06/16/2015   DD (diverticular disease) 06/16/2015   Esophageal ulcer 06/16/2015   Hallux abducto  valgus 06/16/2015   Hammer toe 06/16/2015   H/O varicella 06/16/2015   HK (hyperkeratosis) 06/16/2015   Neuritis or radiculitis due to rupture of lumbar intervertebral disc 06/16/2015   Cancer of lung (HCC) 06/16/2015   Degenerative arthritis of spine 06/16/2015   SCC (squamous cell carcinoma of esophagus) (HCC) 06/16/2015   Supraventricular tachycardia (HCC) 06/16/2015   Temporary cerebral vascular dysfunction 06/16/2015   Familial multiple lipoprotein-type hyperlipidemia 03/27/2015   Clinical depression 03/27/2015   Acid reflux 03/27/2015   Constipation 03/27/2015    Ulcerative cystitis 03/27/2015   Routine general medical examination at a health care facility 03/27/2015   Allergic rhinitis 03/27/2015   H/O malignant neoplasm 03/27/2015   DDD (degenerative disc disease), lumbar 03/27/2015   Essential (primary) hypertension 03/27/2015   Cephalalgia 03/27/2015   Discitis of lumbar region 03/27/2015   Hematuria 03/27/2015   H/O: HTN (hypertension) 03/27/2015   Menopause 03/27/2015   Screening for depression 03/27/2015   Sinus infection 03/27/2015   FOM (frequency of micturition) 03/27/2015   Arrhythmia, ventricular 03/27/2015   Encounter for mammogram to establish baseline mammogram 03/27/2015   Atrial tachycardia 03/27/2015   Lumbar and sacral osteoarthritis 11/03/2014   Atrial fibrillation (HCC) 09/18/2014   Cancer of esophagus (HCC) 06/25/2014   Malignant neoplasm of esophagus (HCC) 06/25/2014   PCP:  Bertrum Charlie CROME, MD Pharmacy:   [facility] [address], [place] - [address] [place] [address] Phone: [phone] Fax: [phone]  [facility] [address] [place] - [address] [place] [address] Phone: [phone] Fax: [phone]  [id number] [place] - [address] AT [facility] [address] [facility] [place] [address] Phone: [phone] Fax: [phone]  [facility] - [place], [place] - [address] [place] [address] Phone: [phone] Fax: [phone]     Social Drivers of Health (SDOH) Social History: SDOH Screenings   Food Insecurity: No Food Insecurity (10/03/2024)  Housing: Low Risk  (10/03/2024)  Transportation Needs: No Transportation Needs (10/03/2024)  Utilities: Not At Risk (10/03/2024)  Financial Resource Strain: Low Risk  (06/12/2024)   Received from [facility]  Social Connections: Moderately Integrated (10/03/2024)  Tobacco Use: Medium Risk  (10/02/2024)  Health Literacy: Low Risk (02/28/2021)   [facility] from [facility]   SDOH Interventions:     Readmission Risk Interventions    10/03/2024   12:03 PM  Readmission Risk Prevention Plan  Transportation Screening Complete  PCP or Specialist Appt within 5-7 Days Complete  Home Care Screening Complete  Medication Review (RN CM) Complete

## 2024-10-03 NOTE — Progress Notes (Signed)
 Pt tolerated [place] while taking po meds, then bipap replaced per pt request.

## 2024-10-03 NOTE — Progress Notes (Signed)
 Progress Note   Patient: Kelly Krause FMW:[id number] DOB: 1941/10/25 DOA: 10/02/2024     1 DOS: the patient was seen and examined on 10/03/2024   Brief hospital course: Ms. Kelly Krause is a 83 year old female with history of atrial fibrillation on Eliquis , hyperlipidemia, depression, hypertension, GERD, who presents ED for chief concerns of shortness of breath. She has a severe hypoxemia with oxygen saturation at 85%, she was treated with BiPAP overnight. She was also found to have possible COVID, due to acute respite failure, patient was started on IV steroids. Patient was also found to have a BNP of 4519, elevated troponin 74, she was given 80 mg IV Lasix .  Patient was in ICU overnight 11/12, condition has improved, she is currently on 2 L oxygen, transferred to progressive unit 11/13.    Principal Problem:   Acute hypoxic respiratory failure (HCC) Active Problems:   Ventral hernia   Familial multiple lipoprotein-type hyperlipidemia   Clinical depression   Acid reflux   Essential (primary) hypertension   SCC (squamous cell carcinoma of esophagus) (HCC)   Atrial fibrillation (HCC)   Malignant neoplasm of esophagus (HCC)   Hyperlipidemia   Assessment and Plan: * Acute hypoxic respiratory failure (HCC) secondary to viral pneumonia from COVID, acute on chronic congestive heart failure. COVID-pneumonia. Acute on chronic likely diastolic congestive heart failure Elevated troponin secondary to demand ischemia from acute respiratory failure. Patient had a severe hypoxemia with significant shortness of breath, was requiring BiPAP overnight.  She was given 80 mg IV Lasix , her condition appear to be improving.  She is currently on 2 L of oxygen over nasal cannula. Current respiratory failure was caused by combination of COVID-pneumonia and, acute congestive heart failure. I personally reviewed patient chest x-ray, reported as multifocal, however, this could also seen with congestive  heart failure.  BNP profoundly elevated with normal renal function, indicating severe volume overload.  She received 80 mg IV Lasix , I will continue 40 mg IV twice a day. She had echocardiogram performed in 2022, ejection fraction was 60 to 65%, repeat echocardiogram pending. Patient also receiving IV steroids for COVID-pneumonia.  She unlikely has any secondary bacterial pneumonia, procalcitonin level less than 0.1. I will discontinue antibiotics. She is not a candidate for remdesivir, her symptoms started more than a week ago. Patient condition has improved, will transfer to progressive unit.  Metabolic acidosis. Hypokalemia. Potassium has normalized.  Continue to follow.  Ventral hernia Lower abdominal ventral hernia is present Stable, no incarceration.  Hyperlipidemia Home atorvastatin  40 mg nightly resumed  Paroxysmal atrial fibrillation (HCC) Home Eliquis  5 mg p.o. twice daily, sotalol  80 mg p.o. twice daily, diltiazem 30 mg resumed on admission Reviewed EKG and the telemetry monitor, currently in sinus tachycardia.  Essential (primary) hypertension Continue home medicines.  Acid reflux Home PPI twice daily resumed  Major depression Venlafaxine  150 mg daily with breakfast and buspirone  7.5 mg p.o. twice daily were resumed on        Subjective:  Patient feel much better today, short of breath improved.  No cough.  Physical Exam: Vitals:   10/03/24 0930 10/03/24 0942 10/03/24 1000 10/03/24 1012  BP:  (!) 141/64 128/64   Pulse: 76 75 76 79  Resp: 16 18 20 18   Temp:      TempSrc:      SpO2: 97% 96% 98% 98%  Weight:      Height:       General exam: Appears calm and comfortable  Respiratory system: Decreased breath sounds.  Respiratory effort normal. Cardiovascular system: S1 & S2 heard, RRR. No JVD, murmurs, rubs, gallops or clicks. No pedal edema. Gastrointestinal system: Abdomen is nondistended, soft and nontender. No organomegaly or masses felt. Normal bowel  sounds heard. Central nervous system: Alert and oriented x3. No focal neurological deficits. Extremities: Symmetric 5 x 5 power. Skin: No rashes, lesions or ulcers Psychiatry: Judgement and insight appear normal. Mood & affect appropriate.    Data Reviewed:  Reviewed chest x-ray results and image, reviewed lab results.  Family Communication: son updated over the phone  Disposition: Status is: Inpatient Remains inpatient appropriate because: Severity of disease, IV treatment.     Time spent: 55 minutes  Author: Murvin Mana, MD 10/03/2024 10:44 AM  For on call review [email or web address].

## 2024-10-03 NOTE — Progress Notes (Signed)
 Pt's blood pressure has started to get soft, Precedex stopped and ntg paste removed.

## 2024-10-03 NOTE — Progress Notes (Signed)
 Patient complaining of weird feeling in throat and ears after drinking a few small sips of chocolate ensure. Vitals obtained - stable. 98% on room air. Lungs clear. MD Zhang aware. Plan of care is to eat ice chips and monitor airway for any reactions.

## 2024-10-04 DIAGNOSIS — I5033 Acute on chronic diastolic (congestive) heart failure: Secondary | ICD-10-CM | POA: Diagnosis not present

## 2024-10-04 DIAGNOSIS — J9602 Acute respiratory failure with hypercapnia: Secondary | ICD-10-CM

## 2024-10-04 DIAGNOSIS — U071 COVID-19: Secondary | ICD-10-CM | POA: Diagnosis not present

## 2024-10-04 DIAGNOSIS — J9601 Acute respiratory failure with hypoxia: Secondary | ICD-10-CM | POA: Diagnosis not present

## 2024-10-04 LAB — BASIC METABOLIC PANEL WITH GFR
Anion gap: 10 (ref 5–15)
BUN: 31 mg/dL — ABNORMAL HIGH (ref 8–23)
CO2: 29 mmol/L (ref 22–32)
Calcium: 8.9 mg/dL (ref 8.9–10.3)
Chloride: 101 mmol/L (ref 98–111)
Creatinine, Ser: 0.78 mg/dL (ref 0.44–1.00)
GFR, Estimated: >60 mL/min (ref >60)
Glucose, Bld: 133 mg/dL — ABNORMAL HIGH (ref 70–99)
Potassium: 3.6 mmol/L (ref 3.5–5.1)
Sodium: 139 mmol/L (ref 135–145)

## 2024-10-04 LAB — CBC
HCT: 34.6 % — ABNORMAL LOW (ref 36.0–46.0)
Hemoglobin: 11.3 g/dL — ABNORMAL LOW (ref 12.0–15.0)
MCH: 31.4 pg (ref 26.0–34.0)
MCHC: 32.7 g/dL (ref 30.0–36.0)
MCV: 96.1 fL (ref 80.0–100.0)
Platelets: 280 K/uL (ref 150–400)
RBC: 3.6 MIL/uL — ABNORMAL LOW (ref 3.87–5.11)
RDW: 14 % (ref 11.5–15.5)
WBC: 10.7 K/uL — ABNORMAL HIGH (ref 4.0–10.5)
nRBC: 0 % (ref 0.0–0.2)

## 2024-10-04 LAB — MAGNESIUM: Magnesium: 2.1 mg/dL (ref 1.7–2.4)

## 2024-10-04 LAB — GLUCOSE, CAPILLARY
Glucose-Capillary: 113 mg/dL — ABNORMAL HIGH (ref 70–99)
Glucose-Capillary: 132 mg/dL — ABNORMAL HIGH (ref 70–99)
Glucose-Capillary: 163 mg/dL — ABNORMAL HIGH (ref 70–99)
Glucose-Capillary: 183 mg/dL — ABNORMAL HIGH (ref 70–99)

## 2024-10-04 LAB — LEGIONELLA PNEUMOPHILA SEROGP 1 UR AG: L. pneumophila Serogp 1 Ur Ag: NEGATIVE

## 2024-10-04 MED ORDER — INFLUENZA VAC SPLIT HIGH-DOSE 0.5 ML IM SUSY
0.5000 mL | PREFILLED_SYRINGE | INTRAMUSCULAR | Status: DC
  Filled 2024-10-04: qty 0.5

## 2024-10-04 MED ORDER — LACTULOSE 10 GM/15ML PO SOLN
20.0000 g | Freq: Once | ORAL | Status: AC
  Administered 2024-10-04: 20 g via ORAL
  Filled 2024-10-04: qty 30

## 2024-10-04 NOTE — Progress Notes (Signed)
 Progress Note   Patient: Kelly Krause FMW:[id number] DOB: [birth year] DOA: 10/02/2024     2 DOS: the patient was seen and examined on 10/04/2024   Brief hospital course: Ms. Kelly Krause is a 83 year old female with history of atrial fibrillation on Eliquis , hyperlipidemia, depression, hypertension, GERD, who presents ED for chief concerns of shortness of breath. She has a severe hypoxemia with oxygen saturation at 85%, she was treated with BiPAP overnight. She was also found to have possible COVID, due to acute respite failure, patient was started on IV steroids. Patient was also found to have a BNP of 4519, elevated troponin 74, she was given 80 mg IV Lasix .  Patient was in ICU overnight 11/12, condition has improved, she is currently on 2 L oxygen, transferred to progressive unit 11/13.    Principal Problem:   Acute respiratory failure with hypoxia and hypercapnia (HCC) Active Problems:   Ventral hernia   Familial multiple lipoprotein-type hyperlipidemia   Clinical depression   Acid reflux   Essential (primary) hypertension   SCC (squamous cell carcinoma of esophagus) (HCC)   Atrial fibrillation (HCC)   Malignant neoplasm of esophagus (HCC)   Hyperlipidemia   Assessment and Plan: * Acute respiratory failure with hypoxemia and hypercapnia.  Secondary to viral pneumonia from COVID, acute on chronic congestive heart failure. COVID-pneumonia. Acute on chronic likely diastolic congestive heart failure Elevated troponin secondary to demand ischemia from acute respiratory failure. Patient had a severe hypoxemia with significant shortness of breath, was requiring BiPAP overnight.  She was given 80 mg IV Lasix , her condition appear to be improving.  She is currently on 2 L of oxygen over nasal cannula. Current respiratory failure was caused by combination of COVID-pneumonia and, acute congestive heart failure. I personally reviewed patient chest x-ray, reported as multifocal,  however, this could also seen with congestive heart failure.  BNP profoundly elevated with normal renal function, indicating severe volume overload.  She received 80 mg IV Lasix , continued 40 mg IV twice a day. She had echocardiogram performed in 2022, ejection fraction was 60 to 65%, repeat echocardiogram still showed ejection fraction 60 to 65% with moderate mitral valve regurgitation. Patient also receiving IV steroids for COVID-pneumonia.  She unlikely has any secondary bacterial pneumonia, procalcitonin level less than 0.1. I discontinued antibiotics. She is not a candidate for remdesivir, her symptoms started more than a week ago. Patient was transferred to stepdown unit from ICU on 11/13. Patient continued to improve, currently on 2 L oxygen, short of breath slightly improved.  Will try to wean off oxygen, I will discontinue Foley catheter. Patient will benefit for another day of IV Lasix .  Monitor renal function closely.  Acute encephalopathy secondary to severe hypoxemia and hypercapnia. Patient had a significant confusion at the time of admission.  This is most likely due to CO2 retention and hypoxemia.  Condition has improved since.   Metabolic acidosis. Hypokalemia. Condition resolved.   Ventral hernia Lower abdominal ventral hernia is present Stable, no incarceration.   Hyperlipidemia Home atorvastatin  40 mg nightly resumed   Paroxysmal atrial fibrillation (HCC) Home Eliquis  5 mg p.o. twice daily, sotalol  80 mg p.o. twice daily, diltiazem 30 mg resumed on admission Reviewed EKG and the telemetry monitor, currently in sinus tachycardia.   Essential (primary) hypertension Continue home medicines.   Acid reflux Home PPI twice daily resumed   Major depression Venlafaxine  150 mg daily with breakfast and buspirone  7.5 mg p.o. twice daily were resumed on  Subjective:  Patient doing much better, short of breath much improved.  Patient currently on 2 L  oxygen.  Feel constipated.  Lactulose  given.  Physical Exam: Vitals:   10/03/24 2319 10/04/24 0459 10/04/24 0500 10/04/24 0700  BP: 131/85 (!) 159/71  (!) 142/78  Pulse:  63    Resp:  20    Temp: 98.1 F (36.7 C) 98.9 F (37.2 C)  (!) 97.5 F (36.4 C)  TempSrc: Axillary   Axillary  SpO2:  99%    Weight:   72.6 kg   Height:       General exam: Appears calm and comfortable  Respiratory system: Clear to auscultation. Respiratory effort normal. Cardiovascular system: S1 & S2 heard, RRR. No JVD, murmurs, rubs, gallops or clicks. No pedal edema. Gastrointestinal system: Abdomen is nondistended, soft and nontender. No organomegaly or masses felt. Normal bowel sounds heard. Central nervous system: Alert and oriented. No focal neurological deficits. Extremities: Symmetric 5 x 5 power. Skin: No rashes, lesions or ulcers Psychiatry: Judgement and insight appear normal. Mood & affect appropriate.    Data Reviewed:  Lab results reviewed.  Family Communication: None  Disposition: Status is: Inpatient Remains inpatient appropriate because: Severity of disease, IV treatment.     Time spent: 35 minutes  Author: Murvin Mana, MD 10/04/2024 1:42 PM  For on call review [email or web address].

## 2024-10-04 NOTE — Plan of Care (Signed)
  Problem: Education: Goal: Knowledge of risk factors and measures for prevention of condition will improve Outcome: Progressing   Problem: Coping: Goal: Psychosocial and spiritual needs will be supported Outcome: Progressing   Problem: Respiratory: Goal: Will maintain a patent airway Outcome: Progressing Goal: Complications related to the disease process, condition or treatment will be avoided or minimized Outcome: Progressing   Problem: Education: Goal: Knowledge of General Education information will improve Description: Including pain rating scale, medication(s)/side effects and non-pharmacologic comfort measures Outcome: Progressing   Problem: Health Behavior/Discharge Planning: Goal: Ability to manage health-related needs will improve Outcome: Progressing   Problem: Clinical Measurements: Goal: Ability to maintain clinical measurements within normal limits will improve Outcome: Progressing Goal: Will remain free from infection Outcome: Progressing Goal: Diagnostic test results will improve Outcome: Progressing Goal: Respiratory complications will improve Outcome: Progressing Goal: Cardiovascular complication will be avoided Outcome: Progressing   Problem: Activity: Goal: Risk for activity intolerance will decrease Outcome: Progressing   Problem: Nutrition: Goal: Adequate nutrition will be maintained Outcome: Progressing   Problem: Coping: Goal: Level of anxiety will decrease Outcome: Progressing   Problem: Elimination: Goal: Will not experience complications related to bowel motility Outcome: Progressing Goal: Will not experience complications related to urinary retention Outcome: Progressing   Problem: Pain Managment: Goal: General experience of comfort will improve and/or be controlled Outcome: Progressing   Problem: Safety: Goal: Ability to remain free from injury will improve Outcome: Progressing   Problem: Skin Integrity: Goal: Risk for impaired  skin integrity will decrease Outcome: Progressing   Problem: Education: Goal: Ability to describe self-care measures that may prevent or decrease complications (Diabetes Survival Skills Education) will improve Outcome: Progressing Goal: Individualized Educational Video(s) Outcome: Progressing   Problem: Coping: Goal: Ability to adjust to condition or change in health will improve Outcome: Progressing   Problem: Fluid Volume: Goal: Ability to maintain a balanced intake and output will improve Outcome: Progressing   Problem: Health Behavior/Discharge Planning: Goal: Ability to identify and utilize available resources and services will improve Outcome: Progressing Goal: Ability to manage health-related needs will improve Outcome: Progressing   Problem: Metabolic: Goal: Ability to maintain appropriate glucose levels will improve Outcome: Progressing   Problem: Nutritional: Goal: Maintenance of adequate nutrition will improve Outcome: Progressing Goal: Progress toward achieving an optimal weight will improve Outcome: Progressing   Problem: Skin Integrity: Goal: Risk for impaired skin integrity will decrease Outcome: Progressing   Problem: Tissue Perfusion: Goal: Adequacy of tissue perfusion will improve Outcome: Progressing

## 2024-10-04 NOTE — Care Management Important Message (Signed)
 Important Message  Patient Details  Name: Kelly Krause MRN: [id number] Date of Birth: 1941/04/13   Important Message Given:  Yes - Medicare IM     [name] 10/04/2024, 4:18 PM

## 2024-10-05 DIAGNOSIS — I5033 Acute on chronic diastolic (congestive) heart failure: Secondary | ICD-10-CM | POA: Diagnosis not present

## 2024-10-05 DIAGNOSIS — J9601 Acute respiratory failure with hypoxia: Secondary | ICD-10-CM | POA: Diagnosis not present

## 2024-10-05 DIAGNOSIS — J9602 Acute respiratory failure with hypercapnia: Secondary | ICD-10-CM | POA: Diagnosis not present

## 2024-10-05 DIAGNOSIS — U071 COVID-19: Secondary | ICD-10-CM | POA: Diagnosis not present

## 2024-10-05 LAB — GLUCOSE, CAPILLARY
Glucose-Capillary: 118 mg/dL — ABNORMAL HIGH (ref 70–99)
Glucose-Capillary: 153 mg/dL — ABNORMAL HIGH (ref 70–99)
Glucose-Capillary: 167 mg/dL — ABNORMAL HIGH (ref 70–99)
Glucose-Capillary: 282 mg/dL — ABNORMAL HIGH (ref 70–99)
Glucose-Capillary: 79 mg/dL (ref 70–99)
Glucose-Capillary: 94 mg/dL (ref 70–99)
Glucose-Capillary: 96 mg/dL (ref 70–99)

## 2024-10-05 LAB — BASIC METABOLIC PANEL WITH GFR
Anion gap: 10 (ref 5–15)
BUN: 29 mg/dL — ABNORMAL HIGH (ref 8–23)
CO2: 32 mmol/L (ref 22–32)
Calcium: 8.8 mg/dL — ABNORMAL LOW (ref 8.9–10.3)
Chloride: 99 mmol/L (ref 98–111)
Creatinine, Ser: 0.74 mg/dL (ref 0.44–1.00)
GFR, Estimated: >60 mL/min (ref >60)
Glucose, Bld: 101 mg/dL — ABNORMAL HIGH (ref 70–99)
Potassium: 3.2 mmol/L — ABNORMAL LOW (ref 3.5–5.1)
Sodium: 141 mmol/L (ref 135–145)

## 2024-10-05 LAB — MAGNESIUM: Magnesium: 1.9 mg/dL (ref 1.7–2.4)

## 2024-10-05 MED ORDER — SODIUM CHLORIDE 0.9% FLUSH
10.0000 mL | Freq: Two times a day (BID) | INTRAVENOUS | Status: DC
  Administered 2024-10-05 – 2024-10-06 (×2): 20 mL
  Administered 2024-10-06: 10 mL
  Administered 2024-10-07: 30 mL

## 2024-10-05 MED ORDER — HYDROXYZINE HCL 25 MG PO TABS
25.0000 mg | ORAL_TABLET | Freq: Three times a day (TID) | ORAL | Status: DC | PRN
  Administered 2024-10-05: 25 mg via ORAL
  Filled 2024-10-05: qty 1

## 2024-10-05 MED ORDER — MENTHOL 3 MG MT LOZG: 1.0000 | LOZENGE | OROMUCOSAL | Status: DC | PRN

## 2024-10-05 MED ORDER — POTASSIUM CHLORIDE CRYS ER 20 MEQ PO TBCR
40.0000 meq | EXTENDED_RELEASE_TABLET | Freq: Three times a day (TID) | ORAL | Status: AC
  Administered 2024-10-05 (×2): 40 meq via ORAL
  Filled 2024-10-05 (×3): qty 2

## 2024-10-05 MED ORDER — FLEET ENEMA RE ENEM
1.0000 | ENEMA | Freq: Once | RECTAL | Status: AC
  Administered 2024-10-05: 1 via RECTAL

## 2024-10-05 MED ORDER — SODIUM CHLORIDE 0.9% FLUSH: 10.0000 mL | INTRAVENOUS | Status: DC | PRN

## 2024-10-05 MED ORDER — SENNOSIDES-DOCUSATE SODIUM 8.6-50 MG PO TABS
2.0000 | ORAL_TABLET | Freq: Two times a day (BID) | ORAL | Status: DC
  Administered 2024-10-05 – 2024-10-07 (×4): 2 via ORAL
  Filled 2024-10-05 (×5): qty 2

## 2024-10-05 MED ORDER — CHLORHEXIDINE GLUCONATE CLOTH 2 % EX PADS
6.0000 | MEDICATED_PAD | Freq: Every day | CUTANEOUS | Status: DC
  Administered 2024-10-05 – 2024-10-07 (×3): 6 via TOPICAL

## 2024-10-05 MED ORDER — POTASSIUM CHLORIDE 20 MEQ PO PACK
40.0000 meq | PACK | Freq: Once | ORAL | Status: AC
  Administered 2024-10-05: 40 meq via ORAL
  Filled 2024-10-05: qty 2

## 2024-10-05 MED ORDER — INFLUENZA VAC SPLIT HIGH-DOSE 0.5 ML IM SUSY
0.5000 mL | PREFILLED_SYRINGE | INTRAMUSCULAR | Status: DC
  Filled 2024-10-05: qty 0.5

## 2024-10-05 NOTE — Evaluation (Signed)
 Physical Therapy Evaluation Patient Details Name: Kelly Krause MRN: [id number] DOB: 1941/05/03 Today's Date: 10/05/2024  History of Present Illness  Pt is an 83 y/o M admitted on 10/02/24 after presenting with c/o SOB. Pt is being treated for acute respiratory failure with hypoxemia & hypercapnia 2/2 viral PNA from COVID, acute on chronic CHF. PMH: a-fib on Eliquis , HLD, depression, HTN, GERD, L THA (Sept 2025), colon CA  Clinical Impression  Pt seen for PT evaluation with pt agreeable to tx. Pt reports prior to admission she was mod I with cane, notes 3-4 falls in the past 6 months, lives alone with no one available to assist. On this date, pt is able to ambulate around nurses station with RW & supervision, slow, steady gait speed. Pt received on room air with SPO2 dropping to 86% after gait, required 1L/min to increase to 92%; PT educated pt on pursed lip breathing. Recommend ongoing PT services to progress mobility as able.        If plan is discharge home, recommend the following: A little help with bathing/dressing/bathroom;Assistance with cooking/housework;Assist for transportation;Help with stairs or ramp for entrance;A little help with walking and/or transfers   Can travel by private vehicle        Equipment Recommendations Rolling walker (2 wheels)  Recommendations for Other Services       Functional Status Assessment Patient has had a recent decline in their functional status and demonstrates the ability to make significant improvements in function in a reasonable and predictable amount of time.     Precautions / Restrictions Precautions Precautions: Fall Precaution/Restrictions Comments: monitor O2 Restrictions Weight Bearing Restrictions Per Provider Order: No      Mobility  Bed Mobility Overal bed mobility: Needs Assistance Bed Mobility: Supine to Sit     Supine to sit: Modified independent (Device/Increase time), HOB elevated, Used rails (exit R side of bed)           Transfers Overall transfer level: Needs assistance Equipment used: Rolling walker (2 wheels) Transfers: Sit to/from Stand Sit to Stand: Supervision           General transfer comment: education re: hand placement to push to standing vs pulling on RW    Ambulation/Gait Ambulation/Gait assistance: Supervision Gait Distance (Feet): 160 Feet Assistive device: Rolling walker (2 wheels) Gait Pattern/deviations: Decreased step length - right, Decreased stride length, Decreased step length - left Gait velocity: decreased        Stairs            Wheelchair Mobility     Tilt Bed    Modified Rankin (Stroke Patients Only)       Balance Overall balance assessment: Needs assistance Sitting-balance support: No upper extremity supported, Feet supported Sitting balance-Leahy Scale: Good     Standing balance support: During functional activity, Bilateral upper extremity supported, Reliant on assistive device for balance Standing balance-Leahy Scale: Good                               Pertinent Vitals/Pain Pain Assessment Pain Assessment: No/denies pain    Home Living Family/patient expects to be discharged to:: Private residence Living Arrangements: Alone   Type of Home: House Home Access: Stairs to enter Entrance Stairs-Rails: None Entrance Stairs-Number of Steps: 1   Home Layout: One level Home Equipment: Pharmacist, Hospital (2 wheels);Cane - single point;Rollator (4 wheels)      Prior Function Prior Level of Function :  Independent/Modified Independent             Mobility Comments: Reports she's ambulatory with SPC but notes I fall all the time stating 3-4 falls in the past 6 months, still driving. ADLs Comments: Independent. Reports she recently got a shower chair but isn't sure how to put it together.     Extremity/Trunk Assessment   Upper Extremity Assessment Upper Extremity Assessment: Overall WFL for tasks  assessed    Lower Extremity Assessment Lower Extremity Assessment: Overall WFL for tasks assessed;Generalized weakness       Communication   Communication Communication: No apparent difficulties Factors Affecting Communication: Hearing impaired    Cognition Arousal: Alert Behavior During Therapy: WFL for tasks assessed/performed   PT - Cognitive impairments: No apparent impairments                         Following commands: Intact       Cueing Cueing Techniques: Verbal cues     General Comments General comments (skin integrity, edema, etc.): Encouraged use of RW at home 2/2 pt reporting hx of falls.    Exercises     Assessment/Plan    PT Assessment Patient needs continued PT services  PT Problem List Decreased strength;Cardiopulmonary status limiting activity;Decreased activity tolerance;Decreased balance;Decreased mobility;Decreased range of motion;Decreased knowledge of use of DME;Decreased safety awareness       PT Treatment Interventions DME instruction;Balance training;Neuromuscular re-education;Gait training;Therapeutic activities;Stair training;Functional mobility training;Patient/family education;Manual techniques;Therapeutic exercise    PT Goals (Current goals can be found in the Care Plan section)  Acute Rehab PT Goals Patient Stated Goal: get better PT Goal Formulation: With patient Time For Goal Achievement: 10/19/24 Potential to Achieve Goals: Good    Frequency Min 2X/week     Co-evaluation               AM-PAC PT 6 Clicks Mobility  Outcome Measure Help needed turning from your back to your side while in a flat bed without using bedrails?: None Help needed moving from lying on your back to sitting on the side of a flat bed without using bedrails?: A Little Help needed moving to and from a bed to a chair (including a wheelchair)?: A Little Help needed standing up from a chair using your arms (e.g., wheelchair or bedside  chair)?: A Little Help needed to walk in hospital room?: A Little Help needed climbing 3-5 steps with a railing? : A Little 6 Click Score: 19    End of Session Equipment Utilized During Treatment: Oxygen Activity Tolerance: Patient tolerated treatment well Patient left: in chair;with chair alarm set;with call bell/phone within reach Nurse Communication: Mobility status (O2) PT Visit Diagnosis: Muscle weakness (generalized) (M62.81);History of falling (Z91.81)    Time: 8945-8885 PT Time Calculation (min) (ACUTE ONLY): 20 min   Charges:   PT Evaluation $PT Eval Low Complexity: 1 Low   PT General Charges $$ ACUTE PT VISIT: 1 Visit         Kelly Krause, PT, DPT 10/05/24, 11:25 AM   Kelly Krause 10/05/2024, 11:24 AM

## 2024-10-05 NOTE — Progress Notes (Signed)
 Patient complaint of itching all over her arms, legs and trunk. No visible rash observed but redness noted due to patient scratching. Patient took a bath to feel better. Dr. Laurita notified of patient complaint. New order for Atarax received and given to patient. Patient has multiple allergies in the chart but patient reports that she feels it may be from the Vanilla Ensure that this RN gave her to drink. Patient states she has had an allergy to milk products in the past. Patient request to move the flu vaxx to tomorrow and the enema to later in the afternoon until her itchy skin improves.   This RN requested that pharmacy adjust the times of those medications.

## 2024-10-05 NOTE — Plan of Care (Signed)
  Problem: Respiratory: Goal: Will maintain a patent airway Outcome: Progressing Goal: Complications related to the disease process, condition or treatment will be avoided or minimized Outcome: Progressing   Problem: Clinical Measurements: Goal: Ability to maintain clinical measurements within normal limits will improve Outcome: Progressing Goal: Will remain free from infection Outcome: Progressing Goal: Diagnostic test results will improve Outcome: Progressing Goal: Respiratory complications will improve Outcome: Progressing Goal: Cardiovascular complication will be avoided Outcome: Progressing

## 2024-10-05 NOTE — Progress Notes (Signed)
 Progress Note   Patient: Kelly Krause FMW:[id number] DOB: October 05, 1941 DOA: 10/02/2024     3 DOS: the patient was seen and examined on 10/05/2024   Brief hospital course: Ms. Sehaj Kolden is a 83 year old female with history of atrial fibrillation on Eliquis , hyperlipidemia, depression, hypertension, GERD, who presents ED for chief concerns of shortness of breath. She has a severe hypoxemia with oxygen saturation at 85%, she was treated with BiPAP overnight. She was also found to have possible COVID, due to acute respite failure, patient was started on IV steroids. Patient was also found to have a BNP of 4519, elevated troponin 74, she was given 80 mg IV Lasix .  Patient was in ICU overnight 11/12, condition has improved, she is currently on 2 L oxygen, transferred to progressive unit 11/13.    Principal Problem:   Acute respiratory failure with hypoxia and hypercapnia (HCC) Active Problems:   Ventral hernia   Familial multiple lipoprotein-type hyperlipidemia   Clinical depression   Acid reflux   Essential (primary) hypertension   SCC (squamous cell carcinoma of esophagus) (HCC)   Atrial fibrillation (HCC)   Malignant neoplasm of esophagus (HCC)   Hyperlipidemia   Pneumonia due to COVID-19 virus   Acute on chronic diastolic CHF (congestive heart failure) (HCC)   Assessment and Plan: * Acute respiratory failure with hypoxemia and hypercapnia.  Secondary to viral pneumonia from COVID, acute on chronic congestive heart failure. COVID-pneumonia. Acute on chronic likely diastolic congestive heart failure Elevated troponin secondary to demand ischemia from acute respiratory failure. Patient had a severe hypoxemia with significant shortness of breath, was requiring BiPAP overnight.  She was given 80 mg IV Lasix , her condition appear to be improving.  She is currently on 2 L of oxygen over nasal cannula. Current respiratory failure was caused by combination of COVID-pneumonia and, acute  congestive heart failure. I personally reviewed patient chest x-ray, reported as multifocal, however, this could also seen with congestive heart failure.  BNP profoundly elevated with normal renal function, indicating severe volume overload.  She received 80 mg IV Lasix , continued 40 mg IV twice a day. She had echocardiogram performed in 2022, ejection fraction was 60 to 65%, repeat echocardiogram still showed ejection fraction 60 to 65% with moderate mitral valve regurgitation. Patient also receiving IV steroids for COVID-pneumonia.  She unlikely has any secondary bacterial pneumonia, procalcitonin level less than 0.1. I discontinued antibiotics. She is not a candidate for remdesivir, her symptoms started more than a week ago. Patient was transferred to stepdown unit from ICU on 11/13. Condition has improved, volume status much better.  Renal function still normal, I will discontinue diuretics, echocardiogram did not show any diastolic dysfunction.  Volume overload could be due to COVID. Patient currently off oxygen.  Will continue complete 10-day course of steroids.  Severe sore throat from COVID. Dysphagia secondary to sore throat. History of esophageal cancer. Constipation with nausea. Patient has severe sore throat from COVID, hard to swallow.  As a result, patient has not been able to eat.  Started lozenge.  She also had a history of esophageal cancer, but she did not have significant dysphagia before COVID.  I will hold off workup for esophageal stenosis. Patient also has been nausea, has not had a bowel movement after lactulose .  Will give a Fleet enema, continue senna.   Acute encephalopathy secondary to severe hypoxemia and hypercapnia. Patient had a significant confusion at the time of admission.  This is most likely due to CO2 retention and hypoxemia.  Condition has improved since.   Metabolic acidosis. Hypokalemia. Potassium is 3.2, will continue oral supplement.  Recheck level  tomorrow.   Ventral hernia Lower abdominal ventral hernia is present Stable, no incarceration.   Hyperlipidemia Home atorvastatin  40 mg nightly resumed   Paroxysmal atrial fibrillation (HCC) Home Eliquis  5 mg p.o. twice daily, sotalol  80 mg p.o. twice daily, diltiazem 30 mg resumed on admission Patient currently in sinus rhythm.   Essential (primary) hypertension Continue home medicines.   Acid reflux Home PPI twice daily resumed   Major depression Venlafaxine  150 mg daily with breakfast and buspirone  7.5 mg p.o. twice daily were resumed.          Subjective:  Patient complaining of sore throat, could not swallow because of a sore throat. She also has some nausea, has not had a bowel movement since admission, even after lactulose . No longer has short of breath, off oxygen with good saturations this morning.  Physical Exam: Vitals:   10/05/24 0003 10/05/24 0500 10/05/24 0505 10/05/24 0827  BP: (!) 154/76  (!) 153/68 (!) 172/73  Pulse: 60  66 (!) 58  Resp: 16  16 18   Temp: 98.1 F (36.7 C)  98.6 F (37 C) 97.8 F (36.6 C)  TempSrc: Oral  Oral   SpO2: 96%  99% 98%  Weight:  72.5 kg    Height:       General exam: Appears calm and comfortable  Respiratory system: Clear to auscultation. Respiratory effort normal. Cardiovascular system: S1 & S2 heard, RRR. No JVD, murmurs, rubs, gallops or clicks. No pedal edema. Gastrointestinal system: Abdomen is nondistended, soft and nontender. No organomegaly or masses felt. Normal bowel sounds heard. Central nervous system: Alert and oriented. No focal neurological deficits. Extremities: Symmetric 5 x 5 power. Skin: No rashes, lesions or ulcers Psychiatry: Judgement and insight appear normal. Mood & affect appropriate.    Data Reviewed:  Lab results reviewed.  Family Communication: Son updated over the phone.  Disposition: Status is: Inpatient Remains inpatient appropriate because: Severity of disease,     Time  spent: 50 minutes  Author: Murvin Mana, MD 10/05/2024 10:35 AM  For on call review [email or web address].

## 2024-10-06 DIAGNOSIS — J9602 Acute respiratory failure with hypercapnia: Secondary | ICD-10-CM | POA: Diagnosis not present

## 2024-10-06 DIAGNOSIS — U071 COVID-19: Secondary | ICD-10-CM | POA: Diagnosis not present

## 2024-10-06 DIAGNOSIS — J9601 Acute respiratory failure with hypoxia: Secondary | ICD-10-CM | POA: Diagnosis not present

## 2024-10-06 DIAGNOSIS — I5033 Acute on chronic diastolic (congestive) heart failure: Secondary | ICD-10-CM | POA: Diagnosis not present

## 2024-10-06 LAB — BASIC METABOLIC PANEL WITH GFR
Anion gap: 8 (ref 5–15)
BUN: 33 mg/dL — ABNORMAL HIGH (ref 8–23)
CO2: 33 mmol/L — ABNORMAL HIGH (ref 22–32)
Calcium: 8.9 mg/dL (ref 8.9–10.3)
Chloride: 102 mmol/L (ref 98–111)
Creatinine, Ser: 0.8 mg/dL (ref 0.44–1.00)
GFR, Estimated: >60 mL/min (ref >60)
Glucose, Bld: 106 mg/dL — ABNORMAL HIGH (ref 70–99)
Potassium: 4 mmol/L (ref 3.5–5.1)
Sodium: 143 mmol/L (ref 135–145)

## 2024-10-06 LAB — GLUCOSE, CAPILLARY
Glucose-Capillary: 104 mg/dL — ABNORMAL HIGH (ref 70–99)
Glucose-Capillary: 116 mg/dL — ABNORMAL HIGH (ref 70–99)
Glucose-Capillary: 134 mg/dL — ABNORMAL HIGH (ref 70–99)
Glucose-Capillary: 153 mg/dL — ABNORMAL HIGH (ref 70–99)
Glucose-Capillary: 164 mg/dL — ABNORMAL HIGH (ref 70–99)
Glucose-Capillary: 205 mg/dL — ABNORMAL HIGH (ref 70–99)

## 2024-10-06 MED ORDER — LACTULOSE 10 GM/15ML PO SOLN
20.0000 g | Freq: Once | ORAL | Status: DC
  Filled 2024-10-06: qty 30

## 2024-10-06 MED ORDER — DEXAMETHASONE 6 MG PO TABS
6.0000 mg | ORAL_TABLET | Freq: Every day | ORAL | Status: DC
  Administered 2024-10-07: 6 mg via ORAL
  Filled 2024-10-06: qty 1

## 2024-10-06 MED ORDER — DEXAMETHASONE 6 MG PO TABS: 6.0000 mg | ORAL_TABLET | Freq: Every day | ORAL | Status: DC

## 2024-10-06 NOTE — Plan of Care (Signed)
  Problem: Education: Goal: Knowledge of risk factors and measures for prevention of condition will improve Outcome: Progressing   Problem: Coping: Goal: Psychosocial and spiritual needs will be supported Outcome: Progressing   Problem: Respiratory: Goal: Will maintain a patent airway Outcome: Progressing Goal: Complications related to the disease process, condition or treatment will be avoided or minimized Outcome: Progressing   Problem: Education: Goal: Knowledge of General Education information will improve Description: Including pain rating scale, medication(s)/side effects and non-pharmacologic comfort measures Outcome: Progressing   Problem: Health Behavior/Discharge Planning: Goal: Ability to manage health-related needs will improve Outcome: Progressing   Problem: Clinical Measurements: Goal: Ability to maintain clinical measurements within normal limits will improve Outcome: Progressing Goal: Will remain free from infection Outcome: Progressing Goal: Diagnostic test results will improve Outcome: Progressing Goal: Respiratory complications will improve Outcome: Progressing Goal: Cardiovascular complication will be avoided Outcome: Progressing   Problem: Activity: Goal: Risk for activity intolerance will decrease Outcome: Progressing   Problem: Nutrition: Goal: Adequate nutrition will be maintained Outcome: Progressing   Problem: Coping: Goal: Level of anxiety will decrease Outcome: Progressing   Problem: Elimination: Goal: Will not experience complications related to bowel motility Outcome: Progressing Goal: Will not experience complications related to urinary retention Outcome: Progressing   Problem: Pain Managment: Goal: General experience of comfort will improve and/or be controlled Outcome: Progressing   Problem: Safety: Goal: Ability to remain free from injury will improve Outcome: Progressing   Problem: Skin Integrity: Goal: Risk for impaired  skin integrity will decrease Outcome: Progressing   Problem: Education: Goal: Ability to describe self-care measures that may prevent or decrease complications (Diabetes Survival Skills Education) will improve Outcome: Progressing Goal: Individualized Educational Video(s) Outcome: Progressing   Problem: Coping: Goal: Ability to adjust to condition or change in health will improve Outcome: Progressing   Problem: Fluid Volume: Goal: Ability to maintain a balanced intake and output will improve Outcome: Progressing   Problem: Health Behavior/Discharge Planning: Goal: Ability to identify and utilize available resources and services will improve Outcome: Progressing Goal: Ability to manage health-related needs will improve Outcome: Progressing   Problem: Metabolic: Goal: Ability to maintain appropriate glucose levels will improve Outcome: Progressing   Problem: Nutritional: Goal: Maintenance of adequate nutrition will improve Outcome: Progressing Goal: Progress toward achieving an optimal weight will improve Outcome: Progressing   Problem: Skin Integrity: Goal: Risk for impaired skin integrity will decrease Outcome: Progressing   Problem: Tissue Perfusion: Goal: Adequacy of tissue perfusion will improve Outcome: Progressing

## 2024-10-06 NOTE — Progress Notes (Signed)
 Progress Note   Patient: Kelly Krause FMW:[id number] DOB: 12/01/1940 DOA: 10/02/2024     4 DOS: the patient was seen and examined on 10/06/2024   Brief hospital course: Ms. Laketa Sandoz is a 83 year old female with history of atrial fibrillation on Eliquis , hyperlipidemia, depression, hypertension, GERD, who presents ED for chief concerns of shortness of breath. She has a severe hypoxemia with oxygen saturation at 85%, she was treated with BiPAP overnight. She was also found to have possible COVID, due to acute respite failure, patient was started on IV steroids. Patient was also found to have a BNP of 4519, elevated troponin 74, she was given 80 mg IV Lasix .  Patient was in ICU overnight 11/12, condition has improved, she is currently on 2 L oxygen, transferred to progressive unit 11/13.    Principal Problem:   Acute respiratory failure with hypoxia and hypercapnia (HCC) Active Problems:   Ventral hernia   Familial multiple lipoprotein-type hyperlipidemia   Clinical depression   Acid reflux   Essential (primary) hypertension   SCC (squamous cell carcinoma of esophagus) (HCC)   Atrial fibrillation (HCC)   Malignant neoplasm of esophagus (HCC)   Hyperlipidemia   Pneumonia due to COVID-19 virus   Acute on chronic diastolic CHF (congestive heart failure) (HCC)   Assessment and Plan: * Acute respiratory failure with hypoxemia and hypercapnia.  Secondary to viral pneumonia from COVID, acute on chronic congestive heart failure. COVID-pneumonia. Acute on chronic likely diastolic congestive heart failure Elevated troponin secondary to demand ischemia from acute respiratory failure. Patient had a severe hypoxemia with significant shortness of breath, was requiring BiPAP overnight.  She was given 80 mg IV Lasix , her condition appear to be improving.  She is currently on 2 L of oxygen over nasal cannula. Current respiratory failure was caused by combination of COVID-pneumonia and, acute  congestive heart failure. I personally reviewed patient chest x-ray, reported as multifocal, however, this could also seen with congestive heart failure.  BNP profoundly elevated with normal renal function, indicating severe volume overload.  She received 80 mg IV Lasix , continued 40 mg IV twice a day. She had echocardiogram performed in 2022, ejection fraction was 60 to 65%, repeat echocardiogram still showed ejection fraction 60 to 65% with moderate mitral valve regurgitation. Patient also receiving IV steroids for COVID-pneumonia.  She unlikely has any secondary bacterial pneumonia, procalcitonin level less than 0.1. I discontinued antibiotics. She is not a candidate for remdesivir, her symptoms started more than a week ago. Patient was transferred to stepdown unit from ICU on 11/13. Condition has improved, volume status much better.  Renal function still normal, I will discontinue diuretics, echocardiogram did not show any diastolic dysfunction.  Volume overload could be due to COVID. Patient doing well today, still desats with walking, but with good saturation at rest.  Will change steroids to oral.   Severe sore throat from COVID. Dysphagia secondary to sore throat. History of esophageal cancer. Constipation with nausea. Patient has severe sore throat from COVID, hard to swallow.  As a result, patient has not been able to eat.  Started lozenge.  She also had a history of esophageal cancer, but she did not have significant dysphagia before COVID.  I will hold off workup for esophageal stenosis. Patient symptoms better today, able to eat.  Had a some bowel movement after Fleet enema.  Will give another dose of lactulose .     Acute encephalopathy secondary to severe hypoxemia and hypercapnia. Patient had a significant confusion at the time  of admission.  This is most likely due to CO2 retention and hypoxemia.  Condition has improved since.   Metabolic acidosis. Hypokalemia. Condition has  improved.   Ventral hernia Lower abdominal ventral hernia is present Stable, no incarceration.   Hyperlipidemia Home atorvastatin  40 mg nightly resumed   Paroxysmal atrial fibrillation (HCC) Home Eliquis  5 mg p.o. twice daily, sotalol  80 mg p.o. twice daily, diltiazem 30 mg resumed on admission Patient currently in sinus rhythm.   Essential (primary) hypertension Continue home medicines.   Acid reflux Home PPI twice daily resumed   Major depression Venlafaxine  150 mg daily with breakfast and buspirone  7.5 mg p.o. twice daily were resumed.      Subjective:  Patient still complaining of sore throat with difficulty swallowing, but able to eat.  Had a small bowel movement after Fleet enema.  No nausea today.  Physical Exam: Vitals:   10/06/24 0022 10/06/24 0444 10/06/24 0500 10/06/24 0825  BP: (!) 157/67 (!) 141/53  (!) 151/62  Pulse: (!) 54 (!) 53  (!) 51  Resp: 20 20  18   Temp: 98.3 F (36.8 C) 98.9 F (37.2 C)  98.3 F (36.8 C)  TempSrc:      SpO2: 100% 100%  94%  Weight:   70.3 kg   Height:       General exam: Appears calm and comfortable  Respiratory system: Clear to auscultation. Respiratory effort normal. Cardiovascular system: S1 & S2 heard, RRR. No JVD, murmurs, rubs, gallops or clicks. No pedal edema. Gastrointestinal system: Abdomen is nondistended, soft and nontender. No organomegaly or masses felt. Normal bowel sounds heard. Central nervous system: Alert and oriented. No focal neurological deficits. Extremities: Symmetric 5 x 5 power. Skin: No rashes, lesions or ulcers Psychiatry: Judgement and insight appear normal. Mood & affect appropriate.    Data Reviewed:  Lab results reviewed.  Family Communication: None  Disposition: Status is: Inpatient Remains inpatient appropriate because: Severity of disease, most likely discharge home with home care tomorrow.     Time spent: 35 minutes  Author: Murvin Mana, MD 10/06/2024 11:35 AM  For on  call review [email or web address].

## 2024-10-07 ENCOUNTER — Other Ambulatory Visit: Payer: Self-pay

## 2024-10-07 LAB — CULTURE, BLOOD (ROUTINE X 2)
Culture: NO GROWTH
Culture: NO GROWTH
Special Requests: ADEQUATE

## 2024-10-07 LAB — GLUCOSE, CAPILLARY
Glucose-Capillary: 116 mg/dL — ABNORMAL HIGH (ref 70–99)
Glucose-Capillary: 104 mg/dL — ABNORMAL HIGH (ref 70–99)
Glucose-Capillary: 197 mg/dL — ABNORMAL HIGH (ref 70–99)

## 2024-10-07 MED ORDER — PANTOPRAZOLE SODIUM 40 MG PO TBEC
40.0000 mg | DELAYED_RELEASE_TABLET | Freq: Every day | ORAL | 0 refills | Status: AC
Start: 2024-10-07 — End: ?
  Filled 2024-10-07: qty 30, 30d supply, fill #0

## 2024-10-07 MED ORDER — HEPARIN SOD (PORK) LOCK FLUSH 100 UNIT/ML IV SOLN
500.0000 [IU] | INTRAVENOUS | Status: AC | PRN
  Administered 2024-10-07: 500 [IU]
  Filled 2024-10-07: qty 5

## 2024-10-07 MED ORDER — SENNOSIDES-DOCUSATE SODIUM 8.6-50 MG PO TABS
2.0000 | ORAL_TABLET | Freq: Two times a day (BID) | ORAL | 0 refills | Status: AC | PRN
  Filled 2024-10-07: qty 100, 25d supply, fill #0

## 2024-10-07 MED ORDER — LACTULOSE 10 GM/15ML PO SOLN
30.0000 g | Freq: Once | ORAL | Status: DC
  Filled 2024-10-07: qty 60

## 2024-10-07 MED ORDER — DEXAMETHASONE 2 MG PO TABS
6.0000 mg | ORAL_TABLET | Freq: Every day | ORAL | 0 refills | Status: AC
  Filled 2024-10-07: qty 12, 4d supply, fill #0

## 2024-10-07 NOTE — Plan of Care (Signed)
  Problem: Education: Goal: Knowledge of risk factors and measures for prevention of condition will improve Outcome: Progressing   Problem: Coping: Goal: Psychosocial and spiritual needs will be supported Outcome: Progressing   Problem: Respiratory: Goal: Will maintain a patent airway Outcome: Progressing Goal: Complications related to the disease process, condition or treatment will be avoided or minimized Outcome: Progressing   Problem: Education: Goal: Knowledge of General Education information will improve Description: Including pain rating scale, medication(s)/side effects and non-pharmacologic comfort measures Outcome: Progressing   Problem: Health Behavior/Discharge Planning: Goal: Ability to manage health-related needs will improve Outcome: Progressing   Problem: Clinical Measurements: Goal: Ability to maintain clinical measurements within normal limits will improve Outcome: Progressing Goal: Will remain free from infection Outcome: Progressing Goal: Diagnostic test results will improve Outcome: Progressing Goal: Respiratory complications will improve Outcome: Progressing Goal: Cardiovascular complication will be avoided Outcome: Progressing   Problem: Activity: Goal: Risk for activity intolerance will decrease Outcome: Progressing   Problem: Nutrition: Goal: Adequate nutrition will be maintained Outcome: Progressing   Problem: Coping: Goal: Level of anxiety will decrease Outcome: Progressing   Problem: Elimination: Goal: Will not experience complications related to bowel motility Outcome: Progressing Goal: Will not experience complications related to urinary retention Outcome: Progressing   Problem: Pain Managment: Goal: General experience of comfort will improve and/or be controlled Outcome: Progressing   Problem: Safety: Goal: Ability to remain free from injury will improve Outcome: Progressing   Problem: Skin Integrity: Goal: Risk for impaired  skin integrity will decrease Outcome: Progressing   Problem: Education: Goal: Ability to describe self-care measures that may prevent or decrease complications (Diabetes Survival Skills Education) will improve Outcome: Progressing Goal: Individualized Educational Video(s) Outcome: Progressing   Problem: Coping: Goal: Ability to adjust to condition or change in health will improve Outcome: Progressing   Problem: Fluid Volume: Goal: Ability to maintain a balanced intake and output will improve Outcome: Progressing   Problem: Health Behavior/Discharge Planning: Goal: Ability to identify and utilize available resources and services will improve Outcome: Progressing Goal: Ability to manage health-related needs will improve Outcome: Progressing   Problem: Metabolic: Goal: Ability to maintain appropriate glucose levels will improve Outcome: Progressing   Problem: Nutritional: Goal: Maintenance of adequate nutrition will improve Outcome: Progressing Goal: Progress toward achieving an optimal weight will improve Outcome: Progressing   Problem: Skin Integrity: Goal: Risk for impaired skin integrity will decrease Outcome: Progressing   Problem: Tissue Perfusion: Goal: Adequacy of tissue perfusion will improve Outcome: Progressing

## 2024-10-07 NOTE — Progress Notes (Signed)
 Nsg Discharge Note  Admit Date:  10/02/2024 Discharge date: 10/07/2024   Inocente [place] to be D/C'd Home per MD order.  AVS completed.  Copy for chart, and copy for patient signed, and dated. Patient/caregiver able to verbalize understanding.  Discharge Medication: Allergies as of 10/07/2024       Reactions   Ensure Itching, Swelling   Pt stated her throat itched after drinking chocolate ensure and vanilla.    Amoxicillin-pot Clavulanate Itching, Nausea Only, Other (See Comments)   Has patient had a PCN reaction causing immediate rash, facial/tongue/throat swelling, SOB or lightheadedness with hypotension: No Has patient had a PCN reaction causing severe rash involving mucus membranes or skin necrosis: No Has patient had a PCN reaction that required hospitalization: No Has patient had a PCN reaction occurring within the last 10 years: Yes If all of the above answers are NO, then may proceed with Cephalosporin use.   Codeine Nausea Only, Other (See Comments)   GI Upset headaches   Demerol [meperidine] Itching, Nausea Only, Other (See Comments)   GI Upset, Headaches   Doxycycline  Hives, Itching   Ferrous Fumarate Nausea And Vomiting, Nausea Only, Other (See Comments)   Latex Hives, Itching   Morphine  And Codeine Itching, Nausea Only, Other (See Comments)   Headaches, pt can still take    Oxycodone  Nausea And Vomiting, Other (See Comments)   Oxycodone -acetaminophen  Itching, Nausea Only   Penicillins Diarrhea, Itching, Other (See Comments), Nausea Only   Pollen Extract Hives, Itching, Swelling   Tramadol Other (See Comments)    GI Upset   Buprenorphine Hcl Itching, Nausea Only, Other (See Comments)   Headaches   Fentanyl  Itching, Nausea And Vomiting   Strawberry Extract Nausea And Vomiting, Rash   Tapentadol Rash        Medication List     STOP taking these medications    buPROPion  150 MG 12 hr tablet Commonly known as: WELLBUTRIN  SR   busPIRone  7.5 MG  tablet Commonly known as: BUSPAR    hydrochlorothiazide  12.5 MG capsule Commonly known as: MICROZIDE    ondansetron  4 MG tablet Commonly known as: ZOFRAN    oxyCODONE  5 MG immediate release tablet Commonly known as: Oxy IR/ROXICODONE    ramipril  10 MG capsule Commonly known as: ALTACE        TAKE these medications    acetaminophen  500 MG tablet Commonly known as: TYLENOL  Take 2 tablets (1,000 mg total) by mouth every 8 (eight) hours.   apixaban  5 MG Tabs tablet Commonly known as: ELIQUIS  Take 1 tablet (5 mg total) by mouth 2 (two) times daily.   atorvastatin  40 MG tablet Commonly known as: Lipitor  Take 1 tablet (40 mg total) by mouth daily.   BIOTIN PO Take 1 tablet by mouth daily.   dexamethasone  2 MG tablet Commonly known as: DECADRON  Take 3 tablets (6 mg total) by mouth daily for 4 days.   diphenhydrAMINE  25 mg capsule Commonly known as: BENADRYL  Take 50-75 mg every 6 (six) hours as needed by mouth for itching.   docusate sodium  100 MG capsule Commonly known as: COLACE Take 1 capsule (100 mg total) by mouth 2 (two) times daily.   fexofenadine 180 MG tablet Commonly known as: ALLEGRA Take 180 mg by mouth at bedtime.   fluticasone  50 MCG/ACT nasal spray Commonly known as: FLONASE  USE ONE SPRAY(S) IN EACH NOSTRIL ONCE DAILY What changed:  how much to take how to take this when to take this additional instructions   furosemide  20 MG tablet Commonly known as: LASIX   Take 20 mg by mouth every morning.   OCUVITE ADULT 50+ PO Take 1 tablet by mouth daily.   pantoprazole  40 MG tablet Commonly known as: PROTONIX  Take 1 tablet (40 mg total) by mouth daily. What changed: when to take this   sotalol  80 MG tablet Commonly known as: BETAPACE  Take 80 mg by mouth 2 (two) times daily.   Stool Softener/Laxative 50-8.6 MG tablet Generic drug: senna-docusate Take 2 tablets by mouth 2 (two) times daily as needed for mild constipation.   venlafaxine  XR 150 MG 24  hr capsule Commonly known as: EFFEXOR -XR Take 150 mg by mouth daily with breakfast.        Discharge Assessment: Vitals:   10/07/24 1048 10/07/24 1311  BP:  (!) 160/68  Pulse: (!) 55 (!) 51  Resp:  18  Temp:  98.2 F (36.8 C)  SpO2: 100% 100%   Skin clean, dry and intact without evidence of skin break down, no evidence of skin tears noted. IV catheter discontinued intact. Site without signs and symptoms of complications - no redness or edema noted at insertion site, patient denies c/o pain - only slight tenderness at site.  Dressing with slight pressure applied.  D/c Instructions-Education: Discharge instructions given to patient/family with verbalized understanding. D/c education completed with patient/family including follow up instructions, medication list, d/c activities limitations if indicated, with other d/c instructions as indicated by MD - patient able to verbalize understanding, all questions fully answered. Patient instructed to return to ED, call 911, or call MD for any changes in condition.  Patient escorted via WC, and D/C home via private auto.  Almarie DELENA Garret, RN 10/07/2024 2:08 PM

## 2024-10-07 NOTE — Discharge Instructions (Signed)
 Follow up with PCP and your meds were delivered to you. Make sure to take all antibiotics if ordered. Do not double up on narcotic/pain medication or drink alcohol if you are on narcotic medications. Do not drive while taking narcotic medication. Call 911 or return to ER for life threatening issues or other concerns.

## 2024-10-07 NOTE — Discharge Summary (Signed)
 Physician Discharge Summary   Patient: Kelly Krause MRN: [id number] DOB: 05-22-1941  Admit date:     10/02/2024  Discharge date: 10/07/24  Discharge Physician: Murvin Mana   PCP: Bertrum Charlie CROME, MD   Recommendations at discharge:   Follow-up with PCP in 1 week.  Discharge Diagnoses: Principal Problem:   Acute respiratory failure with hypoxia and hypercapnia (HCC) Active Problems:   Ventral hernia   Familial multiple lipoprotein-type hyperlipidemia   Clinical depression   Acid reflux   Essential (primary) hypertension   SCC (squamous cell carcinoma of esophagus) (HCC)   Atrial fibrillation (HCC)   Malignant neoplasm of esophagus (HCC)   Hyperlipidemia   Pneumonia due to COVID-19 virus   Acute on chronic diastolic CHF (congestive heart failure) (HCC)  Resolved Problems:   H/O: depression   BP (high blood pressure)  Hospital Course: Kelly Krause is a 83 year old female with history of atrial fibrillation on Eliquis , hyperlipidemia, depression, hypertension, GERD, who presents ED for chief concerns of shortness of breath. She has a severe hypoxemia with oxygen saturation at 85%, she was treated with BiPAP overnight. She was also found to have possible COVID, due to acute respite failure, patient was started on IV steroids. Patient was also found to have a BNP of 4519, elevated troponin 74, she was given 80 mg IV Lasix .  Patient was in ICU overnight 11/12, condition has improved, she is currently on 2 L oxygen, transferred to progressive unit 11/13.  Condition had resolved, oxygenation improved to 100% on room air.  Medically stable for discharge.   Assessment and Plan: * Acute respiratory failure with hypoxemia and hypercapnia.  Secondary to viral pneumonia from COVID, acute on chronic congestive heart failure. COVID-pneumonia. Acute on chronic likely diastolic congestive heart failure Elevated troponin secondary to demand ischemia from acute respiratory  failure. Patient had a severe hypoxemia with significant shortness of breath, was requiring BiPAP overnight.  She was given 80 mg IV Lasix , her condition appear to be improving.  She is currently on 2 L of oxygen over nasal cannula. Current respiratory failure was caused by combination of COVID-pneumonia and, acute congestive heart failure. I personally reviewed patient chest x-ray, reported as multifocal, however, this could also seen with congestive heart failure.  BNP profoundly elevated with normal renal function, indicating severe volume overload.  She received 80 mg IV Lasix , continued 40 mg IV twice a day. She had echocardiogram performed in 2022, ejection fraction was 60 to 65%, repeat echocardiogram still showed ejection fraction 60 to 65% with moderate mitral valve regurgitation. Patient also receiving IV steroids for COVID-pneumonia.  She unlikely has any secondary bacterial pneumonia, procalcitonin level less than 0.1. I discontinued antibiotics. She is not a candidate for remdesivir, her symptoms started more than a week ago. Patient was transferred to stepdown unit from ICU on 11/13. Condition has improved, volume status much better.  Renal function still normal, I will discontinue diuretics, echocardiogram did not show any diastolic dysfunction.  Volume overload could be due to COVID. Patient doing well today, still desats with walking, but with good saturation at rest.  Will change steroids to oral, finish 10 days in total course. Patient condition has improved, medically stable for discharge: patient does not need oxygen per home oxygen evaluation.   Severe sore throat from COVID. Dysphagia secondary to sore throat. History of esophageal cancer. Constipation with nausea. Patient has severe sore throat from COVID, hard to swallow.  As a result, patient has not been able to eat.  Started lozenge.  She also had a history of esophageal cancer, but she did not have significant dysphagia  before COVID.  I will hold off workup for esophageal stenosis. Patient symptoms better today, able to eat.  Had a some bowel movement after Fleet enema.  Condition improved, patient is able to eat.  Will give another dose of lactulose  before discharge.     Acute encephalopathy secondary to severe hypoxemia and hypercapnia. Patient had a significant confusion at the time of admission.  This is most likely due to CO2 retention and hypoxemia.  Condition has improved since.   Metabolic acidosis. Hypokalemia. Condition has improved.   Ventral hernia Lower abdominal ventral hernia is present Stable, no incarceration.   Hyperlipidemia Home atorvastatin  40 mg nightly resumed   Paroxysmal atrial fibrillation (HCC) Resume home treatment.   Essential (primary) hypertension Continue home medicines.   Acid reflux Home PPI twice daily resumed   Major depression Resume home treatment.       Consultants: None Procedures performed: None  Disposition: Home health Diet recommendation:  Discharge Diet Orders (From admission, onward)     Start     Ordered   10/07/24 0000  Diet - low sodium heart healthy        10/07/24 1144           Cardiac diet DISCHARGE MEDICATION: Allergies as of 10/07/2024       Reactions   Amoxicillin-pot Clavulanate Itching, Nausea Only, Other (See Comments)   Has patient had a PCN reaction causing immediate rash, facial/tongue/throat swelling, SOB or lightheadedness with hypotension: No Has patient had a PCN reaction causing severe rash involving mucus membranes or skin necrosis: No Has patient had a PCN reaction that required hospitalization: No Has patient had a PCN reaction occurring within the last 10 years: Yes If all of the above answers are NO, then may proceed with Cephalosporin use.   Codeine Nausea Only, Other (See Comments)   GI Upset headaches   Demerol [meperidine] Itching, Nausea Only, Other (See Comments)   GI Upset, Headaches    Doxycycline  Hives, Itching   Ferrous Fumarate Nausea And Vomiting, Nausea Only, Other (See Comments)   Latex Hives, Itching   Morphine  And Codeine Itching, Nausea Only, Other (See Comments)   Headaches, pt can still take    Oxycodone  Nausea And Vomiting, Other (See Comments)   Oxycodone -acetaminophen  Itching, Nausea Only   Penicillins Diarrhea, Itching, Other (See Comments), Nausea Only   Pollen Extract Hives, Itching, Swelling   Tramadol Other (See Comments)    GI Upset   Buprenorphine Hcl Itching, Nausea Only, Other (See Comments)   Headaches   Fentanyl  Itching, Nausea And Vomiting   Strawberry Extract Nausea And Vomiting, Rash   Tapentadol Rash        Medication List     STOP taking these medications    buPROPion  150 MG 12 hr tablet Commonly known as: WELLBUTRIN  SR   busPIRone  7.5 MG tablet Commonly known as: BUSPAR    hydrochlorothiazide  12.5 MG capsule Commonly known as: MICROZIDE    ondansetron  4 MG tablet Commonly known as: ZOFRAN    oxyCODONE  5 MG immediate release tablet Commonly known as: Oxy IR/ROXICODONE    ramipril  10 MG capsule Commonly known as: ALTACE        TAKE these medications    acetaminophen  500 MG tablet Commonly known as: TYLENOL  Take 2 tablets (1,000 mg total) by mouth every 8 (eight) hours.   apixaban  5 MG Tabs tablet Commonly known as: ELIQUIS  Take 1 tablet (  5 mg total) by mouth 2 (two) times daily.   atorvastatin  40 MG tablet Commonly known as: Lipitor  Take 1 tablet (40 mg total) by mouth daily.   BIOTIN PO Take 1 tablet by mouth daily.   dexamethasone  6 MG tablet Commonly known as: DECADRON  Take 1 tablet (6 mg total) by mouth daily for 4 days.   diphenhydrAMINE  25 mg capsule Commonly known as: BENADRYL  Take 50-75 mg every 6 (six) hours as needed by mouth for itching.   docusate sodium  100 MG capsule Commonly known as: COLACE Take 1 capsule (100 mg total) by mouth 2 (two) times daily.   fexofenadine 180 MG  tablet Commonly known as: ALLEGRA Take 180 mg by mouth at bedtime.   fluticasone  50 MCG/ACT nasal spray Commonly known as: FLONASE  USE ONE SPRAY(S) IN EACH NOSTRIL ONCE DAILY What changed:  how much to take how to take this when to take this additional instructions   furosemide  20 MG tablet Commonly known as: LASIX  Take 20 mg by mouth every morning.   OCUVITE ADULT 50+ PO Take 1 tablet by mouth daily.   pantoprazole  40 MG tablet Commonly known as: PROTONIX  Take 1 tablet (40 mg total) by mouth daily. What changed: when to take this   senna-docusate 8.6-50 MG tablet Commonly known as: Senokot-S Take 2 tablets by mouth 2 (two) times daily as needed for mild constipation.   sotalol  80 MG tablet Commonly known as: BETAPACE  Take 80 mg by mouth 2 (two) times daily.   venlafaxine  XR 150 MG 24 hr capsule Commonly known as: EFFEXOR -XR Take 150 mg by mouth daily with breakfast.        Follow-up Information     Bertrum Charlie CROME, MD Follow up in 1 week(s).   Specialty: Family Medicine Contact information: [address] [place] [address] [phone]                Discharge Exam: Fredricka Weights   10/05/24 0500 10/06/24 0500 10/07/24 0500  Weight: 72.5 kg 70.3 kg 70.9 kg   General exam: Appears calm and comfortable  Respiratory system: Clear to auscultation. Respiratory effort normal. Cardiovascular system: S1 & S2 heard, RRR. No JVD, murmurs, rubs, gallops or clicks. No pedal edema. Gastrointestinal system: Abdomen is nondistended, soft and nontender. No organomegaly or masses felt. Normal bowel sounds heard. Central nervous system: Alert and oriented. No focal neurological deficits. Extremities: Symmetric 5 x 5 power. Skin: No rashes, lesions or ulcers Psychiatry: Judgement and insight appear normal. Mood & affect appropriate.    Condition at discharge: good  The results of significant diagnostics from this hospitalization (including imaging,  microbiology, ancillary and laboratory) are listed below for reference.   Imaging Studies: ECHOCARDIOGRAM COMPLETE Result Date: 10/03/2024    ECHOCARDIOGRAM REPORT   Patient Name:   TELLY BROBERG Date of Exam: 10/03/2024 Medical Rec #:  [id number]       Height:       67.0 in Accession #:    [id number]      Weight:       165.1 lb Date of Birth:  10-14-1941        BSA:          1.864 m Patient Age:    83 years        BP:           117/58 mmHg Patient Gender: F               HR:  68 bpm. Exam Location:  [facility] Procedure: 2D Echo, Cardiac Doppler and Color Doppler (Both Spectral and Color            Flow Doppler were utilized during procedure). Indications:     Acute respiratory distress R06.03  History:         Patient has prior history of Echocardiogram examinations, most                  recent 06/04/2021. Stroke; Risk Factors:Diabetes and                  Hypertension.  Sonographer:     Christopher Furnace Referring Phys:  [id number] MAGDALENE S TUKOV-YUAL Diagnosing Phys: Marsa Dooms MD IMPRESSIONS  1. Left ventricular ejection fraction, by estimation, is 60 to 65%. The left ventricle has normal function. The left ventricle has no regional wall motion abnormalities. Left ventricular diastolic parameters were normal.  2. Right ventricular systolic function is normal. The right ventricular size is normal.  3. The mitral valve is normal in structure. Moderate mitral valve regurgitation. No evidence of mitral stenosis.  4. The aortic valve is normal in structure. Aortic valve regurgitation is not visualized. Aortic valve sclerosis is present, with no evidence of aortic valve stenosis.  5. The inferior vena cava is normal in size with greater than 50% respiratory variability, suggesting right atrial pressure of 3 mmHg. FINDINGS  Left Ventricle: Left ventricular ejection fraction, by estimation, is 60 to 65%. The left ventricle has normal function. The left ventricle has no regional wall motion abnormalities.  Strain was performed and the global longitudinal strain is indeterminate. The left ventricular internal cavity size was normal in size. There is no left ventricular hypertrophy. Left ventricular diastolic parameters were normal. Right Ventricle: The right ventricular size is normal. No increase in right ventricular wall thickness. Right ventricular systolic function is normal. Left Atrium: Left atrial size was normal in size. Right Atrium: Right atrial size was normal in size. Pericardium: There is no evidence of pericardial effusion. Mitral Valve: The mitral valve is normal in structure. Moderate mitral valve regurgitation. No evidence of mitral valve stenosis. MV peak gradient, 3.9 mmHg. The mean mitral valve gradient is 1.0 mmHg. Tricuspid Valve: The tricuspid valve is normal in structure. Tricuspid valve regurgitation is mild . No evidence of tricuspid stenosis. Aortic Valve: The aortic valve is normal in structure. Aortic valve regurgitation is not visualized. Aortic valve sclerosis is present, with no evidence of aortic valve stenosis. Aortic valve mean gradient measures 4.0 mmHg. Aortic valve peak gradient measures 7.4 mmHg. Aortic valve area, by VTI measures 1.61 cm. Pulmonic Valve: The pulmonic valve was normal in structure. Pulmonic valve regurgitation is not visualized. No evidence of pulmonic stenosis. Aorta: The aortic root is normal in size and structure. Venous: The inferior vena cava is normal in size with greater than 50% respiratory variability, suggesting right atrial pressure of 3 mmHg. IAS/Shunts: No atrial level shunt detected by color flow Doppler. Additional Comments: 3D was performed not requiring image post processing on an independent workstation and was indeterminate.  LEFT VENTRICLE PLAX 2D LVIDd:         4.70 cm   Diastology LVIDs:         3.60 cm   LV e' medial:    5.11 cm/s LV PW:         1.00 cm   LV E/e' medial:  14.1 LV IVS:        1.15 cm  LV e' lateral:   4.57 cm/s LVOT diam:      2.00 cm   LV E/e' lateral: 15.8 LV SV:         53 LV SV Index:   29 LVOT Area:     3.14 cm LV IVRT:       82 msec  RIGHT VENTRICLE RV Basal diam:  3.00 cm     PULMONARY VEINS RV Mid diam:    2.90 cm     Diastolic Velocity: 20.70 cm/s RV S prime:     10.60 cm/s  S/D Velocity:       1.20 TAPSE (M-mode): 2.1 cm      Systolic Velocity:  24.60 cm/s LEFT ATRIUM             Index        RIGHT ATRIUM           Index LA diam:        4.40 cm 2.36 cm/m   RA Area:     12.50 cm LA Vol (A2C):   46.8 ml 25.11 ml/m  RA Volume:   26.30 ml  14.11 ml/m LA Vol (A4C):   44.5 ml 23.87 ml/m LA Biplane Vol: 50.7 ml 27.20 ml/m  AORTIC VALVE AV Area (Vmax):    1.67 cm AV Area (Vmean):   1.58 cm AV Area (VTI):     1.61 cm AV Vmax:           135.67 cm/s AV Vmean:          94.600 cm/s AV VTI:            0.331 m AV Peak Grad:      7.4 mmHg AV Mean Grad:      4.0 mmHg LVOT Vmax:         72.30 cm/s LVOT Vmean:        47.700 cm/s LVOT VTI:          0.170 m LVOT/AV VTI ratio: 0.51  AORTA Ao Root diam: 2.80 cm MITRAL VALVE               TRICUSPID VALVE MV Area (PHT): 3.54 cm    TR Peak grad:   25.4 mmHg MV Area VTI:   2.06 cm    TR Vmax:        252.00 cm/s MV Peak grad:  3.9 mmHg MV Mean grad:  1.0 mmHg    SHUNTS MV Vmax:       0.98 m/s    Systemic VTI:  0.17 m MV Vmean:      54.0 cm/s   Systemic Diam: 2.00 cm MV Decel Time: 214 msec MV E velocity: 72.30 cm/s MV A velocity: 50.30 cm/s MV E/A ratio:  1.44 Marsa Dooms MD Electronically signed by Marsa Dooms MD Signature Date/Time: 10/03/2024/4:37:12 PM    Final    DG Chest Port 1 View Result Date: 10/02/2024 CLINICAL DATA:  Shortness of breath. EXAM: PORTABLE CHEST 1 VIEW COMPARISON:  None 2024 FINDINGS: New patchy airspace disease identified in the right mid and lower lung and left lung base. Stable blunting of the costophrenic angle suggests chronic small bilateral pleural effusions. Cardiopericardial silhouette is at upper limits of normal for size. Right  Port-A-Cath remains in place. Telemetry leads overlie the chest. IMPRESSION: New patchy airspace disease in the right mid and lower lung and left lung base. Imaging features suggest multifocal pneumonia. Electronically Signed   By: Camellia Candle M.D.   On: 10/02/2024  12:04    Microbiology: Results for orders placed or performed during the hospital encounter of 10/02/24  Resp panel by RT-PCR (RSV, Flu A&B, Covid) Anterior Nasal Swab     Status: Abnormal   Collection Time: 10/02/24 11:32 AM   Specimen: Anterior Nasal Swab  Result Value Ref Range Status   SARS Coronavirus 2 by RT PCR POSITIVE (A) NEGATIVE Final    Comment: (NOTE) SARS-CoV-2 target nucleic acids are DETECTED.  The SARS-CoV-2 RNA is generally detectable in upper respiratory specimens during the acute phase of infection. Positive results are indicative of the presence of the identified virus, but do not rule out bacterial infection or co-infection with other pathogens not detected by the test. Clinical correlation with patient history and other diagnostic information is necessary to determine patient infection status. The expected result is Negative.  Fact Sheet for Patients: bloggercourse.com  Fact Sheet for Healthcare Providers: seriousbroker.it  This test is not yet approved or cleared by the [place]  FDA and  has been authorized for detection and/or diagnosis of SARS-CoV-2 by FDA under an Emergency Use Authorization (EUA).  This EUA will remain in effect (meaning this test can be used) for the duration of  the COVID-19 declaration under Section 564(b)(1) of the A ct, 21 U.S.C. section 360bbb-3(b)(1), unless the authorization is terminated or revoked sooner.     Influenza A by PCR NEGATIVE NEGATIVE Final   Influenza B by PCR NEGATIVE NEGATIVE Final    Comment: (NOTE) The Xpert Xpress SARS-CoV-2/FLU/RSV plus assay is intended as an aid in the diagnosis of  influenza from Nasopharyngeal swab specimens and should not be used as a sole basis for treatment. Nasal washings and aspirates are unacceptable for Xpert Xpress SARS-CoV-2/FLU/RSV testing.  Fact Sheet for Patients: bloggercourse.com  Fact Sheet for Healthcare Providers: seriousbroker.it  This test is not yet approved or cleared by the [place]  FDA and has been authorized for detection and/or diagnosis of SARS-CoV-2 by FDA under an Emergency Use Authorization (EUA). This EUA will remain in effect (meaning this test can be used) for the duration of the COVID-19 declaration under Section 564(b)(1) of the Act, 21 U.S.C. section 360bbb-3(b)(1), unless the authorization is terminated or revoked.     Resp Syncytial Virus by PCR NEGATIVE NEGATIVE Final    Comment: (NOTE) Fact Sheet for Patients: bloggercourse.com  Fact Sheet for Healthcare Providers: seriousbroker.it  This test is not yet approved or cleared by the [place]  FDA and has been authorized for detection and/or diagnosis of SARS-CoV-2 by FDA under an Emergency Use Authorization (EUA). This EUA will remain in effect (meaning this test can be used) for the duration of the COVID-19 declaration under Section 564(b)(1) of the Act, 21 U.S.C. section 360bbb-3(b)(1), unless the authorization is terminated or revoked.  Performed at [facility], [address] [place], [place] [address]   Culture, blood (routine x 2)     Status: None   Collection Time: 10/02/24 11:32 AM   Specimen: BLOOD  Result Value Ref Range Status   Specimen Description BLOOD RIGHT ANTECUBITAL  Final   Special Requests   Final    BOTTLES DRAWN AEROBIC AND ANAEROBIC Blood Culture results may not be optimal due to an inadequate volume of blood received in culture bottles   Culture   Final    NO GROWTH 5 DAYS Performed at [facility], [address], [place], [place] [address]    Report Status 10/07/2024 FINAL  Final  Culture, blood (routine x  2)     Status: None   Collection Time: 10/02/24 12:32 PM   Specimen: BLOOD RIGHT ARM  Result Value Ref Range Status   Specimen Description BLOOD RIGHT ARM  Final   Special Requests   Final    BOTTLES DRAWN AEROBIC AND ANAEROBIC Blood Culture adequate volume   Culture   Final    NO GROWTH 5 DAYS Performed at [facility], [address], [place], [place] [address]    Report Status 10/07/2024 FINAL  Final  MRSA Next Gen by PCR, Nasal     Status: Abnormal   Collection Time: 10/02/24  1:52 PM   Specimen: Nasal Mucosa; Nasal Swab  Result Value Ref Range Status   MRSA by PCR Next Gen DETECTED (A) NOT DETECTED Final    Comment: RESULT CALLED TO, READ BACK BY AND VERIFIED WITH: BETH CRABTREE 10/02/24 1947 MU (NOTE) The GeneXpert MRSA Assay (FDA approved for NASAL specimens only), is one component of a comprehensive MRSA colonization surveillance program. It is not intended to diagnose MRSA infection nor to guide or monitor treatment for MRSA infections. Test performance is not FDA approved in patients less than 25 years old. Performed at [facility], [address] [place], [place] [address]     Labs: CBC: Recent Labs  Lab 10/02/24 1132 10/02/24 2142 10/03/24 0418 10/04/24 1146  WBC 7.7 5.7 8.7 10.7*  NEUTROABS 5.6 5.2  --   --   HGB 12.3 12.5 11.4* 11.3*  HCT 38.6 40.5 36.3 34.6*  MCV 98.7 100.5* 98.6 96.1  PLT 306 348 295 280   Basic Metabolic Panel: Recent Labs  Lab 10/02/24 2142 10/03/24 0418 10/04/24 1146 10/05/24 0532 10/06/24 0456  NA 138 142 139 141 143  K 4.0 4.1 3.6 3.2* 4.0  CL 101 103 101 99 102  CO2 21* 27 29 32 33*  GLUCOSE 377* 125* 133* 101* 106*  BUN 17 21 31* 29* 33*  CREATININE 1.01* 0.82 0.78 0.74 0.80  CALCIUM  8.3* 8.5* 8.9 8.8* 8.9  MG 2.0  --  2.1 1.9  --   PHOS 6.2*  --   --   --   --    Liver  Function Tests: Recent Labs  Lab 10/02/24 1132 10/02/24 2142  AST 28 72*  ALT 23 55*  ALKPHOS 114 158*  BILITOT 0.5 0.4  PROT 7.1 8.0  ALBUMIN 3.9 3.9   CBG: Recent Labs  Lab 10/06/24 1629 10/06/24 2058 10/06/24 2354 10/07/24 0428 10/07/24 0834  GLUCAP 153* 205* 134* 116* 104*    Discharge time spent: 35 minutes.  Signed: Murvin Mana, MD [facility] 10/07/2024

## 2024-10-07 NOTE — Progress Notes (Signed)
 Patient refusing lactulose  again today. MD notified. Pt stated she had a small BM yesterday and this morning. Pt educated that she will be discharged today. Pt states her ride will not be available until 2pm. Pt is positive for covid so is unable to go to d/c lounge. Pt provided with POC and verbalizes understanding. NAD. VSS. Bed locked and low. Call bell in reach.

## 2024-10-07 NOTE — Progress Notes (Signed)
SATURATION QUALIFICATIONS: (This note is used to comply with regulatory documentation for home oxygen)  Patient Saturations on Room Air at Rest = 100%  Patient Saturations on Room Air while Ambulating = 100%  Patient Saturations on 0 Liters of oxygen while Ambulating = 100%  Please briefly explain why patient needs home oxygen: 

## 2024-10-09 DIAGNOSIS — J1282 Pneumonia due to coronavirus disease 2019: Secondary | ICD-10-CM | POA: Diagnosis not present

## 2024-10-09 DIAGNOSIS — Z09 Encounter for follow-up examination after completed treatment for conditions other than malignant neoplasm: Secondary | ICD-10-CM | POA: Diagnosis not present

## 2024-10-09 DIAGNOSIS — J9601 Acute respiratory failure with hypoxia: Secondary | ICD-10-CM | POA: Diagnosis not present

## 2024-10-09 DIAGNOSIS — I5023 Acute on chronic systolic (congestive) heart failure: Secondary | ICD-10-CM | POA: Diagnosis not present

## 2024-10-09 DIAGNOSIS — J9602 Acute respiratory failure with hypercapnia: Secondary | ICD-10-CM | POA: Diagnosis not present

## 2024-10-09 DIAGNOSIS — U071 COVID-19: Secondary | ICD-10-CM | POA: Diagnosis not present

## 2024-12-18 ENCOUNTER — Other Ambulatory Visit: Payer: Self-pay | Admitting: Family Medicine

## 2024-12-18 DIAGNOSIS — Z1231 Encounter for screening mammogram for malignant neoplasm of breast: Secondary | ICD-10-CM

## 2024-12-19 ENCOUNTER — Ambulatory Visit
Admission: RE | Admit: 2024-12-19 | Discharge: 2024-12-19 | Disposition: A | Discharge status: Home / Self Care | Source: Ambulatory Visit | Attending: Family Medicine | Admitting: Family Medicine

## 2024-12-19 DIAGNOSIS — Z1231 Encounter for screening mammogram for malignant neoplasm of breast: Secondary | ICD-10-CM | POA: Diagnosis present

## 2024-12-25 ENCOUNTER — Other Ambulatory Visit: Payer: Self-pay

## 2024-12-25 ENCOUNTER — Inpatient Hospital Stay: Admission: EM | Admit: 2024-12-25 | Source: Home / Self Care | Attending: Family Medicine | Admitting: Family Medicine

## 2024-12-25 ENCOUNTER — Emergency Department

## 2024-12-25 DIAGNOSIS — I480 Paroxysmal atrial fibrillation: Secondary | ICD-10-CM | POA: Diagnosis not present

## 2024-12-25 DIAGNOSIS — J9602 Acute respiratory failure with hypercapnia: Secondary | ICD-10-CM | POA: Diagnosis not present

## 2024-12-25 DIAGNOSIS — F32A Depression, unspecified: Secondary | ICD-10-CM | POA: Insufficient documentation

## 2024-12-25 DIAGNOSIS — E785 Hyperlipidemia, unspecified: Secondary | ICD-10-CM

## 2024-12-25 DIAGNOSIS — J441 Chronic obstructive pulmonary disease with (acute) exacerbation: Principal | ICD-10-CM

## 2024-12-25 DIAGNOSIS — J9601 Acute respiratory failure with hypoxia: Secondary | ICD-10-CM | POA: Diagnosis present

## 2024-12-25 DIAGNOSIS — I160 Hypertensive urgency: Secondary | ICD-10-CM | POA: Diagnosis not present

## 2024-12-25 LAB — CBC WITH DIFFERENTIAL/PLATELET
Abs Immature Granulocytes: 0.03 10*3/uL (ref 0.00–0.07)
Basophils Absolute: 0.1 10*3/uL (ref 0.0–0.1)
Basophils Relative: 1 %
Eosinophils Absolute: 0.2 10*3/uL (ref 0.0–0.5)
Eosinophils Relative: 2 %
HCT: 41 % (ref 36.0–46.0)
Hemoglobin: 13.1 g/dL (ref 12.0–15.0)
Immature Granulocytes: 0 %
Lymphocytes Relative: 21 %
Lymphs Abs: 2.2 10*3/uL (ref 0.7–4.0)
MCH: 30.8 pg (ref 26.0–34.0)
MCHC: 32 g/dL (ref 30.0–36.0)
MCV: 96.5 fL (ref 80.0–100.0)
Monocytes Absolute: 0.7 10*3/uL (ref 0.1–1.0)
Monocytes Relative: 7 %
Neutro Abs: 7.2 10*3/uL (ref 1.7–7.7)
Neutrophils Relative %: 69 %
Platelets: 307 10*3/uL (ref 150–400)
RBC: 4.25 MIL/uL (ref 3.87–5.11)
RDW: 14.1 % (ref 11.5–15.5)
WBC: 10.3 10*3/uL (ref 4.0–10.5)
nRBC: 0 % (ref 0.0–0.2)

## 2024-12-25 LAB — BLOOD GAS, VENOUS
Acid-base deficit: 0.7 mmol/L (ref 0.0–2.0)
Bicarbonate: 28.3 mmol/L — ABNORMAL HIGH (ref 20.0–28.0)
O2 Saturation: 56.2 %
Patient temperature: 37
pCO2, Ven: 66 mmHg — ABNORMAL HIGH (ref 44–60)
pH, Ven: 7.24 — ABNORMAL LOW (ref 7.25–7.43)
pO2, Ven: 36 mmHg (ref 32–45)

## 2024-12-25 LAB — BASIC METABOLIC PANEL WITH GFR
Anion gap: 13 (ref 5–15)
BUN: 17 mg/dL (ref 8–23)
CO2: 27 mmol/L (ref 22–32)
Calcium: 8.8 mg/dL — ABNORMAL LOW (ref 8.9–10.3)
Chloride: 100 mmol/L (ref 98–111)
Creatinine, Ser: 0.66 mg/dL (ref 0.44–1.00)
GFR, Estimated: >60 mL/min
Glucose, Bld: 230 mg/dL — ABNORMAL HIGH (ref 70–99)
Potassium: 3.7 mmol/L (ref 3.5–5.1)
Sodium: 139 mmol/L (ref 135–145)

## 2024-12-25 LAB — TROPONIN T, HIGH SENSITIVITY
Troponin T High Sensitivity: 22 ng/L — ABNORMAL HIGH (ref 0–19)
Troponin T High Sensitivity: 27 ng/L — ABNORMAL HIGH (ref 0–19)

## 2024-12-25 LAB — RESP PANEL BY RT-PCR (RSV, FLU A&B, COVID)  RVPGX2
Influenza A by PCR: NEGATIVE
Influenza B by PCR: NEGATIVE
Resp Syncytial Virus by PCR: NEGATIVE
SARS Coronavirus 2 by RT PCR: NEGATIVE

## 2024-12-25 MED ORDER — ACETAMINOPHEN 325 MG PO TABS
650.0000 mg | ORAL_TABLET | Freq: Once | ORAL | Status: AC
  Administered 2024-12-25: 650 mg via ORAL
  Filled 2024-12-25: qty 2

## 2024-12-25 MED ORDER — ALBUTEROL SULFATE (2.5 MG/3ML) 0.083% IN NEBU
5.0000 mg | INHALATION_SOLUTION | Freq: Once | RESPIRATORY_TRACT | Status: AC
  Administered 2024-12-25: 5 mg via RESPIRATORY_TRACT
  Filled 2024-12-25: qty 6

## 2024-12-25 MED ORDER — LABETALOL HCL 5 MG/ML IV SOLN
20.0000 mg | INTRAVENOUS | Status: AC | PRN
  Administered 2024-12-25: 20 mg via INTRAVENOUS
  Filled 2024-12-25: qty 4

## 2024-12-25 NOTE — ED Notes (Signed)
 CCMD called to place pt on tele

## 2024-12-25 NOTE — ED Provider Notes (Addendum)
 "  [facility] Provider Note    Event Date/Time   First MD Initiated Contact with Patient 12/25/24 2050     (approximate)   History   Chief Complaint: Respiratory Distress   HPI  Kelly Krause is a 84 y.o. female with a history of atrial fibrillation, diabetes, emphysema who comes ED due to shortness of breath and wheezing, started earlier today.  EMS noted room air oxygen saturation of 82%, obvious wheezing.  They gave Solu-Medrol  magnesium  and 3 DuoNebs prior to arrival.  Patient still complains of severe shortness of breath, denies chest pain or fever.        Past Medical History:  Diagnosis Date   Allergy    Anticoagulated on apixaban     Atrial fibrillation (HCC)    Colon cancer (HCC)    chemo/rad 15 yrs   DDD (degenerative disc disease), cervical    DM (diabetes mellitus), type 2 (HCC)    controlled no medications   Elevated lipids    Esophageal cancer (HCC)    GERD (gastroesophageal reflux disease)    History of blood transfusion    Hyperlipidemia    Hypertension    Lung cancer (HCC)    chemo   Major depressive disorder, recurrent episode, moderate (HCC)    Osteoarthritis of left hip    Persistent disorder of initiating or maintaining sleep    Personal history of chemotherapy    Personal history of radiation therapy    colon   Stroke (HCC) 06/03/2021   No Deficits   SVT (supraventricular tachycardia)     Current Outpatient Rx   Order #: [id number] Class: OTC   Order #: [id number] Class: Normal   Order #: [id number] Class: Normal   Order #: [id number] Class: Historical Med   Order #: [id number] Class: Historical Med   Order #: [id number] Class: Print   Order #: [id number] Class: Historical Med   Order #: [id number] Class: Normal   Order #: [id number] Class: Historical Med   Order #: [id number] Class: Historical Med   Order #: [id number] Class: Normal   Order #: [id number] Class: Normal   Order #: [id number] Class: Historical Med   Order #:  [id number] Class: Historical Med    Past Surgical History:  Procedure Laterality Date   ABDOMINAL HYSTERECTOMY     ATRIAL FIBRILLATION ABLATION     BROW LIFT Bilateral 01/07/2022   Procedure: BLEPHAROPLASTY UPPER EYELID; W/ EXCESS SKIN BILATERAL;  Surgeon: Ashley Greig HERO, MD;  Location: [facility];  Service: Ophthalmology;  Laterality: Bilateral;   BUNIONECTOMY     CAPSULOTOMY METATARSOPHALANGEAL Left 04/19/2017   Procedure: CAPSULOTOMY METATARSOPHALANGEAL;  Surgeon: Ashley Soulier, DPM;  Location: [facility];  Service: Podiatry;  Laterality: Left;   CATARACT EXTRACTION W/PHACO Right 08/16/2021   Procedure: CATARACT EXTRACTION PHACO AND INTRAOCULAR LENS PLACEMENT (IOC) RIGHT Eyhance Toric;  Surgeon: Myrna Adine Anes, MD;  Location: [facility];  Service: Ophthalmology;  Laterality: Right;  5.02 00:33.8   CATARACT EXTRACTION W/PHACO Left 08/30/2021   Procedure: CATARACT EXTRACTION PHACO AND INTRAOCULAR LENS PLACEMENT (IOC) LEFT 5.10 00:43.4;  Surgeon: Myrna Adine Anes, MD;  Location: [facility];  Service: Ophthalmology;  Laterality: Left;   COLECTOMY     COLONOSCOPY N/A 06/28/2024   Procedure: COLONOSCOPY;  Surgeon: Onita Elspeth Sharper, DO;  Location: [facility];  Service: Gastroenterology;  Laterality: N/A;   COLONOSCOPY WITH PROPOFOL  N/A 09/26/2017   Procedure: COLONOSCOPY WITH PROPOFOL ;  Surgeon: Gaylyn Gladis PENNER, MD;  Location: [facility];  Service: Endoscopy;  Laterality: N/A;   ESOPHAGOGASTRODUODENOSCOPY N/A 04/21/2015  Procedure: ESOPHAGOGASTRODUODENOSCOPY (EGD);  Surgeon: Gladis RAYMOND Mariner, MD;  Location: [facility] END[facility];  Service: Endoscopy;  Laterality: N/A;   Esophgeal cancer     HAMMER TOE SURGERY Left 04/19/2017   Procedure: HAMMER TOE CORRECTION-3RD & 4TH;  Surgeon: Ashley Soulier, DPM;  Location: [facility];  Service: Podiatry;  Laterality: Left;   LOBECTOMY Right    METATARSAL OSTEOTOMY Left 04/19/2017   Procedure:  PHALANX OSTEOTOMY-AKIN - LEFT;  Surgeon: Ashley Soulier, DPM;  Location: [facility];  Service: Podiatry;  Laterality: Left;   POLYPECTOMY  06/28/2024   Procedure: POLYPECTOMY, INTESTINE;  Surgeon: Onita Elspeth Sharper, DO;  Location: [facility] ENDOSCOPY;  Service: Gastroenterology;;   SINUSOTOMY     TOTAL HIP ARTHROPLASTY Left 07/29/2024   Procedure: ARTHROPLASTY, HIP, TOTAL, ANTERIOR APPROACH;  Surgeon: Lorelle Hussar, MD;  Location: [facility];  Service: Orthopedics;  Laterality: Left;    Physical Exam   Triage Vital Signs: ED Triage Vitals  Encounter Vitals Group     BP 12/25/24 2054 (!) 173/143     Girls Systolic BP Percentile --      Girls Diastolic BP Percentile --      Boys Systolic BP Percentile --      Boys Diastolic BP Percentile --      Pulse Rate 12/25/24 2049 (!) 102     Resp 12/25/24 2049 (!) 25     Temp --      Temp src --      SpO2 12/25/24 2049 95 %     Weight --      Height --      Head Circumference --      Peak Flow --      Pain Score --      Pain Loc --      Pain Education --      Exclude from Growth Chart --     Most recent vital signs: Vitals:   12/25/24 2054 12/25/24 2112  BP: (!) 173/143   Pulse: (!) 102   Resp:    Temp:  98.3 F (36.8 C)  SpO2: 98%     General: Awake, moderate respiratory distress.  CV:  Good peripheral perfusion.  Tachycardia heart rate 100 Resp:  Tachypnea, respiratory rate of 25.  Accessory muscle use.  Diffuse expiratory wheezing and markedly prolonged expiratory phase Abd:  No distention.  Soft nontender Other:  No lower extremity edema   ED Results / Procedures / Treatments   Labs (all labs ordered are listed, but only abnormal results are displayed) Labs Reviewed  BASIC METABOLIC PANEL WITH GFR - Abnormal; Notable for the following components:      Result Value   Glucose, Bld 230 (*)    Calcium  8.8 (*)    All other components within normal limits  BLOOD GAS, VENOUS - Abnormal; Notable for the following  components:   pH, Ven 7.24 (*)    pCO2, Ven 66 (*)    Bicarbonate 28.3 (*)    All other components within normal limits  TROPONIN T, HIGH SENSITIVITY - Abnormal; Notable for the following components:   Troponin T High Sensitivity 22 (*)    All other components within normal limits  RESP PANEL BY RT-PCR (RSV, FLU A&B, COVID)  [id number]  CBC WITH DIFFERENTIAL/PLATELET  TROPONIN T, HIGH SENSITIVITY     EKG Interpreted by me Sinus rhythm rate of 99.  Left axis, poor R wave progression, left bundle branch block.  No acute ischemic changes.   RADIOLOGY Chest  x-ray interpreted by me, unremarkable.  Radiology report reviewed   PROCEDURES:  .Critical Care  Performed by: Viviann Pastor, MD Authorized by: Viviann Pastor, MD   Critical care provider statement:    Critical care time (minutes):  35   Critical care time was exclusive of:  Separately billable procedures and treating other patients   Critical care was necessary to treat or prevent imminent or life-threatening deterioration of the following conditions:  Respiratory failure   Critical care was time spent personally by me on the following activities:  Development of treatment plan with patient or surrogate, discussions with consultants, evaluation of patient's response to treatment, examination of patient, obtaining history from patient or surrogate, ordering and performing treatments and interventions, ordering and review of laboratory studies, ordering and review of radiographic studies, pulse oximetry, re-evaluation of patient's condition and review of old charts   Care discussed with: admitting provider      MEDICATIONS ORDERED IN ED: Medications - No data to display   IMPRESSION / MDM / ASSESSMENT AND PLAN / ED COURSE  I reviewed the triage vital signs and the nursing notes.  DDx: Pneumonia, COPD exacerbation, pleural effusion, AKI, electrolyte derangement  Patient's presentation is most consistent with acute  presentation with potential threat to life or bodily function.  Patient presents with respiratory distress, clinical picture very consistent with COPD exacerbation with hypoxia.  Mental status is appropriate, doubt CO2 narcosis.  Unlikely ACS PE or dissection.  Chest x-ray and initial lab workup unremarkable except for respiratory acidosis.  Patient placed on BiPAP which she has required in the past.  Will plan to admit.   Clinical Course as of 12/25/24 2238  Wed Dec 25, 2024  2238 Case d/w hospitalist [PS]    Clinical Course User Index [PS] Viviann Pastor, MD     FINAL CLINICAL IMPRESSION(S) / ED DIAGNOSES   Final diagnoses:  COPD exacerbation (HCC)  Acute respiratory failure with hypoxia (HCC)     Rx / DC Orders   ED Discharge Orders     None        Note:  This document was prepared using Dragon voice recognition software and may include unintentional dictation errors.   Viviann Pastor, MD 12/25/24 2227    Viviann Pastor, MD 12/25/24 2239  "

## 2024-12-25 NOTE — ED Notes (Signed)
Patient resting in bed free from sign of distress. Breathing unlabored speaking in full sentences with symmetric chest rise and fall. Bed low and locked with side rails raised x2. Call bell in reach and monitor in place.   

## 2024-12-25 NOTE — ED Triage Notes (Signed)
 From home Hx of COPD/ Cancer esophageal. Daughter called while on the phone, stated she heard patient wheezing on the phone. Does not wear O2 at baseline, 82% on [facility] EMS arrival. EMS gave 3 duonebs, solumedrol, 2g Magnesium . No c/o CP

## 2024-12-26 ENCOUNTER — Inpatient Hospital Stay

## 2024-12-26 DIAGNOSIS — I480 Paroxysmal atrial fibrillation: Secondary | ICD-10-CM

## 2024-12-26 DIAGNOSIS — J9601 Acute respiratory failure with hypoxia: Secondary | ICD-10-CM

## 2024-12-26 DIAGNOSIS — E785 Hyperlipidemia, unspecified: Secondary | ICD-10-CM

## 2024-12-26 DIAGNOSIS — F32A Depression, unspecified: Secondary | ICD-10-CM | POA: Insufficient documentation

## 2024-12-26 LAB — BASIC METABOLIC PANEL WITH GFR
Anion gap: 11 (ref 5–15)
BUN: 15 mg/dL (ref 8–23)
CO2: 26 mmol/L (ref 22–32)
Calcium: 8.6 mg/dL — ABNORMAL LOW (ref 8.9–10.3)
Chloride: 101 mmol/L (ref 98–111)
Creatinine, Ser: 0.56 mg/dL (ref 0.44–1.00)
GFR, Estimated: >60 mL/min
Glucose, Bld: 174 mg/dL — ABNORMAL HIGH (ref 70–99)
Potassium: 3.8 mmol/L (ref 3.5–5.1)
Sodium: 139 mmol/L (ref 135–145)

## 2024-12-26 LAB — CBC
HCT: 36.3 % (ref 36.0–46.0)
Hemoglobin: 11.4 g/dL — ABNORMAL LOW (ref 12.0–15.0)
MCH: 29.9 pg (ref 26.0–34.0)
MCHC: 31.4 g/dL (ref 30.0–36.0)
MCV: 95.3 fL (ref 80.0–100.0)
Platelets: 251 10*3/uL (ref 150–400)
RBC: 3.81 MIL/uL — ABNORMAL LOW (ref 3.87–5.11)
RDW: 14 % (ref 11.5–15.5)
WBC: 6.3 10*3/uL (ref 4.0–10.5)
nRBC: 0 % (ref 0.0–0.2)

## 2024-12-26 MED ORDER — SODIUM CHLORIDE 0.9 % IV SOLN
1.0000 g | INTRAVENOUS | Status: AC
  Administered 2024-12-26 – 2024-12-27 (×2): 1 g via INTRAVENOUS
  Filled 2024-12-26 (×3): qty 10

## 2024-12-26 MED ORDER — DIPHENHYDRAMINE HCL 50 MG/ML IJ SOLN
12.5000 mg | Freq: Once | INTRAMUSCULAR | Status: AC
  Administered 2024-12-26: 12.5 mg via INTRAVENOUS
  Filled 2024-12-26: qty 1

## 2024-12-26 MED ORDER — FUROSEMIDE 10 MG/ML IJ SOLN
40.0000 mg | Freq: Once | INTRAMUSCULAR | Status: AC
  Administered 2024-12-26: 40 mg via INTRAVENOUS
  Filled 2024-12-26: qty 4

## 2024-12-26 MED ORDER — IOHEXOL 350 MG/ML SOLN
100.0000 mL | Freq: Once | INTRAVENOUS | Status: AC | PRN
  Administered 2024-12-26: 100 mL via INTRAVENOUS

## 2024-12-26 MED ORDER — METOCLOPRAMIDE HCL 5 MG/ML IJ SOLN
10.0000 mg | Freq: Once | INTRAMUSCULAR | Status: AC
  Administered 2024-12-26: 10 mg via INTRAVENOUS
  Filled 2024-12-26: qty 2

## 2024-12-26 MED ORDER — DOCUSATE SODIUM 100 MG PO CAPS
100.0000 mg | ORAL_CAPSULE | Freq: Two times a day (BID) | ORAL | Status: AC
  Administered 2024-12-26 – 2024-12-27 (×4): 100 mg via ORAL
  Filled 2024-12-26 (×4): qty 1

## 2024-12-26 MED ORDER — VENLAFAXINE HCL ER 75 MG PO CP24
150.0000 mg | ORAL_CAPSULE | Freq: Every day | ORAL | Status: AC
  Administered 2024-12-26 – 2024-12-27 (×2): 150 mg via ORAL
  Filled 2024-12-26 (×2): qty 1

## 2024-12-26 MED ORDER — ONDANSETRON HCL 4 MG/2ML IJ SOLN
4.0000 mg | Freq: Four times a day (QID) | INTRAMUSCULAR | Status: AC | PRN
  Administered 2024-12-26: 4 mg via INTRAVENOUS
  Filled 2024-12-26: qty 2

## 2024-12-26 MED ORDER — ACETAMINOPHEN 325 MG PO TABS
650.0000 mg | ORAL_TABLET | Freq: Four times a day (QID) | ORAL | Status: AC | PRN
  Administered 2024-12-26 – 2024-12-27 (×4): 650 mg via ORAL
  Filled 2024-12-26 (×5): qty 2

## 2024-12-26 MED ORDER — FUROSEMIDE 40 MG PO TABS: 20.0000 mg | ORAL_TABLET | Freq: Every day | ORAL | Status: DC

## 2024-12-26 MED ORDER — SENNOSIDES-DOCUSATE SODIUM 8.6-50 MG PO TABS: 2.0000 | ORAL_TABLET | Freq: Two times a day (BID) | ORAL | Status: AC | PRN

## 2024-12-26 MED ORDER — METHYLPREDNISOLONE SODIUM SUCC 40 MG IJ SOLR
40.0000 mg | Freq: Two times a day (BID) | INTRAMUSCULAR | Status: AC
  Administered 2024-12-26 (×2): 40 mg via INTRAVENOUS
  Filled 2024-12-26 (×2): qty 1

## 2024-12-26 MED ORDER — KETOROLAC TROMETHAMINE 15 MG/ML IJ SOLN
15.0000 mg | Freq: Once | INTRAMUSCULAR | Status: AC
  Administered 2024-12-26: 15 mg via INTRAVENOUS
  Filled 2024-12-26: qty 1

## 2024-12-26 MED ORDER — OXYCODONE-ACETAMINOPHEN 5-325 MG PO TABS
1.0000 | ORAL_TABLET | Freq: Once | ORAL | Status: AC
  Administered 2024-12-26: 1 via ORAL
  Filled 2024-12-26: qty 1

## 2024-12-26 MED ORDER — ACETAMINOPHEN 650 MG RE SUPP: 650.0000 mg | Freq: Four times a day (QID) | RECTAL | Status: AC | PRN

## 2024-12-26 MED ORDER — PANTOPRAZOLE SODIUM 40 MG PO TBEC
40.0000 mg | DELAYED_RELEASE_TABLET | Freq: Every day | ORAL | Status: AC
  Administered 2024-12-26 – 2024-12-27 (×2): 40 mg via ORAL
  Filled 2024-12-26 (×3): qty 1

## 2024-12-26 MED ORDER — ONDANSETRON HCL 4 MG PO TABS
4.0000 mg | ORAL_TABLET | Freq: Four times a day (QID) | ORAL | Status: AC | PRN
  Administered 2024-12-26: 4 mg via ORAL
  Filled 2024-12-26: qty 1

## 2024-12-26 MED ORDER — BIOTIN 5 MG PO TABS: ORAL_TABLET | Freq: Every day | ORAL | Status: DC

## 2024-12-26 MED ORDER — ATORVASTATIN CALCIUM 20 MG PO TABS
40.0000 mg | ORAL_TABLET | Freq: Every day | ORAL | Status: AC
  Administered 2024-12-26 – 2024-12-27 (×2): 40 mg via ORAL
  Filled 2024-12-26 (×2): qty 2

## 2024-12-26 MED ORDER — SOTALOL HCL 80 MG PO TABS
80.0000 mg | ORAL_TABLET | Freq: Two times a day (BID) | ORAL | Status: AC
  Administered 2024-12-26 – 2024-12-27 (×4): 80 mg via ORAL
  Filled 2024-12-26 (×4): qty 1

## 2024-12-26 MED ORDER — LIDOCAINE 5 % EX PTCH: 1.0000 | MEDICATED_PATCH | CUTANEOUS | Status: DC

## 2024-12-26 MED ORDER — APIXABAN 5 MG PO TABS
5.0000 mg | ORAL_TABLET | Freq: Two times a day (BID) | ORAL | Status: AC
  Administered 2024-12-26 – 2024-12-27 (×4): 5 mg via ORAL
  Filled 2024-12-26 (×4): qty 1

## 2024-12-26 MED ORDER — PREDNISONE 20 MG PO TABS
40.0000 mg | ORAL_TABLET | Freq: Every day | ORAL | Status: AC
  Administered 2024-12-27: 40 mg via ORAL
  Filled 2024-12-26: qty 2

## 2024-12-26 MED ORDER — SODIUM CHLORIDE 0.9 % IV SOLN: INTRAVENOUS | Status: DC

## 2024-12-26 MED ORDER — TRAZODONE HCL 50 MG PO TABS: 25.0000 mg | ORAL_TABLET | Freq: Every evening | ORAL | Status: AC | PRN

## 2024-12-26 NOTE — ED Notes (Signed)
 Assisted pt to bedside commode.

## 2024-12-26 NOTE — H&P (Addendum)
 "     [place]   PATIENT NAME: Kelly Krause    MR#:  [id number]  DATE OF BIRTH:  1941-06-10  DATE OF ADMISSION:  12/25/2024  PRIMARY CARE PHYSICIAN: Bertrum Charlie CROME, MD   Patient is coming from: Home  REQUESTING/REFERRING PHYSICIAN: Viviann Mungo, MD  CHIEF COMPLAINT:   Chief Complaint  Patient presents with   Respiratory Distress    HISTORY OF PRESENT ILLNESS:  Kelly Krause is a 84 y.o. female with medical history significant for atrial fibrillation, on Eliquis , type II diabetes mellitus, GERD, hypertension comes lipidemia, depression, CVA on lung cancer, who presented to the emergency room with acute onset of worsening dyspnea with associated cough productive of yellow sputum as well as wheezing all day today.  She denies any fever or chills.  No nausea or vomiting or abdominal pain.  No chest pain or palpitations.  No dysuria, oliguria or hematuria or flank pain.  ED Course: When she came to the ER, her rate was 102 with respiratory rate of 25 and pulse extremity 95% on 4 L of O2 via nasal cannula and BP was 173/143.  Labs revealed VBG with pH 7.24 and HCO3 28.3 with pCO2 66.  BMP showed blood glucose of 230 and high-sensitivity troponin T was 27 CBC was normal respiratory panel came back negative EKG as reviewed by me : EKG showed sinus tachycardia with a rate of 100 with left bundle branch block. Imaging: Portable chest x-ray showed the following: 1.  Central pulmonary vascular congestion. 2. Focal opacity in the inferior right upper lobe. Consider follow-up CT given that this is unresolved from november. 3. Stable right chest port tip projects over the SVC.  The patient was given several 50 mg p.o. Tylenol  and nebulized albuterol .  She will be admitted to a telemetry bed for further evaluation and management. PAST MEDICAL HISTORY:   Past Medical History:  Diagnosis Date   Allergy    Anticoagulated on apixaban     Atrial fibrillation (HCC)    Colon cancer  (HCC)    chemo/rad 15 yrs   DDD (degenerative disc disease), cervical    DM (diabetes mellitus), type 2 (HCC)    controlled no medications   Elevated lipids    Esophageal cancer (HCC)    GERD (gastroesophageal reflux disease)    History of blood transfusion    Hyperlipidemia    Hypertension    Lung cancer (HCC)    chemo   Major depressive disorder, recurrent episode, moderate (HCC)    Osteoarthritis of left hip    Persistent disorder of initiating or maintaining sleep    Personal history of chemotherapy    Personal history of radiation therapy    colon   Stroke (HCC) 06/03/2021   No Deficits   SVT (supraventricular tachycardia)     PAST SURGICAL HISTORY:   Past Surgical History:  Procedure Laterality Date   ABDOMINAL HYSTERECTOMY     ATRIAL FIBRILLATION ABLATION     BROW LIFT Bilateral 01/07/2022   Procedure: BLEPHAROPLASTY UPPER EYELID; W/ EXCESS SKIN BILATERAL;  Surgeon: Ashley Greig HERO, MD;  Location: [facility];  Service: Ophthalmology;  Laterality: Bilateral;   BUNIONECTOMY     CAPSULOTOMY METATARSOPHALANGEAL Left 04/19/2017   Procedure: CAPSULOTOMY METATARSOPHALANGEAL;  Surgeon: Ashley Soulier, DPM;  Location: [facility];  Service: Podiatry;  Laterality: Left;   CATARACT EXTRACTION W/PHACO Right 08/16/2021   Procedure: CATARACT EXTRACTION PHACO AND INTRAOCULAR LENS PLACEMENT (IOC) RIGHT Eyhance Toric;  Surgeon: Myrna Adine Anes, MD;  Location: [facility];  Service: Ophthalmology;  Laterality: Right;  5.02 00:33.8   CATARACT EXTRACTION W/PHACO Left 08/30/2021   Procedure: CATARACT EXTRACTION PHACO AND INTRAOCULAR LENS PLACEMENT (IOC) LEFT 5.10 00:43.4;  Surgeon: Myrna Adine Anes, MD;  Location: [facility];  Service: Ophthalmology;  Laterality: Left;   COLECTOMY     COLONOSCOPY N/A 06/28/2024   Procedure: COLONOSCOPY;  Surgeon: Onita Elspeth Sharper, DO;  Location: [facility];  Service: Gastroenterology;  Laterality: N/A;    COLONOSCOPY WITH PROPOFOL  N/A 09/26/2017   Procedure: COLONOSCOPY WITH PROPOFOL ;  Surgeon: Gaylyn Gladis PENNER, MD;  Location: [facility];  Service: Endoscopy;  Laterality: N/A;   ESOPHAGOGASTRODUODENOSCOPY N/A 04/21/2015   Procedure: ESOPHAGOGASTRODUODENOSCOPY (EGD);  Surgeon: Gladis PENNER Gaylyn, MD;  Location: [facility];  Service: Endoscopy;  Laterality: N/A;   Esophgeal cancer     HAMMER TOE SURGERY Left 04/19/2017   Procedure: HAMMER TOE CORRECTION-3RD & 4TH;  Surgeon: Ashley Soulier, DPM;  Location: [facility];  Service: Podiatry;  Laterality: Left;   LOBECTOMY Right    METATARSAL OSTEOTOMY Left 04/19/2017   Procedure: PHALANX OSTEOTOMY-AKIN - LEFT;  Surgeon: Ashley Soulier, DPM;  Location: [facility];  Service: Podiatry;  Laterality: Left;   POLYPECTOMY  06/28/2024   Procedure: POLYPECTOMY, INTESTINE;  Surgeon: Onita Elspeth Sharper, DO;  Location: [facility];  Service: Gastroenterology;;   SINUSOTOMY     TOTAL HIP ARTHROPLASTY Left 07/29/2024   Procedure: ARTHROPLASTY, HIP, TOTAL, ANTERIOR APPROACH;  Surgeon: Lorelle Hussar, MD;  Location: [facility];  Service: Orthopedics;  Laterality: Left;    SOCIAL HISTORY:   Social History   Tobacco Use   Smoking status: Former    Types: Cigarettes   Smokeless tobacco: Never  Substance Use Topics   Alcohol use: Yes    Comment: wine and beer occ    FAMILY HISTORY:   Family History  Problem Relation Age of Onset   Stroke Mother    Depression Mother    Breast cancer Mother 29   Breast cancer Maternal Aunt 50   Alcohol abuse Father    Stroke Father    Hypertension Father     DRUG ALLERGIES:  Allergies[1]  REVIEW OF SYSTEMS:   ROS As per history of present illness. All pertinent systems were reviewed above. Constitutional, HEENT, cardiovascular, respiratory, GI, GU, musculoskeletal, neuro, psychiatric, endocrine, integumentary and hematologic systems were reviewed and are otherwise  negative/unremarkable except for positive findings mentioned above in the HPI.   MEDICATIONS AT HOME:   Prior to Admission medications  Medication Sig Start Date End Date Taking? Authorizing Provider  acetaminophen  (TYLENOL ) 500 MG tablet Take 2 tablets (1,000 mg total) by mouth every 8 (eight) hours. 07/31/24  Yes Charlene Debby BROCKS, PA-C  apixaban  (ELIQUIS ) 5 MG TABS tablet Take 1 tablet (5 mg total) by mouth 2 (two) times daily. 06/21/21 12/26/24 Yes Sreenath, Sudheer B, MD  atorvastatin  (LIPITOR ) 40 MG tablet Take 1 tablet (40 mg total) by mouth daily. 06/06/21 12/26/24 Yes Sreenath, Sudheer B, MD  azelastine (ASTELIN) 0.1 % nasal spray Place 1 spray into the nose 2 (two) times daily. 12/03/24 12/03/25 Yes [provider]  BIOTIN  PO Take 1 tablet by mouth daily.   Yes [provider]  diclofenac Sodium (VOLTAREN) 1 % GEL Apply 2 g topically 4 (four) times daily as needed. 12/05/24 12/05/25 Yes [provider]  diphenhydrAMINE  (BENADRYL ) 25 mg capsule Take 50-75 mg every 6 (six) hours as needed by mouth for itching.    Yes [provider]  docusate sodium  (COLACE) 100 MG capsule Take 1 capsule (100 mg total) by mouth 2 (two) times daily. 07/31/24  Yes Charlene Debby BROCKS, PA-C  fexofenadine (ALLEGRA) 180 MG tablet Take 180 mg by mouth at bedtime.   Yes [provider]  fluticasone  (FLONASE ) 50 MCG/ACT nasal spray USE ONE SPRAY(S) IN EACH NOSTRIL ONCE DAILY Patient taking differently: Place 2 sprays into both nostrils daily. 11/01/16  Yes Joshua Cathryne BROCKS, MD  furosemide  (LASIX ) 20 MG tablet Take 20 mg by mouth every morning.   Yes [provider]  pantoprazole  (PROTONIX ) 40 MG tablet Take 1 tablet (40 mg total) by mouth daily. 10/07/24  Yes Laurita Pillion, MD  promethazine  (PHENERGAN ) 12.5 MG tablet Take 12.5 mg by mouth as directed. 12/03/24  Yes [provider]  senna-docusate (SENOKOT-S) 8.6-50 MG tablet Take 2 tablets by mouth 2 (two) times daily  as needed for mild constipation. 10/07/24  Yes Laurita Pillion, MD  sotalol  (BETAPACE ) 80 MG tablet Take 80 mg by mouth 2 (two) times daily. 07/25/24  Yes [provider]  venlafaxine  XR (EFFEXOR -XR) 150 MG 24 hr capsule Take 150 mg by mouth daily with breakfast.   Yes [provider]  Multiple Vitamins-Minerals (OCUVITE ADULT 50+ PO) Take 1 tablet by mouth daily. Patient not taking: Reported on 12/26/2024    [provider]  sucralfate  (CARAFATE ) 1 GM/10ML suspension Take by mouth. Cancer Doc 10/20/15 07/09/19  [provider]      VITAL SIGNS:  Blood pressure (!) 141/57, pulse 66, temperature 98.3 F (36.8 C), temperature source Oral, resp. rate 16, SpO2 100%.  PHYSICAL EXAMINATION:  Physical Exam  GENERAL:  84 y.o.-year-old Caucasian female patient lying in the bed with no acute distress.  EYES: Pupils equal, round, reactive to light and accommodation. No scleral icterus. Extraocular muscles intact.  HEENT: Head atraumatic, normocephalic. Oropharynx and nasopharynx clear.  NECK:  Supple, no jugular venous distention. No thyroid  enlargement, no tenderness.  LUNGS: Diffuse expiratory wheezes with tight expiratory airflow and harsh vesicular breathing.  No use of accessory muscles of respiration.  CARDIOVASCULAR: Regular rate and rhythm, S1, S2 normal. No murmurs, rubs, or gallops.  ABDOMEN: Soft, nondistended, nontender. Bowel sounds present. No organomegaly or mass.  EXTREMITIES: No pedal edema, cyanosis, or clubbing.  NEUROLOGIC: Cranial nerves II through XII are intact. Muscle strength 5/5 in all extremities. Sensation intact. Gait not checked.  PSYCHIATRIC: The patient is alert and oriented x 3.  Normal affect and good eye contact. SKIN: No obvious rash, lesion, or ulcer.   LABORATORY PANEL:   CBC Recent Labs  Lab 12/25/24 2050  WBC 10.3  HGB 13.1  HCT 41.0  PLT 307    ------------------------------------------------------------------------------------------------------------------  Chemistries  Recent Labs  Lab 12/25/24 2050  NA 139  K 3.7  CL 100  CO2 27  GLUCOSE 230*  BUN 17  CREATININE 0.66  CALCIUM  8.8*   ------------------------------------------------------------------------------------------------------------------  Cardiac Enzymes No results for input(s): TROPONINI in the last 168 hours. ------------------------------------------------------------------------------------------------------------------  RADIOLOGY:  DG Chest Portable 1 View Result Date: 12/25/2024 EXAM: 1 VIEW(S) XRAY OF THE CHEST 12/25/2024 09:10:00 PM COMPARISON: 10/02/2024 CLINICAL HISTORY: Shortness of breath. FINDINGS: LINES, TUBES AND DEVICES: Right chest port with tip terminating over the SVC. LUNGS AND PLEURA: Small right greater than left pleural effusions. Diffuse interstitial prominence with more focal airspace opacity in the right mid lung. No pneumothorax. HEART AND MEDIASTINUM: Atherosclerotic calcifications. No acute abnormality of the cardiac and mediastinal silhouettes. BONES AND SOFT  TISSUES: No acute osseous abnormality. IMPRESSION: 1.  Central pulmonary vascular congestion. 2. Focal opacity in the inferior right upper lobe. Consider follow-up CT given that this is unresolved from november. 3. Stable right chest port tip projects over the SVC. Electronically signed by: Greig Pique MD 12/25/2024 09:17 PM EST RP Workstation: [id number]      IMPRESSION AND PLAN:  Assessment and Plan: * Acute respiratory failure with hypoxia and hypercarbia (HCC) - The patient will be admitted to a telemetry bed. - She initially required BiPAP and was tapered to nasal cannula. - She will be placed on antibiotic therapy with IV Rocephin . - Bronchodilator therapy will be provided with DuoNebs 4 times daily and every 4 hours as needed. - Steroid therapy will be provided  with IV Solu-Medrol . - Mucolytic therapy will be provided. - She has a right upper lobe opacity that will need follow-up chest CT that was ordered  Hypertensive urgency - She will be placed on as needed IV labetalol  and hydralazine .  Paroxysmal atrial fibrillation (HCC) - Will continue Eliquis  and sotalol .  Dyslipidemia - Continue statin therapy.  Depression - Will continue Effexor  XR.       DVT prophylaxis: Eliquis  Advanced Care Planning:  Code Status: full code. Family Communication:  The plan of care was discussed in details with the patient (and family). I answered all questions. The patient agreed to proceed with the above mentioned plan. Further management will depend upon hospital course. Disposition Plan: Back to previous home environment Consults called: none.  All the records are reviewed and case discussed with ED provider.  Status is: Inpatient    At the time of the admission, it appears that the appropriate admission status for this patient is inpatient.  This is judged to be reasonable and necessary in order to provide the required intensity of service to ensure the patient's safety given the presenting symptoms, physical exam findings and initial radiographic and laboratory data in the context of comorbid conditions.  The patient requires inpatient status due to high intensity of service, high risk of further deterioration and high frequency of surveillance required.  I certify that at the time of admission, it is my clinical judgment that the patient will require inpatient hospital care extending more than 2 midnights.                            Dispo: The patient is from: Home              Anticipated d/c is to: Home              Patient currently is not medically stable to d/c.              Difficult to place patient: No  Madison DELENA Peaches M.D on 12/26/2024 at 5:06 AM  [facility]   From 7 PM-7 AM, contact night-coverage [email or web address]  CC: Primary care  physician; Bertrum Charlie CROME, MD     [1]  Allergies Allergen Reactions   Ensure Itching and Swelling    Pt stated her throat itched after drinking chocolate ensure and vanilla.    Amoxicillin-Pot Clavulanate Itching, Nausea Only and Other (See Comments)    Has patient had a PCN reaction causing immediate rash, facial/tongue/throat swelling, SOB or lightheadedness with hypotension: No Has patient had a PCN reaction causing severe rash involving mucus membranes or skin necrosis: No Has patient had a PCN reaction that required hospitalization: No Has patient  had a PCN reaction occurring within the last 10 years: Yes If all of the above answers are NO, then may proceed with Cephalosporin use.    Codeine Nausea Only and Other (See Comments)    GI Upset headaches   Demerol [Meperidine] Itching, Nausea Only and Other (See Comments)    GI Upset, Headaches    Doxycycline  Hives and Itching   Ferrous Fumarate Nausea And Vomiting, Nausea Only and Other (See Comments)   Latex Hives and Itching   Morphine  And Codeine Itching, Nausea Only and Other (See Comments)    Headaches, pt can still take    Oxycodone  Nausea And Vomiting and Other (See Comments)   Oxycodone -Acetaminophen  Itching and Nausea Only   Pollen Extract Hives, Itching and Swelling   Tramadol Other (See Comments)     GI Upset   Buprenorphine Hcl Itching, Nausea Only and Other (See Comments)    Headaches   Fentanyl  Itching and Nausea And Vomiting   Strawberry Extract Nausea And Vomiting and Rash   Tapentadol Rash   "

## 2024-12-26 NOTE — Assessment & Plan Note (Signed)
Will continue Effexor XR.

## 2024-12-26 NOTE — ED Notes (Signed)
 Pt ambulatory to restroom at this time.

## 2024-12-26 NOTE — Assessment & Plan Note (Addendum)
-   Will continue Eliquis  and sotalol .

## 2024-12-26 NOTE — Assessment & Plan Note (Signed)
 Continue statin therapy

## 2024-12-26 NOTE — Progress Notes (Signed)
 " PROGRESS NOTE    Kelly Krause   FMW:[id number] DOB: Jan 09, 1941  DOA: 12/25/2024 Date of Service: 12/26/24 which is hospital day 1  PCP: Bertrum Charlie CROME, MD    Kelly Krause is a 84 y.o. female with medical history significant for atrial fibrillation, on Eliquis , type II diabetes mellitus, GERD, hypertension comes lipidemia, depression, CVA on lung cancer, who presented to the emergency room 12/25/2024 from home via EMS with  Chief Complaint  Patient presents with   Respiratory Distress    HPI: worsening dyspnea with associated cough productive of yellow sputum as well as wheezing all day today. She denies any fever or chills. Does not wear O2 at baseline, 82% on [facility] arrival. EMS gave 3 duonebs, solumedrol, 2g Magnesium . No c/o CP   Hospital course / significant events: 02/04: to ED. HR 102-->79, RR 25-->22-->19, SpO2 95% 4L/min [place]. VBG no concerns. Troponin flat, 20s. CBC and BMP no concerns other than hyperglycemic 230, COVID/flu/RSV neg. CXR vascular congestion and RUL opacity unresolved from 09/2024. CTA Chest no PE, small bl pleural effusions, worsening mediastinal adenopathy wuestion metastatic consider PET follow up, likely congestive edema apices/bases, cardiomegaly and central venous congestion, s/p RLL lobectomy.  02/05: Lasix  IV x1 Net IO Since Admission: -1,000 mL [12/26/24 1755], continuing tx CAP/COPD       Consultants:  none  Procedures/Surgeries: none      ASSESSMENT & PLAN:   Acute respiratory failure with hypoxia and hypercarbia  COPD vs CHF  No O2 at baseline Bipap --> [place] here  Treat underlying cause(s) as below   COPD question exacerbation  RUL lung opacity uncertain clinical significance  O2 supplementation as above  IV Rocephin  DuoNebs 4 times daily and every 4 hours as needed. IV Solu-Medrol .  Pulmonary vascular congestion  Echo 09/2024 EF 60-65, normal diastolic function, moderate MR.  Question HFpEF d/t HTN urgency   Hypertensive  urgency Strict in/out Lasix  40 mg IV x1 today   Paroxysmal atrial fibrillation Eliquis   sotalol .   Dyslipidemia CAD statin   Depression Effexor  XR.  GERD PPI   N/a based on BMI: There is no height or weight on file to calculate BMI.. Significantly low or high BMI is associated with higher medical risk.  Underweight - under 18  overweight - 25 to 29 obese - 30 or more Class 1 obesity: BMI of 30.0 to 34 Class 2 obesity: BMI of 35.0 to 39 Class 3 obesity: BMI of 40.0 to 49 Super Morbid Obesity: BMI 50-59 Super-super Morbid Obesity: BMI 60+ Healthy nutrition and physical activity advised as adjunct to other disease management and risk reduction treatments    DVT prophylaxis: eliquis  IV fluids: no continuous IV fluids  Nutrition: cardiac  Central lines / other devices: none  Code Status: FULL CODE ACP documentation reviewed:  none on file in [facility]  TOC needs: TBD Medical barriers to dispo at this time: diuresing, iv abx, o2 requirement . Expected readiness for discharge per TOC at this time:               Subjective / Brief ROS:  Patient reports no concerns Denies CP/SOB at rest.  Pain controlled.  Denies new weakness.  Tolerating diet .  Reports no concerns w/ urination/defecation.   Family Communication: none at this time     Objective Findings:  Vitals:   12/26/24 1130 12/26/24 1330 12/26/24 1730 12/26/24 1732  BP: (!) 154/68 139/72 (!) 147/73   Pulse: 71 64 62   Resp: 16 17  14   Temp:    98.1 F (36.7 C)  TempSrc:    Oral  SpO2: 100% 100% 100%     Intake/Output Summary (Last 24 hours) at 12/26/2024 1756 Last data filed at 12/26/2024 1147 Gross per 24 hour  Intake --  Output 1000 ml  Net -1000 ml   There were no vitals filed for this visit.  Examination:  Physical Exam Constitutional:      General: She is not in acute distress. Cardiovascular:     Rate and Rhythm: Normal rate and regular rhythm.  Pulmonary:     Effort:  Pulmonary effort is normal. No respiratory distress.     Breath sounds: Decreased air movement present. Rales present.  Musculoskeletal:     Right lower leg: No edema.     Left lower leg: No edema.  Neurological:     Mental Status: She is alert and oriented to person, place, and time. Mental status is at baseline.  Psychiatric:        Mood and Affect: Mood normal.        Behavior: Behavior normal.          Scheduled Medications:   apixaban   5 mg Oral BID   atorvastatin   40 mg Oral Daily   docusate sodium   100 mg Oral BID   pantoprazole   40 mg Oral Daily   [START ON 12/27/2024] predniSONE   40 mg Oral Q breakfast   sotalol   80 mg Oral BID   venlafaxine  XR  150 mg Oral Q breakfast    Continuous Infusions:  cefTRIAXone  (ROCEPHIN )  IV Stopped (12/26/24 0148)    PRN Medications:  acetaminophen  **OR** acetaminophen , labetalol , ondansetron  **OR** ondansetron  (ZOFRAN ) IV, senna-docusate, traZODone   Antimicrobials from admission:  Anti-infectives (From admission, onward)    Start     Dose/Rate Route Frequency Ordered Stop   12/26/24 0100  cefTRIAXone  (ROCEPHIN ) 1 g in sodium chloride  0.9 % 100 mL IVPB        1 g 200 mL/hr over 30 Minutes Intravenous Every 24 hours 12/26/24 0054 12/31/24 0059           Data Reviewed:  I have personally reviewed the following...  CBC: Recent Labs  Lab 12/25/24 2050 12/26/24 0638  WBC 10.3 6.3  NEUTROABS 7.2  --   HGB 13.1 11.4*  HCT 41.0 36.3  MCV 96.5 95.3  PLT 307 251   Basic Metabolic Panel: Recent Labs  Lab 12/25/24 2050 12/26/24 0638  NA 139 139  K 3.7 3.8  CL 100 101  CO2 27 26  GLUCOSE 230* 174*  BUN 17 15  CREATININE 0.66 0.56  CALCIUM  8.8* 8.6*   GFR: CrCl cannot be calculated (Unknown ideal weight.). Liver Function Tests: No results for input(s): AST, ALT, ALKPHOS, BILITOT, PROT, ALBUMIN in the last 168 hours. No results for input(s): LIPASE, AMYLASE in the last 168 hours. No results for  input(s): AMMONIA in the last 168 hours. Coagulation Profile: No results for input(s): INR, PROTIME in the last 168 hours. Cardiac Enzymes: No results for input(s): CKTOTAL, CKMB, CKMBINDEX, TROPONINI in the last 168 hours. BNP (last 3 results) Recent Labs    10/02/24 1132  PROBNP 4,519.0*   HbA1C: No results for input(s): HGBA1C in the last 72 hours. CBG: No results for input(s): GLUCAP in the last 168 hours. Lipid Profile: No results for input(s): CHOL, HDL, LDLCALC, TRIG, CHOLHDL, LDLDIRECT in the last 72 hours. Thyroid  Function Tests: No results for input(s): TSH, T4TOTAL, FREET4, T3FREE, THYROIDAB  in the last 72 hours. Anemia Panel: No results for input(s): VITAMINB12, FOLATE, FERRITIN, TIBC, IRON, RETICCTPCT in the last 72 hours. Most Recent Urinalysis On File:     Component Value Date/Time   COLORURINE YELLOW (A) 07/19/2024 0951   APPEARANCEUR CLEAR (A) 07/19/2024 0951   LABSPEC 1.016 07/19/2024 0951   PHURINE 5.0 07/19/2024 0951   GLUCOSEU NEGATIVE 07/19/2024 0951   HGBUR NEGATIVE 07/19/2024 0951   BILIRUBINUR NEGATIVE 07/19/2024 0951   KETONESUR NEGATIVE 07/19/2024 0951   PROTEINUR NEGATIVE 07/19/2024 0951   NITRITE NEGATIVE 07/19/2024 0951   LEUKOCYTESUR NEGATIVE 07/19/2024 0951   Sepsis Labs: @LABRCNTIP (procalcitonin:4,lacticidven:4) Microbiology: Recent Results (from the past 240 hours)  Resp panel by RT-PCR (RSV, Flu A&B, Covid) Anterior Nasal Swab     Status: None   Collection Time: 12/25/24  8:51 PM   Specimen: Anterior Nasal Swab  Result Value Ref Range Status   SARS Coronavirus 2 by RT PCR NEGATIVE NEGATIVE Final    Comment: (NOTE) SARS-CoV-2 target nucleic acids are NOT DETECTED.  The SARS-CoV-2 RNA is generally detectable in upper respiratory specimens during the acute phase of infection. The lowest concentration of SARS-CoV-2 viral copies this assay can detect is 138 copies/mL. A negative result  does not preclude SARS-Cov-2 infection and should not be used as the sole basis for treatment or other patient management decisions. A negative result may occur with  improper specimen collection/handling, submission of specimen other than nasopharyngeal swab, presence of viral mutation(s) within the areas targeted by this assay, and inadequate number of viral copies(<138 copies/mL). A negative result must be combined with clinical observations, patient history, and epidemiological information. The expected result is Negative.  Fact Sheet for Patients:  bloggercourse.com  Fact Sheet for Healthcare Providers:  seriousbroker.it  This test is no t yet approved or cleared by the [place]  FDA and  has been authorized for detection and/or diagnosis of SARS-CoV-2 by FDA under an Emergency Use Authorization (EUA). This EUA will remain  in effect (meaning this test can be used) for the duration of the COVID-19 declaration under Section 564(b)(1) of the Act, 21 U.S.C.section 360bbb-3(b)(1), unless the authorization is terminated  or revoked sooner.       Influenza A by PCR NEGATIVE NEGATIVE Final   Influenza B by PCR NEGATIVE NEGATIVE Final    Comment: (NOTE) The Xpert Xpress SARS-CoV-2/FLU/RSV plus assay is intended as an aid in the diagnosis of influenza from Nasopharyngeal swab specimens and should not be used as a sole basis for treatment. Nasal washings and aspirates are unacceptable for Xpert Xpress SARS-CoV-2/FLU/RSV testing.  Fact Sheet for Patients: bloggercourse.com  Fact Sheet for Healthcare Providers: seriousbroker.it  This test is not yet approved or cleared by the [place]  FDA and has been authorized for detection and/or diagnosis of SARS-CoV-2 by FDA under an Emergency Use Authorization (EUA). This EUA will remain in effect (meaning this test can be used) for  the duration of the COVID-19 declaration under Section 564(b)(1) of the Act, 21 U.S.C. section 360bbb-3(b)(1), unless the authorization is terminated or revoked.     Resp Syncytial Virus by PCR NEGATIVE NEGATIVE Final    Comment: (NOTE) Fact Sheet for Patients: bloggercourse.com  Fact Sheet for Healthcare Providers: seriousbroker.it  This test is not yet approved or cleared by the [place]  FDA and has been authorized for detection and/or diagnosis of SARS-CoV-2 by FDA under an Emergency Use Authorization (EUA). This EUA will remain in effect (meaning this test can be used) for the duration  of the COVID-19 declaration under Section 564(b)(1) of the Act, 21 U.S.C. section 360bbb-3(b)(1), unless the authorization is terminated or revoked.  Performed at [facility], [address], [place], [place] [address]       Radiology Studies last 3 days: CT Angio Chest Pulmonary Embolism (PE) W or WO Contrast Result Date: 12/26/2024 EXAM: CTA of the Chest with contrast for PE 12/26/2024 05:48:04 AM TECHNIQUE: CTA of the chest was performed after the administration of 100 mL of iohexol  (OMNIPAQUE ) 350 MG/ML injection. Multiplanar reformatted images are provided for review. MIP images are provided for review. Automated exposure control, iterative reconstruction, and/or weight based adjustment of the mA/kV was utilized to reduce the radiation dose to as low as reasonably achievable. COMPARISON: Portable chest 12/25/2024, portable chest 10/02/2024, rib series with AP chest 08/19/2023, and chest abdomen and pelvis CT with IV contrast 10/03/2017. CLINICAL HISTORY: Pulmonary embolism (PE) suspected, low to intermediate probability, positive D-dimer; right upper lobe lung mass. Unresolved opacity right mid lung since last November as compared to older films, CT was recommended. Reportedly, there is a history of lung cancer and esophageal cancer,  with colon cancer diagnosed in 2018. FINDINGS: PULMONARY ARTERIES: Pulmonary arteries are adequately opacified for evaluation. Central pulmonary arteries are slightly prominent. No pulmonary embolism. Main pulmonary artery is normal in caliber. MEDIASTINUM: The heart is moderately enlarged compared to 2018. 3-vessel coronary artery calcifications are again noted. Atherosclerosis in the aorta and great vessels without aneurysm, dissection, or stenosis. The central pulmonary veins are prominent especially the superior pulmonary veins. There is a right chest IJ approach port catheter with its tip in the mid to distal SVC. Mild thickening in the esophageal wall appears similar to before, but with increased patulous appearance of the esophagus and scattered fluid in the lumen. LYMPH NODES: There is worsening mediastinal adenopathy. There is a right paratracheal lymph node on series 5 axial 47 now measuring 2 x 1.6 cm; in 2018, it was 9 x 8 mm. Just above this is an enlarged right paratracheal node measuring 1.4 x 1.1 cm, formerly 6 x 5 mm. To the left of the aortic arch there is a 1.7 x 1.7 cm node which was previously 10 x 8 mm. There are borderline prominent bilateral hilar lymph nodes, which were also previously smaller, measuring 8 mm in short axis on axial 66, 9 mm on axial 62. Axillary regions are clear. LUNGS AND PLEURA: There is increased interstitial thickening in the apices and bases, suspected most likely to represent congestive edema, less likely lymphangitic carcinomatosis. There is mosaic attenuation in the lung fields, which is probably due to air trapping. Ground-glass edema component is not fully excluded. There is evidence of old right lower lobectomy. Right paramediastinal surgical clips in the lower chest are again seen. There is increased atelectasis or consolidation in the lingular base and in the left lower lobe alongside the effusion. I do not see any lung nodules. There is a small layering left  pleural effusion. There is also a small pleural effusion on the right, with loculated fluid in the fissure corresponding to the right mid chest opacity noted on the recent chest films. Some of the right pleural fluid is partially loculated towards the lateral lower mid and lower thorax with areas of calcified pleural thickening again noted along the superior and posterior right hemidiaphragm. No pneumothorax. UPPER ABDOMEN: Limited images of the upper abdomen are unremarkable. SOFT TISSUES AND BONES: There is osteopenia and thoracic kyphosis; interbody ankylosis at 3  contiguous mid thoracic levels. No destructive bone lesions. No acute soft tissue abnormality. IMPRESSION: 1. No evidence of pulmonary embolism. 2. Small bilateral pleural effusions, with loculated fluid in the right fissure corresponding to the right mid chest opacity noted on recent chest films. 3. Worsening mediastinal adenopathy, concerning for metastatic adenopathy given the patient's cancer history. Consider PET/CT follow-up. 4. Increased interstitial thickening in the apices and bases, suspected most likely to represent congestive edema, less likely lymphangitic carcinomatosis. Increased cardiomegaly and central venous prominence . 5. Increased atelectasis or consolidation in the base of the lingula and posterior left lower lobe alongside the pleural effusion. 6. Status post right lower lobectomy. 7. Stable appearance of relatively mild esophageal thickening, but increased patulousness of esophagus. Electronically signed by: Francis Quam MD 12/26/2024 06:23 AM EST RP Workstation: [id number]   DG Chest Portable 1 View Result Date: 12/25/2024 EXAM: 1 VIEW(S) XRAY OF THE CHEST 12/25/2024 09:10:00 PM COMPARISON: 10/02/2024 CLINICAL HISTORY: Shortness of breath. FINDINGS: LINES, TUBES AND DEVICES: Right chest port with tip terminating over the SVC. LUNGS AND PLEURA: Small right greater than left pleural effusions. Diffuse interstitial prominence  with more focal airspace opacity in the right mid lung. No pneumothorax. HEART AND MEDIASTINUM: Atherosclerotic calcifications. No acute abnormality of the cardiac and mediastinal silhouettes. BONES AND SOFT TISSUES: No acute osseous abnormality. IMPRESSION: 1.  Central pulmonary vascular congestion. 2. Focal opacity in the inferior right upper lobe. Consider follow-up CT given that this is unresolved from november. 3. Stable right chest port tip projects over the SVC. Electronically signed by: Greig Pique MD 12/25/2024 09:17 PM EST RP Workstation: [id number]       Laneta Blunt, DO [facility] 12/26/2024, 5:56 PM    Dictation software may have been used to generate the above note. Typos may occur and escape review in typed/dictated notes. Please contact Dr Blunt directly for clarity if needed.  Staff may message me via secure chat in Epic  but this may not receive an immediate response,  please page me for urgent matters!  If 7PM-7AM, please contact night coverage [email or web address]       "

## 2024-12-26 NOTE — ED Notes (Signed)
 Fall bundle is in place

## 2024-12-26 NOTE — Assessment & Plan Note (Addendum)
-   The patient will be admitted to a telemetry bed. - She initially required BiPAP and was tapered to nasal cannula. - She will be placed on antibiotic therapy with IV Rocephin . - Bronchodilator therapy will be provided with DuoNebs 4 times daily and every 4 hours as needed. - Steroid therapy will be provided with IV Solu-Medrol . - Mucolytic therapy will be provided. - She has a right upper lobe opacity that will need follow-up chest CT that was ordered

## 2024-12-26 NOTE — ED Notes (Signed)
 Pillow give to pt/ repositioned pt in bed

## 2024-12-26 NOTE — ED Notes (Signed)
 Pt reporting headache. NP aware. See new orders at this time.

## 2024-12-26 NOTE — Hospital Course (Addendum)
 Kelly Krause is a 83 y.o. female with medical history significant for atrial fibrillation, on Eliquis , type II diabetes mellitus, GERD, hypertension comes lipidemia, depression, CVA on lung cancer, who presented to the emergency room 12/25/2024 from home via EMS with  Chief Complaint  Patient presents with   Respiratory Distress    HPI: worsening dyspnea with associated cough productive of yellow sputum as well as wheezing all day today. She denies any fever or chills. Does not wear O2 at baseline, 82% on [facility] arrival. EMS gave 3 duonebs, solumedrol, 2g Magnesium . No c/o CP   Hospital course / significant events: 02/04: to ED. HR 102-->79, RR 25-->22-->19, SpO2 95% 4L/min [place]. VBG no concerns. Troponin flat, 20s. CBC and BMP no concerns other than hyperglycemic 230, COVID/flu/RSV neg. CXR vascular congestion and RUL opacity unresolved from 09/2024. CTA Chest no PE, small bl pleural effusions, worsening mediastinal adenopathy wuestion metastatic consider PET follow up, likely congestive edema apices/bases, cardiomegaly and central venous congestion, s/p RLL lobectomy.  02/05: Lasix  IV x1 Net IO Since Admission: -1,000 mL [12/26/24 1755], continuing tx CAP/COPD 02/06: output under-documented pt reports improvement and good UOP, still SOB on exertion but O2 reassuring        Consultants:  none  Procedures/Surgeries: none      ASSESSMENT & PLAN:   Acute respiratory failure with hypoxia and hypercarbia  COPD vs CHF  No O2 at baseline Bip[place] --> [place] here  Treat underlying cause(s) as below   COPD question exacerbation  RUL lung opacity uncertain clinical significance  O2 supplementation as above  IV Rocephin  DuoNebs 4 times daily and every 4 hours as needed. IV Solu-Medrol .  Meets [place] criteria for CHF unspecified Pulmonary vascular congestion, pleural effusion, cardiomegaly Echo 09/2024 EF 60-65, normal diastolic function, moderate MR.  Question HFpEF d/t HTN urgency    Hypertensive urgency Strict in/out Lasix  40 mg IV x1 yesterday --> 1L out as documented --> repeat Lasix  40 mg bid today    Paroxysmal atrial fibrillation Eliquis   sotalol .   Dyslipidemia CAD statin   Depression Effexor  XR.  GERD PPI   N/a based on BMI: There is no height or weight on file to calculate BMI.. Significantly low or high BMI is associated with higher medical risk.  Underweight - under 18  overweight - 25 to 29 obese - 30 or more Class 1 obesity: BMI of 30.0 to 34 Class 2 obesity: BMI of 35.0 to 39 Class 3 obesity: BMI of 40.0 to 49 Super Morbid Obesity: BMI 50-59 Super-super Morbid Obesity: BMI 60+ Healthy nutrition and physical activity advised as adjunct to other disease management and risk reduction treatments    DVT prophylaxis: eliquis  IV fluids: no continuous IV fluids  Nutrition: cardiac  Central lines / other devices: none  Code Status: FULL CODE ACP documentation reviewed:  none on file in [facility]  TOC needs: TBD Medical barriers to dispo at this time: diuresing, iv abx, o2 requirement . Expected readiness for discharge per TOC at this time:

## 2024-12-26 NOTE — Assessment & Plan Note (Signed)
-   She will be placed on as needed IV labetalol  and hydralazine .

## 2024-12-27 ENCOUNTER — Encounter: Payer: Self-pay | Admitting: Family Medicine

## 2024-12-27 ENCOUNTER — Other Ambulatory Visit: Payer: Self-pay

## 2024-12-27 LAB — BASIC METABOLIC PANEL WITH GFR
Anion gap: 7 (ref 5–15)
BUN: 26 mg/dL — ABNORMAL HIGH (ref 8–23)
CO2: 28 mmol/L (ref 22–32)
Calcium: 8.4 mg/dL — ABNORMAL LOW (ref 8.9–10.3)
Chloride: 102 mmol/L (ref 98–111)
Creatinine, Ser: 0.75 mg/dL (ref 0.44–1.00)
GFR, Estimated: >60 mL/min
Glucose, Bld: 121 mg/dL — ABNORMAL HIGH (ref 70–99)
Potassium: 4.8 mmol/L (ref 3.5–5.1)
Sodium: 137 mmol/L (ref 135–145)

## 2024-12-27 MED ORDER — FUROSEMIDE 10 MG/ML IJ SOLN
40.0000 mg | Freq: Once | INTRAMUSCULAR | Status: AC
  Administered 2024-12-27: 40 mg via INTRAVENOUS
  Filled 2024-12-27: qty 4

## 2024-12-27 MED ORDER — FLUTICASONE PROPIONATE 50 MCG/ACT NA SUSP
2.0000 | Freq: Every day | NASAL | Status: AC
  Administered 2024-12-27: 2 via NASAL
  Filled 2024-12-27: qty 16

## 2024-12-27 NOTE — Progress Notes (Signed)
 " PROGRESS NOTE    Kelly Krause   FMW:[id number] DOB: 29-Oct-1941  DOA: 12/25/2024 Date of Service: 12/27/24 which is hospital day 2  PCP: Bertrum Charlie CROME, MD    Kelly Krause is a 84 y.o. female with medical history significant for atrial fibrillation, on Eliquis , type II diabetes mellitus, GERD, hypertension comes lipidemia, depression, CVA on lung cancer, who presented to the emergency room 12/25/2024 from home via EMS with  Chief Complaint  Patient presents with   Respiratory Distress    HPI: worsening dyspnea with associated cough productive of yellow sputum as well as wheezing all day today. She denies any fever or chills. Does not wear O2 at baseline, 82% on RA EMS arrival. EMS gave 3 duonebs, solumedrol, 2g Magnesium . No c/o CP   Hospital course / significant events: 02/04: to ED. HR 102-->79, RR 25-->22-->19, SpO2 95% 4L/min [place]. VBG no concerns. Troponin flat, 20s. CBC and BMP no concerns other than hyperglycemic 230, COVID/flu/RSV neg. CXR vascular congestion and RUL opacity unresolved from 09/2024. CTA Chest no PE, small bl pleural effusions, worsening mediastinal adenopathy wuestion metastatic consider PET follow up, likely congestive edema apices/bases, cardiomegaly and central venous congestion, s/p RLL lobectomy.  02/05: Lasix  IV x1 Net IO Since Admission: -1,000 mL [12/26/24 1755], continuing tx CAP/COPD 02/06: output under-documented pt reports improvement and good UOP, still SOB on exertion but O2 reassuring        Consultants:  none  Procedures/Surgeries: none      ASSESSMENT & PLAN:   Acute respiratory failure with hypoxia and hypercarbia  COPD vs CHF  No O2 at baseline Bip[place] --> [place] here  Treat underlying cause(s) as below   COPD question exacerbation  RUL lung opacity uncertain clinical significance  O2 supplementation as above  IV Rocephin  DuoNebs 4 times daily and every 4 hours as needed. IV Solu-Medrol .  Meets [place] criteria for CHF  unspecified Pulmonary vascular congestion, pleural effusion, cardiomegaly Echo 09/2024 EF 60-65, normal diastolic function, moderate MR.  Question HFpEF d/t HTN urgency   Hypertensive urgency Strict in/out Lasix  40 mg IV x1 yesterday --> 1L out as documented --> repeat Lasix  40 mg bid today    Paroxysmal atrial fibrillation Eliquis   sotalol .   Dyslipidemia CAD statin   Depression Effexor  XR.  GERD PPI   N/a based on BMI: There is no height or weight on file to calculate BMI.. Significantly low or high BMI is associated with higher medical risk.  Underweight - under 18  overweight - 25 to 29 obese - 30 or more Class 1 obesity: BMI of 30.0 to 34 Class 2 obesity: BMI of 35.0 to 39 Class 3 obesity: BMI of 40.0 to 49 Super Morbid Obesity: BMI 50-59 Super-super Morbid Obesity: BMI 60+ Healthy nutrition and physical activity advised as adjunct to other disease management and risk reduction treatments    DVT prophylaxis: eliquis  IV fluids: no continuous IV fluids  Nutrition: cardiac  Central lines / other devices: none  Code Status: FULL CODE ACP documentation reviewed:  none on file in [facility]  TOC needs: TBD Medical barriers to dispo at this time: diuresing, iv abx, o2 requirement . Expected readiness for discharge per TOC at this time:               Subjective / Brief ROS:  Patient reports no concerns Denies CP/SOB at rest.  Pain controlled.  Denies new weakness.  Tolerating diet .  Reports no concerns w/ urination/defecation.   Family Communication: none at  this time     Objective Findings:  Vitals:   12/27/24 1400 12/27/24 1415 12/27/24 1430 12/27/24 1528  BP: (!) 167/84 (!) 161/81 (!) 172/74 (!) 179/79  Pulse: (!) 59 62 61 60  Resp:    16  Temp:    97.7 F (36.5 C)  TempSrc:    Oral  SpO2: 95% 95% 92%   Weight:    70.3 kg  Height:    5' 7 (1.702 m)    Intake/Output Summary (Last 24 hours) at 12/27/2024 1628 Last data filed at 12/27/2024  0047 Gross per 24 hour  Intake 100 ml  Output --  Net 100 ml   Filed Weights   12/27/24 1528  Weight: 70.3 kg    Examination:  Physical Exam Constitutional:      General: She is not in acute distress. Cardiovascular:     Rate and Rhythm: Normal rate and regular rhythm.  Pulmonary:     Effort: Pulmonary effort is normal. No respiratory distress.     Breath sounds: Decreased air movement present. Rales present.  Musculoskeletal:     Right lower leg: No edema.     Left lower leg: No edema.  Neurological:     Mental Status: She is alert and oriented to person, place, and time. Mental status is at baseline.  Psychiatric:        Mood and Affect: Mood normal.        Behavior: Behavior normal.          Scheduled Medications:   apixaban   5 mg Oral BID   atorvastatin   40 mg Oral Daily   docusate sodium   100 mg Oral BID   pantoprazole   40 mg Oral Daily   predniSONE   40 mg Oral Q breakfast   sotalol   80 mg Oral BID   venlafaxine  XR  150 mg Oral Q breakfast    Continuous Infusions:  cefTRIAXone  (ROCEPHIN )  IV Stopped (12/27/24 0047)    PRN Medications:  acetaminophen  **OR** acetaminophen , labetalol , ondansetron  **OR** ondansetron  (ZOFRAN ) IV, senna-docusate, traZODone   Antimicrobials from admission:  Anti-infectives (From admission, onward)    Start     Dose/Rate Route Frequency Ordered Stop   12/26/24 0100  cefTRIAXone  (ROCEPHIN ) 1 g in sodium chloride  0.9 % 100 mL IVPB        1 g 200 mL/hr over 30 Minutes Intravenous Every 24 hours 12/26/24 0054 12/31/24 0059           Data Reviewed:  I have personally reviewed the following...  CBC: Recent Labs  Lab 12/25/24 2050 12/26/24 0638  WBC 10.3 6.3  NEUTROABS 7.2  --   HGB 13.1 11.4*  HCT 41.0 36.3  MCV 96.5 95.3  PLT 307 251   Basic Metabolic Panel: Recent Labs  Lab 12/25/24 2050 12/26/24 0638 12/27/24 0622  NA 139 139 137  K 3.7 3.8 4.8  CL 100 101 102  CO2 27 26 28   GLUCOSE 230* 174* 121*   BUN 17 15 26*  CREATININE 0.66 0.56 0.75  CALCIUM  8.8* 8.6* 8.4*   GFR: Estimated Creatinine Clearance: 50.9 mL/min (by C-G formula based on SCr of 0.75 mg/dL). Liver Function Tests: No results for input(s): AST, ALT, ALKPHOS, BILITOT, PROT, ALBUMIN in the last 168 hours. No results for input(s): LIPASE, AMYLASE in the last 168 hours. No results for input(s): AMMONIA in the last 168 hours. Coagulation Profile: No results for input(s): INR, PROTIME in the last 168 hours. Cardiac Enzymes: No results for input(s): CKTOTAL,  CKMB, CKMBINDEX, TROPONINI in the last 168 hours. BNP (last 3 results) Recent Labs    10/02/24 1132  PROBNP 4,519.0*   HbA1C: No results for input(s): HGBA1C in the last 72 hours. CBG: No results for input(s): GLUCAP in the last 168 hours. Lipid Profile: No results for input(s): CHOL, HDL, LDLCALC, TRIG, CHOLHDL, LDLDIRECT in the last 72 hours. Thyroid  Function Tests: No results for input(s): TSH, T4TOTAL, FREET4, T3FREE, THYROIDAB in the last 72 hours. Anemia Panel: No results for input(s): VITAMINB12, FOLATE, FERRITIN, TIBC, IRON, RETICCTPCT in the last 72 hours. Most Recent Urinalysis On File:     Component Value Date/Time   COLORURINE YELLOW (A) 07/19/2024 0951   APPEARANCEUR CLEAR (A) 07/19/2024 0951   LABSPEC 1.016 07/19/2024 0951   PHURINE 5.0 07/19/2024 0951   GLUCOSEU NEGATIVE 07/19/2024 0951   HGBUR NEGATIVE 07/19/2024 0951   BILIRUBINUR NEGATIVE 07/19/2024 0951   KETONESUR NEGATIVE 07/19/2024 0951   PROTEINUR NEGATIVE 07/19/2024 0951   NITRITE NEGATIVE 07/19/2024 0951   LEUKOCYTESUR NEGATIVE 07/19/2024 0951   Sepsis Labs: @LABRCNTIP (procalcitonin:4,lacticidven:4) Microbiology: Recent Results (from the past 240 hours)  Resp panel by RT-PCR (RSV, Flu A&B, Covid) Anterior Nasal Swab     Status: None   Collection Time: 12/25/24  8:51 PM   Specimen: Anterior Nasal Swab   Result Value Ref Range Status   SARS Coronavirus 2 by RT PCR NEGATIVE NEGATIVE Final    Comment: (NOTE) SARS-CoV-2 target nucleic acids are NOT DETECTED.  The SARS-CoV-2 RNA is generally detectable in upper respiratory specimens during the acute phase of infection. The lowest concentration of SARS-CoV-2 viral copies this assay can detect is 138 copies/mL. A negative result does not preclude SARS-Cov-2 infection and should not be used as the sole basis for treatment or other patient management decisions. A negative result may occur with  improper specimen collection/handling, submission of specimen other than nasopharyngeal swab, presence of viral mutation(s) within the areas targeted by this assay, and inadequate number of viral copies(<138 copies/mL). A negative result must be combined with clinical observations, patient history, and epidemiological information. The expected result is Negative.  Fact Sheet for Patients:  bloggercourse.com  Fact Sheet for Healthcare Providers:  seriousbroker.it  This test is no t yet approved or cleared by the [place]  FDA and  has been authorized for detection and/or diagnosis of SARS-CoV-2 by FDA under an Emergency Use Authorization (EUA). This EUA will remain  in effect (meaning this test can be used) for the duration of the COVID-19 declaration under Section 564(b)(1) of the Act, 21 U.S.C.section 360bbb-3(b)(1), unless the authorization is terminated  or revoked sooner.       Influenza A by PCR NEGATIVE NEGATIVE Final   Influenza B by PCR NEGATIVE NEGATIVE Final    Comment: (NOTE) The Xpert Xpress SARS-CoV-2/FLU/RSV plus assay is intended as an aid in the diagnosis of influenza from Nasopharyngeal swab specimens and should not be used as a sole basis for treatment. Nasal washings and aspirates are unacceptable for Xpert Xpress SARS-CoV-2/FLU/RSV testing.  Fact Sheet for  Patients: bloggercourse.com  Fact Sheet for Healthcare Providers: seriousbroker.it  This test is not yet approved or cleared by the [place]  FDA and has been authorized for detection and/or diagnosis of SARS-CoV-2 by FDA under an Emergency Use Authorization (EUA). This EUA will remain in effect (meaning this test can be used) for the duration of the COVID-19 declaration under Section 564(b)(1) of the Act, 21 U.S.C. section 360bbb-3(b)(1), unless the authorization is terminated or revoked.  Resp Syncytial Virus by PCR NEGATIVE NEGATIVE Final    Comment: (NOTE) Fact Sheet for Patients: bloggercourse.com  Fact Sheet for Healthcare Providers: seriousbroker.it  This test is not yet approved or cleared by the [place]  FDA and has been authorized for detection and/or diagnosis of SARS-CoV-2 by FDA under an Emergency Use Authorization (EUA). This EUA will remain in effect (meaning this test can be used) for the duration of the COVID-19 declaration under Section 564(b)(1) of the Act, 21 U.S.C. section 360bbb-3(b)(1), unless the authorization is terminated or revoked.  Performed at [facility], [address] [place], [place] [address]       Radiology Studies last 3 days: CT Angio Chest Pulmonary Embolism (PE) W or WO Contrast Result Date: 12/26/2024 EXAM: CTA of the Chest with contrast for PE 12/26/2024 05:48:04 AM TECHNIQUE: CTA of the chest was performed after the administration of 100 mL of iohexol  (OMNIPAQUE ) 350 MG/ML injection. Multiplanar reformatted images are provided for review. MIP images are provided for review. Automated exposure control, iterative reconstruction, and/or weight based adjustment of the mA/kV was utilized to reduce the radiation dose to as low as reasonably achievable. COMPARISON: Portable chest 12/25/2024, portable chest 10/02/2024, rib  series with AP chest 08/19/2023, and chest abdomen and pelvis CT with IV contrast 10/03/2017. CLINICAL HISTORY: Pulmonary embolism (PE) suspected, low to intermediate probability, positive D-dimer; right upper lobe lung mass. Unresolved opacity right mid lung since last November as compared to older films, CT was recommended. Reportedly, there is a history of lung cancer and esophageal cancer, with colon cancer diagnosed in 2018. FINDINGS: PULMONARY ARTERIES: Pulmonary arteries are adequately opacified for evaluation. Central pulmonary arteries are slightly prominent. No pulmonary embolism. Main pulmonary artery is normal in caliber. MEDIASTINUM: The heart is moderately enlarged compared to 2018. 3-vessel coronary artery calcifications are again noted. Atherosclerosis in the aorta and great vessels without aneurysm, dissection, or stenosis. The central pulmonary veins are prominent especially the superior pulmonary veins. There is a right chest IJ approach port catheter with its tip in the mid to distal SVC. Mild thickening in the esophageal wall appears similar to before, but with increased patulous appearance of the esophagus and scattered fluid in the lumen. LYMPH NODES: There is worsening mediastinal adenopathy. There is a right paratracheal lymph node on series 5 axial 47 now measuring 2 x 1.6 cm; in 2018, it was 9 x 8 mm. Just above this is an enlarged right paratracheal node measuring 1.4 x 1.1 cm, formerly 6 x 5 mm. To the left of the aortic arch there is a 1.7 x 1.7 cm node which was previously 10 x 8 mm. There are borderline prominent bilateral hilar lymph nodes, which were also previously smaller, measuring 8 mm in short axis on axial 66, 9 mm on axial 62. Axillary regions are clear. LUNGS AND PLEURA: There is increased interstitial thickening in the apices and bases, suspected most likely to represent congestive edema, less likely lymphangitic carcinomatosis. There is mosaic attenuation in the lung  fields, which is probably due to air trapping. Ground-glass edema component is not fully excluded. There is evidence of old right lower lobectomy. Right paramediastinal surgical clips in the lower chest are again seen. There is increased atelectasis or consolidation in the lingular base and in the left lower lobe alongside the effusion. I do not see any lung nodules. There is a small layering left pleural effusion. There is also a small pleural effusion on the right, with loculated fluid in the  fissure corresponding to the right mid chest opacity noted on the recent chest films. Some of the right pleural fluid is partially loculated towards the lateral lower mid and lower thorax with areas of calcified pleural thickening again noted along the superior and posterior right hemidiaphragm. No pneumothorax. UPPER ABDOMEN: Limited images of the upper abdomen are unremarkable. SOFT TISSUES AND BONES: There is osteopenia and thoracic kyphosis; interbody ankylosis at 3 contiguous mid thoracic levels. No destructive bone lesions. No acute soft tissue abnormality. IMPRESSION: 1. No evidence of pulmonary embolism. 2. Small bilateral pleural effusions, with loculated fluid in the right fissure corresponding to the right mid chest opacity noted on recent chest films. 3. Worsening mediastinal adenopathy, concerning for metastatic adenopathy given the patient's cancer history. Consider PET/CT follow-up. 4. Increased interstitial thickening in the apices and bases, suspected most likely to represent congestive edema, less likely lymphangitic carcinomatosis. Increased cardiomegaly and central venous prominence . 5. Increased atelectasis or consolidation in the base of the lingula and posterior left lower lobe alongside the pleural effusion. 6. Status post right lower lobectomy. 7. Stable appearance of relatively mild esophageal thickening, but increased patulousness of esophagus. Electronically signed by: Francis Quam MD 12/26/2024  06:23 AM EST RP Workstation: [id number]   DG Chest Portable 1 View Result Date: 12/25/2024 EXAM: 1 VIEW(S) XRAY OF THE CHEST 12/25/2024 09:10:00 PM COMPARISON: 10/02/2024 CLINICAL HISTORY: Shortness of breath. FINDINGS: LINES, TUBES AND DEVICES: Right chest port with tip terminating over the SVC. LUNGS AND PLEURA: Small right greater than left pleural effusions. Diffuse interstitial prominence with more focal airspace opacity in the right mid lung. No pneumothorax. HEART AND MEDIASTINUM: Atherosclerotic calcifications. No acute abnormality of the cardiac and mediastinal silhouettes. BONES AND SOFT TISSUES: No acute osseous abnormality. IMPRESSION: 1.  Central pulmonary vascular congestion. 2. Focal opacity in the inferior right upper lobe. Consider follow-up CT given that this is unresolved from november. 3. Stable right chest port tip projects over the SVC. Electronically signed by: Greig Pique MD 12/25/2024 09:17 PM EST RP Workstation: [id number]       Laneta Blunt, DO [facility] 12/27/2024, 4:28 PM    Dictation software may have been used to generate the above note. Typos may occur and escape review in typed/dictated notes. Please contact Dr Blunt directly for clarity if needed.  Staff may message me via secure chat in Epic  but this may not receive an immediate response,  please page me for urgent matters!  If 7PM-7AM, please contact night coverage [email or web address]       "

## 2024-12-27 NOTE — Evaluation (Signed)
 Physical Therapy Evaluation Patient Details Name: Kelly Krause MRN: [id number] DOB: 10-18-1941 Today's Date: 12/27/2024  History of Present Illness  Pt is an 84 y/o F admitted on 12/25/24 after presenting with c/o wheezing. Pt is being treated for acute respiratory failure with hypoxia & hypercarbia, COPD vs CHF. PMH: a-fib, DM2, GERD, HTN, depression, CVA, colon CA, cervical DDD, esophageal CA, HLD, lung CA, MDD  Clinical Impression  Pt seen for PT evaluation with pt agreeable to tx. Pt reports prior to admission she was mod I with rollator or RW, denies falls in the past 6 months, lives alone. On this date, pt is able to ambulate with standard walker with supervision, HHA with CGA. Recommend ongoing PT services to progress mobility as able.        If plan is discharge home, recommend the following: A little help with walking and/or transfers;A little help with bathing/dressing/bathroom;Assistance with cooking/housework;Assist for transportation;Help with stairs or ramp for entrance   Can travel by private vehicle        Equipment Recommendations None recommended by PT  Recommendations for Other Services       Functional Status Assessment Patient has had a recent decline in their functional status and demonstrates the ability to make significant improvements in function in a reasonable and predictable amount of time.     Precautions / Restrictions Precautions Precautions: Fall Restrictions Weight Bearing Restrictions Per Provider Order: No      Mobility  Bed Mobility Overal bed mobility: Modified Independent, Needs Assistance Bed Mobility: Supine to Sit, Sit to Supine     Supine to sit: Modified independent (Device/Increase time), HOB elevated Sit to supine: Modified independent (Device/Increase time), HOB elevated        Transfers Overall transfer level: Needs assistance Equipment used: Rolling walker (2 wheels) Transfers: Sit to/from Stand Sit to Stand: Supervision            General transfer comment: transferred sit>stand from EOB, low toilet with extra time to power up to standing & cuing to use grab bar    Ambulation/Gait Ambulation/Gait assistance: Contact guard assist, Supervision Gait Distance (Feet): 5 Feet (+ 10 ft + 150 ft) Assistive device: Standard walker, 1 person hand held assist Gait Pattern/deviations: Decreased step length - right, Decreased step length - left, Decreased stride length Gait velocity: decreased     General Gait Details: Pt ambulated 5 ft + 10 ft with standard walker with supervision, transitioned to RUE HHA 2/2 not having RW & standard walker too short for pt. Pt ambulated with RUE HHA CGA.  Stairs            Wheelchair Mobility     Tilt Bed    Modified Rankin (Stroke Patients Only)       Balance Overall balance assessment: Needs assistance Sitting-balance support: Feet supported Sitting balance-Leahy Scale: Good     Standing balance support: During functional activity, Bilateral upper extremity supported, Reliant on assistive device for balance Standing balance-Leahy Scale: Fair                               Pertinent Vitals/Pain Pain Assessment Pain Assessment: No/denies pain    Home Living Family/patient expects to be discharged to:: Private residence Living Arrangements: Alone   Type of Home: House Home Access: Stairs to enter Entrance Stairs-Rails: None Entrance Stairs-Number of Steps: 1   Home Layout: One level Home Equipment: Pharmacist, Hospital (2 wheels);Cane - single  point;Rollator (4 wheels)      Prior Function Prior Level of Function : Independent/Modified Independent             Mobility Comments: Ambulatory with rollator or RW, denies falls in the past 6 months, driving. ADLs Comments: reports independence     Extremity/Trunk Assessment   Upper Extremity Assessment Upper Extremity Assessment: Overall WFL for tasks assessed    Lower  Extremity Assessment Lower Extremity Assessment: Overall WFL for tasks assessed;Generalized weakness       Communication   Communication Communication: Impaired Factors Affecting Communication: Hearing impaired    Cognition Arousal: Alert Behavior During Therapy: WFL for tasks assessed/performed   PT - Cognitive impairments: No apparent impairments                         Following commands: Intact       Cueing Cueing Techniques: Verbal cues     General Comments General comments (skin integrity, edema, etc.): continent void on toilet, SPO2 reading as low as 79% with pt reporting slight SOB but question accuracy of reading as O2 reading increased to >/= 90% with accurate pleth    Exercises     Assessment/Plan    PT Assessment Patient needs continued PT services  PT Problem List Decreased strength;Cardiopulmonary status limiting activity;Decreased activity tolerance;Decreased balance;Decreased mobility;Decreased knowledge of precautions;Decreased safety awareness;Decreased knowledge of use of DME       PT Treatment Interventions Therapeutic exercise;DME instruction;Gait training;Balance training;Stair training;Neuromuscular re-education;Functional mobility training;Therapeutic activities;Patient/family education    PT Goals (Current goals can be found in the Care Plan section)  Acute Rehab PT Goals Patient Stated Goal: get better PT Goal Formulation: With patient Time For Goal Achievement: 01/10/25 Potential to Achieve Goals: Good    Frequency Min 2X/week     Co-evaluation               AM-PAC PT 6 Clicks Mobility  Outcome Measure Help needed turning from your back to your side while in a flat bed without using bedrails?: None Help needed moving from lying on your back to sitting on the side of a flat bed without using bedrails?: None Help needed moving to and from a bed to a chair (including a wheelchair)?: A Little Help needed standing up from  a chair using your arms (e.g., wheelchair or bedside chair)?: A Little Help needed to walk in hospital room?: A Little Help needed climbing 3-5 steps with a railing? : A Little 6 Click Score: 20    End of Session   Activity Tolerance: Patient tolerated treatment well Patient left: in bed;with call bell/phone within reach;with bed alarm set Nurse Communication: Mobility status (O2) PT Visit Diagnosis: Unsteadiness on feet (R26.81);Muscle weakness (generalized) (M62.81)    Time: 8678-8663 PT Time Calculation (min) (ACUTE ONLY): 15 min   Charges:   PT Evaluation $PT Eval Low Complexity: 1 Low   PT General Charges $$ ACUTE PT VISIT: 1 Visit         Richerd Pinal, PT, DPT 12/27/24, 1:48 PM   Richerd CHRISTELLA Pinal 12/27/2024, 1:47 PM

## 2024-12-27 NOTE — ED Notes (Signed)
 Pt ambulatory to bathroom in room with walker. Pt placed on pulse o2 monitoring during ambulation. Pt maintained saturation of 94-97% on room air with ambulation. MD Marsa notified via secure chat.

## 2024-12-27 NOTE — Evaluation (Addendum)
 Occupational Therapy Evaluation Patient Details Name: Kelly Krause MRN: [id number] DOB: 1941-05-03 Today's Date: 12/27/2024   History of Present Illness   Pt is an 84 y/o F admitted on 12/25/24 after presenting with c/o wheezing. Pt is being treated for acute respiratory failure with hypoxia & hypercarbia, COPD vs CHF. PMH: a-fib, DM2, GERD, HTN, depression, CVA, colon CA, cervical DDD, esophageal CA, HLD, lung CA, MDD     Clinical Impressions Pt was seen for OT evaluation this date. PTA, pt resides at home alone and is indep with ADLs. Reports use of SPC for household mobility and RW for community distances. Pt is having mild SOB with activity and currently requires supervision + RW for ambulation, as well as for UB/LB bathing, dressing and all aspects of toileting seated on low commode in ED. Reports her toilet at home is higher and she denies need for acute OT services. Sp02 on RA throughout session with desat to 85% however quickly improved to 91% with good pleth and pt did not appear SOB with activity during session. Will sign off in house for OT services, but recommend continued mobility during hospitalization to prevent decline. No OT follow up indicated.     If plan is discharge home, recommend the following:   A little help with walking and/or transfers;Assist for transportation;Help with stairs or ramp for entrance     Functional Status Assessment   Patient has had a recent decline in their functional status and demonstrates the ability to make significant improvements in function in a reasonable and predictable amount of time.     Equipment Recommendations   None recommended by OT     Recommendations for Other Services         Precautions/Restrictions   Precautions Precautions: Fall Recall of Precautions/Restrictions: Intact Restrictions Weight Bearing Restrictions Per Provider Order: No     Mobility Bed Mobility Overal bed mobility: Modified Independent                   Transfers Overall transfer level: Needs assistance Equipment used: Rolling walker (2 wheels) Transfers: Sit to/from Stand Sit to Stand: Supervision           General transfer comment: no difficulty standing from toilet during OT session; reports her toilet is slightly taller at home      Balance Overall balance assessment: Needs assistance Sitting-balance support: Feet supported Sitting balance-Leahy Scale: Normal     Standing balance support: During functional activity, Bilateral upper extremity supported, Reliant on assistive device for balance Standing balance-Leahy Scale: Fair Standing balance comment: RW                           ADL either performed or assessed with clinical judgement   ADL Overall ADL's : Modified independent                                       General ADL Comments: set up  to supervision assist for seated bathing on toilet, gown change, and all aspects of toileting     Vision         Perception         Praxis         Pertinent Vitals/Pain Pain Assessment Pain Assessment: No/denies pain     Extremity/Trunk Assessment Upper Extremity Assessment Upper Extremity Assessment: Overall WFL for tasks assessed  Lower Extremity Assessment Lower Extremity Assessment: Overall WFL for tasks assessed;Generalized weakness       Communication Communication Communication: Impaired Factors Affecting Communication: Hearing impaired   Cognition Arousal: Alert Behavior During Therapy: WFL for tasks assessed/performed                                 Following commands: Intact       Cueing  General Comments   Cueing Techniques: Verbal cues  sp02 drop to 85% on tele, however bad plet, up to 91% quickly with good pleth; pt did not appear SOB   Exercises Other Exercises Other Exercises: Edu on role of OT in acute setting.   Shoulder Instructions      Home Living  Family/patient expects to be discharged to:: Private residence Living Arrangements: Alone   Type of Home: House Home Access: Stairs to enter [employer] of Steps: 1 Entrance Stairs-Rails: None Home Layout: One level     Bathroom Shower/Tub: Producer, Television/film/video: Handicapped height     Home Equipment: Pharmacist, Hospital (2 wheels);Cane - single point;Rollator (4 wheels)          Prior Functioning/Environment Prior Level of Function : Independent/Modified Independent             Mobility Comments: Ambulatory with rollator or RW, denies falls in the past 6 months, driving. ADLs Comments: reports independence    OT Problem List:     OT Treatment/Interventions:        OT Goals(Current goals can be found in the care plan section)       OT Frequency:       Co-evaluation              AM-PAC OT 6 Clicks Daily Activity     Outcome Measure Help from another person eating meals?: None Help from another person taking care of personal grooming?: None Help from another person toileting, which includes using toliet, bedpan, or urinal?: None Help from another person bathing (including washing, rinsing, drying)?: None Help from another person to put on and taking off regular upper body clothing?: None Help from another person to put on and taking off regular lower body clothing?: None 6 Click Score: 24   End of Session Equipment Utilized During Treatment: Rolling walker (2 wheels) Nurse Communication: Mobility status  Activity Tolerance: Patient tolerated treatment well Patient left: in bed;with call bell/phone within reach;with bed alarm set;with nursing/sitter in room                   Time: 1447-1512 OT Time Calculation (min): 25 min Charges:  OT General Charges $OT Visit: 1 Visit OT Evaluation $OT Eval Low Complexity: 1 Low OT Treatments $Self Care/Home Management : 8-22 mins Hector Venne Chrismon, OTR/L  12/27/24, 3:47  PM  Risa Auman E Chrismon 12/27/2024, 3:44 PM
